# Patient Record
Sex: Female | Born: 1945
Health system: Southern US, Community
[De-identification: ages and names within clinical notes are randomized; demographics above are authoritative.]

## PROBLEM LIST (undated history)

## (undated) DIAGNOSIS — J449 Chronic obstructive pulmonary disease, unspecified: Secondary | ICD-10-CM

## (undated) DIAGNOSIS — E7800 Pure hypercholesterolemia, unspecified: Secondary | ICD-10-CM

## (undated) DIAGNOSIS — M797 Fibromyalgia: Secondary | ICD-10-CM

## (undated) DIAGNOSIS — J84112 Idiopathic pulmonary fibrosis: Secondary | ICD-10-CM

## (undated) DIAGNOSIS — T8859XA Other complications of anesthesia, initial encounter: Secondary | ICD-10-CM

## (undated) DIAGNOSIS — I639 Cerebral infarction, unspecified: Secondary | ICD-10-CM

## (undated) DIAGNOSIS — K5790 Diverticulosis of intestine, part unspecified, without perforation or abscess without bleeding: Secondary | ICD-10-CM

## (undated) DIAGNOSIS — R011 Cardiac murmur, unspecified: Secondary | ICD-10-CM

## (undated) DIAGNOSIS — T4145XA Adverse effect of unspecified anesthetic, initial encounter: Secondary | ICD-10-CM

## (undated) DIAGNOSIS — H269 Unspecified cataract: Secondary | ICD-10-CM

## (undated) DIAGNOSIS — F32A Depression, unspecified: Secondary | ICD-10-CM

## (undated) DIAGNOSIS — H409 Unspecified glaucoma: Secondary | ICD-10-CM

## (undated) DIAGNOSIS — Z5189 Encounter for other specified aftercare: Secondary | ICD-10-CM

## (undated) DIAGNOSIS — IMO0002 Reserved for concepts with insufficient information to code with codable children: Secondary | ICD-10-CM

## (undated) DIAGNOSIS — I10 Essential (primary) hypertension: Secondary | ICD-10-CM

## (undated) DIAGNOSIS — M1990 Unspecified osteoarthritis, unspecified site: Secondary | ICD-10-CM

## (undated) DIAGNOSIS — N189 Chronic kidney disease, unspecified: Secondary | ICD-10-CM

## (undated) DIAGNOSIS — IMO0001 Reserved for inherently not codable concepts without codable children: Secondary | ICD-10-CM

## (undated) DIAGNOSIS — K224 Dyskinesia of esophagus: Secondary | ICD-10-CM

## (undated) DIAGNOSIS — K219 Gastro-esophageal reflux disease without esophagitis: Secondary | ICD-10-CM

## (undated) DIAGNOSIS — B029 Zoster without complications: Secondary | ICD-10-CM

## (undated) DIAGNOSIS — F329 Major depressive disorder, single episode, unspecified: Secondary | ICD-10-CM

## (undated) DIAGNOSIS — T7840XA Allergy, unspecified, initial encounter: Secondary | ICD-10-CM

## (undated) DIAGNOSIS — F419 Anxiety disorder, unspecified: Secondary | ICD-10-CM

## (undated) DIAGNOSIS — K5792 Diverticulitis of intestine, part unspecified, without perforation or abscess without bleeding: Secondary | ICD-10-CM

## (undated) DIAGNOSIS — G4730 Sleep apnea, unspecified: Secondary | ICD-10-CM

## (undated) DIAGNOSIS — Z148 Genetic carrier of other disease: Secondary | ICD-10-CM

## (undated) DIAGNOSIS — D649 Anemia, unspecified: Secondary | ICD-10-CM

## (undated) DIAGNOSIS — R799 Abnormal finding of blood chemistry, unspecified: Secondary | ICD-10-CM

## (undated) DIAGNOSIS — J45909 Unspecified asthma, uncomplicated: Secondary | ICD-10-CM

## (undated) HISTORY — DX: Pure hypercholesterolemia, unspecified: E78.00

## (undated) HISTORY — DX: Reserved for concepts with insufficient information to code with codable children: IMO0002

## (undated) HISTORY — DX: Dyskinesia of esophagus: K22.4

## (undated) HISTORY — PX: GASTRIC FUNDOPLICATION: SHX226

## (undated) HISTORY — DX: Fibromyalgia: M79.7

## (undated) HISTORY — DX: Diverticulosis of intestine, part unspecified, without perforation or abscess without bleeding: K57.90

## (undated) HISTORY — PX: TONSILLECTOMY: SUR1361

## (undated) HISTORY — DX: Encounter for other specified aftercare: Z51.89

## (undated) HISTORY — PX: UPPER GASTROINTESTINAL ENDOSCOPY: SHX188

## (undated) HISTORY — DX: Allergy, unspecified, initial encounter: T78.40XA

## (undated) HISTORY — DX: Gastro-esophageal reflux disease without esophagitis: K21.9

## (undated) HISTORY — DX: Chronic obstructive pulmonary disease, unspecified: J44.9

## (undated) HISTORY — DX: Depression, unspecified: F32.A

## (undated) HISTORY — DX: Chronic kidney disease, unspecified: N18.9

## (undated) HISTORY — DX: Cardiac murmur, unspecified: R01.1

## (undated) HISTORY — DX: Anemia, unspecified: D64.9

## (undated) HISTORY — PX: COLONOSCOPY: SHX174

## (undated) HISTORY — DX: Idiopathic pulmonary fibrosis: J84.112

## (undated) HISTORY — DX: Anxiety disorder, unspecified: F41.9

## (undated) HISTORY — PX: DE QUERVAIN'S RELEASE: SHX1439

## (undated) HISTORY — PX: ANTERIOR FUSION CERVICAL SPINE: SUR626

## (undated) HISTORY — DX: Unspecified cataract: H26.9

## (undated) HISTORY — DX: Unspecified glaucoma: H40.9

## (undated) HISTORY — PX: BACK SURGERY: SHX140

## (undated) HISTORY — PX: KNEE ARTHROSCOPY: SHX127

## (undated) HISTORY — DX: Major depressive disorder, single episode, unspecified: F32.9

## (undated) HISTORY — PX: HERNIA REPAIR: SHX51

## (undated) HISTORY — PX: CARPAL TUNNEL RELEASE: SHX101

## (undated) HISTORY — DX: Diverticulitis of intestine, part unspecified, without perforation or abscess without bleeding: K57.92

## (undated) HISTORY — PX: ABDOMINAL HYSTERECTOMY: SHX81

## (undated) HISTORY — DX: Unspecified asthma, uncomplicated: J45.909

## (undated) HISTORY — PX: APPENDECTOMY: SHX54

## (undated) HISTORY — DX: Unspecified osteoarthritis, unspecified site: M19.90

---

## 1998-06-24 ENCOUNTER — Ambulatory Visit: Admission: RE | Admit: 1998-06-24 | Discharge: 1998-06-24 | Payer: Self-pay | Admitting: Family Medicine

## 1998-07-27 ENCOUNTER — Ambulatory Visit: Admission: RE | Admit: 1998-07-27 | Discharge: 1998-07-27 | Payer: Self-pay | Admitting: Otolaryngology

## 1998-08-07 ENCOUNTER — Encounter: Payer: Self-pay | Admitting: Internal Medicine

## 1998-08-07 ENCOUNTER — Ambulatory Visit (HOSPITAL_COMMUNITY): Admission: RE | Admit: 1998-08-07 | Discharge: 1998-08-07 | Payer: Self-pay | Admitting: Internal Medicine

## 1998-09-22 ENCOUNTER — Ambulatory Visit (HOSPITAL_COMMUNITY): Admission: RE | Admit: 1998-09-22 | Discharge: 1998-09-22 | Payer: Self-pay | Admitting: Internal Medicine

## 1999-03-23 ENCOUNTER — Encounter: Payer: Self-pay | Admitting: General Surgery

## 1999-03-26 ENCOUNTER — Inpatient Hospital Stay (HOSPITAL_COMMUNITY): Admission: RE | Admit: 1999-03-26 | Discharge: 1999-03-28 | Payer: Self-pay | Admitting: General Surgery

## 1999-04-07 ENCOUNTER — Other Ambulatory Visit: Admission: RE | Admit: 1999-04-07 | Discharge: 1999-04-07 | Payer: Self-pay | Admitting: Obstetrics and Gynecology

## 1999-08-05 ENCOUNTER — Ambulatory Visit (HOSPITAL_BASED_OUTPATIENT_CLINIC_OR_DEPARTMENT_OTHER): Admission: RE | Admit: 1999-08-05 | Discharge: 1999-08-05 | Payer: Self-pay | Admitting: Oral Surgery

## 2000-01-21 ENCOUNTER — Ambulatory Visit (HOSPITAL_COMMUNITY): Admission: RE | Admit: 2000-01-21 | Discharge: 2000-01-21 | Payer: Self-pay | Admitting: Gastroenterology

## 2000-01-21 ENCOUNTER — Encounter: Payer: Self-pay | Admitting: Gastroenterology

## 2000-05-05 ENCOUNTER — Ambulatory Visit (HOSPITAL_COMMUNITY): Admission: RE | Admit: 2000-05-05 | Discharge: 2000-05-05 | Payer: Self-pay | Admitting: Internal Medicine

## 2000-08-04 ENCOUNTER — Encounter: Admission: RE | Admit: 2000-08-04 | Discharge: 2000-08-04 | Payer: Self-pay | Admitting: Obstetrics and Gynecology

## 2000-08-04 ENCOUNTER — Encounter: Payer: Self-pay | Admitting: Obstetrics and Gynecology

## 2000-08-14 ENCOUNTER — Other Ambulatory Visit: Admission: RE | Admit: 2000-08-14 | Discharge: 2000-08-14 | Payer: Self-pay | Admitting: Obstetrics and Gynecology

## 2000-08-17 ENCOUNTER — Encounter: Admission: RE | Admit: 2000-08-17 | Discharge: 2000-08-17 | Payer: Self-pay | Admitting: Obstetrics and Gynecology

## 2000-08-17 ENCOUNTER — Encounter: Payer: Self-pay | Admitting: Obstetrics and Gynecology

## 2000-09-21 ENCOUNTER — Other Ambulatory Visit: Admission: RE | Admit: 2000-09-21 | Discharge: 2000-09-21 | Payer: Self-pay | Admitting: Obstetrics and Gynecology

## 2000-09-21 ENCOUNTER — Encounter (INDEPENDENT_AMBULATORY_CARE_PROVIDER_SITE_OTHER): Payer: Self-pay

## 2001-06-13 ENCOUNTER — Encounter: Payer: Self-pay | Admitting: Neurosurgery

## 2001-06-13 ENCOUNTER — Encounter: Admission: RE | Admit: 2001-06-13 | Discharge: 2001-06-13 | Payer: Self-pay | Admitting: Neurosurgery

## 2001-06-27 ENCOUNTER — Encounter: Payer: Self-pay | Admitting: Neurosurgery

## 2001-06-27 ENCOUNTER — Encounter: Admission: RE | Admit: 2001-06-27 | Discharge: 2001-06-27 | Payer: Self-pay | Admitting: Neurosurgery

## 2001-07-11 ENCOUNTER — Encounter: Payer: Self-pay | Admitting: Neurosurgery

## 2001-07-11 ENCOUNTER — Encounter: Admission: RE | Admit: 2001-07-11 | Discharge: 2001-07-11 | Payer: Self-pay | Admitting: Neurosurgery

## 2001-09-13 ENCOUNTER — Encounter: Admission: RE | Admit: 2001-09-13 | Discharge: 2001-09-13 | Payer: Self-pay | Admitting: Infectious Diseases

## 2001-10-30 ENCOUNTER — Other Ambulatory Visit: Admission: RE | Admit: 2001-10-30 | Discharge: 2001-10-30 | Payer: Self-pay | Admitting: Obstetrics and Gynecology

## 2002-01-01 ENCOUNTER — Encounter: Admission: RE | Admit: 2002-01-01 | Discharge: 2002-01-01 | Payer: Self-pay | Admitting: Otolaryngology

## 2002-01-01 ENCOUNTER — Encounter: Payer: Self-pay | Admitting: Otolaryngology

## 2002-03-25 ENCOUNTER — Encounter: Payer: Self-pay | Admitting: Neurosurgery

## 2002-03-28 ENCOUNTER — Ambulatory Visit (HOSPITAL_COMMUNITY): Admission: RE | Admit: 2002-03-28 | Discharge: 2002-03-28 | Payer: Self-pay | Admitting: Neurosurgery

## 2002-03-28 ENCOUNTER — Encounter: Payer: Self-pay | Admitting: Neurosurgery

## 2002-05-06 ENCOUNTER — Encounter: Payer: Self-pay | Admitting: Neurosurgery

## 2002-05-06 ENCOUNTER — Encounter: Admission: RE | Admit: 2002-05-06 | Discharge: 2002-05-06 | Payer: Self-pay | Admitting: Neurosurgery

## 2002-06-03 ENCOUNTER — Encounter: Admission: RE | Admit: 2002-06-03 | Discharge: 2002-06-03 | Payer: Self-pay | Admitting: Neurosurgery

## 2002-06-03 ENCOUNTER — Encounter: Payer: Self-pay | Admitting: Neurosurgery

## 2002-09-16 ENCOUNTER — Encounter: Payer: Self-pay | Admitting: Neurosurgery

## 2002-09-16 ENCOUNTER — Encounter: Admission: RE | Admit: 2002-09-16 | Discharge: 2002-09-16 | Payer: Self-pay | Admitting: Neurosurgery

## 2003-01-01 ENCOUNTER — Other Ambulatory Visit: Admission: RE | Admit: 2003-01-01 | Discharge: 2003-01-01 | Payer: Self-pay | Admitting: Obstetrics and Gynecology

## 2003-02-10 ENCOUNTER — Encounter: Payer: Self-pay | Admitting: Emergency Medicine

## 2003-02-10 ENCOUNTER — Emergency Department (HOSPITAL_COMMUNITY): Admission: EM | Admit: 2003-02-10 | Discharge: 2003-02-10 | Payer: Self-pay

## 2003-09-19 ENCOUNTER — Emergency Department (HOSPITAL_COMMUNITY): Admission: EM | Admit: 2003-09-19 | Discharge: 2003-09-20 | Payer: Self-pay | Admitting: Emergency Medicine

## 2004-01-09 ENCOUNTER — Encounter: Admission: RE | Admit: 2004-01-09 | Discharge: 2004-01-09 | Payer: Self-pay | Admitting: Obstetrics and Gynecology

## 2005-04-22 ENCOUNTER — Other Ambulatory Visit: Admission: RE | Admit: 2005-04-22 | Discharge: 2005-04-22 | Payer: Self-pay | Admitting: Obstetrics and Gynecology

## 2005-04-28 ENCOUNTER — Encounter: Admission: RE | Admit: 2005-04-28 | Discharge: 2005-04-28 | Payer: Self-pay | Admitting: Obstetrics and Gynecology

## 2005-05-31 ENCOUNTER — Ambulatory Visit (HOSPITAL_BASED_OUTPATIENT_CLINIC_OR_DEPARTMENT_OTHER): Admission: RE | Admit: 2005-05-31 | Discharge: 2005-05-31 | Payer: Self-pay | Admitting: Urology

## 2005-05-31 ENCOUNTER — Ambulatory Visit (HOSPITAL_COMMUNITY): Admission: RE | Admit: 2005-05-31 | Discharge: 2005-05-31 | Payer: Self-pay | Admitting: Urology

## 2006-03-01 ENCOUNTER — Encounter: Admission: RE | Admit: 2006-03-01 | Discharge: 2006-03-01 | Payer: Self-pay | Admitting: General Surgery

## 2006-03-01 DIAGNOSIS — K224 Dyskinesia of esophagus: Secondary | ICD-10-CM

## 2006-03-01 HISTORY — DX: Dyskinesia of esophagus: K22.4

## 2006-03-13 ENCOUNTER — Ambulatory Visit (HOSPITAL_COMMUNITY): Admission: RE | Admit: 2006-03-13 | Discharge: 2006-03-13 | Payer: Self-pay | Admitting: Internal Medicine

## 2006-03-27 ENCOUNTER — Encounter: Admission: RE | Admit: 2006-03-27 | Discharge: 2006-03-27 | Payer: Self-pay | Admitting: General Surgery

## 2006-04-04 ENCOUNTER — Ambulatory Visit: Payer: Self-pay | Admitting: Internal Medicine

## 2006-05-11 ENCOUNTER — Ambulatory Visit (HOSPITAL_COMMUNITY): Admission: RE | Admit: 2006-05-11 | Discharge: 2006-05-11 | Payer: Self-pay | Admitting: General Surgery

## 2007-11-05 ENCOUNTER — Emergency Department (HOSPITAL_COMMUNITY): Admission: EM | Admit: 2007-11-05 | Discharge: 2007-11-05 | Payer: Self-pay | Admitting: *Deleted

## 2007-12-26 ENCOUNTER — Emergency Department (HOSPITAL_COMMUNITY): Admission: EM | Admit: 2007-12-26 | Discharge: 2007-12-26 | Payer: Self-pay | Admitting: Emergency Medicine

## 2008-11-14 DIAGNOSIS — I639 Cerebral infarction, unspecified: Secondary | ICD-10-CM

## 2008-11-14 HISTORY — DX: Cerebral infarction, unspecified: I63.9

## 2010-05-19 ENCOUNTER — Encounter (INDEPENDENT_AMBULATORY_CARE_PROVIDER_SITE_OTHER): Payer: Self-pay | Admitting: *Deleted

## 2010-11-17 ENCOUNTER — Emergency Department (HOSPITAL_COMMUNITY)
Admission: EM | Admit: 2010-11-17 | Discharge: 2010-11-17 | Payer: Self-pay | Source: Home / Self Care | Admitting: Emergency Medicine

## 2010-11-17 LAB — DIFFERENTIAL
Basophils Absolute: 0.1 10*3/uL (ref 0.0–0.1)
Basophils Relative: 1 % (ref 0–1)
Eosinophils Absolute: 0.3 10*3/uL (ref 0.0–0.7)
Eosinophils Relative: 3 % (ref 0–5)
Lymphocytes Relative: 39 % (ref 12–46)
Lymphs Abs: 3.6 10*3/uL (ref 0.7–4.0)
Monocytes Absolute: 0.7 10*3/uL (ref 0.1–1.0)
Monocytes Relative: 8 % (ref 3–12)
Neutro Abs: 4.6 10*3/uL (ref 1.7–7.7)
Neutrophils Relative %: 49 % (ref 43–77)

## 2010-11-17 LAB — COMPREHENSIVE METABOLIC PANEL
ALT: 19 U/L (ref 0–35)
AST: 20 U/L (ref 0–37)
Albumin: 3.7 g/dL (ref 3.5–5.2)
Alkaline Phosphatase: 100 U/L (ref 39–117)
BUN: 12 mg/dL (ref 6–23)
CO2: 25 mEq/L (ref 19–32)
Calcium: 9.3 mg/dL (ref 8.4–10.5)
Chloride: 111 mEq/L (ref 96–112)
Creatinine, Ser: 0.83 mg/dL (ref 0.4–1.2)
GFR calc Af Amer: >60 mL/min (ref >60)
GFR calc non Af Amer: >60 mL/min (ref >60)
Glucose, Bld: 128 mg/dL — ABNORMAL HIGH (ref 70–99)
Potassium: 3.9 mEq/L (ref 3.5–5.1)
Sodium: 142 mEq/L (ref 135–145)
Total Bilirubin: 0.2 mg/dL — ABNORMAL LOW (ref 0.3–1.2)
Total Protein: 6.6 g/dL (ref 6.0–8.3)

## 2010-11-17 LAB — CBC
HCT: 37.4 % (ref 36.0–46.0)
Hemoglobin: 12.3 g/dL (ref 12.0–15.0)
MCH: 28.8 pg (ref 26.0–34.0)
MCHC: 32.9 g/dL (ref 30.0–36.0)
MCV: 87.6 fL (ref 78.0–100.0)
Platelets: 298 10*3/uL (ref 150–400)
RBC: 4.27 MIL/uL (ref 3.87–5.11)
RDW: 14.4 % (ref 11.5–15.5)
WBC: 9.2 10*3/uL (ref 4.0–10.5)

## 2010-12-14 NOTE — Letter (Signed)
Summary: Colonoscopy Letter  [facility]  [address] [place], [place] [address]   Phone: [phone]  Fax: [phone]      May 19, 2010 MRN: [id number]   FIORA WEILL [address] [place], [place]  [address]   Dear Ms. Akhavan,   According to your medical record, it is time for you to schedule a Colonoscopy. The American Cancer Society recommends this procedure as a method to detect early colon cancer. Patients with a family history of colon cancer, or a personal history of colon polyps or inflammatory bowel disease are at increased risk.  This letter has beeen generated based on the recommendations made at the time of your procedure. If you feel that in your particular situation this may no longer apply, please contact our office.  Please call our office at [phone] to schedule this appointment or to update your records at your earliest convenience.  Thank you for cooperating with [place] to provide you with the very best care possible.   Sincerely,  Hedwig Morton. Juanda Chance, M.D.  [facility] [phone]

## 2011-04-01 NOTE — Op Note (Signed)
NAME:  Angela [name], Angela [name]                  ACCOUNT NO.:  [id number]   MEDICAL RECORD NO.:  [id number]          PATIENT TYPE:  AMB   LOCATION:  [facility]                         FACILITY:  [facility]   PHYSICIAN:  Excell Seltzer. Annabell Howells, M.D.    DATE OF BIRTH:  August 10, 1946   DATE OF PROCEDURE:  05/31/2005  DATE OF DISCHARGE:                                 OPERATIVE REPORT   PROCEDURE:  Cystoscopy, bilateral retrograde pyelograms with interpretation.   PREOPERATIVE DIAGNOSIS:  Hematuria.   POSTOPERATIVE DIAGNOSIS:  Hematuria.   SURGEON:  Excell Seltzer. Annabell Howells, M.D.   ANESTHESIA:  MAC.   COMPLICATIONS:  None.   INDICATIONS FOR PROCEDURE:  [name] is a 65 year old white female with  intermittent gross hematuria and a contrast allergy CT urogram revealed no  abnormalities and she is to undergo cysto and retrograde pyelography.   FINDINGS AND PROCEDURE:  The patient was given p.o. Cipro, she was taken to  the operating room where she was placed in lithotomy position. IV sedation  was given, she was prepped with Hibiclens and draped in the usual sterile  fashion. Cystoscopy was performed using the 22 [place] scope and the 12 and  70 degree lenses. The urethral meatus was a bit tight but the scope would  pass. Examination revealed mild trabeculation without tumor, stones or  inflammation. The ureteral orifices were unremarkable, urethra is otherwise  unremarkable.   Retrograde pyelography was performed bilaterally using a 5 [place] open end  catheter, contrast instilled in a retrograde fashion on the left revealed a  normal ureter and internal collecting system. Contrast instilled on the  right revealed a normal ureter and internal collecting system. Both systems  drained promptly upon removal of the catheters.   The bladder was drained, the cystoscope was removed, the patient was taken  down from lithotomy position. She was moved to the recovery room in stable  condition and there were no complications.       JJW/MEDQ  D:  05/31/2005  T:  05/31/2005  Job:  [id number]   cc:   Guy Sandifer. Henderson Cloud, M.D.  [address]  [place]  [place] [address]  Fax: [phone]

## 2011-04-01 NOTE — Op Note (Signed)
NAME:  Angela [name], Angela [name]                  ACCOUNT NO.:  [id number]   MEDICAL RECORD NO.:  [id number]          PATIENT TYPE:  AMB   LOCATION:  DAY                          FACILITY:  [facility]   PHYSICIAN:  Adolph Pollack, M.D.DATE OF BIRTH:  [birth year]   DATE OF PROCEDURE:  05/11/2006  DATE OF DISCHARGE:                                 OPERATIVE REPORT   PREOPERATIVE DIAGNOSIS:  Ventral incisional hernia.   POSTOPERATIVE DIAGNOSIS:  Ventral incisional hernia.   PROCEDURE:  Ventral incisional hernia repair with mesh.   SURGEON:  Adolph Pollack, M.D.   ANESTHESIA:  General plus 0.5% Marcaine for local block.   INDICATIONS:  This 65 year old female underwent a laparoscopic hiatal hernia  repair and Nissen fundoplication.  She had a coughing spell and she noted a  painful bulge in and around her epigastric region.  This causes pain every  time she eats.  She has a ventral hernia by CT scan and now she presents for  repair.  The procedure, risks and aftercare were discussed preoperatively.   TECHNIQUE:  She was seen in the holding area and brought to the operating  room, placed supine on the operating table and general anesthetic was  administered.  The epigastric incision where the hernia was, was marked and  the abdominal wall was sterilely prepped and draped.  Marcaine solution was  infiltrated superficially and deep.  Previous epigastric incision was noted,  and incision made beginning superior to it, extending through it and  inferior to it.  Subcutaneous tissue was divided using electrocautery.  Also, some omentum coming out of the hernia was then noted and I dissected  this free from the subcutaneous tissue and reduced it to a fascial defect.  I then dissected the subcutaneous tissue free from the fascia, in a  circumferential fashion around the hernia.  The hernia was then primarily  closed with #1 Surgilon suture.  A piece of polypropylene mesh was brought  into the field.   The closing sutures were then threaded through the mesh and  then tied down, anchoring the mesh directly over the primary closure.  The  mesh was then trimmed and the periphery of the mesh was anchored to the  fascia with a running 2-0 Prolene suture.  This provided for adequate  coverage of the defect with adequate overlap.   Marcaine solution was then infiltrated in the fascia.  The wound was  irrigated and hemostasis was adequate.  The instrument counts and sponge  counts were correct.  The subcutaneous tissue was then closed over the mesh  with a running 2-0 Vicryl suture and the skin closed with 3-0 Monocryl  subcuticular stitch.  Steri-Strips and sterile dressings were applied.   She tolerated the procedure well, without apparent complications, and was  taken to recovery in satisfactory condition.      Adolph Pollack, M.D.  Electronically Signed     TJR/MEDQ  D:  05/11/2006  T:  05/11/2006  Job:  [id number]   cc:   Lina Sar, M.D. [facility]  [address]  [place] [address]

## 2011-04-01 NOTE — Op Note (Signed)
. [facility]  Patient:    Angela [name], Angela [name] Visit Number: [id number] MRN: [id number]          Service Type: DSU Location: 3000 [id number] Attending Physician:  Mariam Dollar Dictated by:   Garlon Hatchet., M.D. Proc. Date: 03/28/02 Admit Date:  03/28/2002 Discharge Date: 03/28/2002                             Operative Report  PREOPERATIVE DIAGNOSIS:  Cervical spondylosis with C6 and C7 radiculopathy, right greater than left.  PROCEDURE:  Anterior cervical diskectomy and fusion at C5-6 and C6-7 using 6 mm Tutogen bicortical patellar wedges and a 40 mm Atlantis plate with four 13 mm variable-angled screws.  SURGEON:  Garlon Hatchet., M.D.  ASSISTANT:  Reinaldo Meeker, M.D.  ANESTHESIA:  General endotracheal.  CLINICAL NOTE:  The patient is a very pleasant 65 year old female who has had long-standing neck and bilateral arm pain with numbness and tingling in what appeared to be a C6 and C7 distribution.  Preoperative imaging showed severe spondylosis and foraminal stenosis at C5-6 with central ruptured disk and spinal cord compression at C6-7.  The patient has failed conservative treatment with anti-inflammatories, physical therapy, and time.  The patient extensively counseled and recommended anterior cervical diskectomy and fusion. She understood and agreed to proceed forward.  DESCRIPTION OF PROCEDURE:  The patient was brought into the operating room, was induced under general anesthesia, positioned supine, the neck in slight extension with five pounds of Holter traction, and the neck was prepped and draped in the usual sterile fashion.  Preoperative x-ray localized the needle over the C6 vertebral body.  A curvilinear incision was made just off the midline to the anterior border of the sternocleidomastoid at this level with a 10 blade scalpel.  The superficial layer of the platysma was dissected out and divided longitudinally.  The avascular plane  between the sternocleidomastoid and the omohyoid and strap muscles was developed down to prevertebral fascia. The prevertebral fascia was dissected away with Kitners.  Intraoperative x-ray confirmed localization at the C5-6 disk space.  The longus colli was then reflected laterally and a self-retaining retractor was placed.  Annulotomies were made with 11 blade scalpel.  Pituitary rongeurs were used to remove the anterior margin of the annulus.  There were very large anterior osteophytes that were overbitten with the Leksell rongeur, and then the disk space was curetted with a BA curette and a high-speed drill with a blue 8 drill bit was used to drill down the end plates to the posterior vertebral body, posterior longitudinal ligament, and osteophytes.  Then using a 1 and 2 mm Kerrison punch, the large osteophyte at C5-6 coming off the C6 vertebral body was underbitten, exposing the posterior longitudinal ligament.  The posterior longitudinal ligament was then removed in piecemeal fashion, exposing the thecal sac, and this was decompressed radically out both C6 neural foramina. There was a very large amount of uncinate hypertrophy compressing especially the right C6 nerve root.  This was all removed in piecemeal fashion until both neural foramina were widely decompressed.  The thecal sac was decompressed. The end plates were then prepared to receive bone graft, and Gelfoam was placed.  Attention was then taken to the C6-7 disk space.  Again the high-speed drill with a blue 8 drill bit was used to drill down the anterior margins of the down to the posterior annulus and  posterior longitudinal ligament.  Then using 1 and 2 mm Kerrison punch, the remainder of the osteophyte was removed.  There was very thickened posterior longitudinal ligament, and large fragments of disk were teased out that had migrated subligamentous centrally.  Then this ligament was removed.  The ligament was noted to be  very stuck to the dura, so there were some small leaflets coming off the end plates that were left behind as they were noted to be fused into the dura.  Both C7 neural foramina were widely decompressed out their foramen and explored with an angled nerve hook and noted to have no further stenosis. Then the end plates were then prepared with the BA curette and using a vertebral body distractor, two 6 mm Tutogen bicortical patellar wedges were sized, selected, fashioned with 2 mm cut off the depth, and inserted approximately 1 mm deep to the anterior vertebral body line.  Then a 40 mm Atlantis plate was sized, selected, and inserted, and six 13 mm variable-angled screws were drilled, tapped, and inserted with excellent purchase in all screws.  Set screws were tightened.  A postop x-ray confirmed good placement of the screws, plate, and bone grafts.  Then the wound was copiously irrigated and meticulous hemostasis was maintained.  The platysma was reapproximated with 3-0 interrupted Vicryl, the skin was closed with running 4-0 subcuticular.  Benzoin and Steri-Strips were applied.  The patient went to the recovery room in stable condition.  At the end of the case, all needle counts and sponge counts were correct. Dictated by:   Garlon Hatchet., M.D. Attending Physician:  Mariam Dollar DD:  03/28/02 TD:  03/29/02 Job: [id number]

## 2011-04-01 NOTE — Op Note (Signed)
NAME:  Angela [name], Angela [name]                  ACCOUNT NO.:  [id number]   MEDICAL RECORD NO.:  [id number]          PATIENT TYPE:  AMB   LOCATION:  ENDO                         FACILITY:  [facility]   PHYSICIAN:  Lina Sar, M.D. [facility]  DATE OF BIRTH:  1946/06/27   DATE OF PROCEDURE:  DATE OF DISCHARGE:  03/13/2006                                 OPERATIVE REPORT   PROCEDURE:  24-hour pH probe study.   INDICATIONS:  This 65 year old white female has had a history of chronic  gastroesophageal reflux disease.  Previous pH probe in 2000, was positive  for acid reflux.  At that time, she declined having reflux surgery.  She now  has had recurrent symptoms and has been on Zantac 300 mg p.o. b.i.d.  Medication was discontinued 24 hours prior to this study.   RESULTS:  Lower esophageal sphincter was identified at 38 cm from the  incisors.  Two-channel probe was placed, one at the lower esophageal  sphincter, one at 5 cm above the esophageal sphincter.  In the proximal  channel, pH was recorded less than 4 only 0.2% of the time in upright  position and never in reclining position.  The patient had only eight reflux  episodes.  All occurred in upright position.  The longest lasted 1 minute.  In the distal channel, the patient had a total of two reflux episodes.  Both  occurred in upright position.  All of them lasted less than 1 minute.  Total  channel composite score was 0.9, normal being less than 22.  This was in the  distal channel.   The symptom analysis showed that the patient had a total of 11 events  recorded, a few of them with heartburn, 4 after coughing, 11 with belching,  but these episodes did not correspond with actual reflux episodes.   IMPRESSION:  This is a negative 24-hour interesophageal pH probe study  showing no significant acid reflux over the 24-hour period which was  recorded in the study.  This was done off the patient's medications for 24  hours prior to the study.      Lina Sar, M.D. [facility]  Electronically Signed     DB/MEDQ  D:  03/16/2006  T:  03/17/2006  Job:  [id number]   cc:   Adolph Pollack, M.D.  [address]  [place]  [place] [address]

## 2011-04-01 NOTE — Procedures (Signed)
[facility]  Patient:    Angela [name], Angela [name]                         MRN: [id number] Proc. Date: 05/05/00 Adm. Date:  [id number] Disc. Date: [id number] Attending:  Mervin Hack CC:         Fayne Norrie, M.D.                           Procedure Report  PROCEDURE:  Colonoscopy.  ENDOSCOPIST:  Hedwig Morton. Juanda Chance, M.D.  INDICATIONS:  This 65 year old white female has had abdominal pain associated with intermittent diarrhea, and left upper quadrant abdominal pain. She has also has a ventral hernia. She has been evaluated with CT scan of the abdomen. She has had several episodes of rectal bleeding. It has not been clear as to whether this represents irritable bowel syndrome, inflammatory bowel disease and therefore she is undergoing further evaluation with colonoscopy.  ENDOSCOPE:  Olympus single-channel endoscope.  SEDATION: Versed 5 mg, IV Demerol 100 mg.  FINDINGS:  The Olympus single-channel video endoscope was passed routinely through the rectum to the sigmoid colon. The patient was monitored by pulse oximetry. Her oxygen saturations were normal. Her prep was adequate. The rectal ampulla was unremarkable. I could not appreciate any hemorrhoids. There were scattered diverticuli through the sigmoid colon. At least 20 or 30 diverticuli were noted in the sigmoid area. Some of them were rather large and deep and some were shallow. There was mild hypertrophy, but there was no significant obstruction or narrowing of the colon. The descending colon was normal and there were no diverticuli further up in the splenic flexure or transverse colon. The area of the splenic flexure was unremarkable. There were no ischemic changes. No extrinsic compression that would explain the patients discomfort. Hepatic flexure and ascending colon were traversed without difficulty and showed normal mucosa. The cecal pouch was distended with normal appearing ileocecal valve  and appendiceal opening. Video photographs were obtained. The colonoscope was then retracted. The colon was decompressed. The patient tolerated the procedure well.  IMPRESSION:  Mild diverticulosis of the left colon.  PLAN:  The patients symptoms are nonspecific and do not represent inflammatory bowel disease. She will be treated for irritable bowel syndrome using long-term antispasmodic, Levbid 0.375 mg p.o. b.i.d. Also, she will be started on a regular dose of Fibercon 2 tablets a day and reassessed in four to six weeks. If, after this period of time, she is not improved, and if some of the symptoms are still suggestive of symptomatic ventral hernia, she will be referred to a Careers adviser. DD:  05/05/00 TD:  05/08/00 Job: [id number]

## 2011-06-21 ENCOUNTER — Telehealth: Payer: Self-pay | Admitting: *Deleted

## 2011-06-21 NOTE — Telephone Encounter (Signed)
Patient's last colonoscopy was completed 05/05/00 and showed diverticulosis. Patient is overdue for follow up. I have attempted to reach patient at home number provided but there is no answer and no voicemail. I have also attempted to contact patient on mobile number provided in system but this number a nonworking number. I will attempt to call back at a later date.

## 2011-06-29 NOTE — Telephone Encounter (Signed)
I have again attempted to contact the patient. There is no answer and no voicemail. We will send a letter to patient.

## 2011-08-05 LAB — CBC
HCT: 42.1
Hemoglobin: 14.2
MCHC: 33.8
MCV: 87.5
Platelets: 261
RBC: 4.81
RDW: 13.2
WBC: 9.2

## 2011-08-05 LAB — I-STAT 8, (EC8 V) (CONVERTED LAB)
Bicarbonate: 24.7 — ABNORMAL HIGH
Glucose, Bld: 102 — ABNORMAL HIGH
TCO2: 26
pCO2, Ven: 40.2 — ABNORMAL LOW
pH, Ven: 7.396 — ABNORMAL HIGH

## 2011-08-05 LAB — POCT CARDIAC MARKERS
CKMB, poc: <1 — ABNORMAL LOW
Myoglobin, poc: 46.2
Myoglobin, poc: 65
Operator id: 146091

## 2011-08-05 LAB — DIFFERENTIAL
Basophils Relative: 1
Eosinophils Absolute: 0.2
Monocytes Absolute: 0.8
Monocytes Relative: 8
Neutrophils Relative %: 47

## 2011-08-19 LAB — I-STAT 8, (EC8 V) (CONVERTED LAB)
Acid-base deficit: 1
Chloride: 110
Glucose, Bld: 112 — ABNORMAL HIGH
Hemoglobin: 13.9
Potassium: 4
pH, Ven: 7.378 — ABNORMAL HIGH

## 2011-08-19 LAB — POCT I-STAT CREATININE: Creatinine, Ser: 0.8

## 2012-05-08 ENCOUNTER — Encounter (HOSPITAL_COMMUNITY): Payer: Self-pay | Admitting: *Deleted

## 2012-05-08 ENCOUNTER — Emergency Department (HOSPITAL_COMMUNITY): Payer: Self-pay

## 2012-05-08 ENCOUNTER — Observation Stay (HOSPITAL_COMMUNITY)
Admission: EM | Admit: 2012-05-08 | Discharge: 2012-05-08 | Disposition: A | Discharge status: Home / Self Care | Payer: Self-pay | Source: Ambulatory Visit | Attending: Emergency Medicine | Admitting: Emergency Medicine

## 2012-05-08 DIAGNOSIS — I10 Essential (primary) hypertension: Secondary | ICD-10-CM | POA: Insufficient documentation

## 2012-05-08 DIAGNOSIS — E119 Type 2 diabetes mellitus without complications: Secondary | ICD-10-CM | POA: Insufficient documentation

## 2012-05-08 DIAGNOSIS — F17200 Nicotine dependence, unspecified, uncomplicated: Secondary | ICD-10-CM | POA: Insufficient documentation

## 2012-05-08 DIAGNOSIS — J45909 Unspecified asthma, uncomplicated: Secondary | ICD-10-CM

## 2012-05-08 DIAGNOSIS — J40 Bronchitis, not specified as acute or chronic: Principal | ICD-10-CM | POA: Insufficient documentation

## 2012-05-08 DIAGNOSIS — G40909 Epilepsy, unspecified, not intractable, without status epilepticus: Secondary | ICD-10-CM | POA: Insufficient documentation

## 2012-05-08 HISTORY — DX: Essential (primary) hypertension: I10

## 2012-05-08 LAB — CBC
MCV: 88.1 fL (ref 78.0–100.0)
Platelets: 253 10*3/uL (ref 150–400)
RBC: 4.27 MIL/uL (ref 3.87–5.11)
WBC: 15.6 10*3/uL — ABNORMAL HIGH (ref 4.0–10.5)

## 2012-05-08 LAB — BASIC METABOLIC PANEL
CO2: 23 mEq/L (ref 19–32)
Calcium: 9.3 mg/dL (ref 8.4–10.5)
GFR calc Af Amer: 74 mL/min — ABNORMAL LOW (ref >90)
Sodium: 136 mEq/L (ref 135–145)

## 2012-05-08 LAB — URINALYSIS, ROUTINE W REFLEX MICROSCOPIC
Bilirubin Urine: NEGATIVE
Leukocytes, UA: NEGATIVE
Nitrite: NEGATIVE
Specific Gravity, Urine: 1.01 (ref 1.005–1.030)
pH: 6 (ref 5.0–8.0)

## 2012-05-08 MED ORDER — PREDNISONE 20 MG PO TABS
60.0000 mg | ORAL_TABLET | Freq: Every day | ORAL | Status: DC
  Administered 2012-05-08: 60 mg via ORAL
  Filled 2012-05-08: qty 3

## 2012-05-08 MED ORDER — PREDNISONE 20 MG PO TABS: 40.0000 mg | ORAL_TABLET | Freq: Every day | ORAL | Status: DC

## 2012-05-08 MED ORDER — AZITHROMYCIN 250 MG PO TABS
500.0000 mg | ORAL_TABLET | Freq: Once | ORAL | Status: AC
  Administered 2012-05-08: 500 mg via ORAL
  Filled 2012-05-08: qty 2

## 2012-05-08 MED ORDER — AZITHROMYCIN 250 MG PO TABS: 250.0000 mg | ORAL_TABLET | Freq: Every day | ORAL | Status: AC

## 2012-05-08 MED ORDER — LEVALBUTEROL HCL 1.25 MG/0.5ML IN NEBU
1.2500 mg | INHALATION_SOLUTION | Freq: Once | RESPIRATORY_TRACT | Status: AC
  Administered 2012-05-08: 1.25 mg via RESPIRATORY_TRACT
  Filled 2012-05-08: qty 0.5

## 2012-05-08 NOTE — ED Provider Notes (Signed)
Medical screening examination/treatment/procedure(s) were conducted as a shared visit with non-physician practitioner(s) and myself.  I personally evaluated the patient during the encounter  Flint Melter, MD 05/08/12 1736

## 2012-05-08 NOTE — Discharge Instructions (Signed)
Read the information below.  Please follow up with your primary care provider.  If you develop high fevers, difficulty breathing, chest pain, or uncontrolled cough, return to the ER for a recheck.  You may return to the ER at any time for worsening condition or any new symptoms that concern you.  Bronchitis Bronchitis is the body's way of reacting to injury and/or infection (inflammation) of the bronchi. Bronchi are the air tubes that extend from the windpipe into the lungs. If the inflammation becomes severe, it may cause shortness of breath. CAUSES  Inflammation may be caused by:  A virus.   Germs (bacteria).   Dust.   Allergens.   Pollutants and many other irritants.  The cells lining the bronchial tree are covered with tiny hairs (cilia). These constantly beat upward, away from the lungs, toward the mouth. This keeps the lungs free of pollutants. When these cells become too irritated and are unable to do their job, mucus begins to develop. This causes the characteristic cough of bronchitis. The cough clears the lungs when the cilia are unable to do their job. Without either of these protective mechanisms, the mucus would settle in the lungs. Then you would develop pneumonia. Smoking is a common cause of bronchitis and can contribute to pneumonia. Stopping this habit is the single most important thing you can do to help yourself. TREATMENT   Your caregiver may prescribe an antibiotic if the cough is caused by bacteria. Also, medicines that open up your airways make it easier to breathe. Your caregiver may also recommend or prescribe an expectorant. It will loosen the mucus to be coughed up. Only take over-the-counter or prescription medicines for pain, discomfort, or fever as directed by your caregiver.   Removing whatever causes the problem (smoking, for example) is critical to preventing the problem from getting worse.   Cough suppressants may be prescribed for relief of cough symptoms.     Inhaled medicines may be prescribed to help with symptoms now and to help prevent problems from returning.   For those with recurrent (chronic) bronchitis, there may be a need for steroid medicines.  SEEK IMMEDIATE MEDICAL CARE IF:   During treatment, you develop more pus-like mucus (purulent sputum).   You have a fever.   Your baby is older than 3 months with a rectal temperature of 102 F (38.9 C) or higher.   Your baby is 21 months old or younger with a rectal temperature of 100.4 F (38 C) or higher.   You become progressively more ill.   You have increased difficulty breathing, wheezing, or shortness of breath.  It is necessary to seek immediate medical care if you are elderly or sick from any other disease. MAKE SURE YOU:   Understand these instructions.   Will watch your condition.   Will get help right away if you are not doing well or get worse.  Document Released: 10/31/2005 Document Revised: 10/20/2011 Document Reviewed: 09/09/2008 [facility] Patient Information 2012 [place], [place].

## 2012-05-08 NOTE — ED Provider Notes (Signed)
11:56 AM Pt is in CDU under observation, wheeze protocol.  This is a shared visit with Dr Effie Shy.  Pt was exposed to someone with similar symptoms last week, has had cough and fever, now with SOB.  Reports improvement with neb treatment but continues to be SOB, would like another breathing treatment.  On exam, pt is A&Ox4, NAD, RRR, CTAB, moving air tentatively but well in all fields.  O2 saturation is 98-99%. Will continue to follow.   2:07 PM Patient reports she is feeling much better after second nebulizer treatment.  Lungs CTAB.  Pt states she is ready for discharge.  Discussed smoking cessation with patient.  Pt started on zithromax and prednisone in ED, will continue at home.  Pt has nebulizer machine at home.  Discussed all results with patient. Pt will be d/c home with PCP follow up, return precautions.  Patient verbalizes understanding and agrees with plan.    Results for orders placed during the hospital encounter of 05/08/12  BASIC METABOLIC PANEL      Component Value Range   Sodium 136  135 - 145 mEq/L   Potassium 4.4  3.5 - 5.1 mEq/L   Chloride 103  96 - 112 mEq/L   CO2 23  19 - 32 mEq/L   Glucose, Bld 87  70 - 99 mg/dL   BUN 15  6 - 23 mg/dL   Creatinine, Ser 1.91  0.50 - 1.10 mg/dL   Calcium 9.3  8.4 - 47.8 mg/dL   GFR calc non Af Amer 64 (*) >90 mL/min   GFR calc Af Amer 74 (*) >90 mL/min  CBC      Component Value Range   WBC 15.6 (*) 4.0 - 10.5 K/uL   RBC 4.27  3.87 - 5.11 MIL/uL   Hemoglobin 12.4  12.0 - 15.0 g/dL   HCT 29.5  62.1 - 30.8 %   MCV 88.1  78.0 - 100.0 fL   MCH 29.0  26.0 - 34.0 pg   MCHC 33.0  30.0 - 36.0 g/dL   RDW 65.7  84.6 - 96.2 %   Platelets 253  150 - 400 K/uL  URINALYSIS, ROUTINE W REFLEX MICROSCOPIC      Component Value Range   Color, Urine YELLOW  YELLOW   APPearance CLEAR  CLEAR   Specific Gravity, Urine 1.010  1.005 - 1.030   pH 6.0  5.0 - 8.0   Glucose, UA NEGATIVE  NEGATIVE mg/dL   Hgb urine dipstick NEGATIVE  NEGATIVE   Bilirubin Urine  NEGATIVE  NEGATIVE   Ketones, ur NEGATIVE  NEGATIVE mg/dL   Protein, ur NEGATIVE  NEGATIVE mg/dL   Urobilinogen, UA 0.2  0.0 - 1.0 mg/dL   Nitrite NEGATIVE  NEGATIVE   Leukocytes, UA NEGATIVE  NEGATIVE   Dg Chest 2 View  05/08/2012  *RADIOLOGY REPORT*  Clinical Data: Cough, fever, wheezing  CHEST - 2 VIEW  Comparison: 11/17/2010  Findings: Chronic interstitial markings/emphysematous changes. Bibasilar atelectasis. No pleural effusion or pneumothorax.  Cardiomediastinal silhouette is within normal limits.  Mild degenerative changes of the thoracic  IMPRESSION: Chronic interstitial markings/emphysematous changes.  Bibasilar atelectasis.  Original Report Authenticated By: Charline Bills, M.D.      Rise Patience, [place] 05/08/12 1409

## 2012-05-08 NOTE — Progress Notes (Signed)
Observation review is complete. 

## 2012-05-08 NOTE — ED Provider Notes (Signed)
History     CSN: [id number]  Arrival date & time 05/08/12  0704   First MD Initiated Contact with Patient 05/08/12 [phone]      Chief Complaint  Patient presents with  . Cough    (Consider location/radiation/quality/duration/timing/severity/associated sxs/prior treatment) HPI Comments: Angela [name] is a 66 y.o. Female who feels like she has pneumonia, with cough, shortness of breath, and productive sputum. Her sputum is yellow color,there is no blood. She denies fever, chills, nausea, or vomiting. She has been able to eat. She has no chest pain, weakness, or dizziness. She's been using her regular medications including albuterol nebulizer, without relief. She gets out of breath with ambulation.  The history is provided by the patient.    Past Medical History  Diagnosis Date  . Diabetes mellitus   . Hypertension   . Seizures     Past Surgical History  Procedure Date  . Appendectomy   . Tonsillectomy   . Gastric fundoplication   . Back surgery     No family history on file.  History  Substance Use Topics  . Smoking status: Current Everyday Smoker  . Smokeless tobacco: Not on file  . Alcohol Use: No    OB History    Grav Para Term Preterm Abortions TAB SAB Ect Mult Living                  Review of Systems  All other systems reviewed and are negative.    Allergies  Aspirin; Codeine; Iohexol; and Ciprofloxacin  Home Medications   Current Outpatient Rx  Name Route Sig Dispense Refill  . ATORVASTATIN CALCIUM 20 MG PO TABS Oral Take 20 mg by mouth daily.    . CYCLOBENZAPRINE HCL 10 MG PO TABS Oral Take 10 mg by mouth 3 (three) times daily as needed. For muscle spasm    . METFORMIN HCL 500 MG PO TABS Oral Take 500 mg by mouth 2 (two) times daily with a meal.    . METOPROLOL SUCCINATE ER 100 MG PO TB24 Oral Take 100 mg by mouth daily. Take with or immediately following a meal.    . QUINAPRIL HCL 20 MG PO TABS Oral Take 20 mg by mouth at bedtime.    . SERTRALINE  HCL 50 MG PO TABS Oral Take 50 mg by mouth daily.    . AZITHROMYCIN 250 MG PO TABS Oral Take 1 tablet (250 mg total) by mouth daily. Start 6/26 (first dose in ER) 4 tablet 0  . PREDNISONE 20 MG PO TABS Oral Take 2 tablets (40 mg total) by mouth daily. 14 tablet 0    BP 124/56  Pulse 64  Temp 98.7 F (37.1 C) (Oral)  Resp 18  SpO2 99%  Physical Exam  Nursing note and vitals reviewed. Constitutional: She is oriented to person, place, and time. She appears well-developed and well-nourished. She appears distressed (mild).  HENT:  Head: Normocephalic and atraumatic.  Eyes: Conjunctivae and EOM are normal. Pupils are equal, round, and reactive to light.  Neck: Normal range of motion and phonation normal. Neck supple.  Cardiovascular: Normal rate, regular rhythm and intact distal pulses.   Pulmonary/Chest: She is in respiratory distress (Mild). She has wheezes (Scattered). She has no rales. She exhibits no tenderness.       Decreased expiratory air flow  Abdominal: Soft. She exhibits no distension. There is no tenderness. There is no guarding.  Musculoskeletal: Normal range of motion.  Neurological: She is alert and oriented to  person, place, and time. She has normal strength. She exhibits normal muscle tone.  Skin: Skin is warm and dry.  Psychiatric: She has a normal mood and affect. Her behavior is normal. Judgment and thought content normal.    ED Course  Procedures (including critical care time) Emergency department treatment: Nebulizer, and prednisone. She's place and wheezing, protocol in CDU  CRITICAL CARE Performed by: Flint Melter   Total critical care time: 35 minutes Critical care time was exclusive of separately billable procedures and treating other patients.  Critical care was necessary to treat or prevent imminent or life-threatening deterioration.  Critical care was time spent personally by me on the following activities: development of treatment plan with  patient and/or surrogate as well as nursing, discussions with consultants, evaluation of patient's response to treatment, examination of patient, obtaining history from patient or surrogate, ordering and performing treatments and interventions, ordering and review of laboratory studies, ordering and review of radiographic studies, pulse oximetry and re-evaluation of patient's condition.   Labs Reviewed  BASIC METABOLIC PANEL - Abnormal; Notable for the following:    GFR calc non Af Amer 64 (*)     GFR calc Af Amer 74 (*)     All other components within normal limits  CBC - Abnormal; Notable for the following:    WBC 15.6 (*)     All other components within normal limits  URINALYSIS, ROUTINE W REFLEX MICROSCOPIC   Dg Chest 2 View  05/08/2012  *RADIOLOGY REPORT*  Clinical Data: Cough, fever, wheezing  CHEST - 2 VIEW  Comparison: 11/17/2010  Findings: Chronic interstitial markings/emphysematous changes. Bibasilar atelectasis. No pleural effusion or pneumothorax.  Cardiomediastinal silhouette is within normal limits.  Mild degenerative changes of the thoracic  IMPRESSION: Chronic interstitial markings/emphysematous changes.  Bibasilar atelectasis.  Original Report Authenticated By: Charline Bills, M.D.     1. Bronchitis   2. Asthma       MDM  Bronchitis with bacterial infection. Persistent sx required aggressive ED treatment           Flint Melter, MD 05/08/12 1735

## 2012-05-08 NOTE — ED Notes (Signed)
Pt is here with lung pain and is coughing up yellow sputum.  Pt had fever on Sunday.  Pt feels the same way when she had pneumonia.  Pt has hard time breathing when she lays down

## 2012-06-08 ENCOUNTER — Encounter: Payer: Self-pay | Admitting: Internal Medicine

## 2012-11-14 DIAGNOSIS — D649 Anemia, unspecified: Secondary | ICD-10-CM

## 2012-11-14 HISTORY — DX: Anemia, unspecified: D64.9

## 2013-04-13 ENCOUNTER — Encounter (HOSPITAL_COMMUNITY): Payer: Self-pay | Admitting: *Deleted

## 2013-04-13 ENCOUNTER — Observation Stay (HOSPITAL_COMMUNITY)
Admission: EM | Admit: 2013-04-13 | Discharge: 2013-04-15 | Disposition: A | Discharge status: Home / Self Care | Payer: No Typology Code available for payment source | Attending: Internal Medicine | Admitting: Internal Medicine

## 2013-04-13 DIAGNOSIS — E1310 Other specified diabetes mellitus with ketoacidosis without coma: Secondary | ICD-10-CM | POA: Insufficient documentation

## 2013-04-13 DIAGNOSIS — R079 Chest pain, unspecified: Principal | ICD-10-CM | POA: Diagnosis present

## 2013-04-13 DIAGNOSIS — I10 Essential (primary) hypertension: Secondary | ICD-10-CM | POA: Diagnosis present

## 2013-04-13 DIAGNOSIS — E1110 Type 2 diabetes mellitus with ketoacidosis without coma: Secondary | ICD-10-CM | POA: Diagnosis present

## 2013-04-13 DIAGNOSIS — E785 Hyperlipidemia, unspecified: Secondary | ICD-10-CM | POA: Diagnosis present

## 2013-04-13 LAB — COMPREHENSIVE METABOLIC PANEL
ALT: 22 U/L (ref 0–35)
AST: 19 U/L (ref 0–37)
CO2: 21 mEq/L (ref 19–32)
Chloride: 106 mEq/L (ref 96–112)
GFR calc Af Amer: 83 mL/min — ABNORMAL LOW (ref >90)
GFR calc non Af Amer: 72 mL/min — ABNORMAL LOW (ref >90)
Glucose, Bld: 162 mg/dL — ABNORMAL HIGH (ref 70–99)
Sodium: 139 mEq/L (ref 135–145)
Total Bilirubin: 0.2 mg/dL — ABNORMAL LOW (ref 0.3–1.2)

## 2013-04-13 LAB — URINALYSIS, ROUTINE W REFLEX MICROSCOPIC
Bilirubin Urine: NEGATIVE
Glucose, UA: NEGATIVE mg/dL
Ketones, ur: NEGATIVE mg/dL
Protein, ur: NEGATIVE mg/dL
Urobilinogen, UA: 0.2 mg/dL (ref 0.0–1.0)

## 2013-04-13 LAB — CBC WITH DIFFERENTIAL/PLATELET
Basophils Absolute: 0.1 10*3/uL (ref 0.0–0.1)
Eosinophils Relative: 3 % (ref 0–5)
HCT: 33.5 % — ABNORMAL LOW (ref 36.0–46.0)
Lymphocytes Relative: 40 % (ref 12–46)
Lymphs Abs: 3.2 10*3/uL (ref 0.7–4.0)
MCV: 86.6 fL (ref 78.0–100.0)
Monocytes Absolute: 0.7 10*3/uL (ref 0.1–1.0)
Neutro Abs: 3.8 10*3/uL (ref 1.7–7.7)
Platelets: 215 10*3/uL (ref 150–400)
RBC: 3.87 MIL/uL (ref 3.87–5.11)
RDW: 13.4 % (ref 11.5–15.5)
WBC: 8 10*3/uL (ref 4.0–10.5)

## 2013-04-13 LAB — URINE MICROSCOPIC-ADD ON

## 2013-04-13 NOTE — ED Notes (Signed)
The pt is c/o lt sided chest pain since yesterday with high blood pressure no sob no dizziness.  The pain is also radiating down her lt arm

## 2013-04-13 NOTE — ED Notes (Signed)
Sons number [phone]

## 2013-04-14 ENCOUNTER — Emergency Department (HOSPITAL_COMMUNITY): Payer: No Typology Code available for payment source

## 2013-04-14 ENCOUNTER — Encounter (HOSPITAL_COMMUNITY): Payer: Self-pay | Admitting: *Deleted

## 2013-04-14 DIAGNOSIS — E785 Hyperlipidemia, unspecified: Secondary | ICD-10-CM | POA: Diagnosis present

## 2013-04-14 DIAGNOSIS — R079 Chest pain, unspecified: Secondary | ICD-10-CM

## 2013-04-14 DIAGNOSIS — E1110 Type 2 diabetes mellitus with ketoacidosis without coma: Secondary | ICD-10-CM | POA: Diagnosis present

## 2013-04-14 DIAGNOSIS — E1310 Other specified diabetes mellitus with ketoacidosis without coma: Secondary | ICD-10-CM

## 2013-04-14 DIAGNOSIS — I10 Essential (primary) hypertension: Secondary | ICD-10-CM | POA: Diagnosis present

## 2013-04-14 LAB — BASIC METABOLIC PANEL
CO2: 25 mEq/L (ref 19–32)
Calcium: 9.3 mg/dL (ref 8.4–10.5)
Creatinine, Ser: 0.85 mg/dL (ref 0.50–1.10)
Glucose, Bld: 107 mg/dL — ABNORMAL HIGH (ref 70–99)

## 2013-04-14 LAB — CBC
HCT: 33.1 % — ABNORMAL LOW (ref 36.0–46.0)
Hemoglobin: 11.2 g/dL — ABNORMAL LOW (ref 12.0–15.0)
MCH: 29.3 pg (ref 26.0–34.0)
MCHC: 33.8 g/dL (ref 30.0–36.0)
MCV: 86.6 fL (ref 78.0–100.0)
Platelets: 205 K/uL (ref 150–400)
RBC: 3.82 MIL/uL — ABNORMAL LOW (ref 3.87–5.11)
RDW: 13.4 % (ref 11.5–15.5)
WBC: 7.5 K/uL (ref 4.0–10.5)

## 2013-04-14 LAB — GLUCOSE, CAPILLARY
Glucose-Capillary: 90 mg/dL (ref 70–99)
Glucose-Capillary: 95 mg/dL (ref 70–99)
Glucose-Capillary: 113 mg/dL — ABNORMAL HIGH (ref 70–99)

## 2013-04-14 LAB — LIPID PANEL
Cholesterol: 119 mg/dL (ref 0–200)
VLDL: 25 mg/dL (ref 0–40)

## 2013-04-14 LAB — TROPONIN I: Troponin I: <0.3 ng/mL (ref <0.30)

## 2013-04-14 MED ORDER — ONDANSETRON HCL 4 MG/2ML IJ SOLN: 4.0000 mg | Freq: Four times a day (QID) | INTRAMUSCULAR | Status: DC | PRN

## 2013-04-14 MED ORDER — INSULIN ASPART 100 UNIT/ML ~~LOC~~ SOLN
0.0000 [IU] | Freq: Three times a day (TID) | SUBCUTANEOUS | Status: DC
  Administered 2013-04-14: 2 [IU] via SUBCUTANEOUS

## 2013-04-14 MED ORDER — SODIUM CHLORIDE 0.9 % IJ SOLN: 3.0000 mL | INTRAMUSCULAR | Status: DC | PRN

## 2013-04-14 MED ORDER — LISINOPRIL 40 MG PO TABS
40.0000 mg | ORAL_TABLET | ORAL | Status: DC
  Filled 2013-04-14: qty 1

## 2013-04-14 MED ORDER — ASPIRIN 81 MG PO CHEW
81.0000 mg | CHEWABLE_TABLET | Freq: Every day | ORAL | Status: DC
  Administered 2013-04-14 – 2013-04-15 (×2): 81 mg via ORAL
  Filled 2013-04-14 (×2): qty 1

## 2013-04-14 MED ORDER — METOPROLOL SUCCINATE ER 100 MG PO TB24
100.0000 mg | ORAL_TABLET | Freq: Every day | ORAL | Status: DC
  Administered 2013-04-14 – 2013-04-15 (×2): 100 mg via ORAL
  Filled 2013-04-14 (×3): qty 1

## 2013-04-14 MED ORDER — ENOXAPARIN SODIUM 40 MG/0.4ML ~~LOC~~ SOLN
40.0000 mg | Freq: Every day | SUBCUTANEOUS | Status: DC
  Administered 2013-04-14 – 2013-04-15 (×2): 40 mg via SUBCUTANEOUS
  Filled 2013-04-14 (×2): qty 0.4

## 2013-04-14 MED ORDER — SODIUM CHLORIDE 0.9 % IJ SOLN: 3.0000 mL | Freq: Two times a day (BID) | INTRAMUSCULAR | Status: DC

## 2013-04-14 MED ORDER — SODIUM CHLORIDE 0.9 % IV SOLN: 250.0000 mL | INTRAVENOUS | Status: DC | PRN

## 2013-04-14 MED ORDER — HYDRALAZINE HCL 20 MG/ML IJ SOLN
10.0000 mg | Freq: Once | INTRAMUSCULAR | Status: AC
  Administered 2013-04-14: 15:00:00 via INTRAVENOUS
  Filled 2013-04-14: qty 1

## 2013-04-14 MED ORDER — QUINAPRIL HCL 10 MG PO TABS: 20.0000 mg | ORAL_TABLET | Freq: Every day | ORAL | Status: DC

## 2013-04-14 MED ORDER — LISINOPRIL 20 MG PO TABS
20.0000 mg | ORAL_TABLET | Freq: Every day | ORAL | Status: AC
  Administered 2013-04-14: 20 mg via ORAL
  Filled 2013-04-14 (×3): qty 1

## 2013-04-14 MED ORDER — OXYCODONE HCL 5 MG PO TABS: 5.0000 mg | ORAL_TABLET | ORAL | Status: DC | PRN

## 2013-04-14 MED ORDER — ALUM & MAG HYDROXIDE-SIMETH 200-200-20 MG/5ML PO SUSP: 30.0000 mL | Freq: Four times a day (QID) | ORAL | Status: DC | PRN

## 2013-04-14 MED ORDER — ZOLPIDEM TARTRATE 5 MG PO TABS: 5.0000 mg | ORAL_TABLET | Freq: Every evening | ORAL | Status: DC | PRN

## 2013-04-14 MED ORDER — ATORVASTATIN CALCIUM 20 MG PO TABS
20.0000 mg | ORAL_TABLET | Freq: Every day | ORAL | Status: DC
  Administered 2013-04-14 – 2013-04-15 (×2): 20 mg via ORAL
  Filled 2013-04-14 (×2): qty 1

## 2013-04-14 MED ORDER — NITROGLYCERIN 2 % TD OINT
1.0000 [in_us] | TOPICAL_OINTMENT | Freq: Four times a day (QID) | TRANSDERMAL | Status: DC
  Administered 2013-04-14: 1 [in_us] via TOPICAL
  Filled 2013-04-14: qty 30

## 2013-04-14 MED ORDER — INSULIN ASPART 100 UNIT/ML ~~LOC~~ SOLN: 0.0000 [IU] | Freq: Every day | SUBCUTANEOUS | Status: DC

## 2013-04-14 MED ORDER — MOMETASONE FURO-FORMOTEROL FUM 100-5 MCG/ACT IN AERO
2.0000 | INHALATION_SPRAY | Freq: Two times a day (BID) | RESPIRATORY_TRACT | Status: DC
  Administered 2013-04-14 – 2013-04-15 (×2): 2 via RESPIRATORY_TRACT
  Filled 2013-04-14: qty 8.8

## 2013-04-14 MED ORDER — HYDROMORPHONE HCL PF 1 MG/ML IJ SOLN: 0.5000 mg | INTRAMUSCULAR | Status: DC | PRN

## 2013-04-14 MED ORDER — SODIUM CHLORIDE 0.9 % IJ SOLN
3.0000 mL | Freq: Two times a day (BID) | INTRAMUSCULAR | Status: DC
  Administered 2013-04-14: 3 mL via INTRAVENOUS

## 2013-04-14 MED ORDER — ONDANSETRON HCL 4 MG PO TABS: 4.0000 mg | ORAL_TABLET | Freq: Four times a day (QID) | ORAL | Status: DC | PRN

## 2013-04-14 MED ORDER — LISINOPRIL 20 MG PO TABS
20.0000 mg | ORAL_TABLET | Freq: Once | ORAL | Status: AC
  Administered 2013-04-14: 20 mg via ORAL

## 2013-04-14 MED ORDER — ACETAMINOPHEN 650 MG RE SUPP: 650.0000 mg | Freq: Four times a day (QID) | RECTAL | Status: DC | PRN

## 2013-04-14 MED ORDER — ACETAMINOPHEN 325 MG PO TABS
650.0000 mg | ORAL_TABLET | Freq: Four times a day (QID) | ORAL | Status: DC | PRN
  Administered 2013-04-14: 650 mg via ORAL
  Filled 2013-04-14: qty 2

## 2013-04-14 MED ORDER — AMLODIPINE BESYLATE 5 MG PO TABS
5.0000 mg | ORAL_TABLET | Freq: Every day | ORAL | Status: DC
  Filled 2013-04-14: qty 1

## 2013-04-14 MED ORDER — AMLODIPINE BESYLATE 5 MG PO TABS
5.0000 mg | ORAL_TABLET | Freq: Every day | ORAL | Status: DC
  Administered 2013-04-15: 5 mg via ORAL
  Filled 2013-04-14: qty 1

## 2013-04-14 NOTE — Progress Notes (Addendum)
Patient was seen and examined on multiple occasions today. I spoke with the patient at 0 900, 1330, and 1545. Patient's blood pressure in the early afternoon was 193/76 -Patient was given hydralazine 10 mg IV x1 and also lisinopril 20 mg x1 by mouth -Recheck blood pressure by RN 1 hour later was 200/86 -I personally repeated blood pressure manually at 1545--188/76, HR60 -Discharge was canceled for today -I will give the patient amlodipine 5 mg by mouth daily first dose now -Patient is scheduled to get an additional 20 mg of lisinopril this evening making a total of 40 mg of lisinopril today  The plan was discussed with the patient and she was agreeable to stay another evening. The patient remains completely asymptomatic without any dizziness, chest discomfort, shortness of breath, nausea. CV-RRR Lung CTA Abd-soft/NT+BS  Total time spent with pt today= >50% spent counseling and coordinating care DTat

## 2013-04-14 NOTE — Discharge Summary (Signed)
Physician Discharge Summary  Angela [name] ZOX:[id number] DOB: 07-Oct-1946 DOA: 04/13/2013  PCP: Leanor Rubenstein, MD  Admit date: 04/13/2013 Discharge date: 04/14/2013  Recommendations for Outpatient Follow-up:  1. Pt will need to follow up with PCP in 2 weeks post discharge 2. Please obtain BMP to evaluate electrolytes and kidney function 3. Please also check CBC to evaluate Hg and Hct levels 4. Follow up with cardiology, Dr. Alanda Amass this week Subjective -Admitted early this morning -Patient was seen and examined by myself -All troponins are negative -Discharge was anticipated, but was canceled due to the patient's uncontrolled blood pressure -Plan for morning BMP and adjustment of blood pressure medications -Patient remains asymptomatic without any chest discomfort or shortness of breath Discharge Diagnoses:  Active Problems:   Chest pain   HTN (hypertension)   DM (diabetes mellitus) type 2, uncontrolled, with ketoacidosis   Other and unspecified hyperlipidemia  atypical chest pain -Troponins and EKG were negative -patient is pain-free without any dizziness, chest discomfort, shortness breath  -Patient stated she preferred to follow up with her cardiologist in the outpatient setting -Start aspirin 81 mg daily -The patient was instructed to call her primary care physician for followup appointment on Monday Diabetes mellitus, type II -Patient will restart her metformin in outpatient setting Hypertension -Continue metoprolol succinate 100 mg daily -Restart her Accupril 20 mg daily HTN Urgency -add amlodipine and increase lisinopril to 40mg  daily Discharge Condition: stable  Disposition: home  Diet: 1800 calorie ADA Wt Readings from Last 3 Encounters:  04/14/13 95.165 kg (209 lb 12.8 oz)    History of present illness:  67 year old female who presented to emergency department with chest discomfort lasting one hour prior to admission. The patient was at rest watching television  when it started. She complained of pain radiated down her left arm. However, the patient denied any diaphoresis, shortness breath, dizziness. The patient's chest discomfort has resolved in the emergency department. She remained chest pain-free throughout the hospitalization. The patient's troponins were cycled and were negative. The patient's EKG did not reveal any ST to T wave changes. It was sinus rhythm. Lipids showed total cholesterol 119, HDL 41, LDL 53. She will continue on her Lipitor. The patient was also instructed to restart her metformin. The patient was offered a cardiology consultation, but she preferred to followup with her own cardiologist as an outpatient setting.    Discharge Exam: Filed Vitals:   04/14/13 0514  BP: 175/65  Pulse: 56  Temp: 97.8 F (36.6 C)  Resp: 18   Filed Vitals:   04/14/13 0018 04/14/13 0310 04/14/13 0420 04/14/13 0514  BP: 178/75 152/64 169/73 175/65  Pulse: 57 64 59 56  Temp:  98.5 F (36.9 C)  97.8 F (36.6 C)  TempSrc:  Oral  Oral  Resp: 13 18 16 18   Height:    5' 7.5" (1.715 m)  Weight:    95.165 kg (209 lb 12.8 oz)  SpO2: 98% 100% 100% 99%   General: A&O x 3, NAD, pleasant, cooperative Cardiovascular: RRR, no rub, no gallop, no S3 Respiratory: CTAB, no wheeze, no rhonchi Abdomen:soft, nontender, nondistended, positive bowel sounds Extremities: trace edema, No lymphangitis, no petechiae  Discharge Instructions     Medication List    ASK your doctor about these medications       atorvastatin 20 MG tablet  Commonly known as:  LIPITOR  Take 20 mg by mouth daily.     cyclobenzaprine 10 MG tablet  Commonly known as:  FLEXERIL  Take  10 mg by mouth 3 (three) times daily as needed. For muscle spasm     Fluticasone-Salmeterol 250-50 MCG/DOSE Aepb  Commonly known as:  ADVAIR  Inhale 1 puff into the lungs every 12 (twelve) hours.     metFORMIN 500 MG tablet  Commonly known as:  GLUCOPHAGE  Take 500 mg by mouth 2 (two) times daily  with a meal.     metoprolol succinate 100 MG 24 hr tablet  Commonly known as:  TOPROL-XL  Take 100 mg by mouth daily. Take with or immediately following a meal.     quinapril 20 MG tablet  Commonly known as:  ACCUPRIL  Take 20 mg by mouth at bedtime.         The results of significant diagnostics from this hospitalization (including imaging, microbiology, ancillary and laboratory) are listed below for reference.    Significant Diagnostic Studies: Dg Chest 2 View  04/14/2013   *RADIOLOGY REPORT*  Clinical Data: Right-sided chest pain.  Diabetes and hypertension.  CHEST - 2 VIEW  Comparison: 05/08/2012  Findings: Heart size remains within normal limits.  Diffuse chronic interstitial lung disease is stable in appearance.  No evidence of acute or superimposed infiltrate.  No evidence of pleural effusion. No mass or lymphadenopathy identified.  IMPRESSION: Stable chronic interstitial lung disease.  No acute findings.   Original Report Authenticated By: Myles Rosenthal, M.D.     Microbiology: No results found for this or any previous visit (from the past 240 hour(s)).   Labs: Basic Metabolic Panel:  Recent Labs Lab 04/13/13 2142 04/14/13 0454  NA 139 141  K 3.7 3.8  CL 106 108  CO2 21 25  GLUCOSE 162* 107*  BUN 11 9  CREATININE 0.84 0.85  CALCIUM 9.3 9.3   Liver Function Tests:  Recent Labs Lab 04/13/13 2142  AST 19  ALT 22  ALKPHOS 90  BILITOT 0.2*  PROT 6.7  ALBUMIN 3.7   No results found for this basename: LIPASE, AMYLASE,  in the last 168 hours No results found for this basename: AMMONIA,  in the last 168 hours CBC:  Recent Labs Lab 04/13/13 2142 04/14/13 0454  WBC 8.0 7.5  NEUTROABS 3.8  --   HGB 11.4* 11.2*  HCT 33.5* 33.1*  MCV 86.6 86.6  PLT 215 205   Cardiac Enzymes:  Recent Labs Lab 04/13/13 2142 04/14/13 0700  TROPONINI <0.30 <0.30   BNP: No components found with this basename: POCBNP,  CBG:  Recent Labs Lab 04/14/13 0739  GLUCAP 172*     Time coordinating discharge:  Greater than 30 minutes  Signed:  Sunday Klos, DO [facility] Pager: [phone] 04/14/2013, 9:54 AM

## 2013-04-14 NOTE — Progress Notes (Signed)
Pts bp after I gave IV hydralazine and lisinopril is 200/86. I texted paged Dr. Arbutus Leas, awaiting return of call. Pt asymptomatic. Willc ont to monitor

## 2013-04-14 NOTE — H&P (Addendum)
[facility] History and Physical  Angela [name] ZOX:[id number] DOB: [birth year] DOA: 04/13/2013  Referring physician: EDP PCP: Leanor Rubenstein, MD  Specialists:   Chief Complaint:   Chest Pain  HPI: Angela [name] is a 67 y.o. female who presents to the ED with complaints of dull heavy chest pain radiating down the left arm that lasted 1 hour.   The pain started at 8:30 pm.   She had no SOB, nausea, vomiting or diaphoresis.   In the ED, she was evaluated and her initial cardiac workup was negative.   She reports having a nuclear stress test years ago her cardiologist dr Alanda Amass.      Review of Systems: The patient denies anorexia, fever, chills, weight loss, vision loss, decreased hearing, hoarseness, chest pain, syncope, dyspnea on exertion, peripheral edema, balance deficits, hemoptysis, abdominal pain, melena, hematochezia, severe indigestion/heartburn, hematuria, incontinence, muscle weakness, suspicious skin lesions, transient blindness, difficulty walking, depression, unusual weight change, abnormal bleeding, enlarged lymph nodes, angioedema, and breast masses.    Past Medical History  Diagnosis Date  . Diabetes mellitus   . Hypertension   . Seizures      Past Surgical History  Procedure Laterality Date  . Appendectomy    . Tonsillectomy    . Gastric fundoplication    . Back surgery       Medications:  HOME MEDS: Prior to Admission medications   Medication Sig Start Date End Date Taking? Authorizing Provider  atorvastatin (LIPITOR) 20 MG tablet Take 20 mg by mouth daily.   Yes Historical Provider, MD  cyclobenzaprine (FLEXERIL) 10 MG tablet Take 10 mg by mouth 3 (three) times daily as needed. For muscle spasm   Yes Historical Provider, MD  Fluticasone-Salmeterol (ADVAIR) 250-50 MCG/DOSE AEPB Inhale 1 puff into the lungs every 12 (twelve) hours.   Yes Historical Provider, MD  metFORMIN (GLUCOPHAGE) 500 MG tablet Take 500 mg by mouth 2 (two) times daily with a meal.   Yes  Historical Provider, MD  metoprolol succinate (TOPROL-XL) 100 MG 24 hr tablet Take 100 mg by mouth daily. Take with or immediately following a meal.   Yes Historical Provider, MD  quinapril (ACCUPRIL) 20 MG tablet Take 20 mg by mouth at bedtime.   Yes Historical Provider, MD    Allergies:  Allergies  Allergen Reactions  . Aspirin Other (See Comments)    Turns tongue black  . Codeine Nausea And Vomiting  . Iohexol      Desc: Pt states several years ago during a CT scan w/ iv cotnrast she had severe nausea and vomiting, sob w/ difficulty breathing and swallowing.  She had full 13hr. premeds today (5/14/7) and did fine w/o complications.   . Ciprofloxacin Rash    Social History:   reports that she quit smoking 1 year ago.  She has never used smokeless tobacco. She does not drink alcohol or use illicit drugs.    Family History:  CAD in Maternal Grandmother  HTN in Mother      Physical Exam:  GEN:  Pleasant Obese 67 year old Caucasian Female examined  and in no acute distress; cooperative with exam Filed Vitals:   04/14/13 0018 04/14/13 0310 04/14/13 0420 04/14/13 0514  BP: 178/75 152/64 169/73 175/65  Pulse: 57 64 59 56  Temp:  98.5 F (36.9 C)  97.8 F (36.6 C)  TempSrc:  Oral  Oral  Resp: 13 18 16 18   Height:    5' 7.5" (1.715 m)  Weight:  95.165 kg (209 lb 12.8 oz)  SpO2: 98% 100% 100% 99%   Blood pressure 175/65, pulse 56, temperature 97.8 F (36.6 C), temperature source Oral, resp. rate 18, height 5' 7.5" (1.715 m), weight 95.165 kg (209 lb 12.8 oz), SpO2 99.00%. PSYCH: She is alert and oriented x4; does not appear anxious does not appear depressed; affect is normal HEENT: Normocephalic and Atraumatic, Mucous membranes pink; PERRLA; EOM intact; Fundi:  Benign;  No scleral icterus, Nares: Patent, Oropharynx: Clear,  Fair Dentition, Neck:  FROM, no cervical lymphadenopathy nor thyromegaly or carotid bruit; no JVD; Breasts:: Not examined CHEST WALL: No  tenderness CHEST: Normal respiration, clear to auscultation bilaterally HEART: Regular rate and rhythm; no murmurs rubs or gallops BACK: No kyphosis or scoliosis; no CVA tenderness ABDOMEN: Positive Bowel Sounds, Obese, soft non-tender; no masses, no organomegaly, no pannus; no intertriginous candida. Rectal Exam: Not done EXTREMITIES: No cyanosis, clubbing or edema; no ulcerations. Genitalia: not examined PULSES: 2+ and symmetric SKIN: Normal hydration no rash or ulceration CNS: Cranial nerves 2-12 grossly intact no focal neurologic deficit   Labs & Imaging Results for orders placed during the hospital encounter of 04/13/13 (from the past 48 hour(s))  CBC WITH DIFFERENTIAL     Status: Abnormal   Collection Time    04/13/13  9:42 PM      Result Value Range   WBC 8.0  4.0 - 10.5 K/uL   RBC 3.87  3.87 - 5.11 MIL/uL   Hemoglobin 11.4 (*) 12.0 - 15.0 g/dL   HCT 16.1 (*) 09.6 - 04.5 %   MCV 86.6  78.0 - 100.0 fL   MCH 29.5  26.0 - 34.0 pg   MCHC 34.0  30.0 - 36.0 g/dL   RDW 40.9  81.1 - 91.4 %   Platelets 215  150 - 400 K/uL   Neutrophils Relative % 48  43 - 77 %   Neutro Abs 3.8  1.7 - 7.7 K/uL   Lymphocytes Relative 40  12 - 46 %   Lymphs Abs 3.2  0.7 - 4.0 K/uL   Monocytes Relative 8  3 - 12 %   Monocytes Absolute 0.7  0.1 - 1.0 K/uL   Eosinophils Relative 3  0 - 5 %   Eosinophils Absolute 0.2  0.0 - 0.7 K/uL   Basophils Relative 1  0 - 1 %   Basophils Absolute 0.1  0.0 - 0.1 K/uL  COMPREHENSIVE METABOLIC PANEL     Status: Abnormal   Collection Time    04/13/13  9:42 PM      Result Value Range   Sodium 139  135 - 145 mEq/L   Potassium 3.7  3.5 - 5.1 mEq/L   Chloride 106  96 - 112 mEq/L   CO2 21  19 - 32 mEq/L   Glucose, Bld 162 (*) 70 - 99 mg/dL   BUN 11  6 - 23 mg/dL   Creatinine, Ser 7.82  0.50 - 1.10 mg/dL   Calcium 9.3  8.4 - 95.6 mg/dL   Total Protein 6.7  6.0 - 8.3 g/dL   Albumin 3.7  3.5 - 5.2 g/dL   AST 19  0 - 37 U/L   ALT 22  0 - 35 U/L   Alkaline  Phosphatase 90  39 - 117 U/L   Total Bilirubin 0.2 (*) 0.3 - 1.2 mg/dL   GFR calc non Af Amer 72 (*) >90 mL/min   GFR calc Af Amer 83 (*) >90 mL/min   Comment:  The eGFR has been calculated     using the CKD EPI equation.     This calculation has not been     validated in all clinical     situations.     eGFR's persistently     <90 mL/min signify     possible Chronic Kidney Disease.  TROPONIN I     Status: None   Collection Time    04/13/13  9:42 PM      Result Value Range   Troponin I <0.30  <0.30 ng/mL   Comment:            Due to the release kinetics of cTnI,     a negative result within the first hours     of the onset of symptoms does not rule out     myocardial infarction with certainty.     If myocardial infarction is still suspected,     repeat the test at appropriate intervals.  URINALYSIS, ROUTINE W REFLEX MICROSCOPIC     Status: Abnormal   Collection Time    04/13/13 10:02 PM      Result Value Range   Color, Urine YELLOW  YELLOW   APPearance CLEAR  CLEAR   Specific Gravity, Urine 1.005  1.005 - 1.030   pH 6.5  5.0 - 8.0   Glucose, UA NEGATIVE  NEGATIVE mg/dL   Hgb urine dipstick TRACE (*) NEGATIVE   Bilirubin Urine NEGATIVE  NEGATIVE   Ketones, ur NEGATIVE  NEGATIVE mg/dL   Protein, ur NEGATIVE  NEGATIVE mg/dL   Urobilinogen, UA 0.2  0.0 - 1.0 mg/dL   Nitrite NEGATIVE  NEGATIVE   Leukocytes, UA NEGATIVE  NEGATIVE  URINE MICROSCOPIC-ADD ON     Status: None   Collection Time    04/13/13 10:02 PM      Result Value Range   Squamous Epithelial / LPF RARE  RARE   WBC, UA 0-2  <3 WBC/hpf   RBC / HPF 0-2  <3 RBC/hpf   Bacteria, UA RARE  RARE  BASIC METABOLIC PANEL     Status: Abnormal   Collection Time    04/14/13  4:54 AM      Result Value Range   Sodium 141  135 - 145 mEq/L   Potassium 3.8  3.5 - 5.1 mEq/L   Chloride 108  96 - 112 mEq/L   CO2 25  19 - 32 mEq/L   Glucose, Bld 107 (*) 70 - 99 mg/dL   BUN 9  6 - 23 mg/dL   Creatinine, Ser 8.65   0.50 - 1.10 mg/dL   Calcium 9.3  8.4 - 78.4 mg/dL   GFR calc non Af Amer 71 (*) >90 mL/min   GFR calc Af Amer 82 (*) >90 mL/min   Comment:            The eGFR has been calculated     using the CKD EPI equation.     This calculation has not been     validated in all clinical     situations.     eGFR's persistently     <90 mL/min signify     possible Chronic Kidney Disease.  CBC     Status: Abnormal   Collection Time    04/14/13  4:54 AM      Result Value Range   WBC 7.5  4.0 - 10.5 K/uL   RBC 3.82 (*) 3.87 - 5.11 MIL/uL   Hemoglobin 11.2 (*) 12.0 - 15.0 g/dL  HCT 33.1 (*) 36.0 - 46.0 %   MCV 86.6  78.0 - 100.0 fL   MCH 29.3  26.0 - 34.0 pg   MCHC 33.8  30.0 - 36.0 g/dL   RDW 69.6  29.5 - 28.4 %   Platelets 205  150 - 400 K/uL    Radiological Exams on Admission: Dg Chest 2 View  04/14/2013   *RADIOLOGY REPORT*  Clinical Data: Right-sided chest pain.  Diabetes and hypertension.  CHEST - 2 VIEW  Comparison: 05/08/2012  Findings: Heart size remains within normal limits.  Diffuse chronic interstitial lung disease is stable in appearance.  No evidence of acute or superimposed infiltrate.  No evidence of pleural effusion. No mass or lymphadenopathy identified.  IMPRESSION: Stable chronic interstitial lung disease.  No acute findings.   Original Report Authenticated By: Myles Rosenthal, M.D.    EKG:  Normal Sinus Rhythm without Acute S-T changes  Assessment/Plan Active Problems:   Chest pain   HTN (hypertension)   DM (diabetes mellitus) type 2, uncontrolled, with ketoacidosis   Other and unspecified hyperlipidemia    1.  Chest Pain-   Cycle troponins q 6 hours, place on Nitropaste, O2, and ASA Rx.  Continue Metoprolol.    2.  HTN- continue Metoprolol, and Qunipril Rx.    3.  DM2- Hold Metformin in event of additional contrast studies or stress testing,  SSI coverage PRN.    4.  Hyperlipidemia- Continue Atorvastatin, Check Fasting lipids.      5. Other- DVT prophylaxis with  Lovenox.      Code Status:  FULL CODE Family Communication:  No Family Present Disposition Plan:  Return to Home on Discharge  Time spent: 53 Minutes  Ron Parker [facility] Pager [phone]  If 7PM-7AM, please contact night-coverage [email or web address] Password [email or web address] 04/14/2013, 5:50 AM

## 2013-04-14 NOTE — ED Notes (Signed)
Pt. returned from XR. 

## 2013-04-14 NOTE — ED Notes (Signed)
Pt transported to XR.  

## 2013-04-14 NOTE — ED Provider Notes (Signed)
History     CSN: [id number]  Arrival date & time 04/13/13  2126   First MD Initiated Contact with Patient 04/14/13 0045      Chief Complaint  Patient presents with  . Chest Pain    (Consider location/radiation/quality/duration/timing/severity/associated sxs/prior treatment) Patient is a 67 y.o. female presenting with chest pain. The history is provided by the patient.  Chest Pain Pain location:  L chest Associated symptoms: no abdominal pain, no back pain, no headache, no nausea, no numbness, no shortness of breath, not vomiting and no weakness    patient presents with elevated blood pressure and left-sided chest pain. She states that she had previous well controlled blood pressure but switched doctors. She states Dr. Wynelle Link change medications around. States her blood pressures been running high. She states it was 220/110. She's been moderate with her home blood pressure cuff. She states that today she had some left-sided chest pain that went to the left shoulder. She states she is pain-free now. The pain lasted an hour or 2. No lightheadedness. No dizziness. No cough. No swelling or legs. She states she has been taking her medications.  Past Medical History  Diagnosis Date  . Diabetes mellitus   . Hypertension   . Seizures     Past Surgical History  Procedure Laterality Date  . Appendectomy    . Tonsillectomy    . Gastric fundoplication    . Back surgery      No family history on file.  History  Substance Use Topics  . Smoking status: Current Every Day Smoker  . Smokeless tobacco: Never Used  . Alcohol Use: No    OB History   Grav Para Term Preterm Abortions TAB SAB Ect Mult Living                  Review of Systems  Constitutional: Negative for activity change and appetite change.  HENT: Negative for neck stiffness.   Eyes: Negative for pain.  Respiratory: Negative for chest tightness and shortness of breath.   Cardiovascular: Positive for chest pain. Negative  for leg swelling.  Gastrointestinal: Negative for nausea, vomiting, abdominal pain and diarrhea.  Genitourinary: Negative for flank pain.  Musculoskeletal: Negative for back pain.  Skin: Negative for rash.  Neurological: Negative for weakness, numbness and headaches.  Psychiatric/Behavioral: Negative for behavioral problems.    Allergies  Aspirin; Codeine; Iohexol; and Ciprofloxacin  Home Medications   No current outpatient prescriptions on file.  BP 175/65  Pulse 56  Temp(Src) 97.8 F (36.6 C) (Oral)  Resp 18  Ht 5' 7.5" (1.715 m)  Wt 209 lb 12.8 oz (95.165 kg)  BMI 32.36 kg/m2  SpO2 99%  Physical Exam  Nursing note and vitals reviewed. Constitutional: She is oriented to person, place, and time. She appears well-developed and well-nourished.  HENT:  Head: Normocephalic and atraumatic.  Eyes: EOM are normal. Pupils are equal, round, and reactive to light.  Neck: Normal range of motion. Neck supple.  Cardiovascular: Normal rate, regular rhythm and normal heart sounds.   No murmur heard. Pulmonary/Chest: Effort normal and breath sounds normal. No respiratory distress. She has no wheezes. She has no rales.  Abdominal: Soft. Bowel sounds are normal. She exhibits no distension. There is tenderness. There is no rebound and no guarding.  Reducible supraumbilical hernia.  Musculoskeletal: Normal range of motion.  Neurological: She is alert and oriented to person, place, and time. No cranial nerve deficit.  Skin: Skin is warm and dry.  Psychiatric:  She has a normal mood and affect. Her speech is normal.    ED Course  Procedures (including critical care time)  Labs Reviewed  CBC WITH DIFFERENTIAL - Abnormal; Notable for the following:    Hemoglobin 11.4 (*)    HCT 33.5 (*)    All other components within normal limits  COMPREHENSIVE METABOLIC PANEL - Abnormal; Notable for the following:    Glucose, Bld 162 (*)    Total Bilirubin 0.2 (*)    GFR calc non Af Amer 72 (*)     GFR calc Af Amer 83 (*)    All other components within normal limits  URINALYSIS, ROUTINE W REFLEX MICROSCOPIC - Abnormal; Notable for the following:    Hgb urine dipstick TRACE (*)    All other components within normal limits  BASIC METABOLIC PANEL - Abnormal; Notable for the following:    Glucose, Bld 107 (*)    GFR calc non Af Amer 71 (*)    GFR calc Af Amer 82 (*)    All other components within normal limits  CBC - Abnormal; Notable for the following:    RBC 3.82 (*)    Hemoglobin 11.2 (*)    HCT 33.1 (*)    All other components within normal limits  GLUCOSE, CAPILLARY - Abnormal; Notable for the following:    Glucose-Capillary 172 (*)    All other components within normal limits  TROPONIN I  URINE MICROSCOPIC-ADD ON  TROPONIN I  TROPONIN I  TROPONIN I  LIPID PANEL   Dg Chest 2 View  04/14/2013   *RADIOLOGY REPORT*  Clinical Data: Right-sided chest pain.  Diabetes and hypertension.  CHEST - 2 VIEW  Comparison: 05/08/2012  Findings: Heart size remains within normal limits.  Diffuse chronic interstitial lung disease is stable in appearance.  No evidence of acute or superimposed infiltrate.  No evidence of pleural effusion. No mass or lymphadenopathy identified.  IMPRESSION: Stable chronic interstitial lung disease.  No acute findings.   Original Report Authenticated By: Myles Rosenthal, M.D.     1. Chest pain   2. DM (diabetes mellitus) type 2, uncontrolled, with ketoacidosis   3. HTN (hypertension)   4. Other and unspecified hyperlipidemia       MDM  Patient with chest pain. EKG reassuring. First set of enzymes negative. She also has moderate hypertension. Pain-free now. Will be admitted to medicine for further evaluation and treatment        Juliet Rude. Rubin Payor, MD 04/14/13 [phone]

## 2013-04-15 LAB — BASIC METABOLIC PANEL
BUN: 12 mg/dL (ref 6–23)
CO2: 26 mEq/L (ref 19–32)
Chloride: 104 mEq/L (ref 96–112)
Creatinine, Ser: 0.89 mg/dL (ref 0.50–1.10)
GFR calc Af Amer: 78 mL/min — ABNORMAL LOW (ref >90)

## 2013-04-15 MED ORDER — ASPIRIN 81 MG PO CHEW: 81.0000 mg | CHEWABLE_TABLET | Freq: Every day | ORAL | Status: DC

## 2013-04-15 MED ORDER — LISINOPRIL 40 MG PO TABS: 40.0000 mg | ORAL_TABLET | ORAL | Status: DC

## 2013-04-15 MED ORDER — AMLODIPINE BESYLATE 5 MG PO TABS: 5.0000 mg | ORAL_TABLET | Freq: Every day | ORAL | Status: DC

## 2013-04-15 NOTE — Progress Notes (Signed)
Pt's assessment unchanged from this am. D/c'd with friend to private vehicle

## 2013-04-15 NOTE — Discharge Summary (Signed)
Physician Discharge Summary  Angela [name] WUJ:[id number] DOB: [birth year] DOA: 04/13/2013  PCP: Leanor Rubenstein, MD  Admit date: 04/13/2013 Discharge date: 04/15/2013  Recommendations for Outpatient Follow-up:  1. Pt will need to follow up with PCP in 2 weeks post discharge 2. Please obtain BMP to evaluate electrolytes and kidney function 3. Please also check CBC to evaluate Hg and Hct levels 4. Follow up with cardiology, Dr. Alanda Amass this week  Discharge Diagnoses:  Active Problems:   Chest pain   HTN (hypertension)   DM (diabetes mellitus) type 2, uncontrolled, with ketoacidosis   Other and unspecified hyperlipidemia  atypical chest pain -Troponinsx 3 neg and EKG were negative -patient is pain-free without any dizziness, chest discomfort, shortness breath  -Patient stated she preferred to follow up with her cardiologist in the outpatient setting -Start aspirin 81 mg daily -The patient was instructed to call her primary care physician for followup appointment on Monday Diabetes mellitus, type II -Patient will restart her metformin in outpatient setting Hypertension urgency -Patient's discharge was held on 04/15/2003 came due to uncontrolled BP -The patient was given 1 dose of hydralazine IV 10 mg and her antihypertensives were adjusted -BP is better controlled on the day of discharge with a morning blood pressure 158/65 -Continue metoprolol succinate 100 mg daily -added amlodipine and increased lisinopril to 40mg  daily -stop accupril Discharge Condition: stable  Disposition: home  Diet: 1800 calorie ADA Wt Readings from Last 3 Encounters:  04/15/13 96.3 kg (212 lb 4.9 oz)    History of present illness:  67 year old female who presented to emergency department with chest discomfort lasting one hour prior to admission. The patient was at rest watching television when it started. She complained of pain radiated down her left arm. However, the patient denied any diaphoresis, shortness  breath, dizziness. The patient's chest discomfort has resolved in the emergency department. She remained chest pain-free throughout the hospitalization. The patient's troponins were cycled and were negative. The patient's EKG did not reveal any ST to T wave changes. It was sinus rhythm. Lipids showed total cholesterol 119, HDL 41, LDL 53. She will continue on her Lipitor. The patient was also instructed to restart her metformin. The patient was offered a cardiology consultation, but she preferred to followup with her own cardiologist as an outpatient setting.    Discharge Exam: Filed Vitals:   04/15/13 0547  BP: 158/65  Pulse: 57  Temp: 98.1 F (36.7 C)  Resp: 18   Filed Vitals:   04/14/13 1838 04/14/13 2143 04/14/13 2350 04/15/13 0547  BP: 166/78 156/65  158/65  Pulse:  53 62 57  Temp:  98.4 F (36.9 C)  98.1 F (36.7 C)  TempSrc:  Oral  Oral  Resp:  18 18 18   Height:      Weight:    96.3 kg (212 lb 4.9 oz)  SpO2:  99%  99%   General: A&O x 3, NAD, pleasant, cooperative Cardiovascular: RRR, no rub, no gallop, no S3 Respiratory: CTAB, no wheeze, no rhonchi Abdomen:soft, nontender, nondistended, positive bowel sounds Extremities: trace edema, No lymphangitis, no petechiae  Discharge Instructions      Discharge Orders   Future Orders Complete By Expires     Diet - low sodium heart healthy  As directed     Increase activity slowly  As directed         Medication List    STOP taking these medications       quinapril 20 MG tablet  Commonly known  as:  ACCUPRIL      TAKE these medications       amLODipine 5 MG tablet  Commonly known as:  NORVASC  Take 1 tablet (5 mg total) by mouth daily.     aspirin 81 MG chewable tablet  Chew 1 tablet (81 mg total) by mouth daily.     atorvastatin 20 MG tablet  Commonly known as:  LIPITOR  Take 20 mg by mouth daily.     cyclobenzaprine 10 MG tablet  Commonly known as:  FLEXERIL  Take 10 mg by mouth 3 (three) times daily as  needed. For muscle spasm     Fluticasone-Salmeterol 250-50 MCG/DOSE Aepb  Commonly known as:  ADVAIR  Inhale 1 puff into the lungs every 12 (twelve) hours.     lisinopril 40 MG tablet  Commonly known as:  PRINIVIL,ZESTRIL  Take 1 tablet (40 mg total) by mouth daily.     metFORMIN 500 MG tablet  Commonly known as:  GLUCOPHAGE  Take 500 mg by mouth 2 (two) times daily with a meal.     metoprolol succinate 100 MG 24 hr tablet  Commonly known as:  TOPROL-XL  Take 100 mg by mouth daily. Take with or immediately following a meal.         The results of significant diagnostics from this hospitalization (including imaging, microbiology, ancillary and laboratory) are listed below for reference.    Significant Diagnostic Studies: Dg Chest 2 View  04/14/2013   *RADIOLOGY REPORT*  Clinical Data: Right-sided chest pain.  Diabetes and hypertension.  CHEST - 2 VIEW  Comparison: 05/08/2012  Findings: Heart size remains within normal limits.  Diffuse chronic interstitial lung disease is stable in appearance.  No evidence of acute or superimposed infiltrate.  No evidence of pleural effusion. No mass or lymphadenopathy identified.  IMPRESSION: Stable chronic interstitial lung disease.  No acute findings.   Original Report Authenticated By: Myles Rosenthal, M.D.     Microbiology: No results found for this or any previous visit (from the past 240 hour(s)).   Labs: Basic Metabolic Panel:  Recent Labs Lab 04/13/13 2142 04/14/13 0454 04/15/13 0510  NA 139 141 140  K 3.7 3.8 3.9  CL 106 108 104  CO2 21 25 26   GLUCOSE 162* 107* 117*  BUN 11 9 12   CREATININE 0.84 0.85 0.89  CALCIUM 9.3 9.3 9.6  MG  --   --  2.4   Liver Function Tests:  Recent Labs Lab 04/13/13 2142  AST 19  ALT 22  ALKPHOS 90  BILITOT 0.2*  PROT 6.7  ALBUMIN 3.7   No results found for this basename: LIPASE, AMYLASE,  in the last 168 hours No results found for this basename: AMMONIA,  in the last 168  hours CBC:  Recent Labs Lab 04/13/13 2142 04/14/13 0454  WBC 8.0 7.5  NEUTROABS 3.8  --   HGB 11.4* 11.2*  HCT 33.5* 33.1*  MCV 86.6 86.6  PLT 215 205   Cardiac Enzymes:  Recent Labs Lab 04/13/13 2142 04/14/13 0700 04/14/13 1232  TROPONINI <0.30 <0.30 <0.30   BNP: No components found with this basename: POCBNP,  CBG:  Recent Labs Lab 04/14/13 0739 04/14/13 1144 04/14/13 1620 04/14/13 2059  GLUCAP 172* 90 95 113*    Time coordinating discharge:  Greater than 30 minutes  Signed:  Maleik Vanderzee, DO [facility] Pager: [phone] 04/15/2013, 10:17 AM

## 2013-04-18 ENCOUNTER — Encounter (INDEPENDENT_AMBULATORY_CARE_PROVIDER_SITE_OTHER): Payer: Self-pay

## 2013-04-18 ENCOUNTER — Telehealth (INDEPENDENT_AMBULATORY_CARE_PROVIDER_SITE_OTHER): Payer: Self-pay

## 2013-04-18 NOTE — Telephone Encounter (Signed)
Called Angela [name] with her new appt.  She informed me that she was at the ER this past weekend with severe hypertension.  She will have to see her cardiologist for treatment.  She will call back when she is ready to reschedule.

## 2013-04-23 ENCOUNTER — Encounter (INDEPENDENT_AMBULATORY_CARE_PROVIDER_SITE_OTHER): Payer: Self-pay

## 2013-04-26 ENCOUNTER — Ambulatory Visit (INDEPENDENT_AMBULATORY_CARE_PROVIDER_SITE_OTHER): Payer: PRIVATE HEALTH INSURANCE | Admitting: General Surgery

## 2013-05-02 ENCOUNTER — Telehealth (INDEPENDENT_AMBULATORY_CARE_PROVIDER_SITE_OTHER): Payer: Self-pay

## 2013-05-02 NOTE — Telephone Encounter (Signed)
Contacted pt's PCP who referred her to let them know pt has cancelled two appointments.  Has not rescheduled.

## 2013-05-06 ENCOUNTER — Telehealth (INDEPENDENT_AMBULATORY_CARE_PROVIDER_SITE_OTHER): Payer: Self-pay

## 2013-05-06 NOTE — Telephone Encounter (Signed)
Pt cancelled June appointment with Dr. Abbey Chatters.  Dr. Maurice Small office notified.

## 2013-05-20 ENCOUNTER — Ambulatory Visit (INDEPENDENT_AMBULATORY_CARE_PROVIDER_SITE_OTHER): Payer: PRIVATE HEALTH INSURANCE | Admitting: General Surgery

## 2013-05-27 ENCOUNTER — Emergency Department (HOSPITAL_COMMUNITY): Payer: Medicare Other

## 2013-05-27 ENCOUNTER — Encounter (HOSPITAL_COMMUNITY): Payer: Self-pay | Admitting: Emergency Medicine

## 2013-05-27 ENCOUNTER — Emergency Department (HOSPITAL_COMMUNITY)
Admission: EM | Admit: 2013-05-27 | Discharge: 2013-05-28 | Disposition: A | Discharge status: Home / Self Care | Payer: Medicare Other | Attending: Emergency Medicine | Admitting: Emergency Medicine

## 2013-05-27 DIAGNOSIS — K5732 Diverticulitis of large intestine without perforation or abscess without bleeding: Secondary | ICD-10-CM | POA: Insufficient documentation

## 2013-05-27 DIAGNOSIS — N189 Chronic kidney disease, unspecified: Secondary | ICD-10-CM | POA: Insufficient documentation

## 2013-05-27 DIAGNOSIS — E119 Type 2 diabetes mellitus without complications: Secondary | ICD-10-CM | POA: Insufficient documentation

## 2013-05-27 DIAGNOSIS — F411 Generalized anxiety disorder: Secondary | ICD-10-CM | POA: Insufficient documentation

## 2013-05-27 DIAGNOSIS — IMO0001 Reserved for inherently not codable concepts without codable children: Secondary | ICD-10-CM | POA: Insufficient documentation

## 2013-05-27 DIAGNOSIS — J4489 Other specified chronic obstructive pulmonary disease: Secondary | ICD-10-CM | POA: Insufficient documentation

## 2013-05-27 DIAGNOSIS — F3289 Other specified depressive episodes: Secondary | ICD-10-CM | POA: Insufficient documentation

## 2013-05-27 DIAGNOSIS — I129 Hypertensive chronic kidney disease with stage 1 through stage 4 chronic kidney disease, or unspecified chronic kidney disease: Secondary | ICD-10-CM | POA: Insufficient documentation

## 2013-05-27 DIAGNOSIS — Z8679 Personal history of other diseases of the circulatory system: Secondary | ICD-10-CM | POA: Insufficient documentation

## 2013-05-27 DIAGNOSIS — F17200 Nicotine dependence, unspecified, uncomplicated: Secondary | ICD-10-CM | POA: Insufficient documentation

## 2013-05-27 DIAGNOSIS — Z79899 Other long term (current) drug therapy: Secondary | ICD-10-CM | POA: Insufficient documentation

## 2013-05-27 DIAGNOSIS — J449 Chronic obstructive pulmonary disease, unspecified: Secondary | ICD-10-CM | POA: Insufficient documentation

## 2013-05-27 DIAGNOSIS — Z7982 Long term (current) use of aspirin: Secondary | ICD-10-CM | POA: Insufficient documentation

## 2013-05-27 DIAGNOSIS — F329 Major depressive disorder, single episode, unspecified: Secondary | ICD-10-CM | POA: Insufficient documentation

## 2013-05-27 DIAGNOSIS — M519 Unspecified thoracic, thoracolumbar and lumbosacral intervertebral disc disorder: Secondary | ICD-10-CM | POA: Insufficient documentation

## 2013-05-27 DIAGNOSIS — K5792 Diverticulitis of intestine, part unspecified, without perforation or abscess without bleeding: Secondary | ICD-10-CM

## 2013-05-27 MED ORDER — ONDANSETRON HCL 4 MG/2ML IJ SOLN
4.0000 mg | Freq: Once | INTRAMUSCULAR | Status: AC
  Administered 2013-05-27: 4 mg via INTRAVENOUS
  Filled 2013-05-27: qty 2

## 2013-05-27 MED ORDER — SODIUM CHLORIDE 0.9 % IV SOLN
INTRAVENOUS | Status: DC
  Administered 2013-05-27: via INTRAVENOUS

## 2013-05-27 MED ORDER — FENTANYL CITRATE 0.05 MG/ML IJ SOLN
50.0000 ug | INTRAMUSCULAR | Status: DC | PRN
  Administered 2013-05-27 – 2013-05-28 (×2): 50 ug via INTRAVENOUS
  Filled 2013-05-27 (×2): qty 2

## 2013-05-27 MED ORDER — DIPHENHYDRAMINE HCL 50 MG/ML IJ SOLN
12.5000 mg | Freq: Once | INTRAMUSCULAR | Status: AC
  Administered 2013-05-27: 12.5 mg via INTRAVENOUS
  Filled 2013-05-27: qty 1

## 2013-05-27 NOTE — ED Notes (Signed)
Pt states she had a hernia repair years ago, and has been have abdominal pain since. Pt states today her pain was worse in LLQ. Pt states she has had nausea with pain.

## 2013-05-27 NOTE — ED Notes (Signed)
[id number]<AV> Expected date:<BR> Expected time:<BR> Means of arrival:<BR> Comments:<BR> EMS/abdominal pain

## 2013-05-27 NOTE — ED Notes (Signed)
Pt BIB EMS. Pt c/o R side inguinal pain. Pain started just 45 min prior to pt calling EMS. Pt has a hx of hernia. Pt has surgical consult for same. Pt denies nausea. Pt a/o x 4. Pt with no acute distress.

## 2013-05-27 NOTE — ED Provider Notes (Signed)
History    CSN: [id number] Arrival date & time 05/27/13  2249  First MD Initiated Contact with Patient 05/27/13 2307     Chief Complaint  Patient presents with  . Abdominal Pain   (Consider location/radiation/quality/duration/timing/severity/associated sxs/prior Treatment) HPI Hx per PT - LLQ pain tonight at home, severe, not radiating, sharp in quality, has known midline ABD hernia. Has appt with [facility] next week. No F/C, no N/V/D or constipation, last BM today. Has h/o hematuria unchanged.  Symptoms mod to severe, no known aggravating or alleviating factors. H/o multiple ABD surgeries including hernia repair.  Past Medical History  Diagnosis Date  . Diabetes mellitus   . Hypertension   . Seizures   . Depression     with anxiety  . Chronic kidney disease     chronic  . DDD (degenerative disc disease)   . Fibromyalgia   . COPD (chronic obstructive pulmonary disease)    Past Surgical History  Procedure Laterality Date  . Appendectomy    . Tonsillectomy    . Gastric fundoplication    . Back surgery    . Hernia repair     History reviewed. No pertinent family history. History  Substance Use Topics  . Smoking status: Current Every Day Smoker  . Smokeless tobacco: Never Used  . Alcohol Use: No   OB History   Grav Para Term Preterm Abortions TAB SAB Ect Mult Living                 Review of Systems  Constitutional: Negative for fever and chills.  HENT: Negative for neck pain and neck stiffness.   Eyes: Negative for pain.  Respiratory: Negative for shortness of breath.   Cardiovascular: Negative for chest pain.  Gastrointestinal: Positive for abdominal pain. Negative for abdominal distention.  Genitourinary: Negative for dysuria.  Musculoskeletal: Negative for back pain.  Skin: Negative for rash.  Neurological: Negative for headaches.  All other systems reviewed and are negative.    Allergies  Aspirin; Codeine; Iohexol; and Ciprofloxacin  Home  Medications   Current Outpatient Rx  Name  Route  Sig  Dispense  Refill  . amLODipine (NORVASC) 5 MG tablet   Oral   Take 1 tablet (5 mg total) by mouth daily.   30 tablet   0   . aspirin 81 MG chewable tablet   Oral   Chew 1 tablet (81 mg total) by mouth daily.         Marland Kitchen atorvastatin (LIPITOR) 20 MG tablet   Oral   Take 20 mg by mouth daily.         . cyclobenzaprine (FLEXERIL) 10 MG tablet   Oral   Take 10 mg by mouth 3 (three) times daily as needed. For muscle spasm         . Fluticasone-Salmeterol (ADVAIR) 250-50 MCG/DOSE AEPB   Inhalation   Inhale 1 puff into the lungs every 12 (twelve) hours.         Marland Kitchen lisinopril (PRINIVIL,ZESTRIL) 40 MG tablet   Oral   Take 1 tablet (40 mg total) by mouth daily.   30 tablet   0   . metFORMIN (GLUCOPHAGE) 500 MG tablet   Oral   Take 500 mg by mouth 2 (two) times daily with a meal.         . metoprolol succinate (TOPROL-XL) 100 MG 24 hr tablet   Oral   Take 100 mg by mouth daily. Take with or immediately following a meal.         .  quinapril (ACCUPRIL) 20 MG tablet   Oral   Take 20 mg by mouth at bedtime.         . sertraline (ZOLOFT) 50 MG tablet   Oral   Take 50 mg by mouth daily.          BP 127/55  Pulse 71  Temp(Src) 99.2 F (37.3 C) (Oral)  Resp 18  Ht 5\' 7"  (1.702 m)  Wt 209 lb (94.802 kg)  BMI 32.73 kg/m2  SpO2 98% Physical Exam  Constitutional: She is oriented to person, place, and time. She appears well-developed and well-nourished.  HENT:  Head: Normocephalic and atraumatic.  Mouth/Throat: Oropharynx is clear and moist.  Eyes: EOM are normal. Pupils are equal, round, and reactive to light. No scleral icterus.  Neck: Neck supple.  Cardiovascular: Normal rate, regular rhythm and intact distal pulses.   Pulmonary/Chest: Effort normal and breath sounds normal. No respiratory distress.  Abdominal: Soft.  TTP LLQ with vol guarding. No tenderness otherwise  Musculoskeletal: Normal range of  motion. She exhibits no edema.  Neurological: She is alert and oriented to person, place, and time.  Skin: Skin is warm and dry.    ED Course  Procedures (including critical care time)  Results for orders placed during the hospital encounter of 05/27/13  CBC      Result Value Range   WBC 12.3 (*) 4.0 - 10.5 K/uL   RBC 4.01  3.87 - 5.11 MIL/uL   Hemoglobin 11.7 (*) 12.0 - 15.0 g/dL   HCT 16.1 (*) 09.6 - 04.5 %   MCV 87.3  78.0 - 100.0 fL   MCH 29.2  26.0 - 34.0 pg   MCHC 33.4  30.0 - 36.0 g/dL   RDW 40.9  81.1 - 91.4 %   Platelets 235  150 - 400 K/uL  COMPREHENSIVE METABOLIC PANEL      Result Value Range   Sodium 138  135 - 145 mEq/L   Potassium 4.4  3.5 - 5.1 mEq/L   Chloride 102  96 - 112 mEq/L   CO2 24  19 - 32 mEq/L   Glucose, Bld 88  70 - 99 mg/dL   BUN 18  6 - 23 mg/dL   Creatinine, Ser 7.82 (*) 0.50 - 1.10 mg/dL   Calcium 9.7  8.4 - 95.6 mg/dL   Total Protein 7.3  6.0 - 8.3 g/dL   Albumin 3.9  3.5 - 5.2 g/dL   AST 21  0 - 37 U/L   ALT 24  0 - 35 U/L   Alkaline Phosphatase 125 (*) 39 - 117 U/L   Total Bilirubin 0.3  0.3 - 1.2 mg/dL   GFR calc non Af Amer 47 (*) >90 mL/min   GFR calc Af Amer 55 (*) >90 mL/min  LIPASE, BLOOD      Result Value Range   Lipase 23  11 - 59 U/L  URINALYSIS, ROUTINE W REFLEX MICROSCOPIC      Result Value Range   Color, Urine YELLOW  YELLOW   APPearance CLEAR  CLEAR   Specific Gravity, Urine 1.011  1.005 - 1.030   pH 5.5  5.0 - 8.0   Glucose, UA NEGATIVE  NEGATIVE mg/dL   Hgb urine dipstick NEGATIVE  NEGATIVE   Bilirubin Urine NEGATIVE  NEGATIVE   Ketones, ur NEGATIVE  NEGATIVE mg/dL   Protein, ur NEGATIVE  NEGATIVE mg/dL   Urobilinogen, UA 0.2  0.0 - 1.0 mg/dL   Nitrite NEGATIVE  NEGATIVE   Leukocytes,  UA NEGATIVE  NEGATIVE  POCT I-STAT, CHEM 8      Result Value Range   Sodium 139  135 - 145 mEq/L   Potassium 4.4  3.5 - 5.1 mEq/L   Chloride 105  96 - 112 mEq/L   BUN 19  6 - 23 mg/dL   Creatinine, Ser 1.91 (*) 0.50 - 1.10  mg/dL   Glucose, Bld 91  70 - 99 mg/dL   Calcium, Ion 4.78  2.95 - 1.30 mmol/L   TCO2 26  0 - 100 mmol/L   Hemoglobin 11.9 (*) 12.0 - 15.0 g/dL   HCT 62.1 (*) 30.8 - 65.7 %   Ct Abdomen Pelvis Wo Contrast  05/28/2013   *RADIOLOGY REPORT*  Clinical Data: Right-sided inguinal hernia pain.  CT ABDOMEN AND PELVIS WITHOUT CONTRAST  Technique:  Multidetector CT imaging of the abdomen and pelvis was performed following the standard protocol without intravenous contrast.  Comparison: CT of the abdomen and pelvis performed 03/27/2006  Findings: Minimal bibasilar atelectasis is noted.  Postoperative change is noted about the gastroesophageal junction, reflecting prior fundoplication.  The liver and spleen are unremarkable in appearance.  The gallbladder is within normal limits.  The pancreas and adrenal glands are unremarkable.  Nonspecific perinephric stranding is noted bilaterally.  There is mild right-sided pelvicaliectasis, without significant hydronephrosis.  The kidneys are otherwise unremarkable in appearance.  No renal or ureteral stones are identified.  No free fluid is identified.  The small bowel is unremarkable in appearance.  The stomach is within normal limits.  No acute vascular abnormalities are seen.  Mild scattered calcification is noted along the abdominal aorta and its branches.  The patient is status post appendectomy.  Focal soft tissue inflammation is noted along the distal descending colon, with trace associated free fluid and mild colonic wall thickening.  Associated inflamed diverticula are seen, with acute diverticulitis.  There is no evidence of perforation or abscess formation at this time.  There is diffuse diverticulosis along the proximal sigmoid colon, and more mild diverticulosis along the descending colon.  No significant inguinal hernia is seen.  There has been interval repair of the previously noted small anterior midline abdominal wall hernia.  The bladder is moderately distended  and grossly unremarkable in appearance.  The patient is status post hysterectomy.  No suspicious adnexal masses are seen.  No inguinal lymphadenopathy is seen.  No acute osseous abnormalities are identified.  There is disc space narrowing and vacuum phenomenon at L3-L4 and L4-L5.  IMPRESSION:  1.  Acute diverticulitis at the distal descending colon, with trace free fluid, focal soft tissue inflammation and mild colonic wall thickening.  No evidence of perforation or abscess formation at this time. 2.  Diffuse diverticulosis along the proximal sigmoid colon, with more mild diverticulosis along the descending colon. 3.  No significant inguinal or anterior abdominal wall hernia seen at this time.   Original Report Authenticated By: Tonia Ghent, M.D.    IV fentanyl. IV Zofran. IV fluids  3:25 AM patient still with mild symptoms after medications. No fevers. No emesis. CT results shared with patient and she very much prefers to be discharged home. I did offer admission. She tolerates oral antibiotics. She has had diverticulitis in the past and agrees to strict return precautions for any fevers, severe pain or emesis.  MDM  Left lower quadrant abdominal pain with diverticulitis on CT scan reviewed as above.  Labs obtained and reviewed, has mild leukocytosis.  Improved with IV narcotics  Vital  signs and nursing notes reviewed and considered  Sunnie Nielsen, MD 05/28/13 [phone]

## 2013-05-28 ENCOUNTER — Encounter (HOSPITAL_COMMUNITY): Payer: Self-pay

## 2013-05-28 LAB — COMPREHENSIVE METABOLIC PANEL
ALT: 24 U/L (ref 0–35)
AST: 21 U/L (ref 0–37)
Albumin: 3.9 g/dL (ref 3.5–5.2)
Alkaline Phosphatase: 125 U/L — ABNORMAL HIGH (ref 39–117)
BUN: 18 mg/dL (ref 6–23)
Chloride: 102 mEq/L (ref 96–112)
Potassium: 4.4 mEq/L (ref 3.5–5.1)
Sodium: 138 mEq/L (ref 135–145)
Total Bilirubin: 0.3 mg/dL (ref 0.3–1.2)
Total Protein: 7.3 g/dL (ref 6.0–8.3)

## 2013-05-28 LAB — URINALYSIS, ROUTINE W REFLEX MICROSCOPIC
Bilirubin Urine: NEGATIVE
Leukocytes, UA: NEGATIVE
Nitrite: NEGATIVE
Specific Gravity, Urine: 1.011 (ref 1.005–1.030)
Urobilinogen, UA: 0.2 mg/dL (ref 0.0–1.0)
pH: 5.5 (ref 5.0–8.0)

## 2013-05-28 LAB — CBC
HCT: 35 % — ABNORMAL LOW (ref 36.0–46.0)
MCHC: 33.4 g/dL (ref 30.0–36.0)
Platelets: 235 10*3/uL (ref 150–400)
RDW: 13.1 % (ref 11.5–15.5)
WBC: 12.3 10*3/uL — ABNORMAL HIGH (ref 4.0–10.5)

## 2013-05-28 LAB — POCT I-STAT, CHEM 8
Chloride: 105 mEq/L (ref 96–112)
HCT: 35 % — ABNORMAL LOW (ref 36.0–46.0)
Hemoglobin: 11.9 g/dL — ABNORMAL LOW (ref 12.0–15.0)
Potassium: 4.4 mEq/L (ref 3.5–5.1)
Sodium: 139 mEq/L (ref 135–145)

## 2013-05-28 LAB — LIPASE, BLOOD: Lipase: 23 U/L (ref 11–59)

## 2013-05-28 MED ORDER — AMOXICILLIN-POT CLAVULANATE 875-125 MG PO TABS: 1.0000 | ORAL_TABLET | Freq: Two times a day (BID) | ORAL | Status: DC

## 2013-05-28 MED ORDER — OXYCODONE-ACETAMINOPHEN 5-325 MG PO TABS
2.0000 | ORAL_TABLET | ORAL | Status: DC | PRN
Start: 2013-05-28 — End: 2013-07-04

## 2013-05-28 MED ORDER — ONDANSETRON 8 MG PO TBDP: 8.0000 mg | ORAL_TABLET | Freq: Three times a day (TID) | ORAL | Status: DC | PRN

## 2013-05-28 MED ORDER — AMOXICILLIN-POT CLAVULANATE 875-125 MG PO TABS
1.0000 | ORAL_TABLET | Freq: Once | ORAL | Status: AC
  Administered 2013-05-28: 1 via ORAL
  Filled 2013-05-28: qty 1

## 2013-05-29 ENCOUNTER — Inpatient Hospital Stay (HOSPITAL_COMMUNITY)
Admission: EM | Admit: 2013-05-29 | Discharge: 2013-05-31 | DRG: 392 | Disposition: A | Discharge status: Home / Self Care | Payer: Medicare Other | Attending: Family Medicine | Admitting: Family Medicine

## 2013-05-29 ENCOUNTER — Encounter (HOSPITAL_COMMUNITY): Payer: Self-pay | Admitting: Emergency Medicine

## 2013-05-29 DIAGNOSIS — R1032 Left lower quadrant pain: Secondary | ICD-10-CM

## 2013-05-29 DIAGNOSIS — N189 Chronic kidney disease, unspecified: Secondary | ICD-10-CM | POA: Diagnosis present

## 2013-05-29 DIAGNOSIS — I129 Hypertensive chronic kidney disease with stage 1 through stage 4 chronic kidney disease, or unspecified chronic kidney disease: Secondary | ICD-10-CM | POA: Diagnosis present

## 2013-05-29 DIAGNOSIS — N179 Acute kidney failure, unspecified: Secondary | ICD-10-CM | POA: Diagnosis present

## 2013-05-29 DIAGNOSIS — F411 Generalized anxiety disorder: Secondary | ICD-10-CM | POA: Diagnosis present

## 2013-05-29 DIAGNOSIS — R569 Unspecified convulsions: Secondary | ICD-10-CM | POA: Diagnosis present

## 2013-05-29 DIAGNOSIS — R112 Nausea with vomiting, unspecified: Secondary | ICD-10-CM

## 2013-05-29 DIAGNOSIS — F17200 Nicotine dependence, unspecified, uncomplicated: Secondary | ICD-10-CM | POA: Diagnosis present

## 2013-05-29 DIAGNOSIS — E785 Hyperlipidemia, unspecified: Secondary | ICD-10-CM | POA: Diagnosis present

## 2013-05-29 DIAGNOSIS — Z79899 Other long term (current) drug therapy: Secondary | ICD-10-CM

## 2013-05-29 DIAGNOSIS — G4730 Sleep apnea, unspecified: Secondary | ICD-10-CM | POA: Diagnosis present

## 2013-05-29 DIAGNOSIS — I10 Essential (primary) hypertension: Secondary | ICD-10-CM

## 2013-05-29 DIAGNOSIS — J4489 Other specified chronic obstructive pulmonary disease: Secondary | ICD-10-CM | POA: Diagnosis present

## 2013-05-29 DIAGNOSIS — K5792 Diverticulitis of intestine, part unspecified, without perforation or abscess without bleeding: Secondary | ICD-10-CM

## 2013-05-29 DIAGNOSIS — Z7982 Long term (current) use of aspirin: Secondary | ICD-10-CM

## 2013-05-29 DIAGNOSIS — IMO0001 Reserved for inherently not codable concepts without codable children: Secondary | ICD-10-CM | POA: Diagnosis present

## 2013-05-29 DIAGNOSIS — E119 Type 2 diabetes mellitus without complications: Secondary | ICD-10-CM | POA: Diagnosis present

## 2013-05-29 DIAGNOSIS — D649 Anemia, unspecified: Secondary | ICD-10-CM | POA: Diagnosis present

## 2013-05-29 DIAGNOSIS — F329 Major depressive disorder, single episode, unspecified: Secondary | ICD-10-CM | POA: Diagnosis present

## 2013-05-29 DIAGNOSIS — F3289 Other specified depressive episodes: Secondary | ICD-10-CM | POA: Diagnosis present

## 2013-05-29 DIAGNOSIS — J449 Chronic obstructive pulmonary disease, unspecified: Secondary | ICD-10-CM | POA: Diagnosis present

## 2013-05-29 DIAGNOSIS — K5732 Diverticulitis of large intestine without perforation or abscess without bleeding: Principal | ICD-10-CM | POA: Diagnosis present

## 2013-05-29 DIAGNOSIS — D72829 Elevated white blood cell count, unspecified: Secondary | ICD-10-CM | POA: Diagnosis present

## 2013-05-29 HISTORY — DX: Sleep apnea, unspecified: G47.30

## 2013-05-29 LAB — COMPREHENSIVE METABOLIC PANEL
ALT: 24 U/L (ref 0–35)
AST: 18 U/L (ref 0–37)
Albumin: 3.7 g/dL (ref 3.5–5.2)
Alkaline Phosphatase: 127 U/L — ABNORMAL HIGH (ref 39–117)
BUN: 13 mg/dL (ref 6–23)
CO2: 26 mEq/L (ref 19–32)
Calcium: 9.8 mg/dL (ref 8.4–10.5)
Chloride: 101 mEq/L (ref 96–112)
Creatinine, Ser: 1.03 mg/dL (ref 0.50–1.10)
GFR calc Af Amer: 65 mL/min — ABNORMAL LOW (ref >90)
GFR calc non Af Amer: 56 mL/min — ABNORMAL LOW (ref >90)
Glucose, Bld: 136 mg/dL — ABNORMAL HIGH (ref 70–99)
Potassium: 4.6 mEq/L (ref 3.5–5.1)
Sodium: 137 mEq/L (ref 135–145)
Total Bilirubin: 0.3 mg/dL (ref 0.3–1.2)
Total Protein: 7.2 g/dL (ref 6.0–8.3)

## 2013-05-29 LAB — CBC WITH DIFFERENTIAL/PLATELET
Basophils Absolute: 0.1 10*3/uL (ref 0.0–0.1)
Basophils Relative: 1 % (ref 0–1)
Eosinophils Absolute: 0.1 10*3/uL (ref 0.0–0.7)
Eosinophils Relative: 1 % (ref 0–5)
HCT: 34.9 % — ABNORMAL LOW (ref 36.0–46.0)
Hemoglobin: 11.4 g/dL — ABNORMAL LOW (ref 12.0–15.0)
Lymphocytes Relative: 20 % (ref 12–46)
Lymphs Abs: 1.6 10*3/uL (ref 0.7–4.0)
MCH: 28.8 pg (ref 26.0–34.0)
MCHC: 32.7 g/dL (ref 30.0–36.0)
MCV: 88.1 fL (ref 78.0–100.0)
Monocytes Absolute: 0.4 10*3/uL (ref 0.1–1.0)
Monocytes Relative: 5 % (ref 3–12)
Neutro Abs: 5.8 10*3/uL (ref 1.7–7.7)
Neutrophils Relative %: 73 % (ref 43–77)
Platelets: 235 10*3/uL (ref 150–400)
RBC: 3.96 MIL/uL (ref 3.87–5.11)
RDW: 12.7 % (ref 11.5–15.5)
WBC: 8 10*3/uL (ref 4.0–10.5)

## 2013-05-29 LAB — URINALYSIS, ROUTINE W REFLEX MICROSCOPIC
Bilirubin Urine: NEGATIVE
Glucose, UA: NEGATIVE mg/dL
Hgb urine dipstick: NEGATIVE
Ketones, ur: NEGATIVE mg/dL
Leukocytes, UA: NEGATIVE
Nitrite: NEGATIVE
Protein, ur: NEGATIVE mg/dL
Specific Gravity, Urine: 1.016 (ref 1.005–1.030)
Urobilinogen, UA: 0.2 mg/dL (ref 0.0–1.0)
pH: 7.5 (ref 5.0–8.0)

## 2013-05-29 LAB — GLUCOSE, CAPILLARY: Glucose-Capillary: 111 mg/dL — ABNORMAL HIGH (ref 70–99)

## 2013-05-29 MED ORDER — HYDROMORPHONE HCL PF 1 MG/ML IJ SOLN: 1.0000 mg | INTRAMUSCULAR | Status: DC | PRN

## 2013-05-29 MED ORDER — METRONIDAZOLE IN NACL 5-0.79 MG/ML-% IV SOLN: 500.0000 mg | Freq: Three times a day (TID) | INTRAVENOUS | Status: DC

## 2013-05-29 MED ORDER — OXYCODONE-ACETAMINOPHEN 5-325 MG PO TABS: 2.0000 | ORAL_TABLET | ORAL | Status: DC | PRN

## 2013-05-29 MED ORDER — ACETAMINOPHEN 325 MG PO TABS: 650.0000 mg | ORAL_TABLET | Freq: Four times a day (QID) | ORAL | Status: DC | PRN

## 2013-05-29 MED ORDER — ONDANSETRON HCL 4 MG PO TABS: 4.0000 mg | ORAL_TABLET | Freq: Four times a day (QID) | ORAL | Status: DC | PRN

## 2013-05-29 MED ORDER — SODIUM CHLORIDE 0.9 % IV BOLUS (SEPSIS)
1000.0000 mL | Freq: Once | INTRAVENOUS | Status: AC
  Administered 2013-05-29: 1000 mL via INTRAVENOUS

## 2013-05-29 MED ORDER — INSULIN ASPART 100 UNIT/ML ~~LOC~~ SOLN: 0.0000 [IU] | Freq: Every day | SUBCUTANEOUS | Status: DC

## 2013-05-29 MED ORDER — MOMETASONE FURO-FORMOTEROL FUM 100-5 MCG/ACT IN AERO
2.0000 | INHALATION_SPRAY | Freq: Two times a day (BID) | RESPIRATORY_TRACT | Status: DC
  Administered 2013-05-29 – 2013-05-31 (×4): 2 via RESPIRATORY_TRACT
  Filled 2013-05-29: qty 8.8

## 2013-05-29 MED ORDER — ATORVASTATIN CALCIUM 20 MG PO TABS
20.0000 mg | ORAL_TABLET | Freq: Every day | ORAL | Status: DC
  Administered 2013-05-30 – 2013-05-31 (×2): 20 mg via ORAL
  Filled 2013-05-29 (×2): qty 1

## 2013-05-29 MED ORDER — METOPROLOL SUCCINATE ER 50 MG PO TB24
50.0000 mg | ORAL_TABLET | Freq: Every day | ORAL | Status: DC
  Administered 2013-05-30 – 2013-05-31 (×2): 50 mg via ORAL
  Filled 2013-05-29 (×3): qty 1

## 2013-05-29 MED ORDER — PIPERACILLIN-TAZOBACTAM 3.375 G IVPB
3.3750 g | Freq: Three times a day (TID) | INTRAVENOUS | Status: DC
  Administered 2013-05-29 – 2013-05-31 (×5): 3.375 g via INTRAVENOUS
  Filled 2013-05-29 (×6): qty 50

## 2013-05-29 MED ORDER — CYCLOBENZAPRINE HCL 10 MG PO TABS
10.0000 mg | ORAL_TABLET | Freq: Every day | ORAL | Status: DC
  Administered 2013-05-29 – 2013-05-30 (×2): 10 mg via ORAL
  Filled 2013-05-29 (×3): qty 1

## 2013-05-29 MED ORDER — LISINOPRIL 40 MG PO TABS
40.0000 mg | ORAL_TABLET | ORAL | Status: DC
  Administered 2013-05-29 – 2013-05-30 (×2): 40 mg via ORAL
  Filled 2013-05-29 (×3): qty 1

## 2013-05-29 MED ORDER — AMLODIPINE BESYLATE 10 MG PO TABS
10.0000 mg | ORAL_TABLET | Freq: Every evening | ORAL | Status: DC
  Administered 2013-05-30: 10 mg via ORAL
  Filled 2013-05-29 (×3): qty 1

## 2013-05-29 MED ORDER — ASPIRIN 81 MG PO CHEW
81.0000 mg | CHEWABLE_TABLET | Freq: Every day | ORAL | Status: DC
Start: 2013-05-30 — End: 2013-05-31
  Administered 2013-05-30 – 2013-05-31 (×2): 81 mg via ORAL
  Filled 2013-05-29 (×2): qty 1

## 2013-05-29 MED ORDER — SODIUM CHLORIDE 0.9 % IV SOLN
INTRAVENOUS | Status: DC
  Administered 2013-05-30: 125 mL/h via INTRAVENOUS
  Administered 2013-05-30: 13:00:00 via INTRAVENOUS
  Administered 2013-05-30 – 2013-05-31 (×2): 125 mL/h via INTRAVENOUS

## 2013-05-29 MED ORDER — INSULIN ASPART 100 UNIT/ML ~~LOC~~ SOLN: 0.0000 [IU] | Freq: Three times a day (TID) | SUBCUTANEOUS | Status: DC

## 2013-05-29 MED ORDER — ADULT MULTIVITAMIN W/MINERALS CH
1.0000 | ORAL_TABLET | Freq: Every day | ORAL | Status: DC
  Administered 2013-05-30 – 2013-05-31 (×2): 1 via ORAL
  Filled 2013-05-29 (×2): qty 1

## 2013-05-29 MED ORDER — TRAMADOL HCL 50 MG PO TABS: 50.0000 mg | ORAL_TABLET | Freq: Four times a day (QID) | ORAL | Status: DC | PRN

## 2013-05-29 MED ORDER — DEXTROSE 5 % IV SOLN: 1.0000 g | Freq: Once | INTRAVENOUS | Status: DC

## 2013-05-29 MED ORDER — METRONIDAZOLE IN NACL 5-0.79 MG/ML-% IV SOLN
500.0000 mg | Freq: Once | INTRAVENOUS | Status: AC
  Administered 2013-05-29: 500 mg via INTRAVENOUS
  Filled 2013-05-29: qty 100

## 2013-05-29 MED ORDER — ONDANSETRON HCL 4 MG/2ML IJ SOLN
4.0000 mg | Freq: Once | INTRAMUSCULAR | Status: AC
  Administered 2013-05-29: 4 mg via INTRAVENOUS
  Filled 2013-05-29: qty 2

## 2013-05-29 MED ORDER — ACETAMINOPHEN 650 MG RE SUPP: 650.0000 mg | Freq: Four times a day (QID) | RECTAL | Status: DC | PRN

## 2013-05-29 MED ORDER — ONDANSETRON HCL 4 MG/2ML IJ SOLN
4.0000 mg | Freq: Four times a day (QID) | INTRAMUSCULAR | Status: DC | PRN
  Administered 2013-05-29: 4 mg via INTRAVENOUS
  Filled 2013-05-29: qty 2

## 2013-05-29 NOTE — ED Notes (Signed)
Pt was seen in ED on Monday and diagnosed with diverticulitis. Pt states she has had worsening nausea and vomiting since this morning. Pt took Zofran at 1300 and is still nauseous and vomiting. Pt also c/o pain to LLQ. Pt BIB friend. Pt arrives to exam room in wheelchair.

## 2013-05-29 NOTE — Progress Notes (Signed)
Pt placed on cpap 8 cmH2O with nasal mask at this time. Pt said she's been wearing cpap since 1994. She wears nasal prongs at home, but seems to be tolerating the nasal mask well a this time.  Jacqulynn Cadet RRT

## 2013-05-29 NOTE — H&P (Addendum)
[facility] History and Physical  Angela [name] ZOX:[id number] DOB: 1945-12-14 DOA: 05/29/2013  Referring physician: ER physician PCP: Leanor Rubenstein, MD   Chief Complaint: abdominal pain  HPI:  67 year old female with past medical history of hypertension, diabetes and dyslipidemia, seen in ED 2 days ago for left lower abdominal pain and found to have acute diverticulitits based on CT abdomen. She was discharged on ciipro and flagyl but ultimately came back as she continued to have nausea and vomiting as well as progressively worsening left lower abdominal pain. She described her pain as sharp and constant and non radiating, 10/10 in intensity. No diarrhea. No fever or chills. No chest pain, no palpitations. She tried zofran at home but could not keep anything down. In ED, her vitals remain stable. CBC revealed mild anemia with hemoglobin of 11.4. BMP was unremarkable. CT abdomen was done 05/27/2013 with findings of acute diverticulitis. No other imaging studies obtained at the time of admission.  Assessment and Plan:   Principal Problem:  Acute diverticulitis  - continue zosyn IV (allergic to cipro) per pharmacy recomendations - NPO for now  - continue supportive care with IV fluids and analgesia  - continue antiemetics prn  Abdominal pain, left lower quadrant, nausea and vomiting  - secondary to acute diverticulitis  - management as above with zosyn, fluids and analgesia  - antiemetics prn Active Problems:  HTN (hypertension)  - continue lisinopril and norvasc  - continue to monitor renal function while on these meds  - continue metoprolol  Leukocytosis  - secondary to acute diverticulosis  Acute renal failure  - resolved on repeat blood work analysis  Anemia  - hemoglobin 11.4 on admission  - no signs of bleed  Diabetes  - hold metformin for now due to an acute infection and recent contrast administration  - sliding scale insulin ordered  - check A1c  Dyslipidemia  -  continue lipitor   Code Status: Full  Family Communication: Pt at bedside  Disposition Plan: Admit for further evaluation   Manson Passey, MD  [facility]  Pager [phone]   If 7PM-7AM, please contact night-coverage  [email or web address]  Password [email or web address]  05/29/2013, 8:32 PM    Review of Systems:  Constitutional: Negative for fever, chills and malaise/fatigue. Negative for diaphoresis.  HENT: Negative for hearing loss, ear pain, nosebleeds, congestion, sore throat, neck pain, tinnitus and ear discharge.  Eyes: Negative for blurred vision, double vision, photophobia, pain, discharge and redness.  Respiratory: Negative for cough, hemoptysis, sputum production, shortness of breath, wheezing and stridor.  Cardiovascular: Negative for chest pain, palpitations, orthopnea, claudication and leg swelling.  Gastrointestinal: positive for abdominal pain, nausea and vomiting, no blood in stool  Genitourinary: Negative for dysuria, urgency, frequency, hematuria and flank pain.  Musculoskeletal: Negative for myalgias, back pain, joint pain and falls.  Skin: Negative for itching and rash.  Neurological: Negative for dizziness and weakness. Negative for tingling, tremors, sensory change, speech change, focal weakness, loss of consciousness and headaches.  Endo/Heme/Allergies: Negative for environmental allergies and polydipsia. Does not bruise/bleed easily.  Psychiatric/Behavioral: Negative for suicidal ideas. The patient is not nervous/anxious.  Past Medical History   Diagnosis  Date   .  Diabetes mellitus    .  Hypertension    .  Seizures    .  Depression      with anxiety   .  Chronic kidney disease      chronic   .  DDD (degenerative disc disease)    .  Fibromyalgia    .  COPD (chronic obstructive pulmonary disease)     Past Surgical History   Procedure  Laterality  Date   .  Appendectomy     .  Tonsillectomy     .  Gastric fundoplication     .  Back surgery     .  Hernia repair      Social  History: reports that she has been smoking. She has never used smokeless tobacco. She reports that she does not drink alcohol or use illicit drugs.  Allergies   Allergen  Reactions   .  Aspirin  Other (See Comments)     Turns tongue black   .  Codeine  Nausea And Vomiting   .  Iohexol      Desc: Pt states several years ago during a CT scan w/ iv cotnrast she had severe nausea and vomiting, sob w/ difficulty breathing and swallowing. She had full 13hr. premeds today (5/14/7) and did fine w/o complications.   .  Ciprofloxacin  Rash    Family medical history significant for HTN, HLD  Prior to Admission medications   Medication  Sig  Start Date  End Date  Taking?  Authorizing Provider   amLODipine (NORVASC) 10 MG tablet  Take 10 mg by mouth every evening.    Yes  Historical Provider, MD   amoxicillin-clavulanate (AUGMENTIN) 875-125 MG per tablet  Take 1 tablet by mouth 2 (two) times daily.  05/28/13   Yes  Sunnie Nielsen, MD   aspirin 81 MG chewable tablet  Chew 1 tablet (81 mg total) by mouth daily.  04/15/13   Yes  Catarina Hartshorn, MD   atorvastatin (LIPITOR) 20 MG tablet  Take 20 mg by mouth daily.    Yes  Historical Provider, MD   cyclobenzaprine (FLEXERIL) 10 MG tablet  Take 10 mg by mouth at bedtime. For muscle spasm    Yes  Historical Provider, MD   Fluticasone-Salmeterol (ADVAIR) 100-50 MCG/DOSE AEPB  Inhale 1 puff into the lungs every 12 (twelve) hours.    Yes  Historical Provider, MD   lisinopril (PRINIVIL,ZESTRIL) 40 MG tablet  Take 1 tablet (40 mg total) by mouth daily.  04/15/13   Yes  Catarina Hartshorn, MD   metFORMIN (GLUCOPHAGE) 500 MG tablet  Take 500 mg by mouth 2 (two) times daily with a meal.    Yes  Historical Provider, MD   metoprolol succinate (TOPROL-XL) 50 MG 24 hr tablet  Take 50 mg by mouth daily. Take with or immediately following a meal.    Yes  Historical Provider, MD   Multiple Vitamin (MULTIVITAMIN WITH MINERALS) TABS  Take 1 tablet by mouth daily.    Yes  Historical Provider, MD    ondansetron (ZOFRAN ODT) 8 MG disintegrating tablet  Take 1 tablet (8 mg total) by mouth every 8 (eight) hours as needed for nausea.  05/28/13   Yes  Sunnie Nielsen, MD   oxyCODONE-acetaminophen (PERCOCET/ROXICET) 5-325 MG per tablet  Take 2 tablets by mouth every 4 (four) hours as needed for pain.  05/28/13   Yes  Sunnie Nielsen, MD   traMADol (ULTRAM) 50 MG tablet  Take 50 mg by mouth every 6 (six) hours as needed for pain.    Yes  Historical Provider, MD   Physical Exam:  Filed Vitals:    05/29/13 1550  05/29/13 1757   BP:  139/57  126/87   Pulse:  62  69   Temp:  97.9 F (  36.6 C)    TempSrc:  Oral    Resp:  17  16   SpO2:  92%  95%    Physical Exam  Constitutional: Appears well-developed and well-nourished. No distress.  HENT: Normocephalic. External right and left ear normal. Oropharynx is clear and moist.  Eyes: Conjunctivae and EOM are normal. PERRLA, no scleral icterus.  Neck: Normal ROM. Neck supple. No JVD. No tracheal deviation. No thyromegaly.  CVS: RRR, S1/S2 +, no murmurs, no gallops, no carotid bruit.  Pulmonary: Effort and breath sounds normal, no stridor, rhonchi, wheezes, rales.  Abdominal: Soft. BS +, no distension, tenderness appreciated in left lower quadrant, no rebound or guarding.  Musculoskeletal: Normal range of motion. No edema and no tenderness.  Lymphadenopathy: No lymphadenopathy noted, cervical, inguinal. Neuro: Alert. Normal reflexes, muscle tone coordination. No cranial nerve deficit.  Skin: Skin is warm and dry. No rash noted. Not diaphoretic. No erythema. No pallor.  Psychiatric: Normal mood and affect. Behavior, judgment, thought content normal.  Labs on Admission:  Basic Metabolic Panel:   Recent Labs  Lab  05/27/13 2345  05/28/13 0006  05/29/13 1615   NA  138  139  137   K  4.4  4.4  4.6   CL  102  105  101   CO2  24  --  26   GLUCOSE  88  91  136*   BUN  18  19  13    CREATININE  1.18*  1.20*  1.03   CALCIUM  9.7  --  9.8    Liver Function  Tests:   Recent Labs  Lab  05/27/13 2345  05/29/13 1615   AST  21  18   ALT  24  24   ALKPHOS  125*  127*   BILITOT  0.3  0.3   PROT  7.3  7.2   ALBUMIN  3.9  3.7     Recent Labs  Lab  05/27/13 2345   LIPASE  23    No results found for this basename: AMMONIA, in the last 168 hours  CBC:   Recent Labs  Lab  05/27/13 2345  05/28/13 0006  05/29/13 1615   WBC  12.3*  --  8.0   NEUTROABS  --  --  5.8   HGB  11.7*  11.9*  11.4*   HCT  35.0*  35.0*  34.9*   MCV  87.3  --  88.1   PLT  235  --  235    Cardiac Enzymes:  No results found for this basename: CKTOTAL, CKMB, CKMBINDEX, TROPONINI, in the last 168 hours  BNP:  No components found with this basename: POCBNP,  CBG:  No results found for this basename: GLUCAP, in the last 168 hours  Radiological Exams on Admission:  Ct Abdomen Pelvis Wo Contrast  05/28/2013 *RADIOLOGY REPORT* Clinical Data: Right-sided inguinal hernia pain. CT ABDOMEN AND PELVIS WITHOUT CONTRAST Technique: Multidetector CT imaging of the abdomen and pelvis was performed following the standard protocol without intravenous contrast. Comparison: CT of the abdomen and pelvis performed 03/27/2006 Findings: Minimal bibasilar atelectasis is noted. Postoperative change is noted about the gastroesophageal junction, reflecting prior fundoplication. The liver and spleen are unremarkable in appearance. The gallbladder is within normal limits. The pancreas and adrenal glands are unremarkable. Nonspecific perinephric stranding is noted bilaterally. There is mild right-sided pelvicaliectasis, without significant hydronephrosis. The kidneys are otherwise unremarkable in appearance. No renal or ureteral stones are identified. No free fluid is identified.  The small bowel is unremarkable in appearance. The stomach is within normal limits. No acute vascular abnormalities are seen. Mild scattered calcification is noted along the abdominal aorta and its branches. The patient is  status post appendectomy. Focal soft tissue inflammation is noted along the distal descending colon, with trace associated free fluid and mild colonic wall thickening. Associated inflamed diverticula are seen, with acute diverticulitis. There is no evidence of perforation or abscess formation at this time. There is diffuse diverticulosis along the proximal sigmoid colon, and more mild diverticulosis along the descending colon. No significant inguinal hernia is seen. There has been interval repair of the previously noted small anterior midline abdominal wall hernia. The bladder is moderately distended and grossly unremarkable in appearance. The patient is status post hysterectomy. No suspicious adnexal masses are seen. No inguinal lymphadenopathy is seen. No acute osseous abnormalities are identified. There is disc space narrowing and vacuum phenomenon at L3-L4 and L4-L5. IMPRESSION: 1. Acute diverticulitis at the distal descending colon, with trace free fluid, focal soft tissue inflammation and mild colonic wall thickening. No evidence of perforation or abscess formation at this time. 2. Diffuse diverticulosis along the proximal sigmoid colon, with more mild diverticulosis along the descending colon. 3. No significant inguinal or anterior abdominal wall hernia seen at this time. Original Report Authenticated By: Tonia Ghent, M.D.   EKG: Normal sinus rhythm, no ST/T wave changes  Time spent: 75 minutes

## 2013-05-29 NOTE — ED Notes (Signed)
Pt states she feels better. States nausea has decreased. Pt states pain has also decreased. Pt ambulatory to BR to obtain urine sample.

## 2013-05-29 NOTE — ED Notes (Signed)
Pt drinking Ginger Ale. Pt states she is not nauseous any more. Pt resting quietly with no complaints at this time.

## 2013-05-30 ENCOUNTER — Encounter (HOSPITAL_COMMUNITY): Payer: Self-pay | Admitting: Internal Medicine

## 2013-05-30 DIAGNOSIS — D649 Anemia, unspecified: Secondary | ICD-10-CM

## 2013-05-30 DIAGNOSIS — K5732 Diverticulitis of large intestine without perforation or abscess without bleeding: Principal | ICD-10-CM

## 2013-05-30 LAB — CBC WITH DIFFERENTIAL/PLATELET
Eosinophils Relative: 1 % (ref 0–5)
HCT: 35.2 % — ABNORMAL LOW (ref 36.0–46.0)
Lymphocytes Relative: 23 % (ref 12–46)
Lymphs Abs: 1.9 10*3/uL (ref 0.7–4.0)
MCV: 87.1 fL (ref 78.0–100.0)
Monocytes Absolute: 0.5 10*3/uL (ref 0.1–1.0)
Monocytes Relative: 6 % (ref 3–12)
RBC: 4.04 MIL/uL (ref 3.87–5.11)
WBC: 8.4 10*3/uL (ref 4.0–10.5)

## 2013-05-30 LAB — CBC
HCT: 34.5 % — ABNORMAL LOW (ref 36.0–46.0)
MCH: 28.5 pg (ref 26.0–34.0)
MCHC: 32.8 g/dL (ref 30.0–36.0)
RDW: 12.7 % (ref 11.5–15.5)

## 2013-05-30 LAB — COMPREHENSIVE METABOLIC PANEL
ALT: 21 U/L (ref 0–35)
AST: 17 U/L (ref 0–37)
Albumin: 3.4 g/dL — ABNORMAL LOW (ref 3.5–5.2)
Alkaline Phosphatase: 118 U/L — ABNORMAL HIGH (ref 39–117)
CO2: 26 mEq/L (ref 19–32)
Calcium: 9.7 mg/dL (ref 8.4–10.5)
Chloride: 105 mEq/L (ref 96–112)
Creatinine, Ser: 0.94 mg/dL (ref 0.50–1.10)
GFR calc Af Amer: 68 mL/min — ABNORMAL LOW (ref >90)
GFR calc non Af Amer: 59 mL/min — ABNORMAL LOW (ref >90)
Glucose, Bld: 124 mg/dL — ABNORMAL HIGH (ref 70–99)
Potassium: 4.6 mEq/L (ref 3.5–5.1)
Sodium: 139 mEq/L (ref 135–145)
Total Bilirubin: 0.3 mg/dL (ref 0.3–1.2)

## 2013-05-30 LAB — MAGNESIUM: Magnesium: 2 mg/dL (ref 1.5–2.5)

## 2013-05-30 LAB — GLUCOSE, CAPILLARY: Glucose-Capillary: 106 mg/dL — ABNORMAL HIGH (ref 70–99)

## 2013-05-30 LAB — PHOSPHORUS: Phosphorus: 3.3 mg/dL (ref 2.3–4.6)

## 2013-05-30 MED ORDER — PROMETHAZINE HCL 25 MG/ML IJ SOLN: 25.0000 mg | Freq: Four times a day (QID) | INTRAMUSCULAR | Status: DC | PRN

## 2013-05-30 NOTE — Consult Note (Signed)
Referring Provider: No ref. provider found Primary Care Physician:  Leanor Rubenstein, MD Primary Gastroenterologist:  Dr. Juanda Chance  Reason for Consultation:  Diverticulitis   HPI: Angela [name] is a 67 y.o. female with past medical history of hypertension, diabetes and dyslipidemia.  She was seen in ED 3 days ago for left lower abdominal pain and was found to have acute diverticulitits based on CT abdomen. She was discharged on Augmentin, but ultimately came back as she continued to have nausea and vomiting as well as progressively worsening left lower abdominal pain. She described her pain as sharp and constant and non-radiating, >10/10 in intensity. No diarrhea. No fever or chills. No chest pain, no palpitations. She tried zofran at home but could not keep anything down.  In ED, her vitals remained stable. CBC did not reveal leukocytosis.  BMP was unremarkable. CT abdomen was done 05/27/2013 with findings of acute diverticulitis. No other imaging studies obtained at the time of admission.   She had colonoscopy by Dr. Juanda Chance in 2001, which was normal at that time (except for some diverticulosis).  She did not return for repeat procedure since that time despite attempts to contact her from our office.    Past Medical History  Diagnosis Date  . Diabetes mellitus   . Hypertension   . Seizures   . Depression     with anxiety  . Chronic kidney disease     chronic  . DDD (degenerative disc disease)   . Fibromyalgia   . COPD (chronic obstructive pulmonary disease)   . Sleep apnea     wears CPAP    Past Surgical History  Procedure Laterality Date  . Appendectomy    . Tonsillectomy    . Gastric fundoplication    . Back surgery    . Hernia repair      Prior to Admission medications   Medication Sig Start Date End Date Taking? Authorizing Provider  amLODipine (NORVASC) 10 MG tablet Take 10 mg by mouth every evening.    Yes Historical Provider, MD  amoxicillin-clavulanate (AUGMENTIN) 875-125 MG  per tablet Take 1 tablet by mouth 2 (two) times daily. 05/28/13  Yes Sunnie Nielsen, MD  aspirin 81 MG chewable tablet Chew 1 tablet (81 mg total) by mouth daily. 04/15/13  Yes Catarina Hartshorn, MD  atorvastatin (LIPITOR) 20 MG tablet Take 20 mg by mouth daily.   Yes Historical Provider, MD  cyclobenzaprine (FLEXERIL) 10 MG tablet Take 10 mg by mouth at bedtime. For muscle spasm   Yes Historical Provider, MD  Fluticasone-Salmeterol (ADVAIR) 100-50 MCG/DOSE AEPB Inhale 1 puff into the lungs every 12 (twelve) hours.   Yes Historical Provider, MD  lisinopril (PRINIVIL,ZESTRIL) 40 MG tablet Take 1 tablet (40 mg total) by mouth daily. 04/15/13  Yes Catarina Hartshorn, MD  metFORMIN (GLUCOPHAGE) 500 MG tablet Take 500 mg by mouth 2 (two) times daily with a meal.   Yes Historical Provider, MD  metoprolol succinate (TOPROL-XL) 50 MG 24 hr tablet Take 50 mg by mouth daily. Take with or immediately following a meal.   Yes Historical Provider, MD  Multiple Vitamin (MULTIVITAMIN WITH MINERALS) TABS Take 1 tablet by mouth daily.   Yes Historical Provider, MD  ondansetron (ZOFRAN ODT) 8 MG disintegrating tablet Take 1 tablet (8 mg total) by mouth every 8 (eight) hours as needed for nausea. 05/28/13  Yes Sunnie Nielsen, MD  oxyCODONE-acetaminophen (PERCOCET/ROXICET) 5-325 MG per tablet Take 2 tablets by mouth every 4 (four) hours as needed for pain. 05/28/13  Yes Sunnie Nielsen, MD  traMADol (ULTRAM) 50 MG tablet Take 50 mg by mouth every 6 (six) hours as needed for pain.   Yes Historical Provider, MD    Current Facility-Administered Medications  Medication Dose Route Frequency Provider Last Rate Last Dose  . 0.9 %  sodium chloride infusion   Intravenous Continuous Alison Murray, MD 125 mL/hr at 05/30/13 1231    . acetaminophen (TYLENOL) tablet 650 mg  650 mg Oral Q6H PRN Alison Murray, MD       Or  . acetaminophen (TYLENOL) suppository 650 mg  650 mg Rectal Q6H PRN Alison Murray, MD      . amLODipine (NORVASC) tablet 10 mg  10 mg Oral QPM  Alison Murray, MD      . aspirin chewable tablet 81 mg  81 mg Oral Daily Alison Murray, MD   81 mg at 05/30/13 1124  . atorvastatin (LIPITOR) tablet 20 mg  20 mg Oral Daily Alison Murray, MD   20 mg at 05/30/13 1124  . cyclobenzaprine (FLEXERIL) tablet 10 mg  10 mg Oral QHS Alison Murray, MD   10 mg at 05/29/13 2241  . HYDROmorphone (DILAUDID) injection 1 mg  1 mg Intravenous Q2H PRN Alison Murray, MD      . insulin aspart (novoLOG) injection 0-15 Units  0-15 Units Subcutaneous TID WC Alison Murray, MD      . insulin aspart (novoLOG) injection 0-5 Units  0-5 Units Subcutaneous QHS Alison Murray, MD      . lisinopril (PRINIVIL,ZESTRIL) tablet 40 mg  40 mg Oral Q24H Alison Murray, MD   40 mg at 05/29/13 2241  . metoprolol succinate (TOPROL-XL) 24 hr tablet 50 mg  50 mg Oral Daily Alison Murray, MD   50 mg at 05/30/13 1124  . mometasone-formoterol (DULERA) 100-5 MCG/ACT inhaler 2 puff  2 puff Inhalation BID Alison Murray, MD   2 puff at 05/30/13 0736  . multivitamin with minerals tablet 1 tablet  1 tablet Oral Daily Alison Murray, MD   1 tablet at 05/30/13 1124  . piperacillin-tazobactam (ZOSYN) IVPB 3.375 g  3.375 g Intravenous Q8H Alison Murray, MD   3.375 g at 05/30/13 0622  . promethazine (PHENERGAN) injection 25 mg  25 mg Intravenous Q6H PRN Alison Murray, MD        Allergies as of 05/29/2013 - Review Complete 05/29/2013  Allergen Reaction Noted  . Aspirin Other (See Comments) 05/08/2012  . Codeine Nausea And Vomiting 05/08/2012  . Iohexol  03/27/2006  . Ciprofloxacin Rash 05/08/2012    No family history on file.  History   Social History  . Marital Status: Divorced    Spouse Name: N/A    Number of Children: N/A  . Years of Education: N/A   Occupational History  . Not on file.   Social History Main Topics  . Smoking status: Current Every Day Smoker  . Smokeless tobacco: Never Used  . Alcohol Use: No  . Drug Use: No  . Sexually Active: Yes    Birth Control/ Protection: None    Other Topics Concern  . Not on file   Social History Narrative  . No narrative on file    Review of Systems: Ten point ROS is O/W negative except as mentioned in HPI.  Physical Exam: Vital signs in last 24 hours: Temp:  [97.9 F (36.6 C)-98.5 F (36.9 C)] 98.5 F (36.9 C) (07/17 0530)  Pulse Rate:  [62-70] 70 (07/17 0530) Resp:  [16-18] 16 (07/17 0530) BP: (126-147)/(57-87) 147/68 mmHg (07/17 0530) SpO2:  [92 %-97 %] 95 % (07/17 0744) Weight:  [207 lb 10.8 oz (94.2 kg)] 207 lb 10.8 oz (94.2 kg) (07/17 0530) Last BM Date: 05/29/13 General:   Alert, Well-developed, well-nourished, pleasant and cooperative in NAD Head:  Normocephalic and atraumatic. Eyes:  Sclera clear, no icterus.  Conjunctiva pink. Ears:  Normal auditory acuity. Mouth:  No deformity or lesions.   Lungs:  Clear throughout to auscultation.  No wheezes, crackles, or rhonchi.  Heart:  Regular rate and rhythm; no murmurs, clicks, rubs,  or gallops. Abdomen:  Soft, non-distended.  BS present.  LLQ TTP without R/R/G. Rectal:  Deferred  Msk:  Symmetrical without gross deformities. Pulses:  Normal pulses noted. Extremities:  Without clubbing or edema. Neurologic:  Alert and  oriented x4;  grossly normal neurologically. Skin:  Intact without significant lesions or rashes. Psych:  Alert and cooperative. Normal mood and affect.  Intake/Output from previous day: 07/16 0701 - 07/17 0700 In: -  Out: 1200 [Urine:1200] Intake/Output this shift: Total I/O In: 780 [P.O.:780] Out: 1750 [Urine:1750]  Lab Results:  Recent Labs  05/29/13 1615 05/29/13 2259 05/30/13 0347  WBC 8.0 8.4 9.8  HGB 11.4* 11.5* 11.3*  HCT 34.9* 35.2* 34.5*  PLT 235 246 236   BMET  Recent Labs  05/29/13 1615 05/29/13 2259 05/30/13 0347  NA 137 141 139  K 4.6 4.5 4.6  CL 101 105 104  CO2 26 26 26   GLUCOSE 136* 115* 124*  BUN 13 12 11   CREATININE 1.03 0.94 0.99  CALCIUM 9.8 9.7 9.5   LFT  Recent Labs  05/30/13 0347   PROT 6.6  ALBUMIN 3.4*  AST 15  ALT 21  ALKPHOS 118*  BILITOT 0.3   PT/INR  Recent Labs  05/29/13 2259  LABPROT 12.8  INR 0.98   IMPRESSION/PLAN:  -Acute diverticulitis:  Continue Zosyn and flagyl (is allergic to cipro) for now.  Will need to complete 14 days of total antibiotics.  Continue clear liquid diet.  Will need outpatient follow-up to schedule colonoscopy upon discharge.   ZEHR, JESSICA D.  05/30/2013, 1:38 PM  Pager number [phone]  Chart was reviewed and patient was examined. X-rays and lab were reviewed.    I agree with management and plans. The patient has complicated acute diverticulitis which already is improving since her admission. May advance diet in a.m. if improvement continues.  Barbette Hair. Arlyce Dice, M.D., [facility] Cell [phone]

## 2013-05-30 NOTE — Progress Notes (Signed)
[facility] PROGRESS NOTE  Angela [name] WGN:[id number] DOB: [birth year] DOA: 05/29/2013 PCP: Leanor Rubenstein, MD  Assessment/Plan: 1. Acute diverticulitis- Continue on zosyn and flagyl. Clear liquid diet 2. Diabetes Mellitus- continue sliding scale insulin. 3. Hypertension- Continue Lisinopril, Metoprolol. 4. Anemia- Hb is stable. 5. DVT prophylaxis- SCD  Code Status: Full Code Family Communication: Discussed with patient Disposition Plan: Home when stable   Consultants:  GI  Procedures:  None   Antibiotics:  Zosyn 7/16>>  Flagyl 7/16>>  HPI/Subjective: Patient admitted with Diverticulitis, says she is hungry.  Objective: Filed Vitals:   05/29/13 2207 05/29/13 2300 05/30/13 0530 05/30/13 0744  BP: 145/58  147/68   Pulse: 64  70   Temp: 98.3 F (36.8 C)  98.5 F (36.9 C)   TempSrc: Oral  Oral   Resp: 16 18 16    Height: 5' 7.5" (1.715 m)     Weight: 94.2 kg (207 lb 10.8 oz)  94.2 kg (207 lb 10.8 oz)   SpO2: 97%  97% 95%    Intake/Output Summary (Last 24 hours) at 05/30/13 1356 Last data filed at 05/30/13 1027  Gross per 24 hour  Intake    780 ml  Output   2950 ml  Net  -2170 ml   Filed Weights   05/29/13 2207 05/30/13 0530  Weight: 94.2 kg (207 lb 10.8 oz) 94.2 kg (207 lb 10.8 oz)    Exam:   General:  Appear in no acute distress  Cardiovascular: S1s2 RRR  Respiratory: Clear bilaterally  Abdomen: Soft, mild tenderness in LLQ  Musculoskeletal: No edema  Data Reviewed: Basic Metabolic Panel:  Recent Labs Lab 05/27/13 2345 05/28/13 0006 05/29/13 1615 05/29/13 2259 05/30/13 0347  NA 138 139 137 141 139  K 4.4 4.4 4.6 4.5 4.6  CL 102 105 101 105 104  CO2 24  --  26 26 26   GLUCOSE 88 91 136* 115* 124*  BUN 18 19 13 12 11   CREATININE 1.18* 1.20* 1.03 0.94 0.99  CALCIUM 9.7  --  9.8 9.7 9.5  MG  --   --   --  2.0  --   PHOS  --   --   --  3.3  --    Liver Function Tests:  Recent Labs Lab 05/27/13 2345 05/29/13 1615  05/29/13 2259 05/30/13 0347  AST 21 18 17 15   ALT 24 24 21 21   ALKPHOS 125* 127* 124* 118*  BILITOT 0.3 0.3 0.3 0.3  PROT 7.3 7.2 7.0 6.6  ALBUMIN 3.9 3.7 3.4* 3.4*    Recent Labs Lab 05/27/13 2345  LIPASE 23   No results found for this basename: AMMONIA,  in the last 168 hours CBC:  Recent Labs Lab 05/27/13 2345 05/28/13 0006 05/29/13 1615 05/29/13 2259 05/30/13 0347  WBC 12.3*  --  8.0 8.4 9.8  NEUTROABS  --   --  5.8 5.9  --   HGB 11.7* 11.9* 11.4* 11.5* 11.3*  HCT 35.0* 35.0* 34.9* 35.2* 34.5*  MCV 87.3  --  88.1 87.1 87.1  PLT 235  --  235 246 236   Cardiac Enzymes: No results found for this basename: CKTOTAL, CKMB, CKMBINDEX, TROPONINI,  in the last 168 hours BNP (last 3 results) No results found for this basename: PROBNP,  in the last 8760 hours CBG:  Recent Labs Lab 05/29/13 2221 05/30/13 0751 05/30/13 1200  GLUCAP 111* 105* 113*    No results found for this or any previous visit (from the past 240 hour(s)).  Studies: No results found.  Scheduled Meds: . amLODipine  10 mg Oral QPM  . aspirin  81 mg Oral Daily  . atorvastatin  20 mg Oral Daily  . cyclobenzaprine  10 mg Oral QHS  . insulin aspart  0-15 Units Subcutaneous TID WC  . insulin aspart  0-5 Units Subcutaneous QHS  . lisinopril  40 mg Oral Q24H  . metoprolol succinate  50 mg Oral Daily  . mometasone-formoterol  2 puff Inhalation BID  . multivitamin with minerals  1 tablet Oral Daily  . piperacillin-tazobactam (ZOSYN)  IV  3.375 g Intravenous Q8H   Continuous Infusions: . sodium chloride 125 mL/hr at 05/30/13 1231    Principal Problem:   Acute diverticulitis Active Problems:   HTN (hypertension)   Abdominal pain, left lower quadrant   Nausea and vomiting   Leukocytosis   Acute renal failure   Anemia   Diabetes   Dyslipidemia    Time spent: 60 min    [facility]  [facility] Pager [phone]  If 7PM-7AM, please contact night-coverage at [email or web address],  password [employer] [employer] [facility] 05/30/2013, 1:56 PM  LOS: 1 day

## 2013-05-30 NOTE — Care Management Note (Signed)
CARE MANAGEMENT NOTE 05/30/2013  Patient:  Angela [name], Angela [name]   Account Number:  [id number]  Date Initiated:  05/30/2013  Documentation initiated by:  Jaylnn Ullery  Subjective/Objective Assessment:   67 yo female admitted with diverticulitis. PTA pt from home. PCP: Leanor Rubenstein, MD     Action/Plan:   Home when stable   Anticipated DC Date:     Anticipated DC Plan:  HOME/SELF CARE      DC Planning Services  CM consult      Choice offered to / List presented to:  NA   DME arranged  NA      DME agency  NA     HH arranged  NA      HH agency  NA   Status of service:  In process, will continue to follow Medicare Important Message given?   (If response is "NO", the following Medicare IM given date fields will be blank) Date Medicare IM given:   Date Additional Medicare IM given:    Discharge Disposition:    Per UR Regulation:  Reviewed for med. necessity/level of care/duration of stay  If discussed at Long Length of Stay Meetings, dates discussed:    Comments:  05/30/13 1118 Lovene Maret,RN,BSN [phone] Chart reviewed for utilization of services. No needs identified at this time.

## 2013-05-30 NOTE — ED Provider Notes (Signed)
History    CSN: [id number] Arrival date & time 05/29/13  1535  First MD Initiated Contact with Patient 05/29/13 1539     Chief Complaint  Patient presents with  . Emesis   (Consider location/radiation/quality/duration/timing/severity/associated sxs/prior Treatment) HPI Patient presents to the emergency department with continued left lower quadrant abdominal pain.  Patient, states she was seen here 2 days, ago for diverticulitis.  Patient, states she had 14 episodes of vomiting today with continued nausea and pain.  The patient, states, that she's not had any fever, diarrhea, chest pain, shortness of breath, back pain, weakness, numbness, dizziness, headache, blurred vision, rash or dysuria.  Patient, states, that the Zofran did not help.  Patient, states, that nothing seems to make her condition, better.  Patient, states, that palpation makes her pain, worse in her abdomen. Past Medical History  Diagnosis Date  . Diabetes mellitus   . Hypertension   . Seizures   . Depression     with anxiety  . Chronic kidney disease     chronic  . DDD (degenerative disc disease)   . Fibromyalgia   . COPD (chronic obstructive pulmonary disease)   . Sleep apnea     wears CPAP   Past Surgical History  Procedure Laterality Date  . Appendectomy    . Tonsillectomy    . Gastric fundoplication    . Back surgery    . Hernia repair     No family history on file. History  Substance Use Topics  . Smoking status: Current Every Day Smoker  . Smokeless tobacco: Never Used  . Alcohol Use: No   OB History   Grav Para Term Preterm Abortions TAB SAB Ect Mult Living                 Review of Systems All other systems negative except as documented in the HPI. All pertinent positives and negatives as reviewed in the HPI. Allergies  Aspirin; Codeine; Iohexol; and Ciprofloxacin  Home Medications  No current outpatient prescriptions on file. BP 145/58  Pulse 64  Temp(Src) 98.3 F (36.8 C) (Oral)   Resp 18  Ht 5' 7.5" (1.715 m)  Wt 207 lb 10.8 oz (94.2 kg)  BMI 32.03 kg/m2  SpO2 97% Physical Exam  Nursing note and vitals reviewed. Constitutional: She is oriented to person, place, and time. She appears well-developed and well-nourished.  HENT:  Head: Normocephalic and atraumatic.  Mouth/Throat: Oropharynx is clear and moist.  Eyes: Pupils are equal, round, and reactive to light.  Neck: Normal range of motion. Neck supple.  Cardiovascular: Normal rate, regular rhythm and normal heart sounds.  Exam reveals no gallop and no friction rub.   No murmur heard. Pulmonary/Chest: Effort normal and breath sounds normal. No respiratory distress.  Abdominal: Soft. Bowel sounds are normal. She exhibits no distension. There is tenderness. There is guarding. There is no rigidity and no rebound.    Neurological: She is alert and oriented to person, place, and time. She exhibits normal muscle tone. Coordination normal.  Skin: Skin is warm and dry. No rash noted.    ED Course  Procedures (including critical care time) Labs Reviewed  CBC WITH DIFFERENTIAL - Abnormal; Notable for the following:    Hemoglobin 11.4 (*)    HCT 34.9 (*)    All other components within normal limits  URINALYSIS, ROUTINE W REFLEX MICROSCOPIC - Abnormal; Notable for the following:    APPearance CLOUDY (*)    All other components within normal limits  COMPREHENSIVE METABOLIC PANEL - Abnormal; Notable for the following:    Glucose, Bld 136 (*)    Alkaline Phosphatase 127 (*)    GFR calc non Af Amer 56 (*)    GFR calc Af Amer 65 (*)    All other components within normal limits  COMPREHENSIVE METABOLIC PANEL - Abnormal; Notable for the following:    Glucose, Bld 115 (*)    Albumin 3.4 (*)    Alkaline Phosphatase 124 (*)    GFR calc non Af Amer 62 (*)    GFR calc Af Amer 72 (*)    All other components within normal limits  CBC WITH DIFFERENTIAL - Abnormal; Notable for the following:    Hemoglobin 11.5 (*)    HCT  35.2 (*)    All other components within normal limits  GLUCOSE, CAPILLARY - Abnormal; Notable for the following:    Glucose-Capillary 111 (*)    All other components within normal limits  MAGNESIUM  PHOSPHORUS  APTT  PROTIME-INR  HEMOGLOBIN A1C  TSH  COMPREHENSIVE METABOLIC PANEL  CBC   No results found. 1. Diverticulitis   2. Abdominal pain, left lower quadrant   3. Acute diverticulitis   4. Acute renal failure   5. Nausea and vomiting    Patient be admitted to the hospital for diverticulitis with vomiting.  Patient is advised of the need for admission all questions were answered MDM    Carlyle Dolly, PA-C 05/30/13 0120

## 2013-05-31 ENCOUNTER — Encounter: Payer: Self-pay | Admitting: *Deleted

## 2013-05-31 MED ORDER — BIOTENE DRY MOUTH MT LIQD
15.0000 mL | Freq: Two times a day (BID) | OROMUCOSAL | Status: DC
  Administered 2013-05-31: 15 mL via OROMUCOSAL

## 2013-05-31 MED ORDER — AMOXICILLIN-POT CLAVULANATE 875-125 MG PO TABS: 1.0000 | ORAL_TABLET | Freq: Two times a day (BID) | ORAL | Status: AC

## 2013-05-31 NOTE — Progress Notes (Signed)
[facility] Progress Note  Subjective:  Feeling better.  Tolerating clears.  Not taking any pain medication.  Objective:  Vital signs in last 24 hours: Temp:  [98 F (36.7 C)-98.3 F (36.8 C)] 98.3 F (36.8 C) (07/18 0600) Pulse Rate:  [59-64] 64 (07/18 0600) Resp:  [16-20] 16 (07/18 0600) BP: (142-153)/(57-75) 153/75 mmHg (07/18 0600) SpO2:  [96 %-100 %] 96 % (07/18 0743) Weight:  [207 lb 10.8 oz (94.2 kg)] 207 lb 10.8 oz (94.2 kg) (07/18 0600) Last BM Date: 05/29/13 General:   Alert, Well-developed, in NAD Heart:  Regular rate and rhythm; no murmurs Pulm:  CTAB.  No W/R/R. Abdomen:  Soft, non-distended. BS present.  Mild LLQ TTP without R/R/G.   Extremities:  Without edema. Neurologic:  Alert and  oriented x4;  grossly normal neurologically. Psych:  Alert and cooperative. Normal mood and affect.  Intake/Output from previous day: 07/17 0701 - 07/18 0700 In: 2220 [P.O.:2220] Out: 7600 [Urine:7600]  Lab Results:  Recent Labs  05/29/13 1615 05/29/13 2259 05/30/13 0347  WBC 8.0 8.4 9.8  HGB 11.4* 11.5* 11.3*  HCT 34.9* 35.2* 34.5*  PLT 235 246 236   BMET  Recent Labs  05/29/13 1615 05/29/13 2259 05/30/13 0347  NA 137 141 139  K 4.6 4.5 4.6  CL 101 105 104  CO2 26 26 26   GLUCOSE 136* 115* 124*  BUN 13 12 11   CREATININE 1.03 0.94 0.99  CALCIUM 9.8 9.7 9.5   LFT  Recent Labs  05/30/13 0347  PROT 6.6  ALBUMIN 3.4*  AST 15  ALT 21  ALKPHOS 118*  BILITOT 0.3   PT/INR  Recent Labs  05/29/13 2259  LABPROT 12.8  INR 0.98   Assessment / Plan: -Acute diverticulitis: Improving.  Will need to complete 14 days of total antibiotics (recommend augmenting at D/C, which she already has at home).  Will advance diet to full liquids this AM and can be advanced further as tolerated.  Follow-up visit in our office is scheduled and in discharge instructions (will schedule colonoscopy at that visit).  Ok for D/C today.    LOS: 2 days   ZEHR, JESSICA  D.  05/31/2013, 8:58 AM  Pager number [phone]  I have personally taken an interval history, reviewed the chart, and examined the patient.  I agree with the extender's note, impression and recommendations.  Barbette Hair. Arlyce Dice, MD, [facility] [phone]

## 2013-05-31 NOTE — Discharge Summary (Signed)
Physician Discharge Summary  Angela [name] ZOX:[id number] DOB: Nov 04, 1946 DOA: 05/29/2013  PCP: Leanor Rubenstein, MD  Admit date: 05/29/2013 Discharge date: 05/31/2013  Time spent: 50 minutes  Recommendations for Outpatient Follow-up:  1. Follow up GI in one month  Discharge Diagnoses:  Principal Problem:   Acute diverticulitis Active Problems:   HTN (hypertension)   Abdominal pain, left lower quadrant   Nausea and vomiting   Leukocytosis   Acute renal failure   Anemia   Diabetes   Dyslipidemia   Discharge Condition: Stable  Diet recommendation: Low residue diet  Filed Weights   05/29/13 2207 05/30/13 0530 05/31/13 0600  Weight: 94.2 kg (207 lb 10.8 oz) 94.2 kg (207 lb 10.8 oz) 94.2 kg (207 lb 10.8 oz)    History of present illness:  67 year old female with past medical history of hypertension, diabetes and dyslipidemia, seen in ED 2 days ago for left lower abdominal pain and found to have acute diverticulitits based on CT abdomen. She was discharged on ciipro and flagyl but ultimately came back as she continued to have nausea and vomiting as well as progressively worsening left lower abdominal pain. She described her pain as sharp and constant and non radiating, 10/10 in intensity. No diarrhea. No fever or chills. No chest pain, no palpitations. She tried zofran at home but could not keep anything down.  In ED, her vitals remain stable. CBC revealed mild anemia with hemoglobin of 11.4. BMP was unremarkable. CT abdomen was done 05/27/2013 with findings of acute diverticulitis. No other imaging studies obtained at the time of admission.      Hospital Course:  Acute  Diverticulitis- patient was started on IV Zosyn and Flagyl. GI was consulted ,  At this time patient has improved and will be discharged on Augmentin twice a day for 2 weeks . She'll follow up with the GI in one month for colonoscopy as outpatient . Diabetes mellitus - will continue with metformin   Hypertension-continue  metoprolol, lisinopril    Procedures:  None  Consultations:   GI  Discharge Exam: Filed Vitals:   05/30/13 1400 05/30/13 2100 05/31/13 0600 05/31/13 0743  BP: 153/73 142/57 153/75   Pulse: 59 59 64   Temp: 98 F (36.7 C) 98.2 F (36.8 C) 98.3 F (36.8 C)   TempSrc: Oral Oral Oral   Resp: 20 20 16    Height:      Weight:   94.2 kg (207 lb 10.8 oz)   SpO2: 98% 97% 100% 96%    General: appear in no acute distress Cardiovascular: S1-S2 regular Respiratory: clear bilaterally extremities: No edema Abdomen: Soft nontender no rigidity no guarding  Discharge Instructions  Discharge Orders   Future Appointments Provider Department Dept Phone   06/03/2013 9:20 AM Adolph Pollack, MD [facility], [place] [phone]   07/04/2013 10:15 AM Hart Carwin, MD [facility] [phone]   Future Orders Complete By Expires     Diet general  As directed     Scheduling Instructions:      Low residue diet    Increase activity slowly  As directed         Medication List         amLODipine 10 MG tablet  Commonly known as:  NORVASC  Take 10 mg by mouth every evening.     amoxicillin-clavulanate 875-125 MG per tablet  Commonly known as:  AUGMENTIN  Take 1 tablet by mouth 2 (two) times daily.  aspirin 81 MG chewable tablet  Chew 1 tablet (81 mg total) by mouth daily.     atorvastatin 20 MG tablet  Commonly known as:  LIPITOR  Take 20 mg by mouth daily.     cyclobenzaprine 10 MG tablet  Commonly known as:  FLEXERIL  Take 10 mg by mouth at bedtime. For muscle spasm     Fluticasone-Salmeterol 100-50 MCG/DOSE Aepb  Commonly known as:  ADVAIR  Inhale 1 puff into the lungs every 12 (twelve) hours.     lisinopril 40 MG tablet  Commonly known as:  PRINIVIL,ZESTRIL  Take 1 tablet (40 mg total) by mouth daily.     metFORMIN 500 MG tablet  Commonly known as:  GLUCOPHAGE  Take 500 mg by mouth 2 (two) times daily with a meal.     metoprolol  succinate 50 MG 24 hr tablet  Commonly known as:  TOPROL-XL  Take 50 mg by mouth daily. Take with or immediately following a meal.     multivitamin with minerals Tabs  Take 1 tablet by mouth daily.     ondansetron 8 MG disintegrating tablet  Commonly known as:  ZOFRAN ODT  Take 1 tablet (8 mg total) by mouth every 8 (eight) hours as needed for nausea.     oxyCODONE-acetaminophen 5-325 MG per tablet  Commonly known as:  PERCOCET/ROXICET  Take 2 tablets by mouth every 4 (four) hours as needed for pain.     traMADol 50 MG tablet  Commonly known as:  ULTRAM  Take 50 mg by mouth every 6 (six) hours as needed for pain.       Allergies  Allergen Reactions  . Aspirin Other (See Comments)    Turns tongue black  . Codeine Nausea And Vomiting  . Iohexol      Desc: Pt states several years ago during a CT scan w/ iv cotnrast she had severe nausea and vomiting, sob w/ difficulty breathing and swallowing.  She had full 13hr. premeds today (5/14/7) and did fine w/o complications.   . Ciprofloxacin Rash       Follow-up Information   Follow up with Lina Sar, MD On 07/04/2013. (10:15 pm)    Contact information:   [address] [place] [address] [phone]        The results of significant diagnostics from this hospitalization (including imaging, microbiology, ancillary and laboratory) are listed below for reference.    Significant Diagnostic Studies: Ct Abdomen Pelvis Wo Contrast  05/28/2013   *RADIOLOGY REPORT*  Clinical Data: Right-sided inguinal hernia pain.  CT ABDOMEN AND PELVIS WITHOUT CONTRAST  Technique:  Multidetector CT imaging of the abdomen and pelvis was performed following the standard protocol without intravenous contrast.  Comparison: CT of the abdomen and pelvis performed 03/27/2006  Findings: Minimal bibasilar atelectasis is noted.  Postoperative change is noted about the gastroesophageal junction, reflecting prior fundoplication.  The liver and spleen are  unremarkable in appearance.  The gallbladder is within normal limits.  The pancreas and adrenal glands are unremarkable.  Nonspecific perinephric stranding is noted bilaterally.  There is mild right-sided pelvicaliectasis, without significant hydronephrosis.  The kidneys are otherwise unremarkable in appearance.  No renal or ureteral stones are identified.  No free fluid is identified.  The small bowel is unremarkable in appearance.  The stomach is within normal limits.  No acute vascular abnormalities are seen.  Mild scattered calcification is noted along the abdominal aorta and its branches.  The patient is status post appendectomy.  Focal soft tissue inflammation is noted along the distal descending colon, with trace associated free fluid and mild colonic wall thickening.  Associated inflamed diverticula are seen, with acute diverticulitis.  There is no evidence of perforation or abscess formation at this time.  There is diffuse diverticulosis along the proximal sigmoid colon, and more mild diverticulosis along the descending colon.  No significant inguinal hernia is seen.  There has been interval repair of the previously noted small anterior midline abdominal wall hernia.  The bladder is moderately distended and grossly unremarkable in appearance.  The patient is status post hysterectomy.  No suspicious adnexal masses are seen.  No inguinal lymphadenopathy is seen.  No acute osseous abnormalities are identified.  There is disc space narrowing and vacuum phenomenon at L3-L4 and L4-L5.  IMPRESSION:  1.  Acute diverticulitis at the distal descending colon, with trace free fluid, focal soft tissue inflammation and mild colonic wall thickening.  No evidence of perforation or abscess formation at this time. 2.  Diffuse diverticulosis along the proximal sigmoid colon, with more mild diverticulosis along the descending colon. 3.  No significant inguinal or anterior abdominal wall hernia seen at this time.   Original  Report Authenticated By: Tonia Ghent, M.D.    Microbiology: No results found for this or any previous visit (from the past 240 hour(s)).   Labs: Basic Metabolic Panel:  Recent Labs Lab 05/27/13 2345 05/28/13 0006 05/29/13 1615 05/29/13 2259 05/30/13 0347  NA 138 139 137 141 139  K 4.4 4.4 4.6 4.5 4.6  CL 102 105 101 105 104  CO2 24  --  26 26 26   GLUCOSE 88 91 136* 115* 124*  BUN 18 19 13 12 11   CREATININE 1.18* 1.20* 1.03 0.94 0.99  CALCIUM 9.7  --  9.8 9.7 9.5  MG  --   --   --  2.0  --   PHOS  --   --   --  3.3  --    Liver Function Tests:  Recent Labs Lab 05/27/13 2345 05/29/13 1615 05/29/13 2259 05/30/13 0347  AST 21 18 17 15   ALT 24 24 21 21   ALKPHOS 125* 127* 124* 118*  BILITOT 0.3 0.3 0.3 0.3  PROT 7.3 7.2 7.0 6.6  ALBUMIN 3.9 3.7 3.4* 3.4*    Recent Labs Lab 05/27/13 2345  LIPASE 23   No results found for this basename: AMMONIA,  in the last 168 hours CBC:  Recent Labs Lab 05/27/13 2345 05/28/13 0006 05/29/13 1615 05/29/13 2259 05/30/13 0347  WBC 12.3*  --  8.0 8.4 9.8  NEUTROABS  --   --  5.8 5.9  --   HGB 11.7* 11.9* 11.4* 11.5* 11.3*  HCT 35.0* 35.0* 34.9* 35.2* 34.5*  MCV 87.3  --  88.1 87.1 87.1  PLT 235  --  235 246 236   Cardiac Enzymes: No results found for this basename: CKTOTAL, CKMB, CKMBINDEX, TROPONINI,  in the last 168 hours BNP: BNP (last 3 results) No results found for this basename: PROBNP,  in the last 8760 hours CBG:  Recent Labs Lab 05/30/13 0751 05/30/13 1200 05/30/13 1701 05/30/13 2059 05/31/13 0725  GLUCAP 105* 113* 89 106* 95       Signed:  Velia Pamer S  [facility] 05/31/2013, 10:07 AM

## 2013-05-31 NOTE — Progress Notes (Signed)
Pt D/C home, alert and oriented with no new complains. D/C instructions done, pt verbalizes understanding

## 2013-05-31 NOTE — Progress Notes (Signed)
Pain much improved.  Looks and feels well. OK to d/c home - recommend augmentin. She's due for f/u colonoscopy but would wait at least 4 weeks.

## 2013-06-03 ENCOUNTER — Encounter (INDEPENDENT_AMBULATORY_CARE_PROVIDER_SITE_OTHER): Payer: Self-pay | Admitting: General Surgery

## 2013-06-03 ENCOUNTER — Ambulatory Visit (INDEPENDENT_AMBULATORY_CARE_PROVIDER_SITE_OTHER): Payer: Medicare Other | Admitting: General Surgery

## 2013-06-03 ENCOUNTER — Telehealth (INDEPENDENT_AMBULATORY_CARE_PROVIDER_SITE_OTHER): Payer: Self-pay | Admitting: General Surgery

## 2013-06-03 ENCOUNTER — Other Ambulatory Visit (INDEPENDENT_AMBULATORY_CARE_PROVIDER_SITE_OTHER): Payer: Self-pay | Admitting: General Surgery

## 2013-06-03 VITALS — BP 130/68 | HR 74 | Resp 16 | Ht 67.5 in | Wt 209.2 lb

## 2013-06-03 DIAGNOSIS — R1013 Epigastric pain: Secondary | ICD-10-CM

## 2013-06-03 DIAGNOSIS — R1011 Right upper quadrant pain: Secondary | ICD-10-CM

## 2013-06-03 DIAGNOSIS — R140 Abdominal distension (gaseous): Secondary | ICD-10-CM

## 2013-06-03 NOTE — Telephone Encounter (Signed)
Spoke with pt and informed her that she has an [place] appt at GI on 06/07/13 at 7:45.  Explained that it is NPO after midnight

## 2013-06-03 NOTE — Patient Instructions (Addendum)
Continue low fiber diet.  Avoid activities that lead to pain and previous hernia repair site.  We will call you with the results of your ultrasound of the gallbladder.

## 2013-06-03 NOTE — Progress Notes (Signed)
Patient ID: Angela [name], female   DOB: 07-30-1946, 67 y.o.   MRN: [id number]  Chief Complaint  Patient presents with  . New Evaluation    eval hernia repair site    HPI Angela [name] is a 67 y.o. female.   HPI  She is referred here by Dr. Wynelle Link because of pain around her previous abdominal hernia repair site. She underwent repair of a ventral incisional hernia 05/11/2006. A year ago, she had pneumonia and coughed a lot. Since that time, when she bends over to tie her shoes or gets up from a sitting position she develops some cramping pain around the hernia repair incision site. She also notes a lot of bloating after eating and some cramping right upper quadrant pain. She was recently hospitalized for treatment of acute diverticulitis. CT scan at that time did not show any evidence of abdominal wall hernias or gallbladder abnormality. It did show diverticulitis. She was treated appropriately for that and is getting better.  Past Medical History  Diagnosis Date  . Diabetes mellitus   . Hypertension   . Seizures   . Depression     with anxiety  . Chronic kidney disease     chronic  . DDD (degenerative disc disease)   . Fibromyalgia   . COPD (chronic obstructive pulmonary disease)   . Sleep apnea     wears CPAP  . Diverticulitis   . Diverticulosis   . Esophageal dysmotility 03/01/06    mild  . Anemia 2014    mild    Past Surgical History  Procedure Laterality Date  . Appendectomy    . Tonsillectomy    . Gastric fundoplication    . Back surgery    . Hernia repair      History reviewed. No pertinent family history.  Social History History  Substance Use Topics  . Smoking status: Current Every Day Smoker  . Smokeless tobacco: Never Used  . Alcohol Use: No    Allergies  Allergen Reactions  . Aspirin Other (See Comments)    Turns tongue black  . Codeine Nausea And Vomiting  . Iohexol      Desc: Pt states several years ago during a CT scan w/ iv cotnrast she had severe  nausea and vomiting, sob w/ difficulty breathing and swallowing.  She had full 13hr. premeds today (5/14/7) and did fine w/o complications.   . Ciprofloxacin Rash    Current Outpatient Prescriptions  Medication Sig Dispense Refill  . amLODipine (NORVASC) 10 MG tablet Take 10 mg by mouth every evening.       Marland Kitchen AMLODIPINE BESYLATE PO       . amoxicillin-clavulanate (AUGMENTIN) 875-125 MG per tablet Take 1 tablet by mouth 2 (two) times daily.  28 tablet  0  . aspirin 81 MG chewable tablet Chew 1 tablet (81 mg total) by mouth daily.      Marland Kitchen atorvastatin (LIPITOR) 20 MG tablet Take 20 mg by mouth daily.      . cyclobenzaprine (FLEXERIL) 10 MG tablet Take 10 mg by mouth at bedtime. For muscle spasm      . Fluticasone-Salmeterol (ADVAIR) 100-50 MCG/DOSE AEPB Inhale 1 puff into the lungs every 12 (twelve) hours.      Marland Kitchen lisinopril (PRINIVIL,ZESTRIL) 40 MG tablet Take 1 tablet (40 mg total) by mouth daily.  30 tablet  0  . metFORMIN (GLUCOPHAGE) 500 MG tablet Take 500 mg by mouth 2 (two) times daily with a meal.      .  metoprolol succinate (TOPROL-XL) 50 MG 24 hr tablet Take 50 mg by mouth daily. Take with or immediately following a meal.      . Multiple Vitamin (MULTIVITAMIN WITH MINERALS) TABS Take 1 tablet by mouth daily.      . traMADol (ULTRAM) 50 MG tablet Take 50 mg by mouth every 6 (six) hours as needed for pain.      Marland Kitchen ondansetron (ZOFRAN ODT) 8 MG disintegrating tablet Take 1 tablet (8 mg total) by mouth every 8 (eight) hours as needed for nausea.  20 tablet  0  . oxyCODONE-acetaminophen (PERCOCET/ROXICET) 5-325 MG per tablet Take 2 tablets by mouth every 4 (four) hours as needed for pain.  15 tablet  0   No current facility-administered medications for this visit.    Review of Systems Review of Systems  HENT: Positive for congestion.   Respiratory: Positive for cough.   Cardiovascular: Positive for leg swelling.  Gastrointestinal: Positive for abdominal distention.  Musculoskeletal:  Positive for arthralgias.  Neurological: Positive for headaches.    Blood pressure 130/68, pulse 74, resp. rate 16, height 5' 7.5" (1.715 m), weight 209 lb 3.2 oz (94.892 kg).  Physical Exam Physical Exam  Constitutional: No distress.  Overweight female.  HENT:  Head: Normocephalic and atraumatic.  Eyes: EOM are normal. No scleral icterus.  Abdominal: Soft. She exhibits no distension and no mass. There is no tenderness.  Multiple small upper abdominal scars. Epigastric scar at previous hernia repair site which is solid without evidence of hernia.  Skin: Skin is warm and dry.  Psychiatric: She has a normal mood and affect. Her behavior is normal.    Data Reviewed Old operative note. Recent hospitalization notes and CT scan.  Assessment    Epigastric abdominal pain with certain movements which sounds muscular. However she does have some bloating and cramping right upper quadrant pain which sounds biliary colic in nature. She is recovering from diverticulitis. No recurrent hernia detected at this time the     Plan    We'll check an abdominal ultrasound to make sure she doesn't have gallstones. If this is negative we'll check a hepatobiliary scan with CCK injection to make sure she doesn't have biliary dyskinesia. I have instructed her to avoid activities that are causing her discomfort at this time. We'll discuss the results of her studies with her when we get them back.        Victor Langenbach J 06/03/2013, 9:46 AM

## 2013-06-04 NOTE — ED Provider Notes (Signed)
Medical screening examination/treatment/procedure(s) were conducted as a shared visit with non-physician practitioner(s) and myself.  I personally evaluated the patient during the encounter.  Pt with continued pain and vomiting despite being on abx for 2 days for diverticulitis. L abdomen tender, but not peritoneal. Consider complication such as perf, but suspicion low. Repeat imaging deferred. Will admit.   Raeford Razor, MD 06/04/13 [phone]

## 2013-06-07 ENCOUNTER — Ambulatory Visit
Admission: RE | Admit: 2013-06-07 | Discharge: 2013-06-07 | Disposition: A | Discharge status: Home / Self Care | Payer: Medicare Other | Source: Ambulatory Visit | Attending: General Surgery | Admitting: General Surgery

## 2013-06-07 DIAGNOSIS — R140 Abdominal distension (gaseous): Secondary | ICD-10-CM

## 2013-06-07 DIAGNOSIS — R1011 Right upper quadrant pain: Secondary | ICD-10-CM

## 2013-07-04 ENCOUNTER — Encounter: Payer: Self-pay | Admitting: Internal Medicine

## 2013-07-04 ENCOUNTER — Ambulatory Visit (INDEPENDENT_AMBULATORY_CARE_PROVIDER_SITE_OTHER): Payer: PRIVATE HEALTH INSURANCE | Admitting: Internal Medicine

## 2013-07-04 VITALS — BP 102/60 | HR 72 | Ht 67.5 in | Wt 211.5 lb

## 2013-07-04 DIAGNOSIS — K219 Gastro-esophageal reflux disease without esophagitis: Secondary | ICD-10-CM

## 2013-07-04 DIAGNOSIS — K5732 Diverticulitis of large intestine without perforation or abscess without bleeding: Secondary | ICD-10-CM

## 2013-07-04 MED ORDER — MOVIPREP 100 G PO SOLR: 1.0000 | Freq: Once | ORAL | Status: DC

## 2013-07-04 NOTE — Patient Instructions (Addendum)
You have been scheduled for an endoscopy and colonoscopy with propofol. Please follow the written instructions given to you at your visit today. Please pick up your prep at the pharmacy within the next 1-3 days. If you use inhalers (even only as needed), please bring them with you on the day of your procedure. Your physician has requested that you go to www.startemmi.com and enter the access code given to you at your visit today. This web site gives a general overview about your procedure. However, you should still follow specific instructions given to you by our office regarding your preparation for the procedure.   CC: Dr Marcy Siren

## 2013-07-04 NOTE — Progress Notes (Signed)
Angela [name] 06-17-1946 MRN [id number]  History of Present Illness:  This is a 67 year old white female who is post hospitalization for diverticulitis between 05/29/13 and 05/31/2013. A CT scan of the abdomen at that time  showed inflammatory changes in the left colon. She responded to IV antibiotics and was discharged on Cipro and Flagyl. She completed this about a week ago. She feels very well and denies abdominal pain, constipation or diarrhea. She had a remote repair of ventral hernia.by Dr Abbey Chatters. Her last colonoscopy in June 2001 for rectal bleeding showed mild diverticulosis. She is a diabetic. She has high blood pressure and hyperlipidemia. There is a family history of colon cancer in her maternal aunt.   Past Medical History  Diagnosis Date  . Diabetes mellitus   . Hypertension   . Seizures   . Depression     with anxiety  . Chronic kidney disease     chronic  . DDD (degenerative disc disease)   . Fibromyalgia   . COPD (chronic obstructive pulmonary disease)   . Sleep apnea     wears CPAP  . Diverticulitis   . Diverticulosis   . Esophageal dysmotility 03/01/06    mild  . Anemia 2014    mild   Past Surgical History  Procedure Laterality Date  . Appendectomy    . Tonsillectomy    . Gastric fundoplication    . Back surgery    . Hernia repair      reports that she has been smoking.  She has never used smokeless tobacco. She reports that she does not drink alcohol or use illicit drugs. family history includes COPD in her father; Colon cancer in her maternal aunt; Stomach cancer in her maternal grandfather. Allergies  Allergen Reactions  . Aspirin Other (See Comments)    Turns tongue black  . Codeine Nausea And Vomiting  . Iohexol      Desc: Pt states several years ago during a CT scan w/ iv cotnrast she had severe nausea and vomiting, sob w/ difficulty breathing and swallowing.  She had full 13hr. premeds today (5/14/7) and did fine w/o complications.   .  Ciprofloxacin Rash        Review of Systems: Denies dysphagia heartburn rectal bleeding  The remainder of the 10 point ROS is negative except as outlined in H&P   Physical Exam: General appearance  Well developed, in no distress. Overweight Eyes- non icteric. HEENT nontraumatic, normocephalic. Mouth no lesions, tongue papillated, no cheilosis. Neck supple without adenopathy, thyroid not enlarged, no carotid bruits, no JVD. Lungs Clear to auscultation bilaterally. Cor normal S1, normal S2, regular rhythm, no murmur,  quiet precordium. Abdomen: Obese soft abdomen with a well-healed surgical scar above the umbilicus. Normal active bowel sounds. Minimal tenderness in left lower quadrants on deep pressure. Rectal: Not done. Extremities no pedal edema. Skin no lesions. Neurological alert and oriented x 3. Psychological normal mood and affect.  Assessment and Plan:  Problem #70 67 year old white female with a family history of colon cancer who was hospitalized for diverticulitis.6 weeks ago,  She is due for a recall colonoscopy. She is back to the baseline as far as her diverticular disease is concerned. She is back on a high-fiber diet. We will schedule her for screening colonoscopy.  Problem #2 History of gastric cancer in patient's grandfather. Patient has a history of reflux and dyspepsia and is interested in an upper endoscopy. We will do that at the same time as her colonoscopy.  07/04/2013 Lina Sar

## 2013-07-18 ENCOUNTER — Encounter (INDEPENDENT_AMBULATORY_CARE_PROVIDER_SITE_OTHER): Payer: Self-pay

## 2013-07-18 NOTE — Progress Notes (Unsigned)
Patient ID: Angela [name], female   DOB: [birth year], 67 y.o.   MRN: [id number] Pt will call at a later date to reschedule her appointment.  Cancelled her 05/20/13 consult.  Records have been scanned into Epic.

## 2013-07-29 ENCOUNTER — Telehealth: Payer: Self-pay | Admitting: Internal Medicine

## 2013-07-29 NOTE — Telephone Encounter (Signed)
Procedures scheduled are on 07/31/13.

## 2013-07-29 NOTE — Telephone Encounter (Signed)
Please take Prilosec OTC 20 mg, 1 po qd till she sees me for the procedures.

## 2013-07-29 NOTE — Telephone Encounter (Signed)
Patient wanted to let Dr. Juanda Chance know that on Saturday at 1 AM, she woke up with the bad abdominal pain again. She took Tylenol and it did get better. She still has some tenderness in abdomen if she leans against something.

## 2013-07-29 NOTE — Telephone Encounter (Signed)
When are her procedures?, I think she should take OTC Prilosec 20 mg till her procedures.

## 2013-07-29 NOTE — Telephone Encounter (Signed)
Patient notified of Dr. Brodie's recommendation 

## 2013-07-31 ENCOUNTER — Ambulatory Visit (AMBULATORY_SURGERY_CENTER): Payer: PRIVATE HEALTH INSURANCE | Admitting: Internal Medicine

## 2013-07-31 ENCOUNTER — Encounter: Payer: Self-pay | Admitting: Internal Medicine

## 2013-07-31 VITALS — BP 126/72 | HR 59 | Temp 96.2°F | Resp 17 | Ht 67.0 in | Wt 211.0 lb

## 2013-07-31 DIAGNOSIS — B3781 Candidal esophagitis: Secondary | ICD-10-CM

## 2013-07-31 DIAGNOSIS — K219 Gastro-esophageal reflux disease without esophagitis: Secondary | ICD-10-CM

## 2013-07-31 DIAGNOSIS — K5732 Diverticulitis of large intestine without perforation or abscess without bleeding: Secondary | ICD-10-CM

## 2013-07-31 DIAGNOSIS — K209 Esophagitis, unspecified: Secondary | ICD-10-CM

## 2013-07-31 DIAGNOSIS — R1013 Epigastric pain: Secondary | ICD-10-CM

## 2013-07-31 DIAGNOSIS — D131 Benign neoplasm of stomach: Secondary | ICD-10-CM

## 2013-07-31 DIAGNOSIS — Z1211 Encounter for screening for malignant neoplasm of colon: Secondary | ICD-10-CM

## 2013-07-31 LAB — GLUCOSE, CAPILLARY: Glucose-Capillary: 99 mg/dL (ref 70–99)

## 2013-07-31 MED ORDER — FLUCONAZOLE 100 MG PO TABS: 100.0000 mg | ORAL_TABLET | Freq: Every day | ORAL | Status: AC

## 2013-07-31 MED ORDER — SODIUM CHLORIDE 0.9 % IV SOLN: 500.0000 mL | INTRAVENOUS | Status: DC

## 2013-07-31 NOTE — Progress Notes (Signed)
  [facility] Anesthesia Post-op Note  Patient: Angela [name]  Procedure(s) Performed: colonoscopy and endoscopy  Patient Location: [facility] - Recovery Area  Anesthesia Type: Deep Sedation/Propofol  Level of Consciousness: awake, oriented and patient cooperative  Airway and Oxygen Therapy: Patient Spontanous Breathing  Post-op Pain: none  Post-op Assessment:  Post-op Vital signs reviewed, Patient's Cardiovascular Status Stable, Respiratory Function Stable, Patent Airway, No signs of Nausea or vomiting and Pain level controlled  Post-op Vital Signs: Reviewed and stable  Complications: No apparent anesthesia complications  Euphemia Lingerfelt E 9:23 AM

## 2013-07-31 NOTE — Op Note (Signed)
[facility] [address] [place], [address]   ENDOSCOPY PROCEDURE REPORT  PATIENT: Jawana, [name]  MR#: [id number] BIRTHDATE: 1946/10/18 , 67  yrs. old GENDER: Female ENDOSCOPIST: Hart Carwin, MD REFERRED BY:  Deatra James, M.D. PROCEDURE DATE:  07/31/2013 PROCEDURE:  EGD w/ biopsy ASA CLASS:     Class II INDICATIONS:  Epigastric pain.   hx of Nissen Fundoplication,,. MEDICATIONS: MAC sedation, administered by CRNA and propofol (Diprivan) 150mg  IV TOPICAL ANESTHETIC: none  DESCRIPTION OF PROCEDURE: After the risks benefits and alternatives of the procedure were thoroughly explained, informed consent was obtained.  The LB [id number] endoscope was introduced through the mouth and advanced to the second portion of the duodenum. Without limitations.  The instrument was slowly withdrawn as the mucosa was fully examined.      [ Esophagus: Esophageal mucosa showed multiple specks of white exudate consistent with Candida esophagitis which extended from proximal to distal esophagus. Biopsies were taken to rule out Candida esophagitis. Gastroesophageal junction was normal. There was no stricture. Patient had a prior Nissen fundoplication  Stomach: Gastric mucosa was mildly erythematous and those small amount of bile in the gastric antrum. Biopsies were taken to rule out H. pyloric. Retroflexion of the endoscope revealing normal fundus and cardia. It confirmed intact Nissen fundoplication  Duodenum duodenal bulb and descending duodenum appeared normal The scope was then withdrawn from the patient and the procedure completed.  COMPLICATIONS: There were no complications. ENDOSCOPIC IMPRESSION: Candida esophagitis Remote Nissen fundoplication Mild antral gastritis, status post biopsies  RECOMMENDATIONS: Await biopsy results Diflucan 100 mg po qd x 5 days proceed with colonoscopy  REPEAT EXAM: no recall  eSigned:  Hart Carwin, MD 07/31/2013 9:26  AM   CC:  PATIENT NAME:  Angela, [name] MR#: [id number]

## 2013-07-31 NOTE — Progress Notes (Signed)
Called to room to assist during endoscopic procedure.  Patient ID and intended procedure confirmed with present staff. Received instructions for my participation in the procedure from the performing physician.  

## 2013-07-31 NOTE — Progress Notes (Signed)
Patient did not experience any of the following events: a burn prior to discharge; a fall within the facility; wrong site/side/patient/procedure/implant event; or a hospital transfer or hospital admission upon discharge from the facility. (G8907)Patient did not have preoperative order for IV antibiotic SSI prophylaxis. (G8918) ewm 

## 2013-07-31 NOTE — Op Note (Signed)
[facility] [address] [place], [address]   COLONOSCOPY PROCEDURE REPORT  PATIENT: Angela [name], Angela [name]  MR#: [id number] BIRTHDATE: 1945-12-09 , 67  yrs. old GENDER: Female ENDOSCOPIST: Hart Carwin, MD REFERRED [id number] Wynelle Link, M.D. PROCEDURE DATE:  07/31/2013 PROCEDURE:   Colonoscopy, screening First Screening Colonoscopy - Avg.  risk and is 50 yrs.  old or older - No.  Prior Negative Screening - Now for repeat screening. 10 or more years since last screening  History of Adenoma - Now for follow-up colonoscopy & has been > or = to 3 yrs.  N/A  Polyps Removed Today? No.  Recommend repeat exam, <10 yrs? No. ASA CLASS:   Class II INDICATIONS:last colonoscopy in 2001, recent episode of diverticulitis. MEDICATIONS: propofol (Diprivan) 150mg  IV  DESCRIPTION OF PROCEDURE:   After the risks benefits and alternatives of the procedure were thoroughly explained, informed consent was obtained.  A digital rectal exam revealed no abnormalities of the rectum.   The LB [id number]  endoscope was introduced through the anus and advanced to the cecum, which was identified by both the appendix and ileocecal valve. No adverse events experienced.   The quality of the prep was good, using MoviPrep  The instrument was then slowly withdrawn as the colon was fully examined.      COLON FINDINGS: There was moderate diverticulosis noted throughout the entire examined colon with associated angulation, tortuosity, muscular hypertrophy and colonic narrowing.  Retroflexed views revealed no abnormalities. The time to cecum=4 minutes 7 seconds. Withdrawal time=6 minutes 43 seconds.  The scope was withdrawn and the procedure completed. COMPLICATIONS: There were no complications.  ENDOSCOPIC IMPRESSION: There was moderate diverticulosis noted throughout the entire examined colon  RECOMMENDATIONS: High fiber diet Fiber supplements 1 tsp daily  eSigned:  Hart Carwin, MD  07/31/2013 9:31 AM   cc:   PATIENT NAME:  Angela [name], Angela [name] MR#: [id number]

## 2013-07-31 NOTE — Patient Instructions (Addendum)

## 2013-08-01 ENCOUNTER — Telehealth: Payer: Self-pay

## 2013-08-01 NOTE — Telephone Encounter (Signed)
  Follow up Call-  Call back number 07/31/2013  Post procedure Call Back phone  # [phone]  Permission to leave phone message Yes     Patient questions:  Do you have a fever, pain , or abdominal swelling? no Pain Score  0 *  Have you tolerated food without any problems? yes  Have you been able to return to your normal activities? yes  Do you have any questions about your discharge instructions: Diet   no Medications  no Follow up visit  no  Do you have questions or concerns about your Care? no  Actions: * If pain score is 4 or above: No action needed, pain <4.

## 2013-08-06 ENCOUNTER — Encounter: Payer: Self-pay | Admitting: Internal Medicine

## 2013-08-23 ENCOUNTER — Ambulatory Visit (INDEPENDENT_AMBULATORY_CARE_PROVIDER_SITE_OTHER): Payer: PRIVATE HEALTH INSURANCE | Admitting: Nurse Practitioner

## 2013-08-23 ENCOUNTER — Encounter: Payer: Self-pay | Admitting: Nurse Practitioner

## 2013-08-23 ENCOUNTER — Other Ambulatory Visit (INDEPENDENT_AMBULATORY_CARE_PROVIDER_SITE_OTHER): Payer: PRIVATE HEALTH INSURANCE

## 2013-08-23 ENCOUNTER — Telehealth: Payer: Self-pay | Admitting: Internal Medicine

## 2013-08-23 VITALS — BP 130/72 | HR 68 | Ht 67.0 in | Wt 212.8 lb

## 2013-08-23 DIAGNOSIS — R195 Other fecal abnormalities: Secondary | ICD-10-CM

## 2013-08-23 DIAGNOSIS — K648 Other hemorrhoids: Secondary | ICD-10-CM

## 2013-08-23 DIAGNOSIS — K625 Hemorrhage of anus and rectum: Secondary | ICD-10-CM

## 2013-08-23 DIAGNOSIS — R1011 Right upper quadrant pain: Secondary | ICD-10-CM

## 2013-08-23 LAB — BASIC METABOLIC PANEL
Chloride: 104 mEq/L (ref 96–112)
GFR: 52.39 mL/min — ABNORMAL LOW (ref >60.00)
Potassium: 5 mEq/L (ref 3.5–5.1)
Sodium: 140 mEq/L (ref 135–145)

## 2013-08-23 LAB — CBC WITH DIFFERENTIAL/PLATELET
Basophils Absolute: 0.1 10*3/uL (ref 0.0–0.1)
Basophils Relative: 0.8 % (ref 0.0–3.0)
Eosinophils Absolute: 0.2 10*3/uL (ref 0.0–0.7)
Lymphocytes Relative: 37.4 % (ref 12.0–46.0)
MCHC: 34 g/dL (ref 30.0–36.0)
Monocytes Relative: 7.5 % (ref 3.0–12.0)
Neutrophils Relative %: 51.8 % (ref 43.0–77.0)
Platelets: 278 10*3/uL (ref 150.0–400.0)
RBC: 4.21 Mil/uL (ref 3.87–5.11)
RDW: 13.7 % (ref 11.5–14.6)
WBC: 10 10*3/uL (ref 4.5–10.5)

## 2013-08-23 MED ORDER — ESOMEPRAZOLE MAGNESIUM 40 MG PO CPDR: 40.0000 mg | DELAYED_RELEASE_CAPSULE | Freq: Every day | ORAL | Status: DC

## 2013-08-23 NOTE — Progress Notes (Signed)
  History of Present Illness:   Patient is a 67 year old white female known to Dr. Juanda Chance. Patient has a history of documented diverticulitis in July of this year. She is s/p remote ventral hernia.repair by Dr Abbey Chatters. She also has a history of Nissen fundoplication.   Patient gives a several month history of RUQ pain. PCP referred her to Dr. Abbey Chatters in July for evaluation of this pain. Pain not felt to be related to ventral hernia repair, abdominal ultrasound was negative. EGD last month revealed candida esophagitis, mild antral gastritis. Gastric biopsies were negative. Esophageal biopsies c/w mild inflammation . The non-radiating RUQ pain is present everyday and is worse with food. Bending over can be painful. On Monday patient leaned over right side to pick up a piece of puzzle on the floor. She immediately heard a loud pop and subsequently developed severe RUQ pain. Pain lasted 5 minutes then resolved. Interestingly she hasn't had ANY RUQ pain (not even her chronic RUQ pain) since that "pop". However, yesterday morning patient had several BMs with red blood. Then this am she passed a solid black stool. No iron or bismuth products. No NSAID use.   Current Medications, Allergies, Past Medical History, Past Surgical History, Family History and Social History were reviewed in [employer] record.  Physical Exam: General: pleasant, well developed , white female in no acute distress Head: Normocephalic and atraumatic Eyes:  sclerae anicteric, conjunctiva pink  Ears: Normal auditory acuity Lungs: Clear throughout to auscultation Heart: Regular rate and rhythm Abdomen: Soft, non distended, mild localized RUQ tenderness. No masses, no hepatomegaly. Normal bowel sounds Rectal: Scant, brown, HEME NEGATIVE stool in vault. Some mildly inflamed internal hemorrhoids.  Musculoskeletal: Symmetrical with no gross deformities  Extremities: No edema  Neurological: Alert oriented x 4,  grossly nonfocal Psychological:  Alert and cooperative. Normal mood and affect  Assessment and Recommendations:  34. 67 year old female with rectal bleeding. She had loose stool with blood yesterday morning. This morning she passed a black stool. Etiology not clear, maybe low volume ischemic colitis or diverticular bleed. May just be hemorrhoidal though would not explain black stool.  Patient looks okay, vitals are stable, no blood in vault and on DRE has brown, HEME NEG stool. For precautionary measures will check labs, give her 3-4 weeks worth of a PPI to take once daily. No NSAID use. Treat hemorrhoids. Patient knows to call us over the weekend if she has recurrent bleeding. Otherwise she will call next week with condition update.     2. Diverticulosis. Patient just had screening colonoscopy last month.   3. RUQ, several month history. Pain worse with eating. Pain has resolved for now (for unclear reasons). Ultrasound negative. If pain recurs consider gallbladder emptying study.

## 2013-08-23 NOTE — Telephone Encounter (Signed)
Left a message for patient to call me. 

## 2013-08-23 NOTE — Patient Instructions (Signed)
Please go to the basement level to have your labs drawn.  Take no NSAIDS for right now. Take the Nexium capsules , 1 daily before breakfast.  Call over the weekend if you have black stools, ask to speak to the Doctor on call. [phone] otherwise call us next week with a progress report.

## 2013-08-23 NOTE — Telephone Encounter (Signed)
Patient states that last Monday, she bent to pick up a puzzle piece and something popped inside her. She has burning pain and stinging feeling in abdomen. She went to bed and rested and the pain went away. Yesterday, she had diarrhea with BRB and mucous in it. Today, she has had one formed stool that was "black as coal." Scheduled patient today at 2:30 PM with Willette Cluster, NP.

## 2013-08-25 NOTE — Progress Notes (Signed)
Agree with initial assessment and plans 

## 2013-09-09 ENCOUNTER — Telehealth: Payer: Self-pay | Admitting: Nurse Practitioner

## 2013-09-09 NOTE — Telephone Encounter (Signed)
Spoke with patient and she is doing much better. No more black stools. She is taking something from [facility] and it helps her pain within 30 minutes.

## 2013-09-19 ENCOUNTER — Other Ambulatory Visit: Payer: Self-pay

## 2014-01-06 ENCOUNTER — Encounter: Payer: Self-pay | Admitting: Cardiology

## 2014-01-09 ENCOUNTER — Ambulatory Visit: Payer: 59 | Admitting: Cardiology

## 2014-02-05 ENCOUNTER — Encounter: Payer: Self-pay | Admitting: Cardiology

## 2014-02-05 ENCOUNTER — Ambulatory Visit (INDEPENDENT_AMBULATORY_CARE_PROVIDER_SITE_OTHER): Payer: PRIVATE HEALTH INSURANCE | Admitting: Cardiology

## 2014-02-05 VITALS — BP 118/68 | HR 58 | Ht 67.5 in | Wt 205.0 lb

## 2014-02-05 DIAGNOSIS — E669 Obesity, unspecified: Secondary | ICD-10-CM

## 2014-02-05 DIAGNOSIS — I10 Essential (primary) hypertension: Secondary | ICD-10-CM

## 2014-02-05 MED ORDER — LISINOPRIL 40 MG PO TABS: 20.0000 mg | ORAL_TABLET | ORAL | Status: DC

## 2014-02-05 NOTE — Patient Instructions (Signed)
Your physician has recommended you make the following change in your medication:   1. Decrease Lisinopril to 20 mg once daily.  Your physician wants you to follow-up in: 6 month with Dr. Marlou Porch. You will receive a reminder letter in the mail two months in advance. If you don't receive a letter, please call our office to schedule the follow-up appointment.

## 2014-02-05 NOTE — Progress Notes (Signed)
[facility] [place], [place]  [address] Phone: [phone] Fax:  [phone]  Date:  02/05/2014   ID:  Angela [name], DOB 11-30-1945, MRN [id number]  PCP:  Lynne Logan, MD   History of Present Illness: Angela [name] is a 68 y.o. female with diabetes, hypertension, hyperlipidemia, anxiety, chronic kidney disease here for the evaluation of hypertension. She had been seeing Dr. Montez Morita in the past and he has been adjusting her medication. However, her insurance recently changed and he does not take this insurance so she cannot return to see him. Her blood pressures have been ranging in the 140s to 60s. She requested to see a new cardiologist.  2009 stress test, NUC. Low risk.  Carotid bruit. Right. Doppler 07/2013 - no significant disease bilaterally.   She has been doing well, no symptoms. Her blood pressure has been running low and that he usually does. Occasionally, once every 5 weeks when walking down her long hallway at her home, she will feel somewhat dizzy. She had a genetic profile done. Medicare bill $3200 a Medicare paid $1300.     Wt Readings from Last 3 Encounters:  02/05/14 205 lb (92.987 kg)  08/23/13 212 lb 12.8 oz (96.525 kg)  07/31/13 211 lb (95.709 kg)     Past Medical History  Diagnosis Date  . Diabetes mellitus   . Hypertension   . Seizures   . Depression     with anxiety  . Chronic kidney disease     chronic  . DDD (degenerative disc disease)   . Fibromyalgia   . COPD (chronic obstructive pulmonary disease)   . Sleep apnea     wears CPAP  . Diverticulitis   . Diverticulosis   . Esophageal dysmotility 03/01/06    mild  . Anemia 2014    mild    Past Surgical History  Procedure Laterality Date  . Appendectomy    . Tonsillectomy    . Gastric fundoplication    . Back surgery    . Hernia repair      Current Outpatient Prescriptions  Medication Sig Dispense Refill  . amLODipine (NORVASC) 10 MG tablet Take 10 mg by mouth every  evening.       Marland Kitchen aspirin 81 MG chewable tablet Chew 1 tablet (81 mg total) by mouth daily.      Marland Kitchen atorvastatin (LIPITOR) 20 MG tablet Take 20 mg by mouth daily.      . budesonide-formoterol (SYMBICORT) 160-4.5 MCG/ACT inhaler Inhale 2 puffs into the lungs 2 (two) times daily.      Marland Kitchen buPROPion (WELLBUTRIN XL) 150 MG 24 hr tablet 150 mg daily.       . cyclobenzaprine (FLEXERIL) 10 MG tablet Take 10 mg by mouth at bedtime. For muscle spasm      . esomeprazole (NEXIUM) 40 MG capsule Take 1 capsule (40 mg total) by mouth daily before breakfast.  20 capsule  0  . lisinopril (PRINIVIL,ZESTRIL) 40 MG tablet Take 1 tablet (40 mg total) by mouth daily.  30 tablet  0  . metFORMIN (GLUCOPHAGE) 500 MG tablet Take 500 mg by mouth 2 (two) times daily with a meal.      . metoprolol succinate (TOPROL-XL) 50 MG 24 hr tablet Take 50 mg by mouth daily. Take with or immediately following a meal.       No current facility-administered medications for this visit.    Allergies:    Allergies  Allergen Reactions  .  Aspirin Other (See Comments)    Turns tongue black  . Codeine Nausea And Vomiting  . Iohexol      Desc: Pt states several years ago during a CT scan w/ iv cotnrast she had severe nausea and vomiting, sob w/ difficulty breathing and swallowing.  She had full 13hr. premeds today (5/14/7) and did fine w/o complications.   . Ciprofloxacin Rash    Social History:  The patient  reports that she has quit smoking. She has never used smokeless tobacco. She reports that she does not drink alcohol or use illicit drugs.  She use to work in [employer].  ROS:  Please see the history of present illness.   No fevers, no chills, no bleeding    PHYSICAL EXAM: VS:  BP 118/68  Pulse 58  Ht 5' 7.5" (1.715 m)  Wt 205 lb (92.987 kg)  BMI 31.62 kg/m2 Well nourished, well developed, in no acute distress HEENT: normal Neck: no JVD Cardiac:  normal S1, S2; RRR; no murmur Lungs:  clear to auscultation  bilaterally, no wheezing, rhonchi or rales Abd: soft, nontender, no hepatomegaly Ext: no edema Skin: warm and dry Neuro: no focal abnormalities noted  EKG:  None today     ASSESSMENT AND PLAN:  1. Hypertension-currently well controlled on multidrug regimen. Since blood pressures have been lower and she's had some symptoms of dizziness occasionally, I will decrease her lisinopril to 20 mg from 40. 2. Obesity-continue to improve weight loss. She is doing quite well. She joined [employer]. As weight decreases, her blood pressure medication may decrease as well. 3. 6 month f/u.  Signed, Candee Furbish, MD [facility]  02/05/2014 1:27 PM

## 2014-07-08 ENCOUNTER — Emergency Department (HOSPITAL_COMMUNITY)
Admission: EM | Admit: 2014-07-08 | Discharge: 2014-07-08 | Disposition: A | Discharge status: Home / Self Care | Payer: PRIVATE HEALTH INSURANCE | Attending: Emergency Medicine | Admitting: Emergency Medicine

## 2014-07-08 ENCOUNTER — Encounter (HOSPITAL_COMMUNITY): Payer: Self-pay | Admitting: Emergency Medicine

## 2014-07-08 DIAGNOSIS — Z7982 Long term (current) use of aspirin: Secondary | ICD-10-CM | POA: Diagnosis not present

## 2014-07-08 DIAGNOSIS — R55 Syncope and collapse: Secondary | ICD-10-CM | POA: Diagnosis present

## 2014-07-08 DIAGNOSIS — R1032 Left lower quadrant pain: Secondary | ICD-10-CM | POA: Diagnosis not present

## 2014-07-08 DIAGNOSIS — IMO0001 Reserved for inherently not codable concepts without codable children: Secondary | ICD-10-CM | POA: Diagnosis not present

## 2014-07-08 DIAGNOSIS — F329 Major depressive disorder, single episode, unspecified: Secondary | ICD-10-CM | POA: Diagnosis not present

## 2014-07-08 DIAGNOSIS — F3289 Other specified depressive episodes: Secondary | ICD-10-CM | POA: Insufficient documentation

## 2014-07-08 DIAGNOSIS — Z8719 Personal history of other diseases of the digestive system: Secondary | ICD-10-CM | POA: Diagnosis not present

## 2014-07-08 DIAGNOSIS — Z862 Personal history of diseases of the blood and blood-forming organs and certain disorders involving the immune mechanism: Secondary | ICD-10-CM | POA: Insufficient documentation

## 2014-07-08 DIAGNOSIS — Z79899 Other long term (current) drug therapy: Secondary | ICD-10-CM | POA: Insufficient documentation

## 2014-07-08 DIAGNOSIS — Z87891 Personal history of nicotine dependence: Secondary | ICD-10-CM | POA: Insufficient documentation

## 2014-07-08 DIAGNOSIS — J449 Chronic obstructive pulmonary disease, unspecified: Secondary | ICD-10-CM | POA: Insufficient documentation

## 2014-07-08 DIAGNOSIS — I129 Hypertensive chronic kidney disease with stage 1 through stage 4 chronic kidney disease, or unspecified chronic kidney disease: Secondary | ICD-10-CM | POA: Diagnosis not present

## 2014-07-08 DIAGNOSIS — J4489 Other specified chronic obstructive pulmonary disease: Secondary | ICD-10-CM | POA: Insufficient documentation

## 2014-07-08 DIAGNOSIS — E119 Type 2 diabetes mellitus without complications: Secondary | ICD-10-CM | POA: Insufficient documentation

## 2014-07-08 DIAGNOSIS — N189 Chronic kidney disease, unspecified: Secondary | ICD-10-CM | POA: Insufficient documentation

## 2014-07-08 LAB — CBC WITH DIFFERENTIAL/PLATELET
Basophils Absolute: 0.1 10*3/uL (ref 0.0–0.1)
Basophils Relative: 2 % — ABNORMAL HIGH (ref 0–1)
Eosinophils Absolute: 0.2 10*3/uL (ref 0.0–0.7)
Eosinophils Relative: 3 % (ref 0–5)
HCT: 35.3 % — ABNORMAL LOW (ref 36.0–46.0)
Hemoglobin: 11.6 g/dL — ABNORMAL LOW (ref 12.0–15.0)
Lymphocytes Relative: 41 % (ref 12–46)
Lymphs Abs: 2.5 10*3/uL (ref 0.7–4.0)
MCH: 29.4 pg (ref 26.0–34.0)
MCHC: 32.9 g/dL (ref 30.0–36.0)
MCV: 89.4 fL (ref 78.0–100.0)
MONOS PCT: 9 % (ref 3–12)
Monocytes Absolute: 0.6 10*3/uL (ref 0.1–1.0)
Neutro Abs: 2.8 10*3/uL (ref 1.7–7.7)
Neutrophils Relative %: 45 % (ref 43–77)
PLATELETS: 243 10*3/uL (ref 150–400)
RBC: 3.95 MIL/uL (ref 3.87–5.11)
RDW: 14 % (ref 11.5–15.5)
WBC: 6.1 10*3/uL (ref 4.0–10.5)

## 2014-07-08 LAB — COMPREHENSIVE METABOLIC PANEL
ALBUMIN: 3.7 g/dL (ref 3.5–5.2)
ALT: 25 U/L (ref 0–35)
ANION GAP: 13 (ref 5–15)
AST: 27 U/L (ref 0–37)
Alkaline Phosphatase: 100 U/L (ref 39–117)
BUN: 26 mg/dL — AB (ref 6–23)
CALCIUM: 9.3 mg/dL (ref 8.4–10.5)
CHLORIDE: 104 meq/L (ref 96–112)
CO2: 20 mEq/L (ref 19–32)
CREATININE: 1.85 mg/dL — AB (ref 0.50–1.10)
GFR calc Af Amer: 32 mL/min — ABNORMAL LOW (ref >90)
GFR calc non Af Amer: 27 mL/min — ABNORMAL LOW (ref >90)
Glucose, Bld: 102 mg/dL — ABNORMAL HIGH (ref 70–99)
Potassium: 5.4 mEq/L — ABNORMAL HIGH (ref 3.7–5.3)
Sodium: 137 mEq/L (ref 137–147)
TOTAL PROTEIN: 6.8 g/dL (ref 6.0–8.3)

## 2014-07-08 LAB — URINALYSIS, ROUTINE W REFLEX MICROSCOPIC
Bilirubin Urine: NEGATIVE
Glucose, UA: NEGATIVE mg/dL
Hgb urine dipstick: NEGATIVE
Ketones, ur: NEGATIVE mg/dL
NITRITE: NEGATIVE
PH: 5.5 (ref 5.0–8.0)
Protein, ur: NEGATIVE mg/dL
Specific Gravity, Urine: 1.014 (ref 1.005–1.030)
Urobilinogen, UA: 0.2 mg/dL (ref 0.0–1.0)

## 2014-07-08 LAB — CBG MONITORING, ED: GLUCOSE-CAPILLARY: 102 mg/dL — AB (ref 70–99)

## 2014-07-08 LAB — I-STAT TROPONIN, ED: Troponin i, poc: 0.01 ng/mL (ref 0.00–0.08)

## 2014-07-08 LAB — URINE MICROSCOPIC-ADD ON

## 2014-07-08 MED ORDER — SODIUM CHLORIDE 0.9 % IV BOLUS (SEPSIS)
1000.0000 mL | Freq: Once | INTRAVENOUS | Status: AC
  Administered 2014-07-08: 1000 mL via INTRAVENOUS

## 2014-07-08 NOTE — ED Notes (Signed)
States was out shopping and started to feel lightheaded shaky  no cp no sob  , has eaten today sugar this am was 91 she took her bp and it was low, started tx antibiotics for diverticulitis  Las t Friday and she wanted to have it checked

## 2014-07-08 NOTE — ED Provider Notes (Signed)
CSN: [id number]     Arrival date & time 07/08/14  1316 History  This chart was scribed for non-physician practitioner, Cleatrice Burke, PA-C working with Delora Fuel, MD by Frederich Balding, ED scribe. This patient was seen in room C28C/C28C and the patient's care was started at 5:15 PM.   Chief Complaint  Patient presents with  . Near Syncope  . Abdominal Pain   The history is provided by the patient. No language interpreter was used.   HPI Comments: Angela [name] is a 68 y.o. female who presents to the Emergency Department complaining of intermittent light headedness and dizziness that started earlier today. She states the episodes start when she goes to stand up or sit down. Denies any actual syncopal episodes. Pt states that she was started on antibiotics for diverticulitis 4 days ago and wanted to make sure everything was okay with that. States she is still having LLQ pain but it is starting to resolve. Denies chest pain, SOB, nausea, emesis. Denies history of CHF. Pt reports stage 2 renal failure due to her diabetes.   Past Medical History  Diagnosis Date  . Diabetes mellitus   . Hypertension   . Seizures   . Depression     with anxiety  . Chronic kidney disease     chronic  . DDD (degenerative disc disease)   . Fibromyalgia   . COPD (chronic obstructive pulmonary disease)   . Sleep apnea     wears CPAP  . Diverticulitis   . Diverticulosis   . Esophageal dysmotility 03/01/06    mild  . Anemia 2014    mild   Past Surgical History  Procedure Laterality Date  . Appendectomy    . Tonsillectomy    . Gastric fundoplication    . Back surgery    . Hernia repair     Family History  Problem Relation Age of Onset  . COPD Father   . Colon cancer Maternal Aunt   . Stomach cancer Maternal Grandfather    History  Substance Use Topics  . Smoking status: Former Research scientist (life sciences)  . Smokeless tobacco: Never Used     Comment: Quit 1 year ago in June 2013.    Marland Kitchen Alcohol Use: No   OB History    Grav Para Term Preterm Abortions TAB SAB Ect Mult Living                 Review of Systems  Respiratory: Negative for shortness of breath.   Cardiovascular: Negative for chest pain.  Gastrointestinal: Positive for abdominal pain. Negative for nausea and vomiting.  Neurological: Positive for dizziness and light-headedness. Negative for syncope.  All other systems reviewed and are negative.  Allergies  Aspirin; Codeine; Iohexol; and Ciprofloxacin  Home Medications   Prior to Admission medications   Medication Sig Start Date End Date Taking? Authorizing Provider  amLODipine (NORVASC) 10 MG tablet Take 10 mg by mouth every evening.     Historical Provider, MD  aspirin 81 MG chewable tablet Chew 1 tablet (81 mg total) by mouth daily. 04/15/13   Orson Eva, MD  atorvastatin (LIPITOR) 20 MG tablet Take 20 mg by mouth daily.    Historical Provider, MD  budesonide-formoterol (SYMBICORT) 160-4.5 MCG/ACT inhaler Inhale 2 puffs into the lungs 2 (two) times daily.    Historical Provider, MD  buPROPion (WELLBUTRIN XL) 150 MG 24 hr tablet 150 mg daily.  01/20/14   Historical Provider, MD  cyclobenzaprine (FLEXERIL) 10 MG tablet Take 10 mg by  mouth at bedtime. For muscle spasm    Historical Provider, MD  esomeprazole (NEXIUM) 40 MG capsule Take 1 capsule (40 mg total) by mouth daily before breakfast. 08/23/13   Willia Craze, NP  lisinopril (PRINIVIL,ZESTRIL) 40 MG tablet Take 0.5 tablets (20 mg total) by mouth daily. 02/05/14   Candee Furbish, MD  metFORMIN (GLUCOPHAGE) 500 MG tablet Take 500 mg by mouth 2 (two) times daily with a meal.    Historical Provider, MD  metoprolol succinate (TOPROL-XL) 50 MG 24 hr tablet Take 50 mg by mouth daily. Take with or immediately following a meal.    Historical Provider, MD   BP 116/45  Pulse 56  Temp(Src) 98.2 F (36.8 C) (Oral)  Resp 18  SpO2 100%  Physical Exam  Nursing note and vitals reviewed. Constitutional: She is oriented to person, place, and time. She  appears well-developed and well-nourished. No distress.  HENT:  Head: Normocephalic and atraumatic.  Right Ear: External ear normal.  Left Ear: External ear normal.  Nose: Nose normal.  Mouth/Throat: Oropharynx is clear and moist.  Mucous membranes dry.  Eyes: Conjunctivae are normal.  Neck: Normal range of motion.  Cardiovascular: Normal rate, regular rhythm and normal heart sounds.   2+ radial and PT pulses bilaterally.   Pulmonary/Chest: Effort normal and breath sounds normal. No stridor. No respiratory distress. She has no wheezes. She has no rales.  Abdominal: Soft. Bowel sounds are normal. She exhibits no distension. There is tenderness.  Tender to palpation over LLQ.   Musculoskeletal: Normal range of motion.  Neurological: She is alert and oriented to person, place, and time. She has normal strength.  Skin: Skin is warm and dry. She is not diaphoretic. No erythema.  Psychiatric: She has a normal mood and affect. Her behavior is normal.    ED Course  Procedures (including critical care time)  DIAGNOSTIC STUDIES: Oxygen Saturation is 100% on RA, normal by my interpretation.    COORDINATION OF CARE: 5:19 PM-Pt states she does not need pain medication for anything right now. Discussed treatment plan which includes IV fluids with pt at bedside and pt agreed to plan.   Labs Review Labs Reviewed  CBC WITH DIFFERENTIAL - Abnormal; Notable for the following:    Hemoglobin 11.6 (*)    HCT 35.3 (*)    Basophils Relative 2 (*)    All other components within normal limits  COMPREHENSIVE METABOLIC PANEL - Abnormal; Notable for the following:    Potassium 5.4 (*)    Glucose, Bld 102 (*)    BUN 26 (*)    Creatinine, Ser 1.85 (*)    Total Bilirubin <0.2 (*)    GFR calc non Af Amer 27 (*)    GFR calc Af Amer 32 (*)    All other components within normal limits  URINALYSIS, ROUTINE W REFLEX MICROSCOPIC - Abnormal; Notable for the following:    APPearance HAZY (*)    Leukocytes, UA  TRACE (*)    All other components within normal limits  URINE MICROSCOPIC-ADD ON - Abnormal; Notable for the following:    Squamous Epithelial / LPF FEW (*)    Crystals URIC ACID CRYSTALS (*)    All other components within normal limits  CBG MONITORING, ED - Abnormal; Notable for the following:    Glucose-Capillary 102 (*)    All other components within normal limits  I-STAT TROPOININ, ED    Imaging Review No results found.   EKG Interpretation   Date/Time:  Tuesday  July 08 2014 13:35:33 EDT Ventricular Rate:  57 PR Interval:  180 QRS Duration: 84 QT Interval:  430 QTC Calculation: 418 R Axis:   -28 Text Interpretation:  Sinus bradycardia Low voltage QRS Borderline ECG  When compared with ECG of 04/13/2013, No significant change was found  Confirmed by [facility]  MD, DAVID ([id number]) on 07/08/2014 6:38:22 PM      MDM   Final diagnoses:  Near syncope    Patient presents to ED after near syncopal episode. No associated chest pain or shortness of breath. Consistent with vasovagal syncope. Labs unremarkable other than increase in creatinine. Patient knows she has some degree of CKD, but unsure what her baseline creatinine is. She will follow up with her PCP. Patient feels improved and ready for discharge home. Dr. Roxanne Mins evaluated patient and agrees with plan. Discussed reasons to return to ED immediately. Patient voices understanding. Vital signs stable for discharge. Patient / Family / Caregiver informed of clinical course, understand medical decision-making process, and agree with plan.   I personally performed the services described in this documentation, which was scribed in my presence. The recorded information has been reviewed and is accurate.  Elwyn Lade, PA-C 07/14/14 2142

## 2014-07-08 NOTE — ED Provider Notes (Signed)
68 year old female had 3 episodes today of a lightheaded feeling when she stood up. She states that she was in a car and crying to various places take her shopping and when she got out of the car, she had a sensation like a weight 5 being present that lasted a few seconds before going way. This occurred as she stood up. There was no spinning sensation. There is no dyspnea, nausea, chest pain or palpitations. She states that she normally drinks a lot of water and had been drinking somewhat less today. On exam, orthostatic vital signs showed no significant changes. Lungs are clear heart is regular rate and rhythm. There are no carotid bruits. Laboratory workup that showed an increase in creatinine from 1 year ago the patient states that her doctor is following her for some kidney problems. She does not know what her baseline creatinine is. At this point, it her symptoms appear to be secondary to orthostasis and she will be able to be discharged with close followup by PCP.  Medical screening examination/treatment/procedure(s) were conducted as a shared visit with non-physician practitioner(s) and myself.  I personally evaluated the patient during the encounter.   EKG Interpretation   Date/Time:  Tuesday July 08 2014 13:35:33 EDT Ventricular Rate:  57 PR Interval:  180 QRS Duration: 84 QT Interval:  430 QTC Calculation: 418 R Axis:   -28 Text Interpretation:  Sinus bradycardia Low voltage QRS Borderline ECG  When compared with ECG of 04/13/2013, No significant change was found  Confirmed by [facility]  MD, Almena Hokenson ([id number]) on 07/08/2014 6:38:22 PM        Delora Fuel, MD 32/35/57 [id number]

## 2014-07-08 NOTE — Discharge Instructions (Signed)
Near-Syncope Near-syncope (commonly known as near fainting) is sudden weakness, dizziness, or feeling like you might pass out. During an episode of near-syncope, you may also develop pale skin, have tunnel vision, or feel sick to your stomach (nauseous). Near-syncope may occur when getting up after sitting or while standing for a long time. It is caused by a sudden decrease in blood flow to the brain. This decrease can result from various causes or triggers, most of which are not serious. However, because near-syncope can sometimes be a sign of something serious, a medical evaluation is required. The specific cause is often not determined. HOME CARE INSTRUCTIONS  Monitor your condition for any changes. The following actions may help to alleviate any discomfort you are experiencing:  Have someone stay with you until you feel stable.  Lie down right away and prop your feet up if you start feeling like you might faint. Breathe deeply and steadily. Wait until all the symptoms have passed. Most of these episodes last only a few minutes. You may feel tired for several hours.   Drink enough fluids to keep your urine clear or pale yellow.   If you are taking blood pressure or heart medicine, get up slowly when seated or lying down. Take several minutes to sit and then stand. This can reduce dizziness.  Follow up with your health care provider as directed. SEEK IMMEDIATE MEDICAL CARE IF:   You have a severe headache.   You have unusual pain in the chest, abdomen, or back.   You are bleeding from the mouth or rectum, or you have black or tarry stool.   You have an irregular or very fast heartbeat.   You have repeated fainting or have seizure-like jerking during an episode.   You faint when sitting or lying down.   You have confusion.   You have difficulty walking.   You have severe weakness.   You have vision problems.  MAKE SURE YOU:   Understand these instructions.  Will  watch your condition.  Will get help right away if you are not doing well or get worse. Document Released: 10/31/2005 Document Revised: 11/05/2013 Document Reviewed: 04/05/2013 Exit[employer] Patient Information 2015 [employer]. This information is not intended to replace advice given to you by your health care provider. Make sure you discuss any questions you have with your health care provider.  

## 2014-07-10 ENCOUNTER — Emergency Department (HOSPITAL_COMMUNITY)
Admission: EM | Admit: 2014-07-10 | Discharge: 2014-07-10 | Disposition: A | Discharge status: Home / Self Care | Payer: PRIVATE HEALTH INSURANCE | Attending: Emergency Medicine | Admitting: Emergency Medicine

## 2014-07-10 ENCOUNTER — Emergency Department (HOSPITAL_COMMUNITY): Payer: PRIVATE HEALTH INSURANCE

## 2014-07-10 ENCOUNTER — Encounter (HOSPITAL_COMMUNITY): Payer: Self-pay | Admitting: Emergency Medicine

## 2014-07-10 DIAGNOSIS — G4730 Sleep apnea, unspecified: Secondary | ICD-10-CM | POA: Diagnosis not present

## 2014-07-10 DIAGNOSIS — F329 Major depressive disorder, single episode, unspecified: Secondary | ICD-10-CM | POA: Diagnosis not present

## 2014-07-10 DIAGNOSIS — I129 Hypertensive chronic kidney disease with stage 1 through stage 4 chronic kidney disease, or unspecified chronic kidney disease: Secondary | ICD-10-CM | POA: Insufficient documentation

## 2014-07-10 DIAGNOSIS — R110 Nausea: Secondary | ICD-10-CM | POA: Insufficient documentation

## 2014-07-10 DIAGNOSIS — J449 Chronic obstructive pulmonary disease, unspecified: Secondary | ICD-10-CM | POA: Diagnosis not present

## 2014-07-10 DIAGNOSIS — Z862 Personal history of diseases of the blood and blood-forming organs and certain disorders involving the immune mechanism: Secondary | ICD-10-CM | POA: Diagnosis not present

## 2014-07-10 DIAGNOSIS — E119 Type 2 diabetes mellitus without complications: Secondary | ICD-10-CM | POA: Diagnosis not present

## 2014-07-10 DIAGNOSIS — R42 Dizziness and giddiness: Secondary | ICD-10-CM | POA: Insufficient documentation

## 2014-07-10 DIAGNOSIS — Z8719 Personal history of other diseases of the digestive system: Secondary | ICD-10-CM | POA: Insufficient documentation

## 2014-07-10 DIAGNOSIS — Z87891 Personal history of nicotine dependence: Secondary | ICD-10-CM | POA: Insufficient documentation

## 2014-07-10 DIAGNOSIS — I498 Other specified cardiac arrhythmias: Secondary | ICD-10-CM | POA: Diagnosis not present

## 2014-07-10 DIAGNOSIS — Z79899 Other long term (current) drug therapy: Secondary | ICD-10-CM | POA: Insufficient documentation

## 2014-07-10 DIAGNOSIS — R51 Headache: Secondary | ICD-10-CM | POA: Diagnosis not present

## 2014-07-10 DIAGNOSIS — IMO0001 Reserved for inherently not codable concepts without codable children: Secondary | ICD-10-CM | POA: Insufficient documentation

## 2014-07-10 DIAGNOSIS — Z7982 Long term (current) use of aspirin: Secondary | ICD-10-CM | POA: Insufficient documentation

## 2014-07-10 DIAGNOSIS — J4489 Other specified chronic obstructive pulmonary disease: Secondary | ICD-10-CM | POA: Insufficient documentation

## 2014-07-10 DIAGNOSIS — F3289 Other specified depressive episodes: Secondary | ICD-10-CM | POA: Insufficient documentation

## 2014-07-10 DIAGNOSIS — R519 Headache, unspecified: Secondary | ICD-10-CM

## 2014-07-10 DIAGNOSIS — N189 Chronic kidney disease, unspecified: Secondary | ICD-10-CM | POA: Diagnosis not present

## 2014-07-10 LAB — I-STAT CHEM 8, ED
BUN: 24 mg/dL — ABNORMAL HIGH (ref 6–23)
BUN: 24 mg/dL — ABNORMAL HIGH (ref 6–23)
CHLORIDE: 109 meq/L (ref 96–112)
CREATININE: 1.9 mg/dL — AB (ref 0.50–1.10)
CREATININE: 1.8 mg/dL — AB (ref 0.50–1.10)
Calcium, Ion: 1.25 mmol/L (ref 1.13–1.30)
Calcium, Ion: 1.25 mmol/L (ref 1.13–1.30)
Chloride: 109 mEq/L (ref 96–112)
Glucose, Bld: 83 mg/dL (ref 70–99)
Glucose, Bld: 80 mg/dL (ref 70–99)
HCT: 35 % — ABNORMAL LOW (ref 36.0–46.0)
HCT: 34 % — ABNORMAL LOW (ref 36.0–46.0)
HEMOGLOBIN: 11.9 g/dL — AB (ref 12.0–15.0)
Hemoglobin: 11.6 g/dL — ABNORMAL LOW (ref 12.0–15.0)
POTASSIUM: 5.7 meq/L — AB (ref 3.7–5.3)
POTASSIUM: 5.6 meq/L — AB (ref 3.7–5.3)
SODIUM: 139 meq/L (ref 137–147)
Sodium: 135 mEq/L — ABNORMAL LOW (ref 137–147)
TCO2: 19 mmol/L (ref 0–100)
TCO2: 19 mmol/L (ref 0–100)

## 2014-07-10 LAB — URINALYSIS, ROUTINE W REFLEX MICROSCOPIC
Bilirubin Urine: NEGATIVE
Glucose, UA: NEGATIVE mg/dL
Hgb urine dipstick: NEGATIVE
Ketones, ur: NEGATIVE mg/dL
NITRITE: NEGATIVE
PH: 6 (ref 5.0–8.0)
Protein, ur: NEGATIVE mg/dL
Specific Gravity, Urine: 1.009 (ref 1.005–1.030)
Urobilinogen, UA: 0.2 mg/dL (ref 0.0–1.0)

## 2014-07-10 LAB — HEPATIC FUNCTION PANEL
ALBUMIN: 3.5 g/dL (ref 3.5–5.2)
ALT: 24 U/L (ref 0–35)
AST: 22 U/L (ref 0–37)
Alkaline Phosphatase: 85 U/L (ref 39–117)
Bilirubin, Direct: <0.2 mg/dL (ref 0.0–0.3)
TOTAL PROTEIN: 6.2 g/dL (ref 6.0–8.3)
Total Bilirubin: <0.2 mg/dL — ABNORMAL LOW (ref 0.3–1.2)

## 2014-07-10 LAB — BASIC METABOLIC PANEL
Anion gap: 13 (ref 5–15)
BUN: 25 mg/dL — ABNORMAL HIGH (ref 6–23)
CHLORIDE: 104 meq/L (ref 96–112)
CO2: 18 mEq/L — ABNORMAL LOW (ref 19–32)
Calcium: 9.4 mg/dL (ref 8.4–10.5)
Creatinine, Ser: 1.83 mg/dL — ABNORMAL HIGH (ref 0.50–1.10)
GFR calc Af Amer: 32 mL/min — ABNORMAL LOW (ref >90)
GFR, EST NON AFRICAN AMERICAN: 28 mL/min — AB (ref >90)
GLUCOSE: 90 mg/dL (ref 70–99)
POTASSIUM: 6 meq/L — AB (ref 3.7–5.3)
SODIUM: 135 meq/L — AB (ref 137–147)

## 2014-07-10 LAB — CBC
HCT: 35.6 % — ABNORMAL LOW (ref 36.0–46.0)
Hemoglobin: 11.7 g/dL — ABNORMAL LOW (ref 12.0–15.0)
MCH: 29.5 pg (ref 26.0–34.0)
MCHC: 32.9 g/dL (ref 30.0–36.0)
MCV: 89.7 fL (ref 78.0–100.0)
PLATELETS: 255 10*3/uL (ref 150–400)
RBC: 3.97 MIL/uL (ref 3.87–5.11)
RDW: 14 % (ref 11.5–15.5)
WBC: 6.5 10*3/uL (ref 4.0–10.5)

## 2014-07-10 LAB — URINE MICROSCOPIC-ADD ON

## 2014-07-10 LAB — LACTIC ACID, PLASMA: LACTIC ACID, VENOUS: 0.9 mmol/L (ref 0.5–2.2)

## 2014-07-10 LAB — I-STAT TROPONIN, ED: Troponin i, poc: 0 ng/mL (ref 0.00–0.08)

## 2014-07-10 MED ORDER — SODIUM CHLORIDE 0.9 % IV SOLN
Freq: Once | INTRAVENOUS | Status: AC
  Administered 2014-07-10: 18:00:00 via INTRAVENOUS

## 2014-07-10 MED ORDER — ONDANSETRON HCL 4 MG/2ML IJ SOLN
4.0000 mg | Freq: Once | INTRAMUSCULAR | Status: AC
  Administered 2014-07-10: 4 mg via INTRAVENOUS
  Filled 2014-07-10: qty 2

## 2014-07-10 MED ORDER — SODIUM CHLORIDE 0.9 % IV BOLUS (SEPSIS)
1000.0000 mL | Freq: Once | INTRAVENOUS | Status: AC
  Administered 2014-07-10: 1000 mL via INTRAVENOUS

## 2014-07-10 MED ORDER — SODIUM CHLORIDE 0.9 % IV SOLN
1.0000 g | Freq: Once | INTRAVENOUS | Status: AC
  Administered 2014-07-10: 1 g via INTRAVENOUS
  Filled 2014-07-10: qty 10

## 2014-07-10 MED ORDER — METOPROLOL SUCCINATE ER 25 MG PO TB24: 25.0000 mg | ORAL_TABLET | Freq: Every day | ORAL | Status: DC

## 2014-07-10 NOTE — Discharge Instructions (Signed)
Stop taking your Lisinopril until cleared by your doctor to restart this. Decreased you metoprolol dose to 25 mg. This means it is being cut in half. A prescription was given to you for the next 7 days. Do not take your old metoprolol if you start taking the new prescription for 25 mg once a day.

## 2014-07-10 NOTE — ED Notes (Signed)
Pt in MRI at this time 

## 2014-07-10 NOTE — ED Notes (Signed)
Pt returned from MRI placed back on cardiac monitor, b/p and pulse ox.  Husband at bedside.

## 2014-07-10 NOTE — ED Provider Notes (Signed)
CSN: [id number]     Arrival date & time 07/10/14  1422 History   First MD Initiated Contact with Patient 07/10/14 1537     Chief Complaint  Patient presents with  . Nausea  . Headache  . Dizziness     (Consider location/radiation/quality/duration/timing/severity/associated sxs/prior Treatment) HPI Comments: Angela [name] 68 y.o. With a pmh of CKD presents from home for headache, lightheadedness, and nausea. Was seen here for low blood pressure 2 days ago and no sig etiology was found of her symptoms. Returns now with headache, nausea and worsening lightheadedness.   Patient is a 68 y.o. female presenting with headaches and dizziness. The history is provided by the patient.  Headache Pain location:  Generalized Quality:  Dull Radiates to:  Does not radiate Severity currently:  4/10 Severity at highest:  5/10 Onset quality:  Gradual Duration:  3 days Timing:  Intermittent Progression:  Worsening Chronicity:  Recurrent Similar to prior headaches: no   Context: not activity, not exposure to bright light, not caffeine, not coughing, not defecating, not eating, not stress, not intercourse, not loud noise and not straining   Relieved by:  Nothing Worsened by:  Nothing tried Ineffective treatments:  Acetaminophen Associated symptoms: dizziness and nausea   Associated symptoms: no back pain, no congestion, no cough, no diarrhea, no ear pain, no facial pain, no fatigue, no fever, no focal weakness, no loss of balance, no myalgias, no near-syncope, no neck pain, no neck stiffness, no numbness, no paresthesias, no photophobia, no sore throat, no syncope, no vomiting and no weakness   Dizziness Associated symptoms: headaches and nausea   Associated symptoms: no diarrhea, no syncope, no vomiting and no weakness     Past Medical History  Diagnosis Date  . Diabetes mellitus   . Hypertension   . Seizures   . Depression     with anxiety  . Chronic kidney disease     chronic  . DDD  (degenerative disc disease)   . Fibromyalgia   . COPD (chronic obstructive pulmonary disease)   . Sleep apnea     wears CPAP  . Diverticulitis   . Diverticulosis   . Esophageal dysmotility 03/01/06    mild  . Anemia 2014    mild   Past Surgical History  Procedure Laterality Date  . Appendectomy    . Tonsillectomy    . Gastric fundoplication    . Back surgery    . Hernia repair     Family History  Problem Relation Age of Onset  . COPD Father   . Colon cancer Maternal Aunt   . Stomach cancer Maternal Grandfather    History  Substance Use Topics  . Smoking status: Former Research scientist (life sciences)  . Smokeless tobacco: Never Used     Comment: Quit 1 year ago in June 2013.    Marland Kitchen Alcohol Use: No   OB History   Grav Para Term Preterm Abortions TAB SAB Ect Mult Living                 Review of Systems  Constitutional: Negative for fever and fatigue.  HENT: Negative for congestion, ear pain and sore throat.   Eyes: Negative for photophobia.  Respiratory: Negative for cough.   Cardiovascular: Negative for syncope and near-syncope.  Gastrointestinal: Positive for nausea. Negative for vomiting and diarrhea.  Musculoskeletal: Negative for back pain, myalgias, neck pain and neck stiffness.  Neurological: Positive for dizziness and headaches. Negative for focal weakness, numbness, paresthesias and loss of balance.  All other systems reviewed and are negative.     Allergies  Aspirin; Codeine; Iohexol; and Ciprofloxacin  Home Medications   Prior to Admission medications   Medication Sig Start Date End Date Taking? Authorizing Provider  amLODipine (NORVASC) 10 MG tablet Take 10 mg by mouth every evening.     Historical Provider, MD  aspirin 81 MG chewable tablet Chew 1 tablet (81 mg total) by mouth daily. 04/15/13   Orson Eva, MD  atorvastatin (LIPITOR) 20 MG tablet Take 20 mg by mouth daily.    Historical Provider, MD  budesonide-formoterol (SYMBICORT) 160-4.5 MCG/ACT inhaler Inhale 2 puffs  into the lungs 2 (two) times daily.    Historical Provider, MD  buPROPion (WELLBUTRIN XL) 150 MG 24 hr tablet 150 mg daily.  01/20/14   Historical Provider, MD  cyclobenzaprine (FLEXERIL) 10 MG tablet Take 10 mg by mouth at bedtime. For muscle spasm    Historical Provider, MD  esomeprazole (NEXIUM) 40 MG capsule Take 1 capsule (40 mg total) by mouth daily before breakfast. 08/23/13   Willia Craze, NP  hydrochlorothiazide (HYDRODIURIL) 25 MG tablet  06/26/14   Historical Provider, MD  lisinopril (PRINIVIL,ZESTRIL) 40 MG tablet Take 0.5 tablets (20 mg total) by mouth daily. 02/05/14   Candee Furbish, MD  metFORMIN (GLUCOPHAGE) 500 MG tablet Take 500 mg by mouth 2 (two) times daily with a meal.    Historical Provider, MD  metoprolol succinate (TOPROL-XL) 50 MG 24 hr tablet Take 50 mg by mouth daily. Take with or immediately following a meal.    Historical Provider, MD  metroNIDAZOLE (FLAGYL) 500 MG tablet  07/03/14   Historical Provider, MD  sulfamethoxazole-trimethoprim (BACTRIM DS) 800-160 MG per tablet  07/03/14   Historical Provider, MD   BP 138/59  Pulse 48  Temp(Src) 98.3 F (36.8 C) (Oral)  Resp 19  SpO2 98% Physical Exam  Constitutional: She is oriented to person, place, and time. She appears well-developed and well-nourished. No distress.  HENT:  Head: Normocephalic and atraumatic.  Right Ear: External ear normal.  Left Ear: External ear normal.  Eyes: Conjunctivae and EOM are normal. Right eye exhibits no discharge. Left eye exhibits no discharge.  Neck: Normal range of motion. Neck supple. No JVD present.  Cardiovascular: Regular rhythm and normal heart sounds.  Bradycardia present.  Exam reveals no gallop and no friction rub.   No murmur heard. Pulses:      Radial pulses are 2+ on the right side, and 2+ on the left side.  Pulmonary/Chest: Effort normal and breath sounds normal. No stridor. No respiratory distress. She has no wheezes. She has no rales. She exhibits no tenderness.   Abdominal: Soft. Bowel sounds are normal. She exhibits no distension. There is no tenderness. There is no rebound and no guarding.  Musculoskeletal: Normal range of motion. She exhibits no edema.  Neurological: She is alert and oriented to person, place, and time. No cranial nerve deficit. GCS eye subscore is 4. GCS verbal subscore is 5. GCS motor subscore is 6.  Strength in flexion and extension at the shoulders, elbows, wrists, hips, knee, and ankle 5/5 bilaterally EHL 5/5 bilaterally Grip strength 5/5 bilaterally Finger to nose, heel toe shin and rapid alternating movements intact bilaterally. Sensation over the dorsum of the hand over the 1st, second and 5th metacarpal, and the lateral forearm and lateral shoulder intact bilaterally.  Sensation over the medial and lateral malleolus, and over the 1st metatarsal intact bilaterally.  Skin: Skin is warm. No rash  noted. She is not diaphoretic.  Psychiatric: She has a normal mood and affect. Her behavior is normal.    ED Course  Procedures (including critical care time) Labs Review Labs Reviewed  CBC - Abnormal; Notable for the following:    Hemoglobin 11.7 (*)    HCT 35.6 (*)    All other components within normal limits  BASIC METABOLIC PANEL  URINALYSIS, ROUTINE W REFLEX MICROSCOPIC  LACTIC ACID, PLASMA  I-STAT TROPOININ, ED    Imaging Review No results found.   EKG Interpretation None      MDM   Final diagnoses:  None    Patient returns for ongoing N, dizziness and headache. No chest pain, HA, neck pain, vomiting, diarrhea, bleeding. HR in the low 50's similarly to her recent ED visit. Initial BP 108/50, with ongoing BPs being higher. AF. No acute distress. Orthostatic negative. Hgb unchanged from previous visit, doubt acute loss. No sig dehydration based on UA, but she did received fluids and antiemetics with improvement. K 5.7 and repated and this was similar. ECG not suggestive of hyperkalemia. Repeat K similar at 5.6.  UA not suggestive of UTI. Normal neuro exam including cerebellar exam. MRI brain neg for acute infarct. I have a low suspicion this is related to a posterior stroke. Given she is on a beta blocker and an ACEi, this could be related to this. She was instructed to stop the ACEi. The MR notes she is on 50 mg Toprol once a day. However she reports she is taking half a tab of 50 mg. I originally instructed her to decreased the dose to 25 mg a day, however, given this is what she is likely taking, she was instructed to stop taking the metoprolol and will need to follow up with her doctor in the next week to have her BP medical mgt further evaluated. Strong return precautions given for worsening symptoms or any other alarming or concerning symptoms or issues. The patient was in agreement with the treatment plan and I answered all of their questions. The patient was stable for dc. At dc, the patient ambulated without difficulty, was moving all four extremities, symptoms improved, NAD. and AOx4 Care discussed with my attending, Dr. Jeneen Rinks. If performed and available, imaging studies and labs reviewed.    Kelby Aline, MD 07/11/14 Laureen Abrahams

## 2014-07-10 NOTE — ED Notes (Signed)
MD at bedside. 

## 2014-07-10 NOTE — ED Notes (Signed)
Pt c/o headache with nausea.  Also st's she also has had hypotension.  Pt st's she had a sharp pain in her abd yesterday.

## 2014-07-10 NOTE — ED Provider Notes (Signed)
Patient seen and evaluated. Complains of a headache and nausea today. Also dizziness and lightheadedness for several days. Not vertigo. Not typical for migraine. Also occasionally low low 50s. Blood pressure lower than normal with diastolics of 34-74 today. I agree with MRI to rule out a posterior circulation abnormalities. Not showing any neurological findings at this time. Almost asymptomatic after IV fluids and Zofran. He said any blood pressure medication adjustments. Labs, and MRI pending. Pt discussed with Dr. Lucrezia Starch.  Tanna Furry, MD 07/10/14 1723

## 2014-07-10 NOTE — ED Notes (Signed)
States was here two days ago with similar symptoms-- dizziness, nausea, headache.  Has same symptoms today-- states "My BP is running low-- like 130/31"  Also has been drinking lots of water to try to boost bp.

## 2014-07-16 ENCOUNTER — Ambulatory Visit (INDEPENDENT_AMBULATORY_CARE_PROVIDER_SITE_OTHER): Payer: PRIVATE HEALTH INSURANCE | Admitting: Cardiology

## 2014-07-16 ENCOUNTER — Other Ambulatory Visit: Payer: Self-pay | Admitting: *Deleted

## 2014-07-16 ENCOUNTER — Encounter: Payer: Self-pay | Admitting: Cardiology

## 2014-07-16 VITALS — BP 126/72 | HR 52 | Ht 67.5 in | Wt 201.8 lb

## 2014-07-16 DIAGNOSIS — I10 Essential (primary) hypertension: Secondary | ICD-10-CM

## 2014-07-16 MED ORDER — METOPROLOL SUCCINATE 12.5 MG HALF TABLET: 12.5000 mg | ORAL_TABLET | Freq: Every day | ORAL | Status: DC

## 2014-07-16 MED ORDER — METOPROLOL SUCCINATE ER 25 MG PO TB24: 12.5000 mg | ORAL_TABLET | Freq: Every day | ORAL | Status: DC

## 2014-07-16 NOTE — Patient Instructions (Signed)
Your physician has recommended you make the following change in your medication: Decrease Metoprolol ( 12. 5 mg ) daily. Sent in to [facility], 73 day supply  Your physician wants you to follow-up in: 6 months with Dr. Marlou Porch.  You will receive a reminder letter in the mail two months in advance. If you don't receive a letter, please call our office to schedule the follow-up appointment @ [phone].

## 2014-07-16 NOTE — Progress Notes (Signed)
07/16/2014 Angela [name]   [birth year]  [id number]  Primary Physicia Angela Logan, MD Primary Cardiologist: Angela [name]  HPI:  Angela [name] presents to clinic today for followup after a recent visit at the [facility] Emergency department, where she was evaluated for dizziness and near syncope. She is a 68 yr old female, followed by Angela [name], with a history of treated hypertension, diabetes (well-controlled; last hemoglobin A1c 5.8) and hyperlipidemia. She had a nuclear stress test in 2009 that was negative for ischemia. Her hypertension has been controlled by multiple agents including amlodipine, lisinopril, hydrochlorothiazide and metoprolol. Her last office visit with Angela [name] was in March of 2015. Per Angela [name] office note,  she had complained of occasional symptoms of dizziness and had systolic blood pressures documented in the low 100s. Subsequently, he decreased her lisinopril from 40 mg down to 20 mg. Other than that, she was felt to be stable from a cardiac standpoint and was instructed to followup in 6 months.  She presented to the [facility] Emergency department on 07/10/2014. She complained of dizziness, lightheadedness and near syncope. She denied chest pain and dyspnea. On arrival, she was noted to be bradycardic in the 50s. Her blood pressure was 108/50. Orthostatics were negative. Potassium was elevated at 5.7. Repeat potassium was similar at 5.6. UA was not suggestive of UTI and no signs of dehydration, however she was treated with IV fluids and had improvement in symptoms. MRI of  the brain was negative. She was discharged home from the ED and was instructed to followup in our office. Her lisinopril was discontinued and her metoprolol was reduced from 50 mg down to 25 mg daily, by the ED physician.  She presents back to clinic today for followup. She denies any recurrent dizziness, lightheadedness, syncope/near-syncope. However, she continues to feel tired and fatigued. She denies chest  pain or dyspnea.  Her blood pressure today in clinic is 126/72. EKG demonstrates sinus bradycardia with a pulse rate of 52 beats per minute.  Current Outpatient Prescriptions  Medication Sig Dispense Refill  . amLODipine (NORVASC) 10 MG tablet Take 10 mg by mouth every evening.       Marland Kitchen atorvastatin (LIPITOR) 20 MG tablet Take 20 mg by mouth daily.      . budesonide-formoterol (SYMBICORT) 160-4.5 MCG/ACT inhaler Inhale 2 puffs into the lungs 2 (two) times daily.      Marland Kitchen buPROPion (WELLBUTRIN XL) 150 MG 24 hr tablet Take 150 mg by mouth daily.       . cyclobenzaprine (FLEXERIL) 10 MG tablet Take 10 mg by mouth at bedtime. For muscle spasm      . hydrochlorothiazide (HYDRODIURIL) 25 MG tablet Take 25 mg by mouth daily.       . metFORMIN (GLUCOPHAGE) 500 MG tablet Take 500 mg by mouth 2 (two) times daily with a meal.      . metoprolol succinate (TOPROL-XL) 25 MG 24 hr tablet Take 1 tablet (25 mg total) by mouth daily.  7 tablet  0   No current facility-administered medications for this visit.    Allergies  Allergen Reactions  . Aspirin Other (See Comments)    Turns tongue black  . Codeine Nausea And Vomiting  . Iohexol      Desc: Pt states several years ago during a CT scan w/ iv cotnrast she had severe nausea and vomiting, sob w/ difficulty breathing and swallowing.  She had full 13hr. premeds today (5/14/7) and did fine w/o complications.   Marland Kitchen  Ciprofloxacin Rash    History   Social History  . Marital Status: Divorced    Spouse Name: N/A    Number of Children: 2  . Years of Education: N/A   Occupational History  . retired    Social History Main Topics  . Smoking status: Former Research scientist (life sciences)  . Smokeless tobacco: Never Used     Comment: Quit 1 year ago in June 2013.    Marland Kitchen Alcohol Use: No  . Drug Use: No  . Sexual Activity: Yes    Birth Control/ Protection: None   Other Topics Concern  . Not on file   Social History Narrative  . No narrative on file     Review of  Systems: General: negative for chills, fever, night sweats or weight changes.  Cardiovascular: negative for chest pain, dyspnea on exertion, edema, orthopnea, palpitations, paroxysmal nocturnal dyspnea or shortness of breath Dermatological: negative for rash Respiratory: negative for cough or wheezing Urologic: negative for hematuria Abdominal: negative for nausea, vomiting, diarrhea, bright red blood per rectum, melena, or hematemesis Neurologic: negative for visual changes, syncope, or dizziness All other systems reviewed and are otherwise negative except as noted above.    Blood pressure 126/72, pulse 52, height 5' 7.5" (1.715 m), weight 201 lb 12.8 oz (91.536 kg).  General appearance: alert, cooperative and no distress Neck: no carotid bruit and no JVD Lungs: clear to auscultation bilaterally Heart: brady but regular Extremities: no LEE Pulses: 2+ and symmetric Skin: warm and dry Neurologic: Grossly normal  EKG Sinus bradycardia, 52 bpm  ASSESSMENT AND [name]:   1. Sinus bradycardia: Patient continues to have low heart rates and is mildly symptomatic with fatigue, despite recent decreases in her metoprolol succinate . We'll further decrease metoprolol to 12.5 mg daily.  2. Borderline Hypotension: Blood pressure has improved with discontinuation of lisinopril. She has remained on amlodipine and hydrochlorothiazide for hypertension. Blood pressure today is stable at 126/72. In an effort to better treat problem #1, I have instructed patient to reduce her beta blocker. Thus, I have instructed the patient to monitor her blood pressure closely at home. She reports that she has a blood pressure monitor that also has capabilities of checking heart rate. She is to notify our office if she develops subsequent hypertension, as one of her other antihypertensives may need to be readjusted.  [name]  Reduce metoprolol-XL to 12.5 mg daily. Followup with Angela [name] in 6 months for  reevaluation  Angela [name], [facility] 07/16/2014 12:37 PM

## 2014-07-17 NOTE — ED Provider Notes (Signed)
Medical screening examination/treatment/procedure(s) were performed by non-physician practitioner and as supervising physician I was immediately available for consultation/collaboration.   EKG Interpretation   Date/Time:  Tuesday July 08 2014 13:35:33 EDT Ventricular Rate:  57 PR Interval:  180 QRS Duration: 84 QT Interval:  430 QTC Calculation: 418 R Axis:   -28 Text Interpretation:  Sinus bradycardia Low voltage QRS Borderline ECG  When compared with ECG of 04/13/2013, No significant change was found  Confirmed by [facility]  MD, Dandria Griego ([id number]) on 07/08/2014 6:38:22 PM        Delora Fuel, MD 96/22/29 7989

## 2014-07-17 NOTE — ED Provider Notes (Signed)
I saw and evaluated the patient, reviewed the resident's note and I agree with the findings and plan.   EKG Interpretation   Date/Time:  Thursday July 10 2014 15:00:21 EDT Ventricular Rate:  52 PR Interval:  184 QRS Duration: 82 QT Interval:  422 QTC Calculation: 392 R Axis:   21 Text Interpretation:  Sinus bradycardia Low voltage QRS Cannot rule out  Anterior infarct , age undetermined Abnormal ECG ED PHYSICIAN  INTERPRETATION AVAILABLE IN [place] Confirmed by TEST, Record  ([id number]) on 07/12/2014 8:53:57 AM      Please see my additional dictation  Tanna Furry, MD 07/17/14 2109

## 2014-08-29 ENCOUNTER — Other Ambulatory Visit: Payer: Self-pay

## 2014-10-15 ENCOUNTER — Other Ambulatory Visit: Payer: Self-pay

## 2014-10-15 DIAGNOSIS — Z1231 Encounter for screening mammogram for malignant neoplasm of breast: Secondary | ICD-10-CM

## 2014-10-21 ENCOUNTER — Ambulatory Visit
Admission: RE | Admit: 2014-10-21 | Discharge: 2014-10-21 | Disposition: A | Discharge status: Home / Self Care | Payer: PRIVATE HEALTH INSURANCE | Source: Ambulatory Visit

## 2014-10-21 DIAGNOSIS — Z1231 Encounter for screening mammogram for malignant neoplasm of breast: Secondary | ICD-10-CM

## 2014-10-23 ENCOUNTER — Other Ambulatory Visit: Payer: Self-pay | Admitting: Family Medicine

## 2014-10-23 DIAGNOSIS — R928 Other abnormal and inconclusive findings on diagnostic imaging of breast: Secondary | ICD-10-CM

## 2014-10-30 ENCOUNTER — Ambulatory Visit
Admission: RE | Admit: 2014-10-30 | Discharge: 2014-10-30 | Disposition: A | Discharge status: Home / Self Care | Payer: PRIVATE HEALTH INSURANCE | Source: Ambulatory Visit | Attending: Family Medicine | Admitting: Family Medicine

## 2014-10-30 DIAGNOSIS — R928 Other abnormal and inconclusive findings on diagnostic imaging of breast: Secondary | ICD-10-CM

## 2014-11-05 ENCOUNTER — Ambulatory Visit (HOSPITAL_BASED_OUTPATIENT_CLINIC_OR_DEPARTMENT_OTHER): Payer: PRIVATE HEALTH INSURANCE | Attending: Otolaryngology | Admitting: Radiology

## 2014-11-05 VITALS — Ht 67.0 in | Wt 201.0 lb

## 2014-11-05 DIAGNOSIS — Z6831 Body mass index (BMI) 31.0-31.9, adult: Secondary | ICD-10-CM | POA: Insufficient documentation

## 2014-11-05 DIAGNOSIS — G4733 Obstructive sleep apnea (adult) (pediatric): Secondary | ICD-10-CM

## 2014-11-05 DIAGNOSIS — G4710 Hypersomnia, unspecified: Secondary | ICD-10-CM | POA: Insufficient documentation

## 2014-11-05 DIAGNOSIS — G4730 Sleep apnea, unspecified: Secondary | ICD-10-CM | POA: Insufficient documentation

## 2014-11-09 DIAGNOSIS — G4733 Obstructive sleep apnea (adult) (pediatric): Secondary | ICD-10-CM

## 2014-11-09 NOTE — Sleep Study (Signed)
   NAME: Angela [name] DATE OF BIRTH:  [birth year] MEDICAL RECORD NUMBER [id number]  LOCATION: [facility]  PHYSICIAN: YOUNG,CLINTON D  DATE OF STUDY: 11/05/2014  SLEEP STUDY TYPE: Nocturnal Polysomnogram               REFERRING PHYSICIAN: Jodi Marble, MD  INDICATION FOR STUDY: Hypersomnia with sleep apnea  EPWORTH SLEEPINESS SCORE:   0/24 HEIGHT: 5\' 7"  (170.2 cm)  WEIGHT: 201 lb (91.173 kg)    Body mass index is 31.47 kg/(m^2).  NECK SIZE: 15.5 in.  MEDICATIONS: Charted for review  SLEEP ARCHITECTURE: Split study protocol. Her in the diagnostic phase, total sleep time 172.5 minutes with sleep efficiency 80.2%. Stage I was 19.7%, stage II 71%, stage III absent, REM 9.3% of total sleep time. Sleep latency 12 minutes, REM latency 133 minutes, awake after sleep onset 30.5 minutes, arousal index 30.3, bedtime medication: None  RESPIRATORY DATA: Apnea hypopnea index (AHI) 23.7 per hour. 68 total events scored including 17 obstructive apneas, 1 central apnea, 50 hypopneas. Events were seen in all positions. REM AHI 56.3 per hour. CPAP titration to 8 CWP, AHI 1.2 per hour. She wore a nasal pillows mask.  OXYGEN DATA: Moderate snoring before CPAP with oxygen desaturation to a nadir of 81% on room air. With CPAP control, snoring was prevented and mean oxygen saturation was 94.1%.  CARDIAC DATA: Sinus rhythm  MOVEMENT/PARASOMNIA: No significant movement disturbance, bathroom 2  IMPRESSION/ RECOMMENDATION:   1) Moderate obstructive sleep apnea/hypopnea syndrome, AHI 23.7 per hour with non-positional events. REM AHI 56.3 per hour. Moderate snoring with oxygen desaturation to a nadir of 81% on room air. 2) Successful CPAP titration to 8 CWP, AHI 1.2 per hour. She wore a Respironics Dream Wear nasal pillow mask with medium headgear and small pillows, heated humidifier. Snoring was prevented and mean oxygen saturation was 94.1%.  Deneise Lever Diplomate, American Board of  Sleep Medicine  ELECTRONICALLY SIGNED ON:  11/09/2014, 11:17 AM [place] PH: [phone]   FX: [phone] [place]

## 2015-01-05 ENCOUNTER — Encounter (HOSPITAL_BASED_OUTPATIENT_CLINIC_OR_DEPARTMENT_OTHER): Payer: PRIVATE HEALTH INSURANCE

## 2015-01-29 ENCOUNTER — Ambulatory Visit (INDEPENDENT_AMBULATORY_CARE_PROVIDER_SITE_OTHER): Payer: Medicare Other | Admitting: Cardiology

## 2015-01-29 ENCOUNTER — Encounter: Payer: Self-pay | Admitting: Cardiology

## 2015-01-29 ENCOUNTER — Other Ambulatory Visit: Payer: Self-pay | Admitting: *Deleted

## 2015-01-29 VITALS — BP 126/72 | HR 65 | Ht 67.0 in | Wt 211.0 lb

## 2015-01-29 DIAGNOSIS — E669 Obesity, unspecified: Secondary | ICD-10-CM

## 2015-01-29 DIAGNOSIS — I10 Essential (primary) hypertension: Secondary | ICD-10-CM | POA: Diagnosis not present

## 2015-01-29 NOTE — Progress Notes (Signed)
[name]. [address], [place]  [address] Phone: [phone] Fax:  [phone]  Date:  01/29/2015   ID:  KEELIA [name], DOB 08/11/1946, MRN [id number]  PCP:  Lynne Logan, MD   History of Present Illness: Angela [name] is a 69 y.o. female with diabetes, hypertension, hyperlipidemia, anxiety, chronic kidney disease here for the follow-up of hypertension.  She had been seeing Dr. Montez Morita in the past and he has been adjusting her medication. However, her insurance recently changed and he does not take this insurance so she cannot return to see him.  2009 stress test, NUC. Low risk.  Carotid bruit. Right. Doppler 07/2013 - no significant disease bilaterally.   In the past, we had to decrease her metoprolol because of bradycardia. This is improved. She also after having weight loss, stopped her lisinopril however because of recent weight gain over the holidays, she has resumed half of her 40 mg tablet or 20 mg. Her blood pressure has been under good control.  In the distant past, her blood pressure was always 229 systolic. This changed however as she aged.  She also had been experiencing photophobia and was told that she had migraine without pain. She also sometimes gets a rash on her left inner ankle.     Wt Readings from Last 3 Encounters:  01/29/15 211 lb (95.709 kg)  11/05/14 201 lb (91.173 kg)  07/16/14 201 lb 12.8 oz (91.536 kg)     Past Medical History  Diagnosis Date  . Diabetes mellitus   . Hypertension   . Seizures   . Depression     with anxiety  . Chronic kidney disease     chronic  . DDD (degenerative disc disease)   . Fibromyalgia   . COPD (chronic obstructive pulmonary disease)   . Sleep apnea     wears CPAP  . Diverticulitis   . Diverticulosis   . Esophageal dysmotility 03/01/06    mild  . Anemia 2014    mild    Past Surgical History  Procedure Laterality Date  . Appendectomy    . Tonsillectomy    . Gastric fundoplication    .  Back surgery    . Hernia repair      Current Outpatient Prescriptions  Medication Sig Dispense Refill  . ACCU-CHEK AVIVA PLUS test strip   1  . amLODipine (NORVASC) 5 MG tablet   1  . atorvastatin (LIPITOR) 40 MG tablet   2  . B Complex-C (SUPER B-C PO) Take by mouth.    . budesonide-formoterol (SYMBICORT) 160-4.5 MCG/ACT inhaler Inhale 2 puffs into the lungs 2 (two) times daily.    Marland Kitchen buPROPion (WELLBUTRIN XL) 150 MG 24 hr tablet Take 150 mg by mouth daily.     . cyclobenzaprine (FLEXERIL) 10 MG tablet Take 10 mg by mouth at bedtime. For muscle spasm    . fluticasone (FLONASE) 50 MCG/ACT nasal spray   4  . hydrochlorothiazide (HYDRODIURIL) 25 MG tablet Take 25 mg by mouth daily.     . metFORMIN (GLUCOPHAGE) 500 MG tablet Take 500 mg by mouth 2 (two) times daily with a meal.    . metoprolol succinate (TOPROL-XL) 25 MG 24 hr tablet Take 0.5 tablets (12.5 mg total) by mouth daily. 90 tablet 2  . Multiple Vitamin (STRESS FORMULA 500/BIOTIN PO) Take by mouth.    . Probiotic Product (PROBIOTIC DAILY PO) Take by mouth.     No current facility-administered medications  for this visit.    Allergies:    Allergies  Allergen Reactions  . Aspirin Other (See Comments)    Turns tongue black  . Codeine Nausea And Vomiting  . Iohexol      Desc: Pt states several years ago during a CT scan w/ iv cotnrast she had severe nausea and vomiting, sob w/ difficulty breathing and swallowing.  She had full 13hr. premeds today (5/14/7) and did fine w/o complications.   . Ciprofloxacin Rash    Social History:  The patient  reports that she has quit smoking. She has never used smokeless tobacco. She reports that she does not drink alcohol or use illicit drugs.  She use to work in Press photographer.  ROS:  Please see the history of present illness.   No fevers, no chills, no bleeding    PHYSICAL EXAM: VS:  BP 126/72 mmHg  Pulse 65  Ht 5\' 7"  (1.702 m)  Wt 211 lb (95.709 kg)  BMI 33.04 kg/m2 Well nourished,  well developed, in no acute distress HEENT: normal Neck: no JVD Cardiac:  normal S1, S2; RRR; no murmur Lungs:  clear to auscultation bilaterally, no wheezing, rhonchi or rales Abd: soft, nontender, no hepatomegaly Ext: trace edema Skin: warm and dry Neuro: no focal abnormalities noted  EKG:  None today     ASSESSMENT AND PLAN:  1. Hypertension-currently well controlled on multidrug regimen. Since blood pressures have been lower and she's had some symptoms of dizziness occasionally, Bradycardia-on 07/16/14 her metoprolol was decreased to 12.5 mg a day because of sinus bradycardia rate 52. She was mildly symptomatic with fatigue. Went back 1/2 of lisinopril (20mg ) because weight gain. No more dizziness. She was told that the amlodipine would help keep her blood sugars low. 2. Obesity-continue to improve weight loss.  She joined [employer]. As weight decreases, her blood pressure medication may decrease as well. 3. 12 month f/u.  Signed, Candee Furbish, MD [facility]  01/29/2015 9:21 AM

## 2015-01-29 NOTE — Patient Instructions (Signed)
The current medical regimen is effective;  continue present plan and medications.  Follow up in 1 year with Dr. Skains.  You will receive a letter in the mail 2 months before you are due.  Please call us when you receive this letter to schedule your follow up appointment.  Thank you for choosing [facility]!!     

## 2015-03-03 ENCOUNTER — Other Ambulatory Visit: Payer: Self-pay | Admitting: Neurosurgery

## 2015-03-11 ENCOUNTER — Encounter (HOSPITAL_COMMUNITY): Payer: Self-pay

## 2015-03-11 ENCOUNTER — Encounter (HOSPITAL_COMMUNITY)
Admission: RE | Admit: 2015-03-11 | Discharge: 2015-03-11 | Disposition: A | Discharge status: Home / Self Care | Payer: Medicare Other | Source: Ambulatory Visit | Attending: Neurosurgery | Admitting: Neurosurgery

## 2015-03-11 DIAGNOSIS — Z01812 Encounter for preprocedural laboratory examination: Secondary | ICD-10-CM | POA: Diagnosis not present

## 2015-03-11 DIAGNOSIS — M5412 Radiculopathy, cervical region: Secondary | ICD-10-CM | POA: Insufficient documentation

## 2015-03-11 HISTORY — DX: Zoster without complications: B02.9

## 2015-03-11 HISTORY — DX: Adverse effect of unspecified anesthetic, initial encounter: T41.45XA

## 2015-03-11 HISTORY — DX: Other complications of anesthesia, initial encounter: T88.59XA

## 2015-03-11 LAB — BASIC METABOLIC PANEL
Anion gap: 8 (ref 5–15)
BUN: 22 mg/dL (ref 6–23)
CO2: 21 mmol/L (ref 19–32)
CREATININE: 1.53 mg/dL — AB (ref 0.50–1.10)
Calcium: 9 mg/dL (ref 8.4–10.5)
Chloride: 111 mmol/L (ref 96–112)
GFR calc Af Amer: 40 mL/min — ABNORMAL LOW (ref >90)
GFR calc non Af Amer: 34 mL/min — ABNORMAL LOW (ref >90)
GLUCOSE: 124 mg/dL — AB (ref 70–99)
POTASSIUM: 4.5 mmol/L (ref 3.5–5.1)
Sodium: 140 mmol/L (ref 135–145)

## 2015-03-11 LAB — SURGICAL PCR SCREEN
MRSA, PCR: NEGATIVE
STAPHYLOCOCCUS AUREUS: NEGATIVE

## 2015-03-11 LAB — CBC
HCT: 32.6 % — ABNORMAL LOW (ref 36.0–46.0)
Hemoglobin: 10.5 g/dL — ABNORMAL LOW (ref 12.0–15.0)
MCH: 28.5 pg (ref 26.0–34.0)
MCHC: 32.2 g/dL (ref 30.0–36.0)
MCV: 88.3 fL (ref 78.0–100.0)
Platelets: 238 10*3/uL (ref 150–400)
RBC: 3.69 MIL/uL — ABNORMAL LOW (ref 3.87–5.11)
RDW: 13.2 % (ref 11.5–15.5)
WBC: 7.3 10*3/uL (ref 4.0–10.5)

## 2015-03-11 NOTE — Progress Notes (Signed)
Anesthesia Chart Review:  Pt is 69 year old female scheduled for C7-T1 posterior cervical fusion on 03/16/2015 with Dr. Saintclair Halsted.   PCP is Dr. Donald Prose. Cardiologist is Dr. Marlou Porch (sees pt for HTN).   PMH includes: HTN, DM, OSA, COPD, anemia, renal insufficiency. Former smoker. BMI 33.   Pt reported in PAT that she had 3 "mini strokes" in 2005, saw cardiologist and neurologist at that time for work up, "nothing" was found. Pt has not been back to neurologist.   Preoperative labs reviewed.  Cr 1.53. H/H 10.5/32.6.   EKG 07/16/2014: sinus bradycardia (52 bpm). Low voltage QRS.   By notes, had low risk nuclear stress test in 2009.   If no changes, I anticipate pt can proceed with surgery as scheduled.   Willeen Cass, FNP-BC [facility]/Anesthesiology Phone: [phone] 03/11/2015 3:48 PM

## 2015-03-11 NOTE — Progress Notes (Addendum)
Had initial sleep study back in 1999.  Retested in 10/2014 @ ? [place].  She does wear the CPAP and 'its self titrating' so not aware of settings. Had seen Dr. Rollene Fare back in 2005 for "3 mini strokes".  Saw a neurologist at that time. Stress test was done, and nothing definitive was found.  She has not been back to see either MD at this time.  Currently sees Dr. Marlou Porch, and she sees him, 'mainly for my blood pressure"    LOV was 1 month ago.  Currently no problems or concerns.

## 2015-03-11 NOTE — Pre-Procedure Instructions (Signed)
Angela [name]  03/11/2015   Your procedure is scheduled on:  Monday, Mar 16, 2015  Report to [facility] Admitting at 9:15 AM.( per MD)  Call this number if you have problems the morning of surgery: [phone]   Remember:   Do not eat food or drink liquids after midnight Sunday, Mar 15, 2015   Take these medicines the morning of surgery with A SIP OF WATER: amLODipine (NORVASC)  buPROPion (WELLBUTRIN XL), metoprolol succinate (TOPROL-XL), budesonide-formoterol (SYMBICORT) inhaler, fluticasone (FLONASE) nasal spray  DO NOT take any diabetic medication the morning of procedure such as metFORMIN (GLUCOPHAGE)   Stop taking Aspirin, vitamins and herbal medications. Do not take any NSAIDs ie: Ibuprofen, Advil, Naproxen or any medication containing Aspirin; stop now.   Do not wear jewelry, make-up or nail polish.  Do not wear lotions, powders, or perfumes. You may not wear deodorant.  Do not shave 48 hours prior to surgery.  Do not bring valuables to the hospital.  [facility] is not responsible for any belongings or valuables.               Contacts, dentures or bridgework may not be worn into surgery.  Leave suitcase in the car. After surgery it may be brought to your room.  For patients admitted to the hospital, discharge time is determined by your treatment team.               Patients discharged the day of surgery will not be allowed to drive home.  Name and phone number of your driver:   Special Instructions:  Special Instructions:Special Instructions: [facility] - Preparing for Surgery  Before surgery, you can play an important role.  Because skin is not sterile, your skin needs to be as free of germs as possible.  You can reduce the number of germs on you skin by washing with CHG (chlorahexidine gluconate) soap before surgery.  CHG is an antiseptic cleaner which kills germs and bonds with the skin to continue killing germs even after washing.  Please DO NOT use if you have  an allergy to CHG or antibacterial soaps.  If your skin becomes reddened/irritated stop using the CHG and inform your nurse when you arrive at Short Stay.  Do not shave (including legs and underarms) for at least 48 hours prior to the first CHG shower.  You may shave your face.  Please follow these instructions carefully:   1.  Shower with CHG Soap the night before surgery and the morning of Surgery.  2.  If you choose to wash your hair, wash your hair first as usual with your normal shampoo.  3.  After you shampoo, rinse your hair and body thoroughly to remove the Shampoo.  4.  Use CHG as you would any other liquid soap.  You can apply chg directly  to the skin and wash gently with scrungie or a clean washcloth.  5.  Apply the CHG Soap to your body ONLY FROM THE NECK DOWN.  Do not use on open wounds or open sores.  Avoid contact with your eyes, ears, mouth and genitals (private parts).  Wash genitals (private parts) with your normal soap.  6.  Wash thoroughly, paying special attention to the area where your surgery will be performed.  7.  Thoroughly rinse your body with warm water from the neck down.  8.  DO NOT shower/wash with your normal soap after using and rinsing off the CHG Soap.  9.  Pat yourself dry with a clean towel.            10.  Wear clean pajamas.            11.  Place clean sheets on your bed the night of your first shower and do not sleep with pets.  Day of Surgery  Do not apply any lotions/deodorants the morning of surgery.  Please wear clean clothes to the hospital/surgery center.   Please read over the following fact sheets that you were given: Pain Booklet, Coughing and Deep Breathing, MRSA Information and Surgical Site Infection Prevention

## 2015-03-13 ENCOUNTER — Other Ambulatory Visit: Payer: Self-pay | Admitting: Neurosurgery

## 2015-03-13 ENCOUNTER — Ambulatory Visit
Admission: RE | Admit: 2015-03-13 | Discharge: 2015-03-13 | Disposition: A | Discharge status: Home / Self Care | Payer: Medicare Other | Source: Ambulatory Visit | Attending: Neurosurgery | Admitting: Neurosurgery

## 2015-03-13 DIAGNOSIS — M5412 Radiculopathy, cervical region: Secondary | ICD-10-CM

## 2015-03-13 NOTE — Progress Notes (Signed)
Notified patient of new arrival time, instructed to arrive Monday 1045am 03/16/15.

## 2015-03-16 ENCOUNTER — Encounter (HOSPITAL_COMMUNITY)
Admission: RE | Disposition: A | Discharge status: Home / Self Care | Payer: Self-pay | Source: Ambulatory Visit | Attending: Neurosurgery

## 2015-03-16 ENCOUNTER — Inpatient Hospital Stay (HOSPITAL_COMMUNITY): Payer: Medicare Other | Admitting: Certified Registered Nurse Anesthetist

## 2015-03-16 ENCOUNTER — Inpatient Hospital Stay (HOSPITAL_COMMUNITY): Payer: Medicare Other | Admitting: Emergency Medicine

## 2015-03-16 ENCOUNTER — Inpatient Hospital Stay (HOSPITAL_COMMUNITY): Payer: Medicare Other

## 2015-03-16 ENCOUNTER — Encounter (HOSPITAL_COMMUNITY): Payer: Self-pay | Admitting: *Deleted

## 2015-03-16 ENCOUNTER — Other Ambulatory Visit: Payer: Self-pay | Admitting: Neurosurgery

## 2015-03-16 ENCOUNTER — Inpatient Hospital Stay (HOSPITAL_COMMUNITY)
Admission: RE | Admit: 2015-03-16 | Discharge: 2015-03-18 | DRG: 472 | Disposition: A | Discharge status: Home / Self Care | Payer: Medicare Other | Source: Ambulatory Visit | Attending: Neurosurgery | Admitting: Neurosurgery

## 2015-03-16 DIAGNOSIS — Z87891 Personal history of nicotine dependence: Secondary | ICD-10-CM | POA: Diagnosis not present

## 2015-03-16 DIAGNOSIS — M5412 Radiculopathy, cervical region: Secondary | ICD-10-CM

## 2015-03-16 DIAGNOSIS — Y831 Surgical operation with implant of artificial internal device as the cause of abnormal reaction of the patient, or of later complication, without mention of misadventure at the time of the procedure: Secondary | ICD-10-CM | POA: Diagnosis present

## 2015-03-16 DIAGNOSIS — Z7951 Long term (current) use of inhaled steroids: Secondary | ICD-10-CM

## 2015-03-16 DIAGNOSIS — N189 Chronic kidney disease, unspecified: Secondary | ICD-10-CM | POA: Diagnosis present

## 2015-03-16 DIAGNOSIS — M47812 Spondylosis without myelopathy or radiculopathy, cervical region: Secondary | ICD-10-CM | POA: Diagnosis present

## 2015-03-16 DIAGNOSIS — M5013 Cervical disc disorder with radiculopathy, cervicothoracic region: Secondary | ICD-10-CM | POA: Diagnosis present

## 2015-03-16 DIAGNOSIS — M797 Fibromyalgia: Secondary | ICD-10-CM | POA: Diagnosis present

## 2015-03-16 DIAGNOSIS — E119 Type 2 diabetes mellitus without complications: Secondary | ICD-10-CM | POA: Diagnosis present

## 2015-03-16 DIAGNOSIS — M4313 Spondylolisthesis, cervicothoracic region: Secondary | ICD-10-CM | POA: Diagnosis present

## 2015-03-16 DIAGNOSIS — M4804 Spinal stenosis, thoracic region: Secondary | ICD-10-CM | POA: Diagnosis present

## 2015-03-16 DIAGNOSIS — J449 Chronic obstructive pulmonary disease, unspecified: Secondary | ICD-10-CM | POA: Diagnosis present

## 2015-03-16 DIAGNOSIS — Z79899 Other long term (current) drug therapy: Secondary | ICD-10-CM

## 2015-03-16 DIAGNOSIS — I129 Hypertensive chronic kidney disease with stage 1 through stage 4 chronic kidney disease, or unspecified chronic kidney disease: Secondary | ICD-10-CM | POA: Diagnosis present

## 2015-03-16 DIAGNOSIS — G4730 Sleep apnea, unspecified: Secondary | ICD-10-CM | POA: Diagnosis present

## 2015-03-16 DIAGNOSIS — M960 Pseudarthrosis after fusion or arthrodesis: Secondary | ICD-10-CM | POA: Diagnosis present

## 2015-03-16 DIAGNOSIS — Z419 Encounter for procedure for purposes other than remedying health state, unspecified: Secondary | ICD-10-CM

## 2015-03-16 DIAGNOSIS — M79601 Pain in right arm: Secondary | ICD-10-CM | POA: Diagnosis present

## 2015-03-16 DIAGNOSIS — IMO0002 Reserved for concepts with insufficient information to code with codable children: Secondary | ICD-10-CM | POA: Diagnosis present

## 2015-03-16 HISTORY — PX: POSTERIOR CERVICAL FUSION/FORAMINOTOMY: SHX5038

## 2015-03-16 LAB — GLUCOSE, CAPILLARY
Glucose-Capillary: 103 mg/dL — ABNORMAL HIGH (ref 70–99)
Glucose-Capillary: 96 mg/dL (ref 70–99)
Glucose-Capillary: 164 mg/dL — ABNORMAL HIGH (ref 70–99)
Glucose-Capillary: 196 mg/dL — ABNORMAL HIGH (ref 70–99)

## 2015-03-16 LAB — ABO/RH: ABO/RH(D): A POS

## 2015-03-16 LAB — TYPE AND SCREEN
ABO/RH(D): A POS
Antibody Screen: NEGATIVE

## 2015-03-16 SURGERY — POSTERIOR CERVICAL FUSION/FORAMINOTOMY LEVEL 1
Anesthesia: General | Site: Spine Lumbar

## 2015-03-16 MED ORDER — ACETAMINOPHEN 650 MG RE SUPP: 650.0000 mg | RECTAL | Status: DC | PRN

## 2015-03-16 MED ORDER — FLUTICASONE PROPIONATE 50 MCG/ACT NA SUSP
2.0000 | Freq: Two times a day (BID) | NASAL | Status: DC
  Filled 2015-03-16 (×2): qty 16

## 2015-03-16 MED ORDER — LIDOCAINE HCL 4 % MT SOLN
OROMUCOSAL | Status: DC | PRN
  Administered 2015-03-16: 4 mL via TOPICAL

## 2015-03-16 MED ORDER — BUPIVACAINE HCL (PF) 0.25 % IJ SOLN
INTRAMUSCULAR | Status: DC | PRN
  Administered 2015-03-16: 9 mL

## 2015-03-16 MED ORDER — CEFAZOLIN SODIUM-DEXTROSE 2-3 GM-% IV SOLR
INTRAVENOUS | Status: DC | PRN
  Administered 2015-03-16: 2 g via INTRAVENOUS

## 2015-03-16 MED ORDER — LACTATED RINGERS IV SOLN
INTRAVENOUS | Status: DC
  Administered 2015-03-16: 15:00:00 via INTRAVENOUS
  Administered 2015-03-16: 50 mL/h via INTRAVENOUS

## 2015-03-16 MED ORDER — DEXAMETHASONE SODIUM PHOSPHATE 4 MG/ML IJ SOLN
INTRAMUSCULAR | Status: DC | PRN
Start: 2015-03-16 — End: 2015-03-16
  Administered 2015-03-16: 4 mg via INTRAVENOUS

## 2015-03-16 MED ORDER — ONDANSETRON HCL 4 MG/2ML IJ SOLN
4.0000 mg | Freq: Once | INTRAMUSCULAR | Status: AC | PRN
  Administered 2015-03-16: 4 mg via INTRAVENOUS

## 2015-03-16 MED ORDER — SODIUM CHLORIDE 0.9 % IV SOLN
250.0000 mL | INTRAVENOUS | Status: DC
  Administered 2015-03-16: 250 mL via INTRAVENOUS

## 2015-03-16 MED ORDER — CEFAZOLIN SODIUM-DEXTROSE 2-3 GM-% IV SOLR
2.0000 g | Freq: Three times a day (TID) | INTRAVENOUS | Status: DC
  Administered 2015-03-16 – 2015-03-18 (×5): 2 g via INTRAVENOUS
  Filled 2015-03-16 (×7): qty 50

## 2015-03-16 MED ORDER — LIDOCAINE-EPINEPHRINE 1 %-1:100000 IJ SOLN
INTRAMUSCULAR | Status: DC | PRN
  Administered 2015-03-16: 7 mL

## 2015-03-16 MED ORDER — BUDESONIDE-FORMOTEROL FUMARATE 160-4.5 MCG/ACT IN AERO
2.0000 | INHALATION_SPRAY | Freq: Two times a day (BID) | RESPIRATORY_TRACT | Status: DC
  Administered 2015-03-17 – 2015-03-18 (×2): 2 via RESPIRATORY_TRACT
  Filled 2015-03-16 (×3): qty 6

## 2015-03-16 MED ORDER — TRAMADOL HCL 50 MG PO TABS: 50.0000 mg | ORAL_TABLET | Freq: Every day | ORAL | Status: DC | PRN

## 2015-03-16 MED ORDER — CYCLOBENZAPRINE HCL 10 MG PO TABS
10.0000 mg | ORAL_TABLET | Freq: Three times a day (TID) | ORAL | Status: DC | PRN
  Administered 2015-03-17: 10 mg via ORAL
  Filled 2015-03-16: qty 1

## 2015-03-16 MED ORDER — HYDROMORPHONE HCL 1 MG/ML IJ SOLN
INTRAMUSCULAR | Status: AC
  Administered 2015-03-16: 0.5 mg via INTRAVENOUS
  Filled 2015-03-16: qty 1

## 2015-03-16 MED ORDER — GLYCOPYRROLATE 0.2 MG/ML IJ SOLN
INTRAMUSCULAR | Status: AC
  Filled 2015-03-16: qty 3

## 2015-03-16 MED ORDER — FENTANYL CITRATE (PF) 100 MCG/2ML IJ SOLN
INTRAMUSCULAR | Status: DC | PRN
  Administered 2015-03-16: 50 ug via INTRAVENOUS
  Administered 2015-03-16: 100 ug via INTRAVENOUS
  Administered 2015-03-16: 50 ug via INTRAVENOUS

## 2015-03-16 MED ORDER — MIDAZOLAM HCL 5 MG/5ML IJ SOLN
INTRAMUSCULAR | Status: DC | PRN
  Administered 2015-03-16: 2 mg via INTRAVENOUS

## 2015-03-16 MED ORDER — PROPOFOL 10 MG/ML IV BOLUS
INTRAVENOUS | Status: DC | PRN
Start: 2015-03-16 — End: 2015-03-16
  Administered 2015-03-16: 30 mg via INTRAVENOUS
  Administered 2015-03-16: 130 mg via INTRAVENOUS

## 2015-03-16 MED ORDER — HEMOSTATIC AGENTS (NO CHARGE) OPTIME
TOPICAL | Status: DC | PRN
  Administered 2015-03-16: 1 via TOPICAL

## 2015-03-16 MED ORDER — MIDAZOLAM HCL 2 MG/2ML IJ SOLN
INTRAMUSCULAR | Status: AC
  Filled 2015-03-16: qty 2

## 2015-03-16 MED ORDER — PROMETHAZINE HCL 25 MG/ML IJ SOLN
12.5000 mg | Freq: Four times a day (QID) | INTRAMUSCULAR | Status: DC | PRN
  Administered 2015-03-16: 12.5 mg via INTRAVENOUS
  Filled 2015-03-16: qty 1

## 2015-03-16 MED ORDER — LISINOPRIL 20 MG PO TABS
20.0000 mg | ORAL_TABLET | Freq: Every day | ORAL | Status: DC
  Administered 2015-03-17: 20 mg via ORAL
  Filled 2015-03-16 (×4): qty 1

## 2015-03-16 MED ORDER — LIDOCAINE HCL (CARDIAC) 20 MG/ML IV SOLN
INTRAVENOUS | Status: DC | PRN
  Administered 2015-03-16: 100 mg via INTRAVENOUS

## 2015-03-16 MED ORDER — BACITRACIN ZINC 500 UNIT/GM EX OINT
TOPICAL_OINTMENT | CUTANEOUS | Status: DC | PRN
  Administered 2015-03-16: 1 via TOPICAL

## 2015-03-16 MED ORDER — HYDROMORPHONE HCL 1 MG/ML IJ SOLN
0.5000 mg | INTRAMUSCULAR | Status: DC | PRN
  Administered 2015-03-16: 1 mg via INTRAVENOUS
  Filled 2015-03-16: qty 1

## 2015-03-16 MED ORDER — ONDANSETRON HCL 4 MG/2ML IJ SOLN
INTRAMUSCULAR | Status: AC
  Administered 2015-03-16: 4 mg via INTRAVENOUS
  Filled 2015-03-16: qty 2

## 2015-03-16 MED ORDER — GLYCOPYRROLATE 0.2 MG/ML IJ SOLN
INTRAMUSCULAR | Status: DC | PRN
Start: 2015-03-16 — End: 2015-03-16
  Administered 2015-03-16: .2 mg via INTRAVENOUS
  Administered 2015-03-16 (×2): .3 mg via INTRAVENOUS

## 2015-03-16 MED ORDER — ONDANSETRON HCL 4 MG/2ML IJ SOLN: 4.0000 mg | Freq: Once | INTRAMUSCULAR | Status: DC | PRN

## 2015-03-16 MED ORDER — ONDANSETRON HCL 4 MG/2ML IJ SOLN: 4.0000 mg | INTRAMUSCULAR | Status: DC | PRN

## 2015-03-16 MED ORDER — HYDROMORPHONE HCL 1 MG/ML IJ SOLN
0.2500 mg | INTRAMUSCULAR | Status: DC | PRN
  Administered 2015-03-16: 0.5 mg via INTRAVENOUS

## 2015-03-16 MED ORDER — METOPROLOL SUCCINATE 12.5 MG HALF TABLET
12.5000 mg | ORAL_TABLET | Freq: Every day | ORAL | Status: DC
  Administered 2015-03-17: 12.5 mg via ORAL
  Filled 2015-03-16 (×2): qty 1

## 2015-03-16 MED ORDER — ARTIFICIAL TEARS OP OINT
TOPICAL_OINTMENT | OPHTHALMIC | Status: DC | PRN
  Administered 2015-03-16: 1 via OPHTHALMIC

## 2015-03-16 MED ORDER — ROCURONIUM BROMIDE 50 MG/5ML IV SOLN
INTRAVENOUS | Status: AC
  Filled 2015-03-16: qty 2

## 2015-03-16 MED ORDER — METFORMIN HCL 500 MG PO TABS: 500.0000 mg | ORAL_TABLET | Freq: Two times a day (BID) | ORAL | Status: DC

## 2015-03-16 MED ORDER — AMLODIPINE BESYLATE 5 MG PO TABS
5.0000 mg | ORAL_TABLET | Freq: Every day | ORAL | Status: DC
  Administered 2015-03-17: 5 mg via ORAL
  Filled 2015-03-16 (×2): qty 1

## 2015-03-16 MED ORDER — SODIUM CHLORIDE 0.9 % IJ SOLN
3.0000 mL | Freq: Two times a day (BID) | INTRAMUSCULAR | Status: DC
  Administered 2015-03-16 – 2015-03-17 (×3): 3 mL via INTRAVENOUS

## 2015-03-16 MED ORDER — ONDANSETRON HCL 4 MG/2ML IJ SOLN
INTRAMUSCULAR | Status: DC | PRN
  Administered 2015-03-16 (×2): 4 mg via INTRAVENOUS

## 2015-03-16 MED ORDER — 0.9 % SODIUM CHLORIDE (POUR BTL) OPTIME
TOPICAL | Status: DC | PRN
  Administered 2015-03-16: 1000 mL

## 2015-03-16 MED ORDER — PHENOL 1.4 % MT LIQD
1.0000 | OROMUCOSAL | Status: DC | PRN
  Administered 2015-03-17: 1 via OROMUCOSAL
  Filled 2015-03-16: qty 177

## 2015-03-16 MED ORDER — NEOSTIGMINE METHYLSULFATE 10 MG/10ML IV SOLN
INTRAVENOUS | Status: AC
  Filled 2015-03-16: qty 1

## 2015-03-16 MED ORDER — BUPROPION HCL ER (XL) 150 MG PO TB24
150.0000 mg | ORAL_TABLET | Freq: Every day | ORAL | Status: DC
  Administered 2015-03-17: 150 mg via ORAL
  Filled 2015-03-16 (×2): qty 1

## 2015-03-16 MED ORDER — PHENYLEPHRINE HCL 10 MG/ML IJ SOLN
INTRAMUSCULAR | Status: DC | PRN
  Administered 2015-03-16 (×3): 40 ug via INTRAVENOUS
  Administered 2015-03-16: 80 ug via INTRAVENOUS
  Administered 2015-03-16 (×3): 40 ug via INTRAVENOUS

## 2015-03-16 MED ORDER — SODIUM CHLORIDE 0.9 % IR SOLN
Status: DC | PRN
  Administered 2015-03-16: 500 mL

## 2015-03-16 MED ORDER — EPHEDRINE SULFATE 50 MG/ML IJ SOLN
INTRAMUSCULAR | Status: DC | PRN
Start: 2015-03-16 — End: 2015-03-16
  Administered 2015-03-16 (×2): 5 mg via INTRAVENOUS
  Administered 2015-03-16: 10 mg via INTRAVENOUS

## 2015-03-16 MED ORDER — ALUM & MAG HYDROXIDE-SIMETH 200-200-20 MG/5ML PO SUSP: 30.0000 mL | Freq: Four times a day (QID) | ORAL | Status: DC | PRN

## 2015-03-16 MED ORDER — ACETAMINOPHEN 325 MG PO TABS: 650.0000 mg | ORAL_TABLET | ORAL | Status: DC | PRN

## 2015-03-16 MED ORDER — PHENYLEPHRINE HCL 10 MG/ML IJ SOLN
10.0000 mg | INTRAMUSCULAR | Status: DC | PRN
  Administered 2015-03-16: 10 ug/min via INTRAVENOUS

## 2015-03-16 MED ORDER — ATORVASTATIN CALCIUM 40 MG PO TABS
40.0000 mg | ORAL_TABLET | Freq: Every day | ORAL | Status: DC
  Administered 2015-03-17: 40 mg via ORAL
  Filled 2015-03-16 (×2): qty 1

## 2015-03-16 MED ORDER — OXYCODONE-ACETAMINOPHEN 5-325 MG PO TABS
1.0000 | ORAL_TABLET | ORAL | Status: DC | PRN
  Administered 2015-03-17 – 2015-03-18 (×5): 2 via ORAL
  Filled 2015-03-16 (×6): qty 2

## 2015-03-16 MED ORDER — SODIUM CHLORIDE 0.9 % IJ SOLN: 3.0000 mL | INTRAMUSCULAR | Status: DC | PRN

## 2015-03-16 MED ORDER — MENTHOL 3 MG MT LOZG: 1.0000 | LOZENGE | OROMUCOSAL | Status: DC | PRN

## 2015-03-16 MED ORDER — ROCURONIUM BROMIDE 100 MG/10ML IV SOLN
INTRAVENOUS | Status: DC | PRN
  Administered 2015-03-16: 5 mg via INTRAVENOUS
  Administered 2015-03-16: 10 mg via INTRAVENOUS
  Administered 2015-03-16: 50 mg via INTRAVENOUS
  Administered 2015-03-16 (×2): 5 mg via INTRAVENOUS

## 2015-03-16 MED ORDER — THROMBIN 5000 UNITS EX SOLR
CUTANEOUS | Status: DC | PRN
  Administered 2015-03-16 (×4): 5000 [IU] via TOPICAL

## 2015-03-16 MED ORDER — FENTANYL CITRATE (PF) 250 MCG/5ML IJ SOLN
INTRAMUSCULAR | Status: AC
  Filled 2015-03-16: qty 5

## 2015-03-16 MED ORDER — HYDROCHLOROTHIAZIDE 25 MG PO TABS
25.0000 mg | ORAL_TABLET | Freq: Every day | ORAL | Status: DC
  Administered 2015-03-17: 25 mg via ORAL
  Filled 2015-03-16 (×3): qty 1

## 2015-03-16 MED ORDER — GLYCOPYRROLATE 0.2 MG/ML IJ SOLN
INTRAMUSCULAR | Status: AC
  Filled 2015-03-16: qty 4

## 2015-03-16 MED ORDER — NEOSTIGMINE METHYLSULFATE 10 MG/10ML IV SOLN
INTRAVENOUS | Status: DC | PRN
  Administered 2015-03-16 (×2): 2 mg via INTRAVENOUS

## 2015-03-16 SURGICAL SUPPLY — 74 items
APL SKNCLS STERI-STRIP NONHPOA (GAUZE/BANDAGES/DRESSINGS) ×1
BAG DECANTER FOR FLEXI CONT (MISCELLANEOUS) ×3 IMPLANT
BENZOIN TINCTURE PRP APPL 2/3 (GAUZE/BANDAGES/DRESSINGS) ×4 IMPLANT
BIT DRILL 2.4XNS REUSE 3.5X (BIT) IMPLANT
BIT DRL 2.4XNS REUSE 3.5X (BIT) ×1
BLADE CLIPPER SURG (BLADE) ×3 IMPLANT
BLADE SURG 11 STRL SS (BLADE) ×3 IMPLANT
BONE ALLOSTEM MORSELIZED 5CC (Bone Implant) ×2 IMPLANT
BUR MATCHSTICK NEURO 3.0 LAGG (BURR) ×3 IMPLANT
CANISTER SUCT 3000ML PPV (MISCELLANEOUS) ×3 IMPLANT
CAP ELLIPSE LOCKING (Cap) ×12 IMPLANT
CLOSURE WOUND 1/2 X4 (GAUZE/BANDAGES/DRESSINGS) ×2
CONT SPEC 4OZ CLIKSEAL STRL BL (MISCELLANEOUS) ×3 IMPLANT
DECANTER SPIKE VIAL GLASS SM (MISCELLANEOUS) ×3 IMPLANT
DRAPE C-ARM 42X72 X-RAY (DRAPES) ×6 IMPLANT
DRAPE LAPAROTOMY 100X72 PEDS (DRAPES) ×3 IMPLANT
DRAPE MICROSCOPE LEICA (MISCELLANEOUS) ×2 IMPLANT
DRAPE POUCH INSTRU U-SHP 10X18 (DRAPES) ×3 IMPLANT
DRAPE SURG 17X23 STRL (DRAPES) ×10 IMPLANT
DRILL BIT 3.5 (BIT) ×3
DRSG OPSITE 4X5.5 SM (GAUZE/BANDAGES/DRESSINGS) ×4 IMPLANT
DRSG OPSITE POSTOP 4X6 (GAUZE/BANDAGES/DRESSINGS) ×2 IMPLANT
DURAPREP 26ML APPLICATOR (WOUND CARE) ×3 IMPLANT
ELECT REM PT RETURN 9FT ADLT (ELECTROSURGICAL) ×3
ELECTRODE REM PT RTRN 9FT ADLT (ELECTROSURGICAL) ×1 IMPLANT
GAUZE SPONGE 4X4 12PLY STRL (GAUZE/BANDAGES/DRESSINGS) ×3 IMPLANT
GAUZE SPONGE 4X4 16PLY XRAY LF (GAUZE/BANDAGES/DRESSINGS) IMPLANT
GLOVE BIO SURGEON STRL SZ8 (GLOVE) ×5 IMPLANT
GLOVE BIOGEL PI IND STRL 7.5 (GLOVE) IMPLANT
GLOVE BIOGEL PI IND STRL 8 (GLOVE) IMPLANT
GLOVE BIOGEL PI INDICATOR 7.5 (GLOVE) ×4
GLOVE BIOGEL PI INDICATOR 8 (GLOVE) ×6
GLOVE ECLIPSE 7.5 STRL STRAW (GLOVE) ×12 IMPLANT
GLOVE EXAM NITRILE LRG STRL (GLOVE) IMPLANT
GLOVE EXAM NITRILE MD LF STRL (GLOVE) IMPLANT
GLOVE EXAM NITRILE XL STR (GLOVE) IMPLANT
GLOVE EXAM NITRILE XS STR PU (GLOVE) IMPLANT
GLOVE INDICATOR 8.5 STRL (GLOVE) ×5 IMPLANT
GLOVE SURG SS PI 7.0 STRL IVOR (GLOVE) ×4 IMPLANT
GOWN STRL REUS W/ TWL LRG LVL3 (GOWN DISPOSABLE) IMPLANT
GOWN STRL REUS W/ TWL XL LVL3 (GOWN DISPOSABLE) ×1 IMPLANT
GOWN STRL REUS W/TWL 2XL LVL3 (GOWN DISPOSABLE) ×4 IMPLANT
GOWN STRL REUS W/TWL LRG LVL3 (GOWN DISPOSABLE)
GOWN STRL REUS W/TWL XL LVL3 (GOWN DISPOSABLE) ×15
KIT BASIN OR (CUSTOM PROCEDURE TRAY) ×3 IMPLANT
KIT ROOM TURNOVER OR (KITS) ×3 IMPLANT
LIQUID BAND (GAUZE/BANDAGES/DRESSINGS) ×3 IMPLANT
MARKER SKIN DUAL TIP RULER LAB (MISCELLANEOUS) ×3 IMPLANT
NDL HYPO 25X1 1.5 SAFETY (NEEDLE) ×1 IMPLANT
NDL SPNL 20GX3.5 QUINCKE YW (NEEDLE) ×1 IMPLANT
NEEDLE HYPO 25X1 1.5 SAFETY (NEEDLE) ×3 IMPLANT
NEEDLE SPNL 20GX3.5 QUINCKE YW (NEEDLE) ×3 IMPLANT
NS IRRIG 1000ML POUR BTL (IV SOLUTION) ×3 IMPLANT
PACK LAMINECTOMY NEURO (CUSTOM PROCEDURE TRAY) ×3 IMPLANT
PAD ARMBOARD 7.5X6 YLW CONV (MISCELLANEOUS) ×11 IMPLANT
PIN MAYFIELD SKULL DISP (PIN) ×3 IMPLANT
PUTTY BONE DBX 5CC MIX (Putty) ×2 IMPLANT
ROD ELLIPSE 40MMX3.5 (Rod) ×4 IMPLANT
RUBBERBAND STERILE (MISCELLANEOUS) ×4 IMPLANT
SCREW 3.5X15 (Screw) ×6 IMPLANT
SCREW ELLIPSE 14X3.5MM (Screw) ×6 IMPLANT
SPONGE LAP 4X18 X RAY DECT (DISPOSABLE) IMPLANT
SPONGE SURGIFOAM ABS GEL 100 (HEMOSTASIS) ×3 IMPLANT
STRIP CLOSURE SKIN 1/2X4 (GAUZE/BANDAGES/DRESSINGS) ×3 IMPLANT
SUT ETHILON 4 0 PS 2 18 (SUTURE) IMPLANT
SUT VIC AB 0 CT1 18XCR BRD8 (SUTURE) ×1 IMPLANT
SUT VIC AB 0 CT1 8-18 (SUTURE) ×3
SUT VIC AB 2-0 CT1 18 (SUTURE) ×3 IMPLANT
SUT VICRYL 4-0 PS2 18IN ABS (SUTURE) ×3 IMPLANT
SYR 20ML ECCENTRIC (SYRINGE) ×3 IMPLANT
TOWEL OR 17X24 6PK STRL BLUE (TOWEL DISPOSABLE) ×3 IMPLANT
TOWEL OR 17X26 10 PK STRL BLUE (TOWEL DISPOSABLE) ×3 IMPLANT
TRAY FOLEY CATH 14FRSI W/METER (CATHETERS) ×2 IMPLANT
WATER STERILE IRR 1000ML POUR (IV SOLUTION) ×3 IMPLANT

## 2015-03-16 NOTE — Anesthesia Procedure Notes (Signed)
Procedure Name: Intubation Date/Time: 03/16/2015 2:33 PM Performed by: Merdis Delay Pre-anesthesia Checklist: Patient identified, Timeout performed, Emergency Drugs available, Suction available and Patient being monitored Patient Re-evaluated:Patient Re-evaluated prior to inductionOxygen Delivery Method: Circle system utilized Preoxygenation: Pre-oxygenation with 100% oxygen Intubation Type: IV induction Ventilation: Mask ventilation without difficulty and Oral airway inserted - appropriate to patient size Grade View: Grade I Tube size: 7.5 mm Number of attempts: 1 Airway Equipment and Method: LTA kit utilized and Video-laryngoscopy Placement Confirmation: ETT inserted through vocal cords under direct vision,  breath sounds checked- equal and bilateral,  positive ETCO2 and CO2 detector Secured at: 22 cm Tube secured with: Tape Dental Injury: Teeth and Oropharynx as per pre-operative assessment  Comments: Limited neck mobility 2/2 cervical radiculopathy and previous surgical surgical procedures

## 2015-03-16 NOTE — Plan of Care (Signed)
Problem: Consults Goal: Diagnosis - Spinal Surgery Outcome: Completed/Met Date Met:  03/16/15 Cervical Spine Fusion

## 2015-03-16 NOTE — Transfer of Care (Signed)
Immediate Anesthesia Transfer of Care Note  Patient: Angela [name]  Procedure(s) Performed: Procedure(s) with comments: POSTERIOR CERVICAL LAMINECTOMY AND DISKECTOMOY AT CERVICAL SEVEN-THORACIC ONE ;POSTERIOR CERVICAL FUSION CERVICAL SIX TO THORACIC ONE (N/A) - Site is Cervical/Thoracic  Patient Location: PACU  Anesthesia Type:General  Level of Consciousness: awake, alert  and oriented  Airway & Oxygen Therapy: Patient Spontanous Breathing and Patient connected to nasal cannula oxygen  Post-op Assessment: Report given to RN and Post -op Vital signs reviewed and stable  Post vital signs: Reviewed and stable  Last Vitals:  Filed Vitals:   03/16/15 1722  BP:   Pulse:   Temp: 36.9 C  Resp:     Complications: No apparent anesthesia complications

## 2015-03-16 NOTE — Anesthesia Preprocedure Evaluation (Signed)
Anesthesia Evaluation  Patient identified by MRN, date of birth, ID band Patient awake    Reviewed: Allergy & Precautions, NPO status , Patient's Chart, lab work & pertinent test results  Airway Mallampati: I  TM Distance: >3 FB Neck ROM: Full    Dental   Pulmonary sleep apnea , COPDformer smoker,    Pulmonary exam normal       Cardiovascular hypertension, Pt. on medications     Neuro/Psych    GI/Hepatic   Endo/Other  diabetes  Renal/GU      Musculoskeletal   Abdominal   Peds  Hematology   Anesthesia Other Findings   Reproductive/Obstetrics                             Anesthesia Physical Anesthesia Plan  ASA: II  Anesthesia Plan: General   Post-op Pain Management:    Induction: Intravenous  Airway Management Planned: Oral ETT  Additional Equipment:   Intra-op Plan:   Post-operative Plan: Extubation in OR  Informed Consent: I have reviewed the patients History and Physical, chart, labs and discussed the procedure including the risks, benefits and alternatives for the proposed anesthesia with the patient or authorized representative who has indicated his/her understanding and acceptance.     Plan Discussed with: CRNA and Surgeon  Anesthesia Plan Comments:         Anesthesia Quick Evaluation

## 2015-03-16 NOTE — Op Note (Signed)
Preoperative diagnosis: Grade 1 spondylolisthesis cervical spondylosis stenosis right C8 radiculopathy from herniated nucleus pulposus C7-T1.  #2 pseudoarthrosis C6-7  Procedure: Posterior cervical laminectomy and foraminotomies at C7-T1 of the C8 nerve root bilaterally and a C7-T1 microdiscectomy on the patient's right side with microdissection of the right C8 nerve root microscopic discectomy  #2 posterior cervical fusion with lateral mass screws and pedicle screws from C6-T1 with lateral mass screws and C6 and on the left C7 and pedicle screws on the right at C7 and bilaterally at T1. Utilizing the globus ellipse posterior cervical fusions set  #3 posterior lateral arthrodesis C6-T1 using locally harvested autograft mixed with DBX mix and allostem morsels  Surgeon: Dominica Severin Fountain Derusha  Asst.: Jovita Gamma  Anesthesia: Gen.  EBL: Minimal  History of present illness: Patient is a very pleasant 69 year female is a progress worsening neck pain bilateral shoulder and arm pain but worse on the right with weakness in her right hand. Workup revealed a spinal listhesis C7-T1 with a large spur and disc displacing the right C8 nerve root. In addition subsequent CT scan showed a pseudoarthrosis at C6-7. Due the patient's failure conservative treatment imaging findings and progressive clinical syndrome I recommended decompressive cervical laminectomy foraminotomies discectomy at C7-T1 as well as posterior cervical fusion at C6-7 for pseudoarthrosis and C7-T1. I extensively went over the risks and benefits of the operation with the patient as well as perioperative course expectations of outcome and alternatives of surgery and she understands and agrees to proceed forward.  Operative procedure: Patient brought into the or was induced under general anesthesia positioned prone in pins back side of her neck was prepped and draped in routine sterile fashion after infiltration of 10 mL lidocaine with epi a midline  incision was made and Bovie light cautery was used to gas of the suture and subperiosteal dissection carried lamina of C6-C7 and T1 bilaterally. Interoperative x-ray confirmed the location appropriate level so first intense taken the lateral mass screw placement at C6 C using inferomedial quadrant extending out the superolateral quadrant 2 holes were drilled 14 mm deep and 14 mm screws are placed with excellent purchase. In a similar fashion a lateral mass screws placed at C7 also with excellent purchase. At this point on the right side and performed a posterior cervical laminectomy and meshed of illumination did a radical foraminotomies C8 nerve root. Identified the disc space there was several large times a disc that I removed teased out with a nerve hook a micro-pituitaries. At the discectomy was no further stenosis on the C8 nerve root I was able to clearly visualize and palpate both the C7 and T1 pedicles. I suspect also performed a foraminotomy of the left C8 nerve root and identified the left T1 pedicle. Then with C-arm fluoroscopic guidance I placed pedicle screws with 16 mm screws at C7 and T1 all screws excellent purchase the then copiously irrigated wound aggressively decorticated the lateral masses and facet joints at C6-7 and C7-T1 packed the facet joints and lateral masses with the autograft mixed and then placed rods and tightened the nuts down anchoring in place construct. Placed a medium Hemovac drain and closed the wound in layers with Vicryl and a running 4 subcuticular. Dermabond benzo and Steri-Strips applied patient recovered in stable condition. At the [facility] and counts sponge counts were correct.

## 2015-03-16 NOTE — Anesthesia Postprocedure Evaluation (Signed)
  Anesthesia Post-op Note  Patient: Angela [name]  Procedure(s) Performed: Procedure(s) with comments: POSTERIOR CERVICAL LAMINECTOMY AND DISKECTOMOY AT CERVICAL SEVEN-THORACIC ONE ;POSTERIOR CERVICAL FUSION CERVICAL SIX TO THORACIC ONE (N/A) - Site is Cervical/Thoracic  Patient Location: PACU  Anesthesia Type:General  Level of Consciousness: awake, alert  and oriented  Airway and Oxygen Therapy: Patient Spontanous Breathing and Patient connected to nasal cannula oxygen  Post-op Pain: mild  Post-op Assessment: Post-op Vital signs reviewed, Patient's Cardiovascular Status Stable, Respiratory Function Stable, Patent Airway and Pain level controlled  Post-op Vital Signs: stable  Last Vitals:  Filed Vitals:   03/16/15 1905  BP: 142/57  Pulse: 76  Temp: 36.7 C  Resp: 16    Complications: No apparent anesthesia complications

## 2015-03-16 NOTE — H&P (Signed)
Angela [name] is an 69 y.o. female.   Chief Complaint: Neck and right arm pain HPI: Patient is a 69 year old female with several months of progressive worsening neck and right arm pain with weakness in the right hand. Workup revealed a large disc herniation and spondylosis C6 C7 T1 below previous C5-C7 fusion. Due to her failure conservative treatment imaging findings and progression of clinical syndrome I recommended a posterior cervical laminectomy foraminotomy and discectomy at C7-T1 on the right and due the fact there is a baseline spondylolisthesis and previous C5-C7 fusion I recommended on maintaining this with posterior cervical fusion with lateral mass screws in C7 pedicle screw and T1 subsequent CT scan preoperatively also revealed a pseudoarthrosis at C6-7 so I will extend the fusion up and put screws in C6 and posterior lateral bone in the lateral masses at C6-7 as well. I've extensively gone over the risks and benefits of this operation with the patient as well as perioperative course expectations of outcome and alternatives surgery and she understands and agrees to proceed forward.  Past Medical History  Diagnosis Date  . Diabetes mellitus   . Hypertension   . Depression     with anxiety  . DDD (degenerative disc disease)   . Fibromyalgia   . COPD (chronic obstructive pulmonary disease)   . Sleep apnea     wears CPAP  . Diverticulitis   . Diverticulosis   . Esophageal dysmotility 03/01/06    mild  . Anemia 2014    mild  . Complication of anesthesia     passes out 24 hrs after getting  . Chronic kidney disease     early stages of some insufficiency  . Shingles     Past Surgical History  Procedure Laterality Date  . Appendectomy    . Tonsillectomy    . Gastric fundoplication    . Back surgery    . Hernia repair    . Knee arthroscopy      left   . De quervain's release      right  . Anterior fusion cervical spine    . Abdominal hysterectomy      Family History   Problem Relation Age of Onset  . COPD Father   . Colon cancer Maternal Aunt   . Stomach cancer Maternal Grandfather    Social History:  reports that she has quit smoking. Her smoking use included Cigarettes. She has a 22 pack-year smoking history. She has never used smokeless tobacco. She reports that she does not drink alcohol or use illicit drugs.  Allergies:  Allergies  Allergen Reactions  . Codeine Nausea And Vomiting  . Iohexol      Desc: Pt states several years ago during a CT scan w/ iv cotnrast she had severe nausea and vomiting, sob w/ difficulty breathing and swallowing.  She had full 13hr. premeds today (5/14/7) and did fine w/o complications.   . Ciprofloxacin Rash    Medications Prior to Admission  Medication Sig Dispense Refill  . ACCU-CHEK AVIVA PLUS test strip   1  . amLODipine (NORVASC) 5 MG tablet Take 5 mg by mouth daily.   1  . atorvastatin (LIPITOR) 40 MG tablet Take 40 mg by mouth daily at 6 PM.   2  . B Complex-C (SUPER B-C PO) Take 1 tablet by mouth daily at 12 noon.     . budesonide-formoterol (SYMBICORT) 160-4.5 MCG/ACT inhaler Inhale 2 puffs into the lungs 2 (two) times daily.    Marland Kitchen  buPROPion (WELLBUTRIN XL) 150 MG 24 hr tablet Take 150 mg by mouth daily.     . fluticasone (FLONASE) 50 MCG/ACT nasal spray Place 2 sprays into both nostrils 2 (two) times daily.   4  . hydrochlorothiazide (HYDRODIURIL) 25 MG tablet Take 25 mg by mouth daily.     Marland Kitchen ibuprofen (ADVIL,MOTRIN) 200 MG tablet Take 200 mg by mouth every 6 (six) hours as needed for mild pain or moderate pain.    Marland Kitchen lisinopril (PRINIVIL,ZESTRIL) 20 MG tablet Take 20 mg by mouth daily.    . metFORMIN (GLUCOPHAGE) 500 MG tablet Take 500 mg by mouth 2 (two) times daily with a meal.    . metoprolol succinate (TOPROL-XL) 25 MG 24 hr tablet Take 0.5 tablets (12.5 mg total) by mouth daily. 90 tablet 2  . Probiotic Product (PROBIOTIC DAILY PO) Take 1 tablet by mouth daily at 12 noon.     . Promethazine HCl  (PHENERGAN PO) Take 1 tablet by mouth 3 (three) times daily as needed (nausea).    . traMADol (ULTRAM) 50 MG tablet Take 50 mg by mouth daily as needed for moderate pain or severe pain.   0    Results for orders placed or performed during the hospital encounter of 03/16/15 (from the past 48 hour(s))  Glucose, capillary     Status: Abnormal   Collection Time: 03/16/15 10:36 AM  Result Value Ref Range   Glucose-Capillary 103 (H) 70 - 99 mg/dL  Type and screen     Status: None   Collection Time: 03/16/15 10:38 AM  Result Value Ref Range   ABO/RH(D) A POS    Antibody Screen NEG    Sample Expiration 03/19/2015   Glucose, capillary     Status: None   Collection Time: 03/16/15 12:33 PM  Result Value Ref Range   Glucose-Capillary 96 70 - 99 mg/dL   No results found.  Review of Systems  Constitutional: Negative.   Eyes: Negative.   Respiratory: Negative.   Cardiovascular: Negative.   Gastrointestinal: Negative.   Genitourinary: Negative.   Musculoskeletal: Positive for myalgias, joint pain and neck pain.  Skin: Negative.   Neurological: Positive for tingling and sensory change.  Psychiatric/Behavioral: Negative.     Blood pressure 160/62, pulse 67, temperature 97.9 F (36.6 C), temperature source Oral, resp. rate 20, height 5' 7.5" (1.715 m), weight 96.163 kg (212 lb), SpO2 99 %. Physical Exam  Constitutional: She is oriented to person, place, and time. She appears well-developed and well-nourished.  HENT:  Head: Normocephalic.  Eyes: Pupils are equal, round, and reactive to light.  Neck: Normal range of motion.  Respiratory: Effort normal.  GI: Soft.  Neurological: She is alert and oriented to person, place, and time. She has normal strength. GCS eye subscore is 4. GCS verbal subscore is 5. GCS motor subscore is 6.  Strength is 5 out of 5 in her deltoid, bicep, tricep, wrist flexion, wrist extension, hand intrinsics on the left are 5 out of 5 and intrinsics on the right are 4  out of 5.  Skin: Skin is warm and dry.     Assessment/Plan 69 year old female with cervical spondylosis stenosis and a grade 1 spondylo- listhesis C7-T1 as well as a pseudoarthrosis C6-7 presents for a C6-T1 fusion foraminotomy discectomy at C7-T1.  Kamali Nephew P 03/16/2015, 1:47 PM

## 2015-03-16 NOTE — Progress Notes (Signed)
Placed patient on CPAP home settings auto max 20, min 10, with 2L 02 bleed in via patient's home nasal pillows.  Patient is tolerating well at this time.

## 2015-03-16 NOTE — Progress Notes (Signed)
PHARMACIST - PHYSICIAN COMMUNICATION   CONCERNING:  METFORMIN SAFE ADMINISTRATION POLICY  RECOMMENDATION: Metformin has been placed on DISCONTINUE (rejected order) STATUS and should be reordered only after any of the conditions below are ruled out.  Current safety recommendations include avoiding metformin for a minimum of 48 hours after the patient's exposure to intravenous contrast media.  DESCRIPTION:  The Pharmacy Committee has adopted a policy that restricts the use of metformin in hospitalized patients until all the contraindications to administration have been ruled out. Specific contraindications are: []  Serum creatinine ? 1.5 for males [x]  Serum creatinine ? 1.4 for females []  Shock, acute MI, sepsis, hypoxemia, dehydration []  Planned administration of intravenous iodinated contrast media []  Heart Failure patients with low EF []  Acute or chronic metabolic acidosis (including DKA)     Harvel Quale 03/16/2015 7:24 PM

## 2015-03-17 ENCOUNTER — Encounter (HOSPITAL_COMMUNITY): Payer: Self-pay | Admitting: Neurosurgery

## 2015-03-17 LAB — GLUCOSE, CAPILLARY
GLUCOSE-CAPILLARY: 166 mg/dL — AB (ref 70–99)
GLUCOSE-CAPILLARY: 147 mg/dL — AB (ref 70–99)
Glucose-Capillary: 130 mg/dL — ABNORMAL HIGH (ref 70–99)
Glucose-Capillary: 138 mg/dL — ABNORMAL HIGH (ref 70–99)

## 2015-03-17 MED ORDER — PROMETHAZINE HCL 25 MG PO TABS
25.0000 mg | ORAL_TABLET | Freq: Four times a day (QID) | ORAL | Status: DC | PRN
  Administered 2015-03-17 – 2015-03-18 (×5): 25 mg via ORAL
  Filled 2015-03-17 (×5): qty 1

## 2015-03-17 NOTE — Evaluation (Signed)
Occupational Therapy Evaluation Patient Details Name: Angela [name] MRN: [id number] DOB: 08/09/1946 Today's Date: 03/17/2015    History of Present Illness 69 y.o. s/p POSTERIOR CERVICAL LAMINECTOMY AND DISKECTOMOY AT CERVICAL SEVEN-THORACIC ONE ;POSTERIOR CERVICAL FUSION CERVICAL SIX TO THORACIC ONE  Pt with previous back surgery.   Clinical Impression   Pt s/p above. Pt independent with ADLs, PTA. Feel pt will benefit from acute OT to address bilateral fine motor coordination and reinforce precautions prior to d/c.     Follow Up Recommendations  No OT follow up;Supervision - Intermittent    Equipment Recommendations  None recommended by OT    Recommendations for Other Services       Precautions / Restrictions Precautions Precautions: Cervical Precaution Comments: educated on cervical precautions Required Braces or Orthoses: Cervical Brace Cervical Brace: Soft collar Restrictions Weight Bearing Restrictions: No      Mobility Bed Mobility Overal bed mobility: Needs Assistance Bed Mobility: Rolling;Sidelying to Sit;Sit to Sidelying Rolling: Supervision Sidelying to sit: Modified independent (Device/Increase time)     Sit to sidelying: Modified independent (Device/Increase time)    Transfers Overall transfer level: Modified independent                    Balance    No LOB in session-balance not formally assessed.                                        ADL Overall ADL's : Needs assistance/impaired                 Upper Body Dressing : Set up;Supervision/safety;Sitting   Lower Body Dressing: Supervision/safety;Set up;Sit to/from stand   Toilet Transfer: Supervision/safety;Ambulation;Regular Toilet   Toileting- Water quality scientist and Hygiene: Supervision/safety (sitting/standing)       Functional mobility during ADLs: Supervision/safety General ADL Comments: Educated on cervical collar. Educated on safety such as safe  footwear and sitting for LB bathing. Discussed incorporating precautions into functional activities. Suggested button up shirt for UB clothing. Pt says she plans to sponge bathe initially. Educated on LB dressing technique. Cues for precautions in session.     Vision     Perception     Praxis      Pertinent Vitals/Pain Pain Assessment: 0-10 Pain Score: 8  Pain Location: upper back Pain Intervention(s): Monitored during session;Repositioned     Hand Dominance Right   Extremity/Trunk Assessment Upper Extremity Assessment Upper Extremity Assessment: RUE deficits/detail;LUE deficits/detail RUE Deficits / Details: painful with shoulder flexion RUE Sensation: decreased light touch LUE Deficits / Details: painful with shoulder flexion LUE Sensation: decreased light touch   Lower Extremity Assessment Lower Extremity Assessment: Overall WFL for tasks assessed       Communication Communication Communication: No difficulties   Cognition Arousal/Alertness: Awake/alert Behavior During Therapy: WFL for tasks assessed/performed Overall Cognitive Status: Within Functional Limits for tasks assessed                     General Comments       Exercises Exercises: Other exercises Other Exercises Other Exercises: educated on fine motor coordination exercise/activity for bilateral hands   Shoulder Instructions      Home Living Family/patient expects to be discharged to:: Private residence (independent living) Living Arrangements: Alone Available Help at Discharge: Neighbor Type of Home: Apartment Home Access: Level entry     Home Layout: One level (has elevator in  apartment)     Bathroom Shower/Tub: Occupational psychologist: Standard (grab bar and sink close)     Home Equipment: Grab bars - toilet;Grab bars - tub/shower;Shower seat - built in          Prior Functioning/Environment Level of Independence: Independent             OT Diagnosis:  Acute pain   OT Problem List: Decreased coordination;Decreased knowledge of use of DME or AE;Decreased knowledge of precautions;Pain;Impaired UE functional use;Impaired sensation   OT Treatment/Interventions: Self-care/ADL training;Therapeutic exercise;DME and/or AE instruction;Therapeutic activities;Patient/family education;Balance training    OT Goals(Current goals can be found in the care plan section) Acute Rehab OT Goals Patient Stated Goal: not stated OT Goal Formulation: With patient Time For Goal Achievement: 03/24/15 Potential to Achieve Goals: Good ADL Goals Pt Will Perform Upper Body Dressing: with modified independence;sitting Pt Will Perform Lower Body Dressing: with modified independence;sit to/from stand Pt Will Transfer to Toilet: with modified independence;ambulating;regular height toilet;grab bars Additional ADL Goal #1: Pt will independently perform HEP for bilateral hands to increase coordination.  OT Frequency: Min 2X/week   Barriers to D/C:            Co-evaluation              End of Session Equipment Utilized During Treatment: Gait belt;Cervical collar Nurse Communication: Mobility status  Activity Tolerance: Patient tolerated treatment well Patient left: in bed;with call bell/phone within reach   Time: 0932-0953 OT Time Calculation (min): 21 min Charges:  OT General Charges $OT Visit: 1 Procedure OT Evaluation $Initial OT Evaluation Tier I: 1 Procedure G-CodesBenito Mccreedy OTR/L [id number] 03/17/2015, 10:30 AM

## 2015-03-17 NOTE — Progress Notes (Signed)
PT Cancellation and Discharge Note  Patient Details Name: Angela [name] MRN: [id number] DOB: Jun 26, 1946   Cancelled Treatment:    Reason Eval/Treat Not Completed: PT screened, no needs identified, will sign off.  Pt seen by OT and no PT needs identified at this time.  Will sign off.     Caylei Sperry, Thornton Papas 03/17/2015, 10:11 AM

## 2015-03-17 NOTE — Progress Notes (Signed)
Subjective: Patient reports Doing well significant improvement right arm and hand pain neck stiffness getting better  Objective: Vital signs in last 24 hours: Temp:  [97.8 F (36.6 C)-98.5 F (36.9 C)] 98.5 F (36.9 C) (05/03 0309) Pulse Rate:  [67-87] 87 (05/03 0309) Resp:  [15-23] 18 (05/03 0309) BP: (115-167)/(52-76) 167/56 mmHg (05/03 0309) SpO2:  [93 %-99 %] 95 % (05/03 0309) Weight:  [96.163 kg (212 lb)] 96.163 kg (212 lb) (05/02 1030)  Intake/Output from previous day: 05/02 0701 - 05/03 0700 In: 1500 [I.V.:1500] Out: 485 [Urine:170; Drains:90; Blood:225] Intake/Output this shift:    Strength improved 5 out of 5 right and incision clean dry and intact  Lab Results: No results for input(s): WBC, HGB, HCT, PLT in the last 72 hours. BMET No results for input(s): NA, K, CL, CO2, GLUCOSE, BUN, CREATININE, CALCIUM in the last 72 hours.  Studies/Results: Dg Cervical Spine 2-3 Views  03/16/2015   CLINICAL DATA:  Posterior cervical fusion from C6-T1.  EXAM: CERVICAL SPINE - 2-3 VIEW; DG C-ARM 61-120 MIN  COMPARISON:  CT 03/13/2015  FINDINGS: Three intraoperative spot images demonstrate placement of posterior pedicle screws from C6-T1. It is difficult to visualize the pedicles or vertebral bodies on the lateral views due to overlying shoulders.  IMPRESSION: Posterior fusion C6-T1. Difficult to visualize vertebral bodies due to overlying shoulders.   Electronically Signed   By: Rolm Baptise M.D.   On: 03/16/2015 17:00   Dg C-arm 1-60 Min  03/16/2015   CLINICAL DATA:  Posterior cervical fusion from C6-T1.  EXAM: CERVICAL SPINE - 2-3 VIEW; DG C-ARM 61-120 MIN  COMPARISON:  CT 03/13/2015  FINDINGS: Three intraoperative spot images demonstrate placement of posterior pedicle screws from C6-T1. It is difficult to visualize the pedicles or vertebral bodies on the lateral views due to overlying shoulders.  IMPRESSION: Posterior fusion C6-T1. Difficult to visualize vertebral bodies due to overlying  shoulders.   Electronically Signed   By: Rolm Baptise M.D.   On: 03/16/2015 17:00    Assessment/Plan: Mobilized today with physical occupational therapy continue work on pain control problem discharge in the morning  LOS: 1 day     Lalisa Kiehn P 03/17/2015, 7:33 AM

## 2015-03-17 NOTE — Progress Notes (Signed)
Utilization review completed.  

## 2015-03-18 LAB — GLUCOSE, CAPILLARY: GLUCOSE-CAPILLARY: 86 mg/dL (ref 70–99)

## 2015-03-18 MED ORDER — PROMETHAZINE HCL 25 MG PO TABS
25.0000 mg | ORAL_TABLET | Freq: Four times a day (QID) | ORAL | Status: DC | PRN
Start: 2015-03-18 — End: 2015-04-29

## 2015-03-18 MED ORDER — TIZANIDINE HCL 4 MG PO TABS: 4.0000 mg | ORAL_TABLET | Freq: Three times a day (TID) | ORAL | Status: DC

## 2015-03-18 MED ORDER — OXYCODONE-ACETAMINOPHEN 5-325 MG PO TABS: 1.0000 | ORAL_TABLET | ORAL | Status: DC | PRN

## 2015-03-18 NOTE — Progress Notes (Signed)
Pt doing well. Pt given D/C instructions with Rx's, verbal understanding was provided. Pt's incision is clean and dry with no evidence of infection. Pt's IV and Hemovac were removed prior to D/C. Pt D/C'd home via wheelchair with son per MD order. Pt is stable @ D/C and has no other needs at this time. Holli Humbles, RN

## 2015-03-18 NOTE — Progress Notes (Signed)
Patient ID: Angela [name], female   DOB: Sep 29, 1946, 69 y.o.   MRN: [id number] Doing well no arm pain no hand pain condition of neck stiffness  Awake alert strength out of 5 wound clean dry and intact  Discharge home

## 2015-03-18 NOTE — Progress Notes (Signed)
Occupational Therapy Treatment Patient Details Name: Angela [name] MRN: [id number] DOB: Jun 18, 1946 Today's Date: 03/18/2015    History of present illness 69 y.o. s/p POSTERIOR CERVICAL LAMINECTOMY AND DISKECTOMOY AT CERVICAL SEVEN-THORACIC ONE ;POSTERIOR CERVICAL FUSION CERVICAL SIX TO THORACIC ONE  Pt with previous back surgery.   OT comments  Pt performed fine motor coordination exercise/activities. Education provided.  Follow Up Recommendations  No OT follow up;Supervision - Intermittent    Equipment Recommendations  None recommended by OT    Recommendations for Other Services      Precautions / Restrictions Precautions Precautions: Cervical Precaution Comments: discussed in session Required Braces or Orthoses: Cervical Brace Cervical Brace: Soft collar Restrictions Weight Bearing Restrictions: No       Mobility Bed Mobility Overal bed mobility: Modified Independent                Transfers                 General transfer comment: not assessed        ADL Overall ADL's : Needs assistance/impaired Eating/Feeding: Sitting;Independent                                     General ADL Comments: Educated/reviewed safety tips.  Told pt to ask about showering with or without cervical collar. Pt able to bring leg up to her to reach LB. Pt plans to sponge bathe for now and recommended pt have someone with her if she changes her mind.       Vision                     Perception     Praxis      Cognition  Awake/Alert Behavior During Therapy: WFL for tasks assessed/performed Overall Cognitive Status: Within Functional Limits for tasks assessed                       Extremity/Trunk Assessment               Exercises Other Exercises Other Exercises: educated and pt performed fine motor coordination activities/exercise with both hands. Handout provided   Shoulder Instructions       General Comments       Pertinent Vitals/ Pain       Pain Assessment: 0-10 Pain Score: 7  Pain Location: neck Pain Descriptors / Indicators: Burning Pain Intervention(s): Monitored during session  Home Living                                          Prior Functioning/Environment              Frequency Min 2X/week     Progress Toward Goals  OT Goals(current goals can now be found in the care plan section)  Progress towards OT goals: Progressing toward goals  Acute Rehab OT Goals Patient Stated Goal: not stated OT Goal Formulation: With patient Time For Goal Achievement: 03/24/15 Potential to Achieve Goals: Good ADL Goals Pt Will Perform Upper Body Dressing: with modified independence;sitting Pt Will Perform Lower Body Dressing: with modified independence;sit to/from stand Pt Will Transfer to Toilet: with modified independence;ambulating;regular height toilet;grab bars Additional ADL Goal #1: Pt will independently perform HEP for bilateral hands to increase coordination.  Plan Discharge plan remains appropriate  Co-evaluation                 End of Session Equipment Utilized During Treatment: Cervical collar   Activity Tolerance Patient tolerated treatment well   Patient Left in bed;with call bell/phone within reach   Nurse Communication          Time: 4360-6770 OT Time Calculation (min): 15 min  Charges: OT General Charges $OT Visit: 1 Procedure OT Treatments $Therapeutic Activity: 8-22 mins  Benito Mccreedy OTR/L [phone] 03/18/2015, 9:47 AM

## 2015-03-18 NOTE — Discharge Instructions (Signed)
No lifting no bending no twisting no driving a riding a car unless she is coming back and forth to see me. Keep the incision clean dry and intact. The dressing that's on there is watertight he may shower today or tomorrow. Remove the outer dressing in 3-4 days leave the Steri-Strips on and intact.

## 2015-03-18 NOTE — Discharge Summary (Signed)
Physician Discharge Summary  Patient ID: Angela [name] MRN: [id number] DOB/AGE: 04-06-1946 69 y.o.  Admit date: 03/16/2015 Discharge date: 03/18/2015  Admission Diagnoses: Cervical spondylosis spondylosis C6 C7 T1 and pseudoarthrosis C6-7   Discharge Diagnoses: Same Active Problems:   HNP (herniated nucleus pulposus)   Discharged Condition: good  Hospital Course: Patient admitted hospital underwent decompressive cervical laminectomy and foraminotomies and discectomy C7-T1. Posterior cervical fusion C6-T1. Postoperatively did very well recovered for [place] resembling voiding spontaneously tolerating regular diet was stable for discharge home on postop day 2.  Consults: Significant Diagnostic Studies: Treatments: Posterior cervical decompressive laminectomy and foraminotomies C7-T1 and posterior cervical fusion C6-T1 Discharge Exam: Blood pressure 135/68, pulse 65, temperature 98.3 F (36.8 C), temperature source Oral, resp. rate 18, height 5' 7.5" (1.715 m), weight 96.163 kg (212 lb), SpO2 99 %. Strength out of 5 wound clean dry and intact  Disposition: Home     Medication List    TAKE these medications        ACCU-CHEK AVIVA PLUS test strip  Generic drug:  glucose blood     amLODipine 5 MG tablet  Commonly known as:  NORVASC  Take 5 mg by mouth daily.     atorvastatin 40 MG tablet  Commonly known as:  LIPITOR  Take 40 mg by mouth daily at 6 PM.     budesonide-formoterol 160-4.5 MCG/ACT inhaler  Commonly known as:  SYMBICORT  Inhale 2 puffs into the lungs 2 (two) times daily.     buPROPion 150 MG 24 hr tablet  Commonly known as:  WELLBUTRIN XL  Take 150 mg by mouth daily.     fluticasone 50 MCG/ACT nasal spray  Commonly known as:  FLONASE  Place 2 sprays into both nostrils 2 (two) times daily.     hydrochlorothiazide 25 MG tablet  Commonly known as:  HYDRODIURIL  Take 25 mg by mouth daily.     ibuprofen 200 MG tablet  Commonly known as:  ADVIL,MOTRIN  Take  200 mg by mouth every 6 (six) hours as needed for mild pain or moderate pain.     lisinopril 20 MG tablet  Commonly known as:  PRINIVIL,ZESTRIL  Take 20 mg by mouth daily.     metFORMIN 500 MG tablet  Commonly known as:  GLUCOPHAGE  Take 500 mg by mouth 2 (two) times daily with a meal.     metoprolol succinate 25 MG 24 hr tablet  Commonly known as:  TOPROL-XL  Take 0.5 tablets (12.5 mg total) by mouth daily.     oxyCODONE-acetaminophen 5-325 MG per tablet  Commonly known as:  PERCOCET/ROXICET  Take 1-2 tablets by mouth every 4 (four) hours as needed for moderate pain.     PHENERGAN PO  Take 1 tablet by mouth 3 (three) times daily as needed (nausea).     promethazine 25 MG tablet  Commonly known as:  PHENERGAN  Take 1 tablet (25 mg total) by mouth every 6 (six) hours as needed for nausea or vomiting.     PROBIOTIC DAILY PO  Take 1 tablet by mouth daily at 12 noon.     SUPER B-C PO  Take 1 tablet by mouth daily at 12 noon.     tiZANidine 4 MG tablet  Commonly known as:  ZANAFLEX  Take 1 tablet (4 mg total) by mouth 3 (three) times daily.     traMADol 50 MG tablet  Commonly known as:  ULTRAM  Take 50 mg by mouth daily as needed for  moderate pain or severe pain.           Follow-up Information    Follow up with [facility], MD.   Specialty:  Neurosurgery   Contact information:   [address] [place] [address] [phone]       Signed: Elaina Hoops 03/18/2015, 7:45 AM

## 2015-04-24 ENCOUNTER — Encounter (HOSPITAL_COMMUNITY): Payer: Self-pay | Admitting: Emergency Medicine

## 2015-04-24 ENCOUNTER — Emergency Department (HOSPITAL_COMMUNITY)
Admission: EM | Admit: 2015-04-24 | Discharge: 2015-04-24 | Disposition: A | Discharge status: Home / Self Care | Payer: Medicare Other | Attending: Emergency Medicine | Admitting: Emergency Medicine

## 2015-04-24 DIAGNOSIS — Z8719 Personal history of other diseases of the digestive system: Secondary | ICD-10-CM | POA: Insufficient documentation

## 2015-04-24 DIAGNOSIS — G4730 Sleep apnea, unspecified: Secondary | ICD-10-CM | POA: Insufficient documentation

## 2015-04-24 DIAGNOSIS — Z79899 Other long term (current) drug therapy: Secondary | ICD-10-CM | POA: Diagnosis not present

## 2015-04-24 DIAGNOSIS — I129 Hypertensive chronic kidney disease with stage 1 through stage 4 chronic kidney disease, or unspecified chronic kidney disease: Secondary | ICD-10-CM | POA: Insufficient documentation

## 2015-04-24 DIAGNOSIS — Z8619 Personal history of other infectious and parasitic diseases: Secondary | ICD-10-CM | POA: Diagnosis not present

## 2015-04-24 DIAGNOSIS — Z8739 Personal history of other diseases of the musculoskeletal system and connective tissue: Secondary | ICD-10-CM | POA: Insufficient documentation

## 2015-04-24 DIAGNOSIS — E119 Type 2 diabetes mellitus without complications: Secondary | ICD-10-CM | POA: Insufficient documentation

## 2015-04-24 DIAGNOSIS — N189 Chronic kidney disease, unspecified: Secondary | ICD-10-CM | POA: Diagnosis not present

## 2015-04-24 DIAGNOSIS — Z87891 Personal history of nicotine dependence: Secondary | ICD-10-CM | POA: Diagnosis not present

## 2015-04-24 DIAGNOSIS — E875 Hyperkalemia: Secondary | ICD-10-CM | POA: Insufficient documentation

## 2015-04-24 DIAGNOSIS — Z9981 Dependence on supplemental oxygen: Secondary | ICD-10-CM | POA: Diagnosis not present

## 2015-04-24 DIAGNOSIS — Z862 Personal history of diseases of the blood and blood-forming organs and certain disorders involving the immune mechanism: Secondary | ICD-10-CM | POA: Insufficient documentation

## 2015-04-24 DIAGNOSIS — T465X5A Adverse effect of other antihypertensive drugs, initial encounter: Secondary | ICD-10-CM | POA: Insufficient documentation

## 2015-04-24 DIAGNOSIS — F418 Other specified anxiety disorders: Secondary | ICD-10-CM | POA: Diagnosis not present

## 2015-04-24 DIAGNOSIS — Z7951 Long term (current) use of inhaled steroids: Secondary | ICD-10-CM | POA: Insufficient documentation

## 2015-04-24 DIAGNOSIS — I952 Hypotension due to drugs: Secondary | ICD-10-CM | POA: Insufficient documentation

## 2015-04-24 DIAGNOSIS — R079 Chest pain, unspecified: Secondary | ICD-10-CM | POA: Diagnosis not present

## 2015-04-24 DIAGNOSIS — J449 Chronic obstructive pulmonary disease, unspecified: Secondary | ICD-10-CM | POA: Insufficient documentation

## 2015-04-24 DIAGNOSIS — R42 Dizziness and giddiness: Secondary | ICD-10-CM | POA: Diagnosis present

## 2015-04-24 LAB — CBC WITH DIFFERENTIAL/PLATELET
BASOS ABS: 0.1 10*3/uL (ref 0.0–0.1)
Basophils Relative: 1 % (ref 0–1)
Eosinophils Absolute: 0.2 10*3/uL (ref 0.0–0.7)
Eosinophils Relative: 3 % (ref 0–5)
HEMATOCRIT: 33.9 % — AB (ref 36.0–46.0)
Hemoglobin: 10.9 g/dL — ABNORMAL LOW (ref 12.0–15.0)
LYMPHS ABS: 2.2 10*3/uL (ref 0.7–4.0)
LYMPHS PCT: 31 % (ref 12–46)
MCH: 28.1 pg (ref 26.0–34.0)
MCHC: 32.2 g/dL (ref 30.0–36.0)
MCV: 87.4 fL (ref 78.0–100.0)
MONO ABS: 0.7 10*3/uL (ref 0.1–1.0)
Monocytes Relative: 10 % (ref 3–12)
NEUTROS ABS: 3.8 10*3/uL (ref 1.7–7.7)
NEUTROS PCT: 55 % (ref 43–77)
Platelets: 246 10*3/uL (ref 150–400)
RBC: 3.88 MIL/uL (ref 3.87–5.11)
RDW: 13.1 % (ref 11.5–15.5)
WBC: 7.1 10*3/uL (ref 4.0–10.5)

## 2015-04-24 LAB — BASIC METABOLIC PANEL
ANION GAP: 9 (ref 5–15)
BUN: 21 mg/dL — ABNORMAL HIGH (ref 6–20)
CO2: 22 mmol/L (ref 22–32)
Calcium: 9.1 mg/dL (ref 8.9–10.3)
Chloride: 107 mmol/L (ref 101–111)
Creatinine, Ser: 2.01 mg/dL — ABNORMAL HIGH (ref 0.44–1.00)
GFR calc Af Amer: 29 mL/min — ABNORMAL LOW (ref >60)
GFR, EST NON AFRICAN AMERICAN: 25 mL/min — AB (ref >60)
Glucose, Bld: 88 mg/dL (ref 65–99)
POTASSIUM: 5.5 mmol/L — AB (ref 3.5–5.1)
Sodium: 138 mmol/L (ref 135–145)

## 2015-04-24 LAB — I-STAT TROPONIN, ED
Troponin i, poc: 0 ng/mL (ref 0.00–0.08)
Troponin i, poc: 0 ng/mL (ref 0.00–0.08)

## 2015-04-24 MED ORDER — SODIUM CHLORIDE 0.9 % IV BOLUS (SEPSIS)
1000.0000 mL | Freq: Once | INTRAVENOUS | Status: AC
  Administered 2015-04-24: 1000 mL via INTRAVENOUS

## 2015-04-24 NOTE — ED Provider Notes (Addendum)
CSN: [id number]     Arrival date & time 04/24/15  1052 History   First MD Initiated Contact with Patient 04/24/15 1103     Chief Complaint  Patient presents with  . Dizziness  . Chest Pain     (Consider location/radiation/quality/duration/timing/severity/associated sxs/prior Treatment) HPI Patient became light headed approximate 8 AM today. Symptoms lasted for approximately 2 hours. She had 20 minutes of left-sided anterior nonradiating chest pain. She was treated by EMS with one sublingual nitroglycerin and aspirin 324 mg prior to arrival. She is presently asymptomatic. Brought by EMS. No other associated symptoms. Presently asymptomatic Past Medical History  Diagnosis Date  . Diabetes mellitus   . Hypertension   . Depression     with anxiety  . DDD (degenerative disc disease)   . Fibromyalgia   . COPD (chronic obstructive pulmonary disease)   . Sleep apnea     wears CPAP  . Diverticulitis   . Diverticulosis   . Esophageal dysmotility 03/01/06    mild  . Anemia 2014    mild  . Complication of anesthesia     passes out 24 hrs after getting  . Chronic kidney disease     early stages of some insufficiency  . Shingles    Past Surgical History  Procedure Laterality Date  . Appendectomy    . Tonsillectomy    . Gastric fundoplication    . Back surgery    . Hernia repair    . Knee arthroscopy      left   . De quervain's release      right  . Anterior fusion cervical spine    . Abdominal hysterectomy    . Posterior cervical fusion/foraminotomy N/A 03/16/2015    Procedure: POSTERIOR CERVICAL LAMINECTOMY AND DISKECTOMOY AT CERVICAL SEVEN-THORACIC ONE ;POSTERIOR CERVICAL FUSION CERVICAL SIX TO THORACIC ONE;  Surgeon: Kary Kos, MD;  Location: [facility];  Service: Neurosurgery;  Laterality: N/A;  Site is Cervical/Thoracic   Family History  Problem Relation Age of Onset  . COPD Father   . Colon cancer Maternal Aunt   . Stomach cancer Maternal Grandfather    History   Substance Use Topics  . Smoking status: Former Smoker -- 1.00 packs/day for 22 years    Types: Cigarettes  . Smokeless tobacco: Never Used     Comment: Quit 1 year ago in June 2013.    Marland Kitchen Alcohol Use: No   OB History    No data available     Review of Systems  HENT: Negative.   Respiratory: Negative.   Cardiovascular: Positive for chest pain.  Gastrointestinal: Negative.   Musculoskeletal: Negative.   Skin: Negative.   Neurological: Positive for weakness.  Psychiatric/Behavioral: Negative.   All other systems reviewed and are negative.     Allergies  Codeine; Iohexol; and Ciprofloxacin  Home Medications   Prior to Admission medications   Medication Sig Start Date End Date Taking? Authorizing Provider  ACCU-CHEK AVIVA PLUS test strip  10/31/14   Historical Provider, MD  amLODipine (NORVASC) 5 MG tablet Take 5 mg by mouth daily.  01/18/15   Historical Provider, MD  atorvastatin (LIPITOR) 40 MG tablet Take 40 mg by mouth daily at 6 PM.  01/14/15   Historical Provider, MD  B Complex-C (SUPER B-C PO) Take 1 tablet by mouth daily at 12 noon.     Historical Provider, MD  budesonide-formoterol (SYMBICORT) 160-4.5 MCG/ACT inhaler Inhale 2 puffs into the lungs 2 (two) times daily.    Historical  Provider, MD  buPROPion (WELLBUTRIN XL) 150 MG 24 hr tablet Take 150 mg by mouth daily.  01/20/14   Historical Provider, MD  fluticasone (FLONASE) 50 MCG/ACT nasal spray Place 2 sprays into both nostrils 2 (two) times daily.  11/20/14   Historical Provider, MD  hydrochlorothiazide (HYDRODIURIL) 25 MG tablet Take 25 mg by mouth daily.  06/26/14   Historical Provider, MD  ibuprofen (ADVIL,MOTRIN) 200 MG tablet Take 200 mg by mouth every 6 (six) hours as needed for mild pain or moderate pain.    Historical Provider, MD  lisinopril (PRINIVIL,ZESTRIL) 20 MG tablet Take 20 mg by mouth daily.    Historical Provider, MD  metFORMIN (GLUCOPHAGE) 500 MG tablet Take 500 mg by mouth 2 (two) times daily with a meal.     Historical Provider, MD  metoprolol succinate (TOPROL-XL) 25 MG 24 hr tablet Take 0.5 tablets (12.5 mg total) by mouth daily. 07/16/14   Brittainy Erie Noe, PA-C  oxyCODONE-acetaminophen (PERCOCET/ROXICET) 5-325 MG per tablet Take 1-2 tablets by mouth every 4 (four) hours as needed for moderate pain. 03/18/15   Kary Kos, MD  Probiotic Product (PROBIOTIC DAILY PO) Take 1 tablet by mouth daily at 12 noon.     Historical Provider, MD  promethazine (PHENERGAN) 25 MG tablet Take 1 tablet (25 mg total) by mouth every 6 (six) hours as needed for nausea or vomiting. 03/18/15   Kary Kos, MD  Promethazine HCl (PHENERGAN PO) Take 1 tablet by mouth 3 (three) times daily as needed (nausea).    Historical Provider, MD  tiZANidine (ZANAFLEX) 4 MG tablet Take 1 tablet (4 mg total) by mouth 3 (three) times daily. 03/18/15   Kary Kos, MD  traMADol (ULTRAM) 50 MG tablet Take 50 mg by mouth daily as needed for moderate pain or severe pain.  02/04/15   Historical Provider, MD   BP 107/54 mmHg  Pulse 59  Temp(Src) 98.3 F (36.8 C) (Oral)  Resp 14  SpO2 97% Physical Exam  Constitutional: She appears well-developed and well-nourished.  HENT:  Head: Normocephalic and atraumatic.  Eyes: Conjunctivae are normal. Pupils are equal, round, and reactive to light.  Neck: Neck supple. No tracheal deviation present. No thyromegaly present.  Cardiovascular: Normal rate and regular rhythm.   No murmur heard. Pulmonary/Chest: Effort normal and breath sounds normal.  Abdominal: Soft. Bowel sounds are normal. She exhibits no distension. There is no tenderness.  Musculoskeletal: Normal range of motion. She exhibits no edema or tenderness.  Neurological: She is alert. Coordination normal.  Skin: Skin is warm and dry. No rash noted.  Psychiatric: She has a normal mood and affect.  Nursing note and vitals reviewed.   ED Course  Procedures (including critical care time) Labs Review Labs Reviewed - No data to display  Imaging  Review No results found.   EKG Interpretation   Date/Time:  Friday April 24 2015 11:04:07 EDT Ventricular Rate:  60 PR Interval:  187 QRS Duration: 87 QT Interval:  431 QTC Calculation: 431 R Axis:   -28 Text Interpretation:  Sinus rhythm Borderline left axis deviation Low  voltage, precordial leads No significant change since last tracing  Confirmed by Winfred Leeds  MD, Rikita Grabert [phone]) on 04/24/2015 11:47:09 AM     4:35 PM patient asymptomatic. Not lightheaded on standing Results for orders placed or performed during the hospital encounter of 60/73/71  Basic metabolic panel  Result Value Ref Range   Sodium 138 135 - 145 mmol/L   Potassium 5.5 (H) 3.5 - 5.1  mmol/L   Chloride 107 101 - 111 mmol/L   CO2 22 22 - 32 mmol/L   Glucose, Bld 88 65 - 99 mg/dL   BUN 21 (H) 6 - 20 mg/dL   Creatinine, Ser 2.01 (H) 0.44 - 1.00 mg/dL   Calcium 9.1 8.9 - 10.3 mg/dL   GFR calc non Af Amer 25 (L) >60 mL/min   GFR calc Af Amer 29 (L) >60 mL/min   Anion gap 9 5 - 15  CBC with Differential/Platelet  Result Value Ref Range   WBC 7.1 4.0 - 10.5 K/uL   RBC 3.88 3.87 - 5.11 MIL/uL   Hemoglobin 10.9 (L) 12.0 - 15.0 g/dL   HCT 33.9 (L) 36.0 - 46.0 %   MCV 87.4 78.0 - 100.0 fL   MCH 28.1 26.0 - 34.0 pg   MCHC 32.2 30.0 - 36.0 g/dL   RDW 13.1 11.5 - 15.5 %   Platelets 246 150 - 400 K/uL   Neutrophils Relative % 55 43 - 77 %   Neutro Abs 3.8 1.7 - 7.7 K/uL   Lymphocytes Relative 31 12 - 46 %   Lymphs Abs 2.2 0.7 - 4.0 K/uL   Monocytes Relative 10 3 - 12 %   Monocytes Absolute 0.7 0.1 - 1.0 K/uL   Eosinophils Relative 3 0 - 5 %   Eosinophils Absolute 0.2 0.0 - 0.7 K/uL   Basophils Relative 1 0 - 1 %   Basophils Absolute 0.1 0.0 - 0.1 K/uL  I-stat troponin, ED  Result Value Ref Range   Troponin i, poc 0.00 0.00 - 0.08 ng/mL   Comment 3          I-stat troponin, ED  Result Value Ref Range   Troponin i, poc 0.00 0.00 - 0.08 ng/mL   Comment 3           No results found.  MDM  Spoke with  Dr.Skains if patient remains stable and has 2 negative cardiac markers she is okay for discharge and follow-up at office next week. We will discontinue amlodipine and hydrochlorothiazide Final diagnoses:  None   doubt acute coronary syndrome based on history and breath and he of chest pain nonacute EKG, 2 negative cardiac markers Diagnosis #1 hypotension #2 nonspecific chest pain #3 chronic renal insufficiency #4 mild hyperkalemia      Orlie Dakin, MD 04/24/15 1646  Orlie Dakin, MD 04/24/15 3267

## 2015-04-24 NOTE — ED Notes (Signed)
Pt here from home with c/o dizziness and chest pressure , pt was orthostatic on scene , pt received 1 nitro and 324 asa by ems

## 2015-04-24 NOTE — ED Notes (Signed)
Pt A&OX4, ambulatory at d/c with steady gait, NAD 

## 2015-04-24 NOTE — ED Notes (Signed)
Pt up ambulatory to the bathroom at this time 

## 2015-04-24 NOTE — Discharge Instructions (Signed)
Stop taking amlodipine and hydrochlorothiazide. Call Dr. Kandis Mannan office on Monday, 04/27/2015 to arrange to be seen in the office next week. Return if you feel worse for any reason

## 2015-04-24 NOTE — ED Notes (Signed)
Pt undressed, in gown, on monitor, continuous pulse oximetry and blood pressure cuff 

## 2015-04-29 ENCOUNTER — Ambulatory Visit (INDEPENDENT_AMBULATORY_CARE_PROVIDER_SITE_OTHER): Payer: Medicare Other | Admitting: Cardiology

## 2015-04-29 ENCOUNTER — Encounter: Payer: Self-pay | Admitting: Cardiology

## 2015-04-29 VITALS — BP 122/60 | HR 72 | Ht 67.5 in | Wt 198.1 lb

## 2015-04-29 DIAGNOSIS — I10 Essential (primary) hypertension: Secondary | ICD-10-CM | POA: Diagnosis not present

## 2015-04-29 DIAGNOSIS — I952 Hypotension due to drugs: Secondary | ICD-10-CM | POA: Diagnosis not present

## 2015-04-29 DIAGNOSIS — E669 Obesity, unspecified: Secondary | ICD-10-CM

## 2015-04-29 DIAGNOSIS — R001 Bradycardia, unspecified: Secondary | ICD-10-CM | POA: Diagnosis not present

## 2015-04-29 NOTE — Patient Instructions (Signed)
Medication Instructions:  Your physician recommends that you continue on your current medications as directed. Please refer to the Current Medication list given to you today.  Follow-Up: Follow up in 4 months with Richardson Dopp, PA.  Thank you for choosing [facility]!!

## 2015-04-29 NOTE — Progress Notes (Signed)
[facility]. [address], [address], [place]  [address] Phone: [phone] Fax:  [phone]  Date:  04/29/2015   ID:  Angela [name], DOB 02/05/1946, MRN [id number]  PCP:  Lynne Logan, MD   History of Present Illness: Angela [name] is a 69 y.o. female with diabetes, hypertension, hyperlipidemia, anxiety, chronic kidney disease here for the follow-up of hypertension with recent ER visit for the setting of hypotension.  She was in the emergency department on 04/24/15 with severe hypotension, systolic blood pressure of 64. She called EMS. She was transported via ambulance. We stopped her hydrochlorothiazide and amlodipine. Overall doing better. No further dizziness or syncope.  2009 stress test, NUC. Low risk.  Carotid bruit. Right. Doppler 07/2013 - no significant disease bilaterally.   In the past, we had to decrease her metoprolol because of bradycardia. This is improved. She also after having weight loss, had lower blood pressure in the past.  In the distant past, her blood pressure was always 536 systolic. This changed however as she aged.  She also had been experiencing photophobia and was told that she had migraine without pain. She also sometimes gets a rash on her left inner ankle.  Recent cervical fusion surgery in May 2016.     Wt Readings from Last 3 Encounters:  04/29/15 198 lb 1.9 oz (89.867 kg)  03/16/15 212 lb (96.163 kg)  03/11/15 212 lb 4.8 oz (96.299 kg)     Past Medical History  Diagnosis Date  . Diabetes mellitus   . Hypertension   . Depression     with anxiety  . DDD (degenerative disc disease)   . Fibromyalgia   . COPD (chronic obstructive pulmonary disease)   . Sleep apnea     wears CPAP  . Diverticulitis   . Diverticulosis   . Esophageal dysmotility 03/01/06    mild  . Anemia 2014    mild  . Complication of anesthesia     passes out 24 hrs after getting  . Chronic kidney disease     early stages of some insufficiency  . Shingles      Past Surgical History  Procedure Laterality Date  . Appendectomy    . Tonsillectomy    . Gastric fundoplication    . Back surgery    . Hernia repair    . Knee arthroscopy      left   . De quervain's release      right  . Anterior fusion cervical spine    . Abdominal hysterectomy    . Posterior cervical fusion/foraminotomy N/A 03/16/2015    Procedure: POSTERIOR CERVICAL LAMINECTOMY AND DISKECTOMOY AT CERVICAL SEVEN-THORACIC ONE ;POSTERIOR CERVICAL FUSION CERVICAL SIX TO THORACIC ONE;  Surgeon: Kary Kos, MD;  Location: [facility];  Service: Neurosurgery;  Laterality: N/A;  Site is Cervical/Thoracic    Current Outpatient Prescriptions  Medication Sig Dispense Refill  . ACCU-CHEK AVIVA PLUS test strip TEST BLOOD GLUCOSE ONCE D  1  . atorvastatin (LIPITOR) 40 MG tablet Take 40 mg by mouth daily at 6 PM.   2  . B Complex-C (SUPER B-C PO) Take 1 tablet by mouth daily at 12 noon.     . budesonide-formoterol (SYMBICORT) 160-4.5 MCG/ACT inhaler Inhale 2 puffs into the lungs 2 (two) times daily.    Marland Kitchen buPROPion (WELLBUTRIN XL) 150 MG 24 hr tablet Take 150 mg by mouth daily.     . Chromium Picolinate 1000 MCG TABS Take 1,000 mcg  by mouth daily.    . Coenzyme Q10 (CO Q10) 100 MG CAPS Take 100 mg by mouth daily.    . fluticasone (FLONASE) 50 MCG/ACT nasal spray Place 2 sprays into both nostrils 2 (two) times daily.   4  . ibuprofen (ADVIL,MOTRIN) 200 MG tablet Take 200 mg by mouth every 6 (six) hours as needed for mild pain or moderate pain.    Marland Kitchen lisinopril (PRINIVIL,ZESTRIL) 20 MG tablet Take 40 mg by mouth daily.     . metFORMIN (GLUCOPHAGE) 500 MG tablet Take 500 mg by mouth 2 (two) times daily with a meal.    . metoprolol succinate (TOPROL-XL) 25 MG 24 hr tablet Take 0.5 tablets (12.5 mg total) by mouth daily. 90 tablet 2  . tiZANidine (ZANAFLEX) 4 MG tablet Take 1 tablet (4 mg total) by mouth 3 (three) times daily. 60 tablet 0  . traMADol (ULTRAM) 50 MG tablet Take 50 mg by mouth daily  as needed for moderate pain or severe pain.   0  . vitamin B-12 (CYANOCOBALAMIN) 500 MCG tablet Take 500 mcg by mouth daily.    Marland Kitchen amLODipine (NORVASC) 5 MG tablet Take 5 mg by mouth daily.   1  . hydrochlorothiazide (HYDRODIURIL) 25 MG tablet Take 25 mg by mouth daily.      No current facility-administered medications for this visit.    Allergies:    Allergies  Allergen Reactions  . Codeine Nausea And Vomiting  . Iohexol      Desc: Pt states several years ago during a CT scan w/ iv cotnrast she had severe nausea and vomiting, sob w/ difficulty breathing and swallowing.  She had full 13hr. premeds today (5/14/7) and did fine w/o complications.   . Ciprofloxacin Rash    Social History:  The patient  reports that she has quit smoking. Her smoking use included Cigarettes. She has a 22 pack-year smoking history. She has never used smokeless tobacco. She reports that she does not drink alcohol or use illicit drugs.  She use to work in [employer].  ROS:  Please see the history of present illness.   No fevers, no chills, no bleeding    PHYSICAL EXAM: VS:  BP 122/60 mmHg  Pulse 72  Ht 5' 7.5" (1.715 m)  Wt 198 lb 1.9 oz (89.867 kg)  BMI 30.55 kg/m2 Well nourished, well developed, in no acute distress HEENT: normalCervical spinal fusion scar noted. Posterior neck. Neck: no JVD Cardiac:  normal S1, S2; RRR; no murmur Lungs:  clear to auscultation bilaterally, no wheezing, rhonchi or rales Abd: soft, nontender, no hepatomegaly Ext: trace edema Skin: warm and dry Neuro: no focal abnormalities noted  EKG:  None today     ASSESSMENT AND PLAN:  1. Hypotension related to drugs and weight loss-she was seen in the emergency department with quite significantly low blood pressures. I spoke with Dr. Winfred Leeds - we stopped amlodipine and hydrochlorothiazide. Her blood pressure is much better currently. She still has periods of elevation in the 160-170 range at times. I will hesitate on  increasing her medication. She would like to try potentially an experiment with half dose metoprolol twice a day. This would not be unreasonable. 2. Hypertension- as above. Adjusting medications as weight is coming off. Recent neck surgery. 3.  Bradycardia-on 07/16/14 her metoprolol was decreased to 12.5 mg a day because of sinus bradycardia rate 52. She was mildly symptomatic with fatigue.  4. Obesity-continue to improve weight loss.  She joined [employer]. As weight  decreases, her blood pressure medication may decrease as well. We may need to pull back on other medications. 5. 4 month f/u   Signed, Candee Furbish, MD [facility]  04/29/2015 11:19 AM

## 2015-05-11 ENCOUNTER — Other Ambulatory Visit: Payer: Self-pay

## 2015-05-19 ENCOUNTER — Telehealth: Payer: Self-pay | Admitting: Cardiology

## 2015-05-19 NOTE — Telephone Encounter (Signed)
Patient states that at 2:00 pm today her blood pressure 185/80 HR 67. Patient states she took the other half of Metoprolol and it brought her blood pressure down to 151/70  Patient concerned since blood pressure seems to be all over the place Last muscle relaxer on Sunday Will forward to Dr Marlou Porch for review

## 2015-05-19 NOTE — Telephone Encounter (Signed)
Since she just had another episode of low BP (associate with muscle relaxer), lets continue current meds, lisinopril 20 QD, and metoprolol 12.5 QD  Candee Furbish, MD

## 2015-05-19 NOTE — Telephone Encounter (Signed)
Pt states that she "didn't feel right" Sunday and she checked her BP before taking her meds and it was 124/67. Pt states 20 mins after taking medications her BP was 73/35, HR 61. Pt has not taken her Tizanidine since Sunday night and states "I will never put that in my mouth again." Pt denies SOB, CP, dizziness or lightheadedness. Pt states she is currently taking Lisinopril 40mg - Takes 20mg  QD and Metoprolol Succinate 25mg - Takes 12.5mg  daily. Pt states that BP checks since stopping Tizanidine have been 176/73 and 185/80. Informed pt that I would route this information to Dr. Marlou Porch for review and advisement.

## 2015-05-19 NOTE — Telephone Encounter (Signed)
New Message  Pt calling to speak w/ RN about pt medication. Per pt: Tizanidine has caused BP to drop, per RX- should be considered as allergy. Please call back about alternative. Please call back and discuss.

## 2015-05-20 NOTE — Telephone Encounter (Signed)
Increase metoprolol to 25mg  QD Candee Furbish, MD

## 2015-05-20 NOTE — Telephone Encounter (Signed)
Advised patient of changes, verbalized understanding

## 2015-07-25 ENCOUNTER — Emergency Department (HOSPITAL_COMMUNITY)
Admission: EM | Admit: 2015-07-25 | Discharge: 2015-07-25 | Disposition: A | Discharge status: Home / Self Care | Payer: Medicare Other | Attending: Emergency Medicine | Admitting: Emergency Medicine

## 2015-07-25 ENCOUNTER — Encounter (HOSPITAL_COMMUNITY): Payer: Self-pay | Admitting: Emergency Medicine

## 2015-07-25 DIAGNOSIS — I129 Hypertensive chronic kidney disease with stage 1 through stage 4 chronic kidney disease, or unspecified chronic kidney disease: Secondary | ICD-10-CM | POA: Insufficient documentation

## 2015-07-25 DIAGNOSIS — Z87891 Personal history of nicotine dependence: Secondary | ICD-10-CM | POA: Diagnosis not present

## 2015-07-25 DIAGNOSIS — G4730 Sleep apnea, unspecified: Secondary | ICD-10-CM | POA: Insufficient documentation

## 2015-07-25 DIAGNOSIS — Z79899 Other long term (current) drug therapy: Secondary | ICD-10-CM | POA: Insufficient documentation

## 2015-07-25 DIAGNOSIS — N189 Chronic kidney disease, unspecified: Secondary | ICD-10-CM | POA: Diagnosis not present

## 2015-07-25 DIAGNOSIS — Z8619 Personal history of other infectious and parasitic diseases: Secondary | ICD-10-CM | POA: Insufficient documentation

## 2015-07-25 DIAGNOSIS — E119 Type 2 diabetes mellitus without complications: Secondary | ICD-10-CM | POA: Insufficient documentation

## 2015-07-25 DIAGNOSIS — R109 Unspecified abdominal pain: Secondary | ICD-10-CM | POA: Diagnosis present

## 2015-07-25 DIAGNOSIS — Z8739 Personal history of other diseases of the musculoskeletal system and connective tissue: Secondary | ICD-10-CM | POA: Diagnosis not present

## 2015-07-25 DIAGNOSIS — Z90710 Acquired absence of both cervix and uterus: Secondary | ICD-10-CM | POA: Diagnosis not present

## 2015-07-25 DIAGNOSIS — Z862 Personal history of diseases of the blood and blood-forming organs and certain disorders involving the immune mechanism: Secondary | ICD-10-CM | POA: Diagnosis not present

## 2015-07-25 DIAGNOSIS — K5793 Diverticulitis of intestine, part unspecified, without perforation or abscess with bleeding: Secondary | ICD-10-CM | POA: Insufficient documentation

## 2015-07-25 DIAGNOSIS — J449 Chronic obstructive pulmonary disease, unspecified: Secondary | ICD-10-CM | POA: Diagnosis not present

## 2015-07-25 DIAGNOSIS — F418 Other specified anxiety disorders: Secondary | ICD-10-CM | POA: Diagnosis not present

## 2015-07-25 DIAGNOSIS — Z9981 Dependence on supplemental oxygen: Secondary | ICD-10-CM | POA: Insufficient documentation

## 2015-07-25 DIAGNOSIS — K5792 Diverticulitis of intestine, part unspecified, without perforation or abscess without bleeding: Secondary | ICD-10-CM

## 2015-07-25 DIAGNOSIS — Z7951 Long term (current) use of inhaled steroids: Secondary | ICD-10-CM | POA: Insufficient documentation

## 2015-07-25 LAB — CBC
HEMATOCRIT: 34.6 % — AB (ref 36.0–46.0)
HEMOGLOBIN: 11.1 g/dL — AB (ref 12.0–15.0)
MCH: 28.1 pg (ref 26.0–34.0)
MCHC: 32.1 g/dL (ref 30.0–36.0)
MCV: 87.6 fL (ref 78.0–100.0)
Platelets: 242 10*3/uL (ref 150–400)
RBC: 3.95 MIL/uL (ref 3.87–5.11)
RDW: 13.1 % (ref 11.5–15.5)
WBC: 6.7 10*3/uL (ref 4.0–10.5)

## 2015-07-25 LAB — LIPASE, BLOOD: Lipase: 22 U/L (ref 22–51)

## 2015-07-25 LAB — COMPREHENSIVE METABOLIC PANEL
ALT: 17 U/L (ref 14–54)
ANION GAP: 7 (ref 5–15)
AST: 20 U/L (ref 15–41)
Albumin: 3.9 g/dL (ref 3.5–5.0)
Alkaline Phosphatase: 101 U/L (ref 38–126)
BILIRUBIN TOTAL: 0.5 mg/dL (ref 0.3–1.2)
BUN: 14 mg/dL (ref 6–20)
CO2: 25 mmol/L (ref 22–32)
Calcium: 9.4 mg/dL (ref 8.9–10.3)
Chloride: 109 mmol/L (ref 101–111)
Creatinine, Ser: 1.21 mg/dL — ABNORMAL HIGH (ref 0.44–1.00)
GFR calc Af Amer: 52 mL/min — ABNORMAL LOW (ref >60)
GFR calc non Af Amer: 45 mL/min — ABNORMAL LOW (ref >60)
GLUCOSE: 176 mg/dL — AB (ref 65–99)
Potassium: 4.1 mmol/L (ref 3.5–5.1)
SODIUM: 141 mmol/L (ref 135–145)
Total Protein: 7.4 g/dL (ref 6.5–8.1)

## 2015-07-25 MED ORDER — OXYCODONE-ACETAMINOPHEN 5-325 MG PO TABS
1.0000 | ORAL_TABLET | Freq: Once | ORAL | Status: AC
Start: 2015-07-25 — End: 2015-07-25
  Administered 2015-07-25: 1 via ORAL
  Filled 2015-07-25: qty 1

## 2015-07-25 MED ORDER — ONDANSETRON 4 MG PO TBDP
4.0000 mg | ORAL_TABLET | Freq: Once | ORAL | Status: AC
  Administered 2015-07-25: 4 mg via ORAL
  Filled 2015-07-25: qty 1

## 2015-07-25 MED ORDER — AMOXICILLIN-POT CLAVULANATE 875-125 MG PO TABS: 1.0000 | ORAL_TABLET | Freq: Two times a day (BID) | ORAL | Status: DC

## 2015-07-25 MED ORDER — AMOXICILLIN-POT CLAVULANATE 875-125 MG PO TABS
1.0000 | ORAL_TABLET | Freq: Once | ORAL | Status: AC
  Administered 2015-07-25: 1 via ORAL
  Filled 2015-07-25: qty 1

## 2015-07-25 MED ORDER — OXYCODONE-ACETAMINOPHEN 5-325 MG PO TABS: 1.0000 | ORAL_TABLET | ORAL | Status: DC | PRN

## 2015-07-25 MED ORDER — ONDANSETRON 4 MG PO TBDP: 4.0000 mg | ORAL_TABLET | Freq: Three times a day (TID) | ORAL | Status: DC | PRN

## 2015-07-25 NOTE — ED Provider Notes (Signed)
CSN: [id number]     Arrival date & time 07/25/15  1424 History   First MD Initiated Contact with Patient 07/25/15 2031     Chief Complaint  Patient presents with  . Abdominal Pain     (Consider location/radiation/quality/duration/timing/severity/associated sxs/prior Treatment) HPI Comments: 69 year old female with past medical history including alpha-1 antitrypsin deficiency, COPD, hypertension, diabetes, diverticulitis who presents with left-sided abdominal pain. The patient states that 3 days ago, she began having left lower quadrant abdominal pain which feels exactly like previous episodes of diverticulitis. She denies any diarrhea, constipation, or blood in her stool. Last bowel movement was yesterday and was normal. She denies any associated nausea, vomiting, fevers, or dysuria. She has a long history of intermittent mild hematuria which has been extensively worked up in the past with negative studies.  Patient is a 69 y.o. female presenting with abdominal pain. The history is provided by the patient.  Abdominal Pain   Past Medical History  Diagnosis Date  . Diabetes mellitus   . Hypertension   . Depression     with anxiety  . DDD (degenerative disc disease)   . Fibromyalgia   . COPD (chronic obstructive pulmonary disease)   . Sleep apnea     wears CPAP  . Diverticulitis   . Diverticulosis   . Esophageal dysmotility 03/01/06    mild  . Anemia 2014    mild  . Complication of anesthesia     passes out 24 hrs after getting  . Chronic kidney disease     early stages of some insufficiency  . Shingles    Past Surgical History  Procedure Laterality Date  . Appendectomy    . Tonsillectomy    . Gastric fundoplication    . Back surgery    . Hernia repair    . Knee arthroscopy      left   . De quervain's release      right  . Anterior fusion cervical spine    . Abdominal hysterectomy    . Posterior cervical fusion/foraminotomy N/A 03/16/2015    Procedure: POSTERIOR  CERVICAL LAMINECTOMY AND DISKECTOMOY AT CERVICAL SEVEN-THORACIC ONE ;POSTERIOR CERVICAL FUSION CERVICAL SIX TO THORACIC ONE;  Surgeon: Kary Kos, MD;  Location: [facility];  Service: Neurosurgery;  Laterality: N/A;  Site is Cervical/Thoracic   Family History  Problem Relation Age of Onset  . COPD Father   . Colon cancer Maternal Aunt   . Stomach cancer Maternal Grandfather    Social History  Substance Use Topics  . Smoking status: Former Smoker -- 1.00 packs/day for 22 years    Types: Cigarettes  . Smokeless tobacco: Never Used     Comment: Quit 1 year ago in June 2013.    Marland Kitchen Alcohol Use: No   OB History    No data available     Review of Systems  Gastrointestinal: Positive for abdominal pain.   10 Systems reviewed and are negative for acute change except as noted in the HPI.    Allergies  Codeine; Iohexol; Tizanidine; and Ciprofloxacin  Home Medications   Prior to Admission medications   Medication Sig Start Date End Date Taking? Authorizing Provider  ACCU-CHEK AVIVA PLUS test strip TEST BLOOD GLUCOSE ONCE D 02/11/15  Yes Historical Provider, MD  acetaminophen (TYLENOL) 500 MG tablet Take 500 mg by mouth every 6 (six) hours as needed for mild pain, moderate pain or headache.   Yes Historical Provider, MD  atorvastatin (LIPITOR) 40 MG tablet Take 40 mg  by mouth daily.  01/14/15  Yes Historical Provider, MD  B Complex-C (SUPER B-C PO) Take 1 tablet by mouth daily at 12 noon.    Yes Historical Provider, MD  budesonide-formoterol (SYMBICORT) 160-4.5 MCG/ACT inhaler Inhale 2 puffs into the lungs 2 (two) times daily.   Yes Historical Provider, MD  buPROPion (WELLBUTRIN XL) 150 MG 24 hr tablet Take 150 mg by mouth daily.  01/20/14  Yes Historical Provider, MD  Coenzyme Q10 (CO Q10) 100 MG CAPS Take 100 mg by mouth daily.   Yes Historical Provider, MD  cyclobenzaprine (FLEXERIL) 10 MG tablet Take 10 mg by mouth at bedtime as needed for muscle spasms.  06/09/15  Yes Historical Provider,  MD  fluticasone (FLONASE) 50 MCG/ACT nasal spray Place 2 sprays into both nostrils daily as needed for allergies.  11/20/14  Yes Historical Provider, MD  ibuprofen (ADVIL,MOTRIN) 200 MG tablet Take 200 mg by mouth every 6 (six) hours as needed for mild pain or moderate pain.   Yes Historical Provider, MD  lisinopril (PRINIVIL,ZESTRIL) 20 MG tablet Take 20 mg by mouth daily.    Yes Historical Provider, MD  metFORMIN (GLUCOPHAGE) 500 MG tablet Take 500 mg by mouth 2 (two) times daily with a meal.   Yes Historical Provider, MD  metoprolol succinate (TOPROL-XL) 25 MG 24 hr tablet Take 25 mg by mouth daily.   Yes Historical Provider, MD  promethazine (PHENERGAN) 25 MG tablet Take 25 mg by mouth every 6 (six) hours as needed for nausea or vomiting.   Yes Historical Provider, MD  traMADol (ULTRAM) 50 MG tablet Take 50 mg by mouth daily as needed for moderate pain or severe pain.  02/04/15  Yes Historical Provider, MD  vitamin B-12 (CYANOCOBALAMIN) 500 MCG tablet Take 500 mcg by mouth daily.   Yes Historical Provider, MD  amoxicillin-clavulanate (AUGMENTIN) 875-125 MG per tablet Take 1 tablet by mouth every 12 (twelve) hours. 07/25/15   Sharlett Iles, MD  ondansetron (ZOFRAN ODT) 4 MG disintegrating tablet Take 1 tablet (4 mg total) by mouth every 8 (eight) hours as needed for nausea or vomiting. 07/25/15   Sharlett Iles, MD  oxyCODONE-acetaminophen (PERCOCET) 5-325 MG per tablet Take 1 tablet by mouth every 4 (four) hours as needed. 07/25/15   Wenda Overland Little, MD   BP 151/72 mmHg  Pulse 68  Temp(Src) 97.9 F (36.6 C) (Oral)  Resp 18  SpO2 99% Physical Exam  Constitutional: She is oriented to person, place, and time. She appears well-developed and well-nourished. No distress.  HENT:  Head: Normocephalic and atraumatic.  Moist mucous membranes  Eyes: Conjunctivae are normal. Pupils are equal, round, and reactive to light.  Neck: Neck supple.  Cardiovascular: Normal rate, regular rhythm  and normal heart sounds.   No murmur heard. Pulmonary/Chest: Effort normal.  Occasional expiratory wheezes bilaterally, no crackles  Abdominal: Soft. Bowel sounds are normal. She exhibits no distension.  Tenderness to palpation of the left lower quadrant with no rebound or guarding  Musculoskeletal: She exhibits no edema.  Neurological: She is alert and oriented to person, place, and time.  Fluent speech  Skin: Skin is warm and dry.  Psychiatric: She has a normal mood and affect. Judgment normal.  Nursing note and vitals reviewed.   ED Course  Procedures (including critical care time) Labs Review Labs Reviewed  COMPREHENSIVE METABOLIC PANEL - Abnormal; Notable for the following:    Glucose, Bld 176 (*)    Creatinine, Ser 1.21 (*)    GFR  calc non Af Amer 45 (*)    GFR calc Af Amer 52 (*)    All other components within normal limits  CBC - Abnormal; Notable for the following:    Hemoglobin 11.1 (*)    HCT 34.6 (*)    All other components within normal limits  LIPASE, BLOOD    Imaging Review No results found. I have personally reviewed and evaluated these lab results as part of my medical decision-making.   EKG Interpretation None     Medications  amoxicillin-clavulanate (AUGMENTIN) 875-125 MG per tablet 1 tablet (1 tablet Oral Given 07/25/15 2121)  oxyCODONE-acetaminophen (PERCOCET/ROXICET) 5-325 MG per tablet 1 tablet (1 tablet Oral Given 07/25/15 2121)  ondansetron (ZOFRAN-ODT) disintegrating tablet 4 mg (4 mg Oral Given 07/25/15 2121)    MDM   Final diagnoses:  Diverticulitis of intestine without perforation or abscess without bleeding   69 year old female with past medical history including diverticulitis who presents with 3 days of left lower quadrant abdominal pain consistent with previous episodes of diverticulitis. Patient well-appearing with normal vital signs at presentation. She had left lower quadrant tenderness with no rebound and no signs of peritonitis.  Obtained above lab work which showed stable CK D with creatinine of 1.2. WBC count normal. The patient states that all of her symptoms are consistent with diverticulitis and she is well-appearing. She states that she has tolerated oral antibiotic treatment at home in the past. Gave the patient a dose of Augmentin and given her report of an allergy to Cipro. Also gave her Zofran and Percocet. Provided patient with prescriptions for these medications and instructed on return precautions. Instructed to follow-up with gastroenterologist and provided contact information. Patient voiced understanding of plan and return precautions and patient discharged in satisfactory condition.  Sharlett Iles, MD 07/25/15 [phone]

## 2015-07-25 NOTE — ED Notes (Signed)
Pt has diverticulitis and c/o flare up. Pt has not had n/v yet.

## 2015-07-25 NOTE — Discharge Instructions (Signed)

## 2015-07-27 ENCOUNTER — Telehealth: Payer: Self-pay | Admitting: Internal Medicine

## 2015-07-27 ENCOUNTER — Encounter: Payer: Self-pay | Admitting: Gastroenterology

## 2015-07-27 NOTE — Telephone Encounter (Signed)
Patient wanted to let Dr Ardis Hughs know she followed his instructions and called to schedule an OV with him and was told he was booked until November. She went to her PCP and has had her medication issues taken care of.

## 2015-07-27 NOTE — Telephone Encounter (Signed)
error 

## 2015-07-27 NOTE — Telephone Encounter (Signed)
Left a message for patient to call back. 

## 2015-08-11 ENCOUNTER — Other Ambulatory Visit: Payer: Medicare Other

## 2015-08-11 ENCOUNTER — Ambulatory Visit (INDEPENDENT_AMBULATORY_CARE_PROVIDER_SITE_OTHER): Payer: Medicare Other | Admitting: Internal Medicine

## 2015-08-11 ENCOUNTER — Encounter: Payer: Self-pay | Admitting: Internal Medicine

## 2015-08-11 VITALS — BP 148/80 | HR 74 | Ht 67.5 in | Wt 201.0 lb

## 2015-08-11 DIAGNOSIS — R0689 Other abnormalities of breathing: Secondary | ICD-10-CM

## 2015-08-11 DIAGNOSIS — R0600 Dyspnea, unspecified: Secondary | ICD-10-CM

## 2015-08-11 DIAGNOSIS — Z8349 Family history of other endocrine, nutritional and metabolic diseases: Secondary | ICD-10-CM | POA: Diagnosis not present

## 2015-08-11 DIAGNOSIS — Z720 Tobacco use: Secondary | ICD-10-CM

## 2015-08-11 DIAGNOSIS — Z87891 Personal history of nicotine dependence: Secondary | ICD-10-CM

## 2015-08-11 NOTE — Patient Instructions (Addendum)
Smoking history  Family history of alpha 1 antitrypsin deficiency  Dyspnea and respiratory abnormality  - Do full pulmonary function test - Do high resolution CT chest without contrast - Do alpha-1 antitrypsin phenotype  Follow-up -Return to see me after all of the above in the next few weeks to discuss results in next step - consider stopping ace inhibitor at follow-up

## 2015-08-11 NOTE — Progress Notes (Signed)
Subjective:    Patient ID: Angela [name], female    DOB: 07/24/1946, 69 y.o.   MRN: [id number] PCP Jonathon Bellows, MD  HPI  IOV 08/11/2015  Chief Complaint  Patient presents with  . Pulmonary Consult    Pt referred by Dr. Maurice Small for alpha 1 deficiency. Pt c/o SOB with rest and activity. Pt stats hse wears nightly CPAP. Pt c/o intermittent prod cough with yellow mucus.     69 year old female referred by Dr. Justin Mend for evaluation of alpha-1 antitrypsin deficiency.  She is a former 22 pack smoker having quit smoking 2 years ago. 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 She reports that as a child she had childhood asthma with recurrent bronchitis and wheezing eventually she outgrew the asthma. Back as a child she even had admissions. As an adult she continues to have recurrent bronchitis particularly in the winter when she has 3 episodes of bronchitis characterized by wheezing, shortness of breath, chest tightness meeting antibodies or prednisone. However no admissions or emergency room visits in the last several years or at least a decade for the same. Symptoms are definitely made worse by the winter season, cold weather and relieved by albuterol as needed. Albuterol is only rescue medication she uses. She is not on any maintenance inhalers. She also gets mild flareups in the spring during the pollen season and summer during the hot weather especially when it is extremely humid. Between episodes she is largely well except that in the last few years she is reporting progressive chest tightness and some dyspnea that is mild with exertion. Based on symptoms only mild but exacerbations can be moderate to severe  Most recently her sister was diagnosed with a low antitrypsin level. No phenotype drawn per patient history needed so she went and saw Dr. Justin Mend who repeated it and her level is 81 07/06/2015. I personally visualized this resulted in the patient telephone  based app. Patient is requesting a phenotype  Walking desaturation test 185 feet 3 laps on RA: No desaturation   Chest x-ray done in the health system and I personally visualized this film 04/14/2013 dated: Reports of possible interstitial lung disease which I agree with  CT scan of the abdomen lung cut from 2007: Possible subtle scarring in the bases   has a past medical history of Diabetes mellitus; Hypertension; Depression; DDD (degenerative disc disease); Fibromyalgia; COPD (chronic obstructive pulmonary disease); Sleep apnea; Diverticulitis; Diverticulosis; Esophageal dysmotility (03/01/06); Anemia (2014); Complication of anesthesia; Chronic kidney disease; and Shingles.   reports that she quit smoking about 3 years ago. Her smoking use included Cigarettes. She has a 22 pack-year smoking history. She has never used smokeless tobacco.  Past Surgical History  Procedure Laterality Date  . Appendectomy    . Tonsillectomy    . Gastric fundoplication    . Back surgery    . Hernia repair    . Knee arthroscopy      left   . De quervain's release      right  . Anterior fusion cervical spine    . Abdominal hysterectomy    . Posterior cervical fusion/foraminotomy N/A 03/16/2015    Procedure: POSTERIOR CERVICAL LAMINECTOMY AND DISKECTOMOY AT CERVICAL SEVEN-THORACIC ONE ;POSTERIOR CERVICAL FUSION CERVICAL SIX TO THORACIC ONE;  Surgeon: Kary Kos, MD;  Location: [facility];  Service: Neurosurgery;  Laterality: N/A;  Site is Cervical/Thoracic    Allergies  Allergen Reactions  .  Codeine Nausea And Vomiting  . Iohexol      Desc: Pt states several years ago during a CT scan w/ iv cotnrast she had severe nausea and vomiting, sob w/ difficulty breathing and swallowing.  She had full 13hr. premeds today (5/14/7) and did fine w/o complications.   . Tizanidine Other (See Comments)    Severe decrease in BP   . Ciprofloxacin Rash    Immunization History  Administered Date(s) Administered  .  Pneumococcal-Unspecified 11/14/2012    Family History  Problem Relation Age of Onset  . COPD Father   . Colon cancer Maternal Aunt   . Stomach cancer Maternal Grandfather      Current outpatient prescriptions:  .  ACCU-CHEK AVIVA PLUS test strip, TEST BLOOD GLUCOSE ONCE D, Disp: , Rfl: 1 .  atorvastatin (LIPITOR) 40 MG tablet, Take 40 mg by mouth daily. , Disp: , Rfl: 2 .  B Complex-C (SUPER B-C PO), Take 1 tablet by mouth daily at 12 noon. , Disp: , Rfl:  .  BILBERRY, VACCINIUM MYRTILLUS, PO, Take by mouth daily., Disp: , Rfl:  .  budesonide-formoterol (SYMBICORT) 160-4.5 MCG/ACT inhaler, Inhale 2 puffs into the lungs 2 (two) times daily., Disp: , Rfl:  .  buPROPion (WELLBUTRIN XL) 150 MG 24 hr tablet, Take 150 mg by mouth daily. , Disp: , Rfl:  .  Coenzyme Q10 (CO Q10) 100 MG CAPS, Take 100 mg by mouth daily., Disp: , Rfl:  .  cyclobenzaprine (FLEXERIL) 10 MG tablet, Take 10 mg by mouth at bedtime as needed for muscle spasms. , Disp: , Rfl: 0 .  fluticasone (FLONASE) 50 MCG/ACT nasal spray, Place 2 sprays into both nostrils daily as needed for allergies. , Disp: , Rfl: 4 .  lisinopril (PRINIVIL,ZESTRIL) 20 MG tablet, Take 10 mg by mouth daily. , Disp: , Rfl:  .  metFORMIN (GLUCOPHAGE) 500 MG tablet, Take 500 mg by mouth 2 (two) times daily with a meal., Disp: , Rfl:  .  metoprolol succinate (TOPROL-XL) 25 MG 24 hr tablet, Take 25 mg by mouth daily., Disp: , Rfl:  .  ondansetron (ZOFRAN ODT) 4 MG disintegrating tablet, Take 1 tablet (4 mg total) by mouth every 8 (eight) hours as needed for nausea or vomiting., Disp: 10 tablet, Rfl: 0 .  vitamin B-12 (CYANOCOBALAMIN) 500 MCG tablet, Take 500 mcg by mouth daily., Disp: , Rfl:  .  promethazine (PHENERGAN) 25 MG tablet, Take 25 mg by mouth every 6 (six) hours as needed for nausea or vomiting., Disp: , Rfl:  .  traMADol (ULTRAM) 50 MG tablet, Take 50 mg by mouth daily as needed for moderate pain or severe pain. , Disp: , Rfl: 0    Review  of Systems  Constitutional: Negative for fever and unexpected weight change.  HENT: Negative for congestion, dental problem, ear pain, nosebleeds, postnasal drip, rhinorrhea, sinus pressure, sneezing, sore throat and trouble swallowing.   Eyes: Negative for redness and itching.  Respiratory: Positive for cough and shortness of breath. Negative for chest tightness and wheezing.   Cardiovascular: Negative for palpitations and leg swelling.  Gastrointestinal: Negative for nausea and vomiting.  Genitourinary: Negative for dysuria.  Musculoskeletal: Negative for joint swelling.  Skin: Negative for rash.  Neurological: Negative for headaches.  Hematological: Does not bruise/bleed easily.  Psychiatric/Behavioral: Negative for dysphoric mood. The patient is not nervous/anxious.        Objective:   Physical Exam  Constitutional: She is oriented to person, place, and time. She appears  well-developed and well-nourished. No distress.  Body mass index is 31 kg/(m^2).   HENT:  Head: Normocephalic and atraumatic.  Right Ear: External ear normal.  Left Ear: External ear normal.  Mouth/Throat: Oropharynx is clear and moist. No oropharyngeal exudate.  Eyes: Conjunctivae and EOM are normal. Pupils are equal, round, and reactive to light. Right eye exhibits no discharge. Left eye exhibits no discharge. No scleral icterus.  Neck: Normal range of motion. Neck supple. No JVD present. No tracheal deviation present. No thyromegaly present.  Cardiovascular: Normal rate, regular rhythm, normal heart sounds and intact distal pulses.  Exam reveals no gallop and no friction rub.   No murmur heard. Pulmonary/Chest: Effort normal. No respiratory distress. She has no wheezes. She has rales. She exhibits no tenderness.  Abdominal: Soft. Bowel sounds are normal. She exhibits no distension and no mass. There is no tenderness. There is no rebound and no guarding.  Musculoskeletal: Normal range of motion. She exhibits no  edema or tenderness.  Lymphadenopathy:    She has no cervical adenopathy.  Neurological: She is alert and oriented to person, place, and time. She has normal reflexes. No cranial nerve deficit. She exhibits normal muscle tone. Coordination normal.  Skin: Skin is warm and dry. No rash noted. She is not diaphoretic. No erythema. No pallor.  Psychiatric: She has a normal mood and affect. Her behavior is normal. Judgment and thought content normal.  Vitals reviewed.   Filed Vitals:   08/11/15 1550  BP: 148/80  Pulse: 74  Height: 5' 7.5" (1.715 m)  Weight: 201 lb (91.173 kg)  SpO2: 96%         Assessment & Plan:     ICD-9-CM ICD-10-CM   1. Smoking history V15.82 Z72.0   2. Family history of alpha 1 antitrypsin deficiency V18.19 Z83.49 Pulmonary Function Test     Alpha-1 antitrypsin phenotype  3. Dyspnea and respiratory abnormality 786.09 R06.00 Pulmonary Function Test    R06.89 CT Chest High Resolution     Alpha-1 antitrypsin phenotype   Patient might indeed have COPD or asthma based on the history. Alpha-1 deficiency might be playing a role. However multiple consent about presence of interstitial lung disease based on crackles on physical exam and chest x-ray though it could easily be obesity related atelectasis. Off note, she is on ACE inhibitor and this could be contributing to her symptoms  Plan - PFT - High resolution CT chest - Alpha 1 phenotype  Follow-up  - Regroup for follow-up after the above - noted she is on ACE inhibitor which we will have to stop at follow-up  Dr. Brand Males, M.D., [facility].C.P Pulmonary and Critical Care Medicine Staff Physician [facility] Pager: [phone], If no answer or between  15:00h - 7:00h: call [phone]  08/11/2015 4:33 PM

## 2015-08-13 ENCOUNTER — Telehealth: Payer: Self-pay | Admitting: Internal Medicine

## 2015-08-13 NOTE — Telephone Encounter (Signed)
Pt is aware results are not available yet. Aware will call once available.

## 2015-08-13 NOTE — Telephone Encounter (Signed)
Error, wrong md on note

## 2015-08-17 ENCOUNTER — Telehealth: Payer: Self-pay | Admitting: Internal Medicine

## 2015-08-17 LAB — ALPHA-1 ANTITRYPSIN PHENOTYPE: A-1 Antitrypsin: 75 mg/dL — ABNORMAL LOW (ref 83–199)

## 2015-08-17 NOTE — Telephone Encounter (Signed)
Called and spoke to pt. Informed her of the results and recs per MR. Pt verbalized understanding and denied any further questions or concerns at this time.   

## 2015-08-17 NOTE — Telephone Encounter (Signed)
Alpha is low but has one normal gene M and another abnormal Z. This is not bad. Usually normaly gene protects but we will wait for PFT and CT and then decide  anxoous patient pls call

## 2015-08-18 ENCOUNTER — Ambulatory Visit (INDEPENDENT_AMBULATORY_CARE_PROVIDER_SITE_OTHER)
Admission: RE | Admit: 2015-08-18 | Discharge: 2015-08-18 | Disposition: A | Discharge status: Home / Self Care | Payer: Medicare Other | Source: Ambulatory Visit | Attending: Internal Medicine | Admitting: Internal Medicine

## 2015-08-18 DIAGNOSIS — R0600 Dyspnea, unspecified: Secondary | ICD-10-CM

## 2015-08-18 DIAGNOSIS — R0689 Other abnormalities of breathing: Secondary | ICD-10-CM

## 2015-08-28 ENCOUNTER — Ambulatory Visit (INDEPENDENT_AMBULATORY_CARE_PROVIDER_SITE_OTHER): Payer: Medicare Other | Admitting: Internal Medicine

## 2015-08-28 DIAGNOSIS — R0600 Dyspnea, unspecified: Secondary | ICD-10-CM

## 2015-08-28 DIAGNOSIS — Z8349 Family history of other endocrine, nutritional and metabolic diseases: Secondary | ICD-10-CM | POA: Diagnosis not present

## 2015-08-28 DIAGNOSIS — R0689 Other abnormalities of breathing: Secondary | ICD-10-CM

## 2015-08-28 LAB — PULMONARY FUNCTION TEST
DL/VA % pred: 70 %
DL/VA: 3.64 ml/min/mmHg/L
DLCO unc % pred: 53 %
DLCO unc: 15.56 ml/min/mmHg
FEF 25-75 Post: 1.81 L/sec
FEF 25-75 Pre: 1.37 L/sec
FEF2575-%Change-Post: 31 %
FEF2575-%PRED-PRE: 61 %
FEF2575-%Pred-Post: 80 %
FEV1-%Change-Post: 3 %
FEV1-%PRED-POST: 80 %
FEV1-%PRED-PRE: 77 %
FEV1-POST: 2.16 L
FEV1-PRE: 2.1 L
FEV1FVC-%Change-Post: 5 %
FEV1FVC-%Pred-Pre: 96 %
FEV6-%Change-Post: -1 %
FEV6-%PRED-PRE: 81 %
FEV6-%Pred-Post: 80 %
FEV6-POST: 2.73 L
FEV6-Pre: 2.77 L
FEV6FVC-%Change-Post: 1 %
FEV6FVC-%PRED-POST: 104 %
FEV6FVC-%Pred-Pre: 102 %
FVC-%Change-Post: -2 %
FVC-%PRED-PRE: 79 %
FVC-%Pred-Post: 77 %
FVC-POST: 2.75 L
FVC-PRE: 2.81 L
POST FEV6/FVC RATIO: 100 %
PRE FEV1/FVC RATIO: 75 %
Post FEV1/FVC ratio: 79 %
Pre FEV6/FVC Ratio: 99 %
RV % PRED: 59 %
RV: 1.37 L
TLC % PRED: 73 %
TLC: 4.11 L

## 2015-08-28 NOTE — Progress Notes (Signed)
PFT done today. 

## 2015-08-31 ENCOUNTER — Telehealth: Payer: Self-pay | Admitting: Internal Medicine

## 2015-08-31 NOTE — Telephone Encounter (Signed)
PFT shows mild  COPD. Will discuss with her at follow-up

## 2015-08-31 NOTE — Telephone Encounter (Signed)
Called and spoke to pt. Informed her of the results and recs per MR. Pt verbalized understanding and also stated her breathing feels as if it has worsened, pt requesting sooner appt (current appt is 10.26.16). Advised pt MR is not in office all week. Pt stated she will keep her current appt and call back if things worsen. Nothing further needed at this time.

## 2015-09-07 ENCOUNTER — Telehealth: Payer: Self-pay | Admitting: Internal Medicine

## 2015-09-07 NOTE — Telephone Encounter (Signed)
I looked in patient chart. She spoke with Angela [name] last week and do not see anything else documented. Pt did not need anything and has appt for Wednesday. Nothing further needed

## 2015-09-09 ENCOUNTER — Ambulatory Visit (INDEPENDENT_AMBULATORY_CARE_PROVIDER_SITE_OTHER): Payer: Medicare Other | Admitting: Internal Medicine

## 2015-09-09 ENCOUNTER — Other Ambulatory Visit: Payer: Self-pay | Admitting: Internal Medicine

## 2015-09-09 ENCOUNTER — Encounter: Payer: Self-pay | Admitting: Internal Medicine

## 2015-09-09 ENCOUNTER — Other Ambulatory Visit (INDEPENDENT_AMBULATORY_CARE_PROVIDER_SITE_OTHER): Payer: Medicare Other

## 2015-09-09 VITALS — BP 128/68 | HR 68 | Ht 67.5 in | Wt 208.4 lb

## 2015-09-09 DIAGNOSIS — J8410 Pulmonary fibrosis, unspecified: Secondary | ICD-10-CM

## 2015-09-09 DIAGNOSIS — J439 Emphysema, unspecified: Secondary | ICD-10-CM | POA: Diagnosis not present

## 2015-09-09 DIAGNOSIS — J849 Interstitial pulmonary disease, unspecified: Secondary | ICD-10-CM | POA: Insufficient documentation

## 2015-09-09 DIAGNOSIS — J441 Chronic obstructive pulmonary disease with (acute) exacerbation: Secondary | ICD-10-CM | POA: Diagnosis not present

## 2015-09-09 DIAGNOSIS — E8801 Alpha-1-antitrypsin deficiency: Secondary | ICD-10-CM | POA: Diagnosis not present

## 2015-09-09 LAB — SEDIMENTATION RATE: SED RATE: 39 mm/h — AB (ref 0–22)

## 2015-09-09 LAB — RHEUMATOID FACTOR

## 2015-09-09 MED ORDER — CEPHALEXIN 500 MG PO CAPS: 500.0000 mg | ORAL_CAPSULE | Freq: Three times a day (TID) | ORAL | Status: DC

## 2015-09-09 MED ORDER — PREDNISONE 10 MG PO TABS: ORAL_TABLET | ORAL | Status: DC

## 2015-09-09 MED ORDER — LOSARTAN POTASSIUM 25 MG PO TABS: 25.0000 mg | ORAL_TABLET | Freq: Every day | ORAL | Status: DC

## 2015-09-09 MED ORDER — TIOTROPIUM BROMIDE MONOHYDRATE 18 MCG IN CAPS: 18.0000 ug | ORAL_CAPSULE | Freq: Every day | RESPIRATORY_TRACT | Status: DC

## 2015-09-09 NOTE — Patient Instructions (Addendum)
ICD-9-CM ICD-10-CM   1. COPD exacerbation ([place]) 491.21 J44.1   2. Pulmonary emphysema, unspecified emphysema type ([place]) 492.8 J43.9   3. Alpha-1-antitrypsin deficiency ([place]) 273.4 E88.01   4. ILD (interstitial lung disease) ([facility]) 515 J84.9    #COPD exacerbation -Cephalexin 500 mg 3 times a day 5 days - Please take prednisone 40 mg x1 day, then 30 mg x1 day, then 20 mg x1 day, then 10 mg x1 day, and then 5 mg x1 day and stop  #Emphysema with alpha-1 MZ - Please tell her children to get tested for alpha 1 - Currently disease is mild and I recommend only and no surveillance - Stop Symbicort - Start Spiriva 1 puff once daily - Respect desire to refuse flu shot - Change lisinopril to losartan 39m daily  #Interstitial lung disease  - This is a new finding. There are many varieties of this. Unclear what variety you have - Serum: ESR, ACE, ANA, DS-DNA, RF, anti-CCP, ssA, ssB, scl-70, ANCA screen, MPO, PR-3, Total CK,  RNP, Aldolase,  Hypersensitivity Pneumonitis Panel - Further discussion on biopsy v surveillance at followup  #Followup 3 months or sooner if needed

## 2015-09-09 NOTE — Progress Notes (Signed)
Subjective:     Patient ID: Angela [name], female   DOB: [birth year], 69 y.o.   MRN: [id number]  HPI  IOV 08/11/2015  Chief Complaint  Patient presents with  . Pulmonary Consult    Pt referred by Dr. Maurice Small for alpha 1 deficiency. Pt c/o SOB with rest and activity. Pt stats hse wears nightly CPAP. Pt c/o intermittent prod cough with yellow mucus.     69 year old female referred by Dr. Justin Mend for evaluation of alpha-1 antitrypsin deficiency.  She is a former 22 pack smoker having quit smoking 2 years ago. 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 She reports that as a child she had childhood asthma with recurrent bronchitis and wheezing eventually she outgrew the asthma. Back as a child she even had admissions. As an adult she continues to have recurrent bronchitis particularly in the winter when she has 3 episodes of bronchitis characterized by wheezing, shortness of breath, chest tightness meeting antibodies or prednisone. However no admissions or emergency room visits in the last several years or at least a decade for the same. Symptoms are definitely made worse by the winter season, cold weather and relieved by albuterol as needed. Albuterol is only rescue medication she uses. She is not on any maintenance inhalers. She also gets mild flareups in the spring during the pollen season and summer during the hot weather especially when it is extremely humid. Between episodes she is largely well except that in the last few years she is reporting progressive chest tightness and some dyspnea that is mild with exertion. Based on symptoms only mild but exacerbations can be moderate to severe  Most recently her sister was diagnosed with a low antitrypsin level. No phenotype drawn per patient history needed so she went and saw Dr. Justin Mend who repeated it and her level is 81 07/06/2015. I personally visualized this resulted in the patient telephone based app. Patient is  requesting a phenotype  Walking desaturation test 185 feet 3 laps on RA: No desaturation   Chest x-ray done in the health system and I personally visualized this film 04/14/2013 dated: Reports of possible interstitial lung disease which I agree with  CT scan of the abdomen lung cut from 2007: Possible subtle scarring in the bases   OV 09/09/2015  Chief Complaint  Patient presents with  . Follow-up    Pt c/o cough with yellow mucus, wheezing and SOB for 2 weeks.    Follow-up for results of testing for possible alpha-1 results are below    Alpha-1 is MZ and 79 low  Spirometry post bronchodilator FEV1 2.16/80% and a ratio of 79. Prebronchodilator FEV1 is 2.1 L/77%. Total lung capacity is 4.11/72%. DLCO is 15.56/53%. Overall the pattern is restriction with low DLCO. With some element of obstruction in the spirometry.   IMPRESSION: - personally visualized 1. Mild-to-moderate centrilobular, paraseptal and bullous emphysema, upper lobe predominant. 2. Evidence of interstitial lung disease characterized by scattered lower lobe predominant regions of subpleural reticulation and minimal traction bronchiectasis, without appreciable change compared to the visualized lung bases on the 05/28/2013 CT abdomen study. Apparent mild honeycombing in the dependent lower lobes bilaterally (which could represent overlapping mild fibrosis and paraseptal emphysema). Given the apparent two year stability at the lung bases, the leading consideration is a fibrotic nonspecific interstitial pneumonia (NSIP), however usual interstitial pneumonia (UIP) cannot be excluded. If clinically warranted, consider a follow-up high-resolution chest CT study in 12 months  to assess pattern stability. 3. Mild mediastinal lymphadenopathy, nonspecific, likely reactive. 4. Atherosclerosis, including 1 vessel coronary artery disease. Please note that although the presence of coronary artery calcium documents the  presence of coronary artery disease, the severity of this disease and any potential stenosis cannot be assessed on this non-gated CT examination.   Electronically Signed  By: Ilona Sorrel M.D.  On: 08/20/2015 15:59  In interim she is developing new complaints of worsening shortness of breath, cough, wheeze, Ellis sputum for the last 2 weeks. Feels like a bronchitis for her. The nose is a fever or chills or hemoptysis or edema or orthopnea paroxysmal nocturnal dyspnea. Off note she is on lisinopril ACE inhibitor. Also she tells me that she has informed her children about alpha 1. Recommended flu shot but she's not interested. She says that she gets the flu evvery  time after flu shot   Current outpatient prescriptions:  .  ACCU-CHEK AVIVA PLUS test strip, TEST BLOOD GLUCOSE ONCE D, Disp: , Rfl: 1 .  atorvastatin (LIPITOR) 40 MG tablet, Take 40 mg by mouth daily. , Disp: , Rfl: 2 .  B Complex-C (SUPER B-C PO), Take 1 tablet by mouth daily at 12 noon. , Disp: , Rfl:  .  BILBERRY, VACCINIUM MYRTILLUS, PO, Take by mouth daily., Disp: , Rfl:  .  budesonide-formoterol (SYMBICORT) 160-4.5 MCG/ACT inhaler, Inhale 2 puffs into the lungs 2 (two) times daily., Disp: , Rfl:  .  buPROPion (WELLBUTRIN XL) 150 MG 24 hr tablet, Take 150 mg by mouth daily. , Disp: , Rfl:  .  Coenzyme Q10 (CO Q10) 100 MG CAPS, Take 100 mg by mouth daily., Disp: , Rfl:  .  cyclobenzaprine (FLEXERIL) 10 MG tablet, Take 10 mg by mouth at bedtime as needed for muscle spasms. , Disp: , Rfl: 0 .  fluticasone (FLONASE) 50 MCG/ACT nasal spray, Place 2 sprays into both nostrils daily as needed for allergies. , Disp: , Rfl: 4 .  lisinopril (PRINIVIL,ZESTRIL) 20 MG tablet, Take 10 mg by mouth daily. , Disp: , Rfl:  .  metFORMIN (GLUCOPHAGE) 500 MG tablet, Take 500 mg by mouth 2 (two) times daily with a meal., Disp: , Rfl:  .  metoprolol succinate (TOPROL-XL) 25 MG 24 hr tablet, Take 25 mg by mouth daily., Disp: , Rfl:  .  ondansetron  (ZOFRAN ODT) 4 MG disintegrating tablet, Take 1 tablet (4 mg total) by mouth every 8 (eight) hours as needed for nausea or vomiting., Disp: 10 tablet, Rfl: 0 .  promethazine (PHENERGAN) 25 MG tablet, Take 25 mg by mouth every 6 (six) hours as needed for nausea or vomiting., Disp: , Rfl:  .  traMADol (ULTRAM) 50 MG tablet, Take 50 mg by mouth daily as needed for moderate pain or severe pain. , Disp: , Rfl: 0 .  vitamin B-12 (CYANOCOBALAMIN) 500 MCG tablet, Take 500 mcg by mouth daily., Disp: , Rfl:   Allergies  Allergen Reactions  . Codeine Nausea And Vomiting  . Iohexol      Desc: Pt states several years ago during a CT scan w/ iv cotnrast she had severe nausea and vomiting, sob w/ difficulty breathing and swallowing.  She had full 13hr. premeds today (5/14/7) and did fine w/o complications.   . Tizanidine Other (See Comments)    Severe decrease in BP   . Ciprofloxacin Rash       Review of Systems  Per history of present illness     Objective:   Physical  Exam  Constitutional: She is oriented to person, place, and time. She appears well-developed and well-nourished. No distress.  Obese  HENT:  Head: Normocephalic and atraumatic.  Right Ear: External ear normal.  Left Ear: External ear normal.  Mouth/Throat: Oropharynx is clear and moist. No oropharyngeal exudate.  Eyes: Conjunctivae and EOM are normal. Pupils are equal, round, and reactive to light. Right eye exhibits no discharge. Left eye exhibits no discharge. No scleral icterus.  Neck: Normal range of motion. Neck supple. No JVD present. No tracheal deviation present. No thyromegaly present.  Cardiovascular: Normal rate, regular rhythm, normal heart sounds and intact distal pulses.  Exam reveals no gallop and no friction rub.   No murmur heard. Pulmonary/Chest: Effort normal. No respiratory distress. She has no wheezes. She has rales. She exhibits no tenderness.  Right lower lobe greater than left lower lobe crackles   Abdominal: Soft. Bowel sounds are normal. She exhibits no distension and no mass. There is no tenderness. There is no rebound and no guarding.  Musculoskeletal: Normal range of motion. She exhibits no edema or tenderness.  Lymphadenopathy:    She has no cervical adenopathy.  Neurological: She is alert and oriented to person, place, and time. She has normal reflexes. No cranial nerve deficit. She exhibits normal muscle tone. Coordination normal.  Skin: Skin is warm and dry. No rash noted. She is not diaphoretic. No erythema. No pallor.  Psychiatric: She has a normal mood and affect. Her behavior is normal. Judgment and thought content normal.  Vitals reviewed.   Filed Vitals:   09/09/15 1453  BP: 128/68  Pulse: 68  Height: 5' 7.5" (1.715 m)  Weight: 208 lb 6.4 oz (94.53 kg)  SpO2: 98%        Assessment:       ICD-9-CM ICD-10-CM   1. COPD exacerbation ([place]) 491.21 J44.1   2. Pulmonary emphysema, unspecified emphysema type ([place]) 492.8 J43.9   3. Alpha-1-antitrypsin deficiency ([place]) 273.4 E88.01   4. ILD (interstitial lung disease) ([place]) 515 J84.9        Plan:      #COPD exacerbation - new -Cephalexin 500 mg 3 times a day 5 days - Please take prednisone 40 mg x1 day, then 30 mg x1 day, then 20 mg x1 day, then 10 mg x1 day, and then 5 mg x1 day and stop  #Emphysema with alpha-1 MZ - Please tell her children to get tested for alpha 1 - Currently disease is mild and I recommend only and no surveillance - Stop Symbicort - Start Spiriva 1 puff once daily - Respect desire to refuse flu shot - Change lisinopril to losartan 39m daily  #Interstitial lung disease  - This is a new finding. There are many varieties of this. Unclear what variety you have - Serum: ESR, ACE, ANA, DS-DNA, RF, anti-CCP, ssA, ssB, scl-70, ANCA screen, MPO, PR-3, Total CK,  RNP, Aldolase,  Hypersensitivity Pneumonitis Panel - Further discussion on biopsy v surveillance at followup  #Followup 3 months  or sooner if needed      Dr. MBrand Males M.D., FMid State Endoscopy CenterC.P Pulmonary and Critical Care Medicine Staff Physician CPeaceful ValleyPulmonary and Critical Care Pager: [phone] If no answer or between  15:00h - 7:00h: call [phone]  09/09/2015 4:01 PM

## 2015-09-10 LAB — ANTI-SCLERODERMA ANTIBODY: Scleroderma (Scl-70) (ENA) Antibody, IgG: <1

## 2015-09-10 LAB — ANA: ANA: NEGATIVE

## 2015-09-10 LAB — MPO/PR-3 (ANCA) ANTIBODIES
Myeloperoxidase Abs: <1
Serine Protease 3: <1

## 2015-09-10 LAB — ANCA SCREEN W REFLEX TITER: ANCA SCREEN: NEGATIVE

## 2015-09-10 LAB — ANTI-DNA ANTIBODY, DOUBLE-STRANDED

## 2015-09-10 LAB — RNP ANTIBODY: Ribonucleic Protein(ENA) Antibody, IgG: <1

## 2015-09-10 LAB — ANGIOTENSIN CONVERTING ENZYME: Angiotensin-Converting Enzyme: 1 U/L — ABNORMAL LOW (ref 8–52)

## 2015-09-10 LAB — SJOGRENS SYNDROME-A EXTRACTABLE NUCLEAR ANTIBODY: SSA (Ro) (ENA) Antibody, IgG: <1

## 2015-09-10 LAB — CYCLIC CITRUL PEPTIDE ANTIBODY, IGG: Cyclic Citrullin Peptide Ab: <16 Units

## 2015-09-10 LAB — SJOGRENS SYNDROME-B EXTRACTABLE NUCLEAR ANTIBODY: SSB (La) (ENA) Antibody, IgG: <1

## 2015-09-12 LAB — ALDOLASE: Aldolase: 5.9 U/L (ref <=8.1)

## 2015-09-14 ENCOUNTER — Telehealth: Payer: Self-pay | Admitting: Internal Medicine

## 2015-09-14 LAB — HYPERSENSITIVITY PNUEMONITIS PROFILE

## 2015-09-14 NOTE — Telephone Encounter (Signed)
Received call from lab. CKMB and CK total were not collected because they were sent to [facility] and the labs were not placed on ice. A cardiac panel (CKMB and CK total) can be collected in our lab but pt will need to be re-stuck.   MR please advise. Thanks.

## 2015-09-15 LAB — CK TOTAL AND CKMB (NOT AT ARMC): Total CK: 53 U/L (ref 7–177)

## 2015-09-15 NOTE — Telephone Encounter (Signed)
No need to do CK

## 2015-09-15 NOTE — Telephone Encounter (Signed)
Spoke with Santiago Glad in the Lab and notified no cardiac panel needed  She verbalized understanding

## 2015-09-18 ENCOUNTER — Telehealth: Payer: Self-pay | Admitting: Internal Medicine

## 2015-09-18 NOTE — Telephone Encounter (Signed)
Patient called and said that her insurance did pay for Spiriva and that has made a big difference.   Albuterol Neb was given to her by her PCP and she wants to have this refilled.  Patient has 4-5 ampules left and would like 90 day supply.    Pharmacy:  [facility]  MR - ok to refill Albuterol Neb medication?

## 2015-09-21 ENCOUNTER — Other Ambulatory Visit: Payer: Self-pay | Admitting: Internal Medicine

## 2015-09-21 MED ORDER — ALBUTEROL SULFATE (2.5 MG/3ML) 0.083% IN NEBU
2.5000 mg | INHALATION_SOLUTION | Freq: Four times a day (QID) | RESPIRATORY_TRACT | Status: DC | PRN
Start: 2015-09-21 — End: 2015-09-21

## 2015-09-21 NOTE — Telephone Encounter (Signed)
Pt returning call.Angela [name] ° °

## 2015-09-21 NOTE — Telephone Encounter (Signed)
Rx for Albuterol nebulizer sent to [facility] per patient's request.  Ok for refill per Dr. Chase Caller. Patient notified. Nothing further needed. Closing encounter

## 2015-09-21 NOTE — Telephone Encounter (Signed)
Ok to do

## 2015-09-21 NOTE — Telephone Encounter (Signed)
lmomtcb x1 for pt 

## 2015-09-21 NOTE — Telephone Encounter (Signed)
We have not filled her Albuterol Neb medication before,  Dr. Chase Caller, ok to refill neb med?  Please advise.

## 2015-10-12 ENCOUNTER — Other Ambulatory Visit: Payer: Self-pay | Admitting: Cardiology

## 2015-11-13 ENCOUNTER — Other Ambulatory Visit: Payer: Self-pay | Admitting: Internal Medicine

## 2015-11-26 ENCOUNTER — Other Ambulatory Visit: Payer: Self-pay

## 2015-11-26 DIAGNOSIS — Z1231 Encounter for screening mammogram for malignant neoplasm of breast: Secondary | ICD-10-CM

## 2015-12-01 ENCOUNTER — Ambulatory Visit
Admission: RE | Admit: 2015-12-01 | Discharge: 2015-12-01 | Disposition: A | Discharge status: Home / Self Care | Payer: Medicare Other | Source: Ambulatory Visit

## 2015-12-01 DIAGNOSIS — Z1231 Encounter for screening mammogram for malignant neoplasm of breast: Secondary | ICD-10-CM

## 2015-12-10 ENCOUNTER — Ambulatory Visit: Payer: Medicare Other | Admitting: Internal Medicine

## 2015-12-11 ENCOUNTER — Encounter: Payer: Self-pay | Admitting: Internal Medicine

## 2015-12-11 ENCOUNTER — Ambulatory Visit (INDEPENDENT_AMBULATORY_CARE_PROVIDER_SITE_OTHER): Payer: Medicare Other | Admitting: Internal Medicine

## 2015-12-11 VITALS — BP 132/70 | HR 66 | Ht 67.5 in | Wt 205.0 lb

## 2015-12-11 DIAGNOSIS — J849 Interstitial pulmonary disease, unspecified: Secondary | ICD-10-CM

## 2015-12-11 DIAGNOSIS — E8801 Alpha-1-antitrypsin deficiency: Secondary | ICD-10-CM

## 2015-12-11 DIAGNOSIS — J439 Emphysema, unspecified: Secondary | ICD-10-CM | POA: Diagnosis not present

## 2015-12-11 NOTE — Progress Notes (Signed)
IOV 08/11/2015  Chief Complaint  Patient presents with  . Pulmonary Consult    Pt referred by Dr. Maurice Small for alpha 1 deficiency. Pt c/o SOB with rest and activity. Pt stats hse wears nightly CPAP. Pt c/o intermittent prod cough with yellow mucus.     70 year old female referred by Dr. Justin Mend for evaluation of alpha-1 antitrypsin deficiency.  She is a former 22 pack smoker having quit smoking 2 years ago. 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 She reports that as a child she had childhood asthma with recurrent bronchitis and wheezing eventually she outgrew the asthma. Back as a child she even had admissions. As an adult she continues to have recurrent bronchitis particularly in the winter when she has 3 episodes of bronchitis characterized by wheezing, shortness of breath, chest tightness meeting antibodies or prednisone. However no admissions or emergency room visits in the last several years or at least a decade for the same. Symptoms are definitely made worse by the winter season, cold weather and relieved by albuterol as needed. Albuterol is only rescue medication she uses. She is not on any maintenance inhalers. She also gets mild flareups in the spring during the pollen season and summer during the hot weather especially when it is extremely humid. Between episodes she is largely well except that in the last few years she is reporting progressive chest tightness and some dyspnea that is mild with exertion. Based on symptoms only mild but exacerbations can be moderate to severe  Most recently her sister was diagnosed with a low antitrypsin level. No phenotype drawn per patient history needed so she went and saw Dr. Justin Mend who repeated it and her level is 81 07/06/2015. I personally visualized this resulted in the patient telephone based app. Patient is requesting a phenotype  Walking desaturation test 185 feet 3 laps on RA: No desaturation   Chest x-ray  done in the health system and I personally visualized this film 04/14/2013 dated: Reports of possible interstitial lung disease which I agree with  CT scan of the abdomen lung cut from 2007: Possible subtle scarring in the bases   OV 09/09/2015  Chief Complaint  Patient presents with  . Follow-up    Pt c/o cough with yellow mucus, wheezing and SOB for 2 weeks.    Follow-up for results of testing for possible alpha-1 results are below    Alpha-1 is MZ and 79 low  Spirometry post bronchodilator FEV1 2.16/80% and a ratio of 79. Prebronchodilator FEV1 is 2.1 L/77%. Total lung capacity is 4.11/72%. DLCO is 15.56/53%. Overall the pattern is restriction with low DLCO. With some element of obstruction in the spirometry.   IMPRESSION: - personally visualized 1. Mild-to-moderate centrilobular, paraseptal and bullous emphysema, upper lobe predominant. 2. Evidence of interstitial lung disease characterized by scattered lower lobe predominant regions of subpleural reticulation and minimal traction bronchiectasis, without appreciable change compared to the visualized lung bases on the 05/28/2013 CT abdomen study. Apparent mild honeycombing in the dependent lower lobes bilaterally (which could represent overlapping mild fibrosis and paraseptal emphysema). Given the apparent two year stability at the lung bases, the leading consideration is a fibrotic nonspecific interstitial pneumonia (NSIP), however usual interstitial pneumonia (UIP) cannot be excluded. If clinically warranted, consider a follow-up high-resolution chest CT study in 12 months to assess pattern stability. 3. Mild mediastinal lymphadenopathy, nonspecific, likely reactive. 4. Atherosclerosis, including 1 vessel coronary artery disease. Please note that although the  presence of coronary artery calcium documents the presence of coronary artery disease, the severity of this disease and any potential stenosis cannot be assessed on  this non-gated CT examination.   Electronically Signed  By: Ilona Sorrel M.D.  On: 08/20/2015 15:59  In interim she is developing new complaints of worsening shortness of breath, cough, wheeze, Ellis sputum for the last 2 weeks. Feels like a bronchitis for her. The nose is a fever or chills or hemoptysis or edema or orthopnea paroxysmal nocturnal dyspnea. Off note she is on lisinopril ACE inhibitor. Also she tells me that she has informed her children about alpha 1. Recommended flu shot but she's not interested. She says that she gets the flu evvery  time after flu shot    OV 12/11/2015  Chief Complaint  Patient presents with  . Follow-up    Pt states her breathing has improved since last OV, pt states she still has good days and bad days. Pt states she has noticed improvement in breathing since stopping the symbicort and starting the spiriva. Pt c/o mild prod cough with yellow mucus and states periodically she has pain under left breast at times when she coughs.     CAT COPD Symptom & Quality of Life Score ([employer] trademark) 0 is no burden. 5 is highest burden 12/11/2015   Never Cough -> Cough all the time   No phlegm in chest -> Chest is full of phlegm   No chest tightness -> Chest feels very tight   No dyspnea for 1 flight stairs/hill -> Very dyspneic for 1 flight of stairs   No limitations for ADL at home -> Very limited with ADL at home   Confident leaving home -> Not at all confident leaving home   Sleep soundly -> Do not sleep soundly because of lung condition   Lots of Energy -> No energy at all   TOTAL Score (max 40)         Current outpatient prescriptions:  .  ACCU-CHEK AVIVA PLUS test strip, TEST BLOOD GLUCOSE ONCE D, Disp: , Rfl: 1 .  albuterol (PROVENTIL) (2.5 MG/3ML) 0.083% nebulizer solution, INAHLE CONTENTS OF ONE VIAL VIA NEBULIZER EVERY 6 HOURS AS NEEDED FOR WHEEZING OR SHORTNESS OF BREATH, Disp: 900 mL, Rfl: 1 .  atorvastatin (LIPITOR) 40 MG tablet, Take 40  mg by mouth daily. , Disp: , Rfl: 2 .  B Complex-C (SUPER B-C PO), Take 1 tablet by mouth daily at 12 noon. , Disp: , Rfl:  .  BILBERRY, VACCINIUM MYRTILLUS, PO, Take by mouth daily., Disp: , Rfl:  .  buPROPion (WELLBUTRIN XL) 150 MG 24 hr tablet, Take 150 mg by mouth daily. , Disp: , Rfl:  .  cyclobenzaprine (FLEXERIL) 10 MG tablet, Take 10 mg by mouth at bedtime as needed for muscle spasms. , Disp: , Rfl: 0 .  fluticasone (FLONASE) 50 MCG/ACT nasal spray, Place 2 sprays into both nostrils daily as needed for allergies. , Disp: , Rfl: 4 .  losartan (COZAAR) 25 MG tablet, TAKE 1 TABLET(25 MG) BY MOUTH DAILY, Disp: 90 tablet, Rfl: 1 .  metFORMIN (GLUCOPHAGE) 500 MG tablet, Take 500 mg by mouth 2 (two) times daily with a meal., Disp: , Rfl:  .  metoprolol succinate (TOPROL-XL) 25 MG 24 hr tablet, TAKE 1/2 TABLET BY MOUTH DAILY, Disp: 90 tablet, Rfl: 0 .  ondansetron (ZOFRAN ODT) 4 MG disintegrating tablet, Take 1 tablet (4 mg total) by mouth every 8 (eight) hours as needed for  nausea or vomiting., Disp: 10 tablet, Rfl: 0 .  promethazine (PHENERGAN) 25 MG tablet, Take 25 mg by mouth every 6 (six) hours as needed for nausea or vomiting., Disp: , Rfl:  .  sertraline (ZOLOFT) 50 MG tablet, Take 50 mg by mouth daily., Disp: , Rfl:  .  tiotropium (SPIRIVA) 18 MCG inhalation capsule, Place 1 capsule (18 mcg total) into inhaler and inhale daily., Disp: 30 capsule, Rfl: 12 .  traMADol (ULTRAM) 50 MG tablet, Take 50 mg by mouth daily as needed for moderate pain or severe pain. , Disp: , Rfl: 0 .  vitamin B-12 (CYANOCOBALAMIN) 500 MCG tablet, Take 500 mcg by mouth daily., Disp: , Rfl:

## 2015-12-11 NOTE — Patient Instructions (Addendum)
ICD-9-CM ICD-10-CM   1. Pulmonary emphysema, unspecified emphysema type ([place]) 492.8 J43.9   2. Alpha-1-antitrypsin deficiency ([place]) 273.4 E88.01   3. ILD (interstitial lung disease) ([place]) 515 J84.9     #Emphysema with alpha-1 MZ - Currently disease is mild and I recommend only  surveillance - Continue Spiriva 1 puff once daily -  #Interstitial lung disease  - This is a new finding fall 2016.Autoimmune negagive Fall 2016 - Do CT chest high resolution - supine and prone  - If ILD still present then refer for surgical lung biopsy  #Followup - await my call on CT chest results and biopsy decision

## 2015-12-11 NOTE — Progress Notes (Signed)
Subjective:     Patient ID: Angela [name], female   DOB: [birth year], 70 y.o.   MRN: [id number]  HPI   OV 12/11/2015  Chief Complaint  Patient presents with  . Follow-up    Pt states her breathing has improved since last OV, pt states she still has good days and bad days. Pt states she has noticed improvement in breathing since stopping the symbicort and starting the spiriva. Pt c/o mild prod cough with yellow mucus and states periodically she has pain under left breast at times when she coughs.     Follow-up dyspnea secondary to obesity, emphysema with alpha-1 MZ phenotype and interstitial lung disease not otherwise specified.   Last seen October 2016. At that time he stopped Symbicort. Added Spiriva. Treated her for COPD exacerbation. These measures have helped. Dyspnea is improved but she still does have dyspnea on exertion relieved by rest. It is mild-moderate in intensity. There was concern about associated interstitial lung disease based on CT chest. We performed our extensive autoimmune profile but this is been negative.  Current outpatient prescriptions:  .  ACCU-CHEK AVIVA PLUS test strip, TEST BLOOD GLUCOSE ONCE D, Disp: , Rfl: 1 .  albuterol (PROVENTIL) (2.5 MG/3ML) 0.083% nebulizer solution, INAHLE CONTENTS OF ONE VIAL VIA NEBULIZER EVERY 6 HOURS AS NEEDED FOR WHEEZING OR SHORTNESS OF BREATH, Disp: 900 mL, Rfl: 1 .  atorvastatin (LIPITOR) 40 MG tablet, Take 40 mg by mouth daily. , Disp: , Rfl: 2 .  B Complex-C (SUPER B-C PO), Take 1 tablet by mouth daily at 12 noon. , Disp: , Rfl:  .  BILBERRY, VACCINIUM MYRTILLUS, PO, Take by mouth daily., Disp: , Rfl:  .  buPROPion (WELLBUTRIN XL) 150 MG 24 hr tablet, Take 150 mg by mouth daily. , Disp: , Rfl:  .  cyclobenzaprine (FLEXERIL) 10 MG tablet, Take 10 mg by mouth at bedtime as needed for muscle spasms. , Disp: , Rfl: 0 .  fluticasone (FLONASE) 50 MCG/ACT nasal spray, Place 2 sprays into both nostrils daily as needed for allergies. ,  Disp: , Rfl: 4 .  losartan (COZAAR) 25 MG tablet, TAKE 1 TABLET(25 MG) BY MOUTH DAILY, Disp: 90 tablet, Rfl: 1 .  metFORMIN (GLUCOPHAGE) 500 MG tablet, Take 500 mg by mouth 2 (two) times daily with a meal., Disp: , Rfl:  .  metoprolol succinate (TOPROL-XL) 25 MG 24 hr tablet, TAKE 1/2 TABLET BY MOUTH DAILY, Disp: 90 tablet, Rfl: 0 .  ondansetron (ZOFRAN ODT) 4 MG disintegrating tablet, Take 1 tablet (4 mg total) by mouth every 8 (eight) hours as needed for nausea or vomiting., Disp: 10 tablet, Rfl: 0 .  promethazine (PHENERGAN) 25 MG tablet, Take 25 mg by mouth every 6 (six) hours as needed for nausea or vomiting., Disp: , Rfl:  .  sertraline (ZOLOFT) 50 MG tablet, Take 50 mg by mouth daily., Disp: , Rfl:  .  tiotropium (SPIRIVA) 18 MCG inhalation capsule, Place 1 capsule (18 mcg total) into inhaler and inhale daily., Disp: 30 capsule, Rfl: 12 .  traMADol (ULTRAM) 50 MG tablet, Take 50 mg by mouth daily as needed for moderate pain or severe pain. , Disp: , Rfl: 0 .  vitamin B-12 (CYANOCOBALAMIN) 500 MCG tablet, Take 500 mcg by mouth daily., Disp: , Rfl:   Review of Systems According to history of present illness.    Objective:   Physical Exam  Constitutional: She is oriented to person, place, and time. She appears well-developed and well-nourished. No distress.  HENT:  Head: Normocephalic and atraumatic.  Right Ear: External ear normal.  Left Ear: External ear normal.  Mouth/Throat: Oropharynx is clear and moist. No oropharyngeal exudate.  Eyes: Conjunctivae and EOM are normal. Pupils are equal, round, and reactive to light. Right eye exhibits no discharge. Left eye exhibits no discharge. No scleral icterus.  Neck: Normal range of motion. Neck supple. No JVD present. No tracheal deviation present. No thyromegaly present.  Cardiovascular: Normal rate, regular rhythm, normal heart sounds and intact distal pulses.  Exam reveals no gallop and no friction rub.   No murmur heard. Pulmonary/Chest:  Effort normal. No respiratory distress. She has no wheezes. She has rales. She exhibits no tenderness.  Abdominal: Soft. Bowel sounds are normal. She exhibits no distension and no mass. There is no tenderness. There is no rebound and no guarding.  Musculoskeletal: Normal range of motion. She exhibits no edema or tenderness.  Lymphadenopathy:    She has no cervical adenopathy.  Neurological: She is alert and oriented to person, place, and time. She has normal reflexes. No cranial nerve deficit. She exhibits normal muscle tone. Coordination normal.  Skin: Skin is warm and dry. No rash noted. She is not diaphoretic. No erythema. No pallor.  Psychiatric: She has a normal mood and affect. Her behavior is normal. Judgment and thought content normal.  Vitals reviewed.  Filed Vitals:   12/11/15 1128  BP: 132/70  Pulse: 66  Height: 5' 7.5" (1.715 m)  Weight: 205 lb (92.987 kg)  SpO2: 95%       Assessment:       ICD-9-CM ICD-10-CM   1. Pulmonary emphysema, unspecified emphysema type ([place]) 492.8 J43.9   2. Alpha-1-antitrypsin deficiency ([place]) 273.4 E88.01   3. ILD (interstitial lung disease) ([employer]) 515 J84.9 CT Chest High Resolution       Plan:      #Emphysema with alpha-1 MZ - Currently disease is mild and I recommend only  surveillance - Continue Spiriva 1 puff once daily -  #Interstitial lung disease  - This is a new finding fall 2016.Autoimmune negagive Fall 2016. I still think this is present because of ongoing crackles. - Do CT chest high resolution - supine and prone  - If ILD still present or worse or definitive diagnosis not showing on CT , then refer for surgical lung biopsy  #Followup - await my call on CT chest results and biopsy decision     (> 50% of this 15 min visit spent in face to face counseling or/and coordination of care)  Dr. Brand Males, M.D., [facility].C.P Pulmonary and Critical Care Medicine Staff Physician [place] Pager: [phone], If no answer or between  15:00h - 7:00h: call [phone]  12/11/2015 12:05 PM

## 2015-12-15 ENCOUNTER — Ambulatory Visit (INDEPENDENT_AMBULATORY_CARE_PROVIDER_SITE_OTHER)
Admission: RE | Admit: 2015-12-15 | Discharge: 2015-12-15 | Disposition: A | Discharge status: Home / Self Care | Payer: Medicare Other | Source: Ambulatory Visit | Attending: Internal Medicine | Admitting: Internal Medicine

## 2015-12-15 DIAGNOSIS — J849 Interstitial pulmonary disease, unspecified: Secondary | ICD-10-CM | POA: Diagnosis not present

## 2015-12-29 ENCOUNTER — Telehealth: Payer: Self-pay | Admitting: Internal Medicine

## 2015-12-29 NOTE — Telephone Encounter (Signed)
D/w patient   CT chest 12/15/15 - possible UIP. Surgical lung bx indicated. Spoke about this aptient. She has inguinal hernia problems and wants to address this first. Wants to defer surgical lung bx  Plan  - full PFT in 2 months or son - ROV with me after full PFT  Dr. Brand Males, M.D., [facility].C.P Pulmonary and Critical Care Medicine Staff Physician [place] [facility] Pager: [phone], If no answer or between  15:00h - 7:00h: call [phone]  12/29/2015 1:41 PM

## 2015-12-30 NOTE — Telephone Encounter (Signed)
Called and spoke to pt. Informed her of the recs per MR. ROV and PFT scheduled for 4/21. Pt verbalized understanding and denied any further questions or concerns at this time.

## 2016-01-13 ENCOUNTER — Other Ambulatory Visit: Payer: Self-pay | Admitting: General Surgery

## 2016-01-13 DIAGNOSIS — R1900 Intra-abdominal and pelvic swelling, mass and lump, unspecified site: Secondary | ICD-10-CM

## 2016-01-27 ENCOUNTER — Other Ambulatory Visit: Payer: Self-pay | Admitting: General Surgery

## 2016-01-27 DIAGNOSIS — R1900 Intra-abdominal and pelvic swelling, mass and lump, unspecified site: Secondary | ICD-10-CM

## 2016-01-28 ENCOUNTER — Ambulatory Visit
Admission: RE | Admit: 2016-01-28 | Discharge: 2016-01-28 | Disposition: A | Discharge status: Home / Self Care | Payer: Medicare Other | Source: Ambulatory Visit | Attending: General Surgery | Admitting: General Surgery

## 2016-01-28 DIAGNOSIS — R1900 Intra-abdominal and pelvic swelling, mass and lump, unspecified site: Secondary | ICD-10-CM

## 2016-01-28 MED ORDER — IOPAMIDOL (ISOVUE-300) INJECTION 61%
75.0000 mL | Freq: Once | INTRAVENOUS | Status: AC | PRN
  Administered 2016-01-28: 75 mL via INTRAVENOUS

## 2016-02-19 ENCOUNTER — Telehealth: Payer: Self-pay | Admitting: Internal Medicine

## 2016-02-19 NOTE — Telephone Encounter (Signed)
Pt aware that I will send message to MR to advise. Pt is aware MR back in the office on April 18,2017.

## 2016-02-28 NOTE — Telephone Encounter (Signed)
No need for CT chest because even in March 2017 CT abdomen - lung cut - ILD is still present. I can look at PFT during OV and decide on repeat CT v biopsy rec

## 2016-02-29 NOTE — Telephone Encounter (Signed)
Called spoke with patient and discussed MR's recs as stated below Pt voiced her understanding and denied any questions/concerns She will keep her 4.21.17 appts Nothing further needed; will sign off

## 2016-02-29 NOTE — Telephone Encounter (Signed)
Spoke with patient, aware of rec's per MR. Nothing further needed.

## 2016-03-03 ENCOUNTER — Other Ambulatory Visit: Payer: Self-pay | Admitting: Internal Medicine

## 2016-03-03 DIAGNOSIS — R0600 Dyspnea, unspecified: Secondary | ICD-10-CM

## 2016-03-04 ENCOUNTER — Ambulatory Visit (INDEPENDENT_AMBULATORY_CARE_PROVIDER_SITE_OTHER): Payer: Medicare Other | Admitting: Internal Medicine

## 2016-03-04 ENCOUNTER — Encounter: Payer: Self-pay | Admitting: Internal Medicine

## 2016-03-04 VITALS — BP 132/50 | HR 75 | Ht 67.5 in | Wt 201.0 lb

## 2016-03-04 DIAGNOSIS — J441 Chronic obstructive pulmonary disease with (acute) exacerbation: Secondary | ICD-10-CM

## 2016-03-04 DIAGNOSIS — R0600 Dyspnea, unspecified: Secondary | ICD-10-CM

## 2016-03-04 DIAGNOSIS — J849 Interstitial pulmonary disease, unspecified: Secondary | ICD-10-CM | POA: Diagnosis not present

## 2016-03-04 LAB — PULMONARY FUNCTION TEST
DL/VA % pred: 66 %
DL/VA: 3.48 ml/min/mmHg/L
DLCO COR % PRED: 52 %
DLCO COR: 15.2 ml/min/mmHg
DLCO unc % pred: 50 %
DLCO unc: 14.76 ml/min/mmHg
FEF 25-75 POST: 1.9 L/s
FEF 25-75 Pre: 1.53 L/sec
FEF2575-%Change-Post: 24 %
FEF2575-%PRED-POST: 86 %
FEF2575-%PRED-PRE: 69 %
FEV1-%CHANGE-POST: 4 %
FEV1-%Pred-Post: 81 %
FEV1-%Pred-Pre: 78 %
FEV1-POST: 2.2 L
FEV1-Pre: 2.1 L
FEV1FVC-%Change-Post: 2 %
FEV1FVC-%PRED-PRE: 98 %
FEV6-%Change-Post: 2 %
FEV6-%Pred-Post: 84 %
FEV6-%Pred-Pre: 82 %
FEV6-POST: 2.84 L
FEV6-Pre: 2.79 L
FEV6FVC-%Change-Post: 0 %
FEV6FVC-%PRED-POST: 104 %
FEV6FVC-%Pred-Pre: 103 %
FVC-%CHANGE-POST: 1 %
FVC-%PRED-POST: 81 %
FVC-%PRED-PRE: 79 %
FVC-POST: 2.84 L
FVC-PRE: 2.79 L
PRE FEV1/FVC RATIO: 75 %
PRE FEV6/FVC RATIO: 100 %
Post FEV1/FVC ratio: 77 %
Post FEV6/FVC ratio: 100 %
RV % pred: 84 %
RV: 1.95 L
TLC % PRED: 86 %
TLC: 4.85 L

## 2016-03-04 MED ORDER — DOXYCYCLINE HYCLATE 100 MG PO TABS: ORAL_TABLET | ORAL | Status: DC

## 2016-03-04 MED ORDER — PREDNISONE 10 MG PO TABS: ORAL_TABLET | ORAL | Status: DC

## 2016-03-04 NOTE — Progress Notes (Signed)
PFT done today. 

## 2016-03-04 NOTE — Progress Notes (Signed)
Subjective:     Patient ID: Angela [name], female   DOB: [birth year], 70 y.o.   MRN: [id number]  HPI  OV 12/11/2015  Chief Complaint  Patient presents with  . Follow-up    Pt states her breathing has improved since last OV, pt states she still has good days and bad days. Pt states she has noticed improvement in breathing since stopping the symbicort and starting the spiriva. Pt c/o mild prod cough with yellow mucus and states periodically she has pain under left breast at times when she coughs.     Follow-up dyspnea secondary to obesity, emphysema with alpha-1 MZ phenotype and interstitial lung disease not otherwise specified.   Last seen October 2016. At that time he stopped Symbicort. Added Spiriva. Treated her for COPD exacerbation. These measures have helped. Dyspnea is improved but she still does have dyspnea on exertion relieved by rest. It is mild-moderate in intensity. There was concern about associated interstitial lung disease based on CT chest. We performed our extensive autoimmune profile but this is been negative.   OV 03/04/2016 Chief Complaint  Patient presents with  . Follow-up    Pt states her breathing has worsened since last OV. Pt c/o midsternal CP when lying down. Pt c/o non prod cough with chest congestion.     Follow-up emphysema with alpha-1 MZ phenotype and interstitial lung disease idiopathic interstitial pneumonia IIP  Last seen January 2017. At that time I did a high-resolution CT chest 12/15/2015. It was indicative of possible UIP. In the presence of emphysema and age over 54 and negative autoimmune test this is a higher pretest probably for IPF. Nevertheless per ATS criteria biopsies indicated. She wanted to defer biopsy until she saw surgeon for ventral hernia repair. She did see Dr. Jackolyn Confer and she tells me that he does not want to do surgery because of lung disease. Currently hernia is under control. She did have a CT abdomen lung cut 01/28/2016 that  still shows the presence of ILD. She is somewhat more open to having a surgical lung biopsy at this visit. She did tell me that between 1997 and 2007 she lived in a house with full of mold. She specifically uses the word "black mold" an Aspergillus. She says during this and she was hospitalized with acute hypoxemic respiratory failure with pulse ox in the 40s. However this was all 10 years ago. Records from these are not available at this point.  For the last 6 weeks she is reporting slight increase in cough associated with yellow sputum and some increase in wheezing and chest tightness and shortness of breath. This is associated with exposure to sick contacts in the facility she lives. She says a lot of people there and the apartment complex got hospitalized for pneumonia.       has a past medical history of Diabetes mellitus; Hypertension; Depression; DDD (degenerative disc disease); Fibromyalgia; COPD (chronic obstructive pulmonary disease) ([place]); Sleep apnea; Diverticulitis; Diverticulosis; Esophageal dysmotility (03/01/06); Anemia (2014); Complication of anesthesia; Chronic kidney disease; and Shingles.   reports that she quit smoking about 3 years ago. Her smoking use included Cigarettes. She has a 22 pack-year smoking history. She has never used smokeless tobacco.  Past Surgical History  Procedure Laterality Date  . Appendectomy    . Tonsillectomy    . Gastric fundoplication    . Back surgery    . Hernia repair    . Knee arthroscopy      left   .  De quervain's release      right  . Anterior fusion cervical spine    . Abdominal hysterectomy    . Posterior cervical fusion/foraminotomy N/A 03/16/2015    Procedure: POSTERIOR CERVICAL LAMINECTOMY AND DISKECTOMOY AT CERVICAL SEVEN-THORACIC ONE ;POSTERIOR CERVICAL FUSION CERVICAL SIX TO THORACIC ONE;  Surgeon: Kary Kos, MD;  Location: [facility];  Service: Neurosurgery;  Laterality: N/A;  Site is Cervical/Thoracic    Allergies  Allergen  Reactions  . Codeine Nausea And Vomiting  . Iohexol      Desc: Pt states several years ago during a CT scan w/ iv cotnrast she had severe nausea and vomiting, sob w/ difficulty breathing and swallowing.  She had full 13hr. premeds today (5/14/7) and did fine w/o complications.   . Tizanidine Other (See Comments)    Severe decrease in BP   . Ciprofloxacin Rash    Immunization History  Administered Date(s) Administered  . Pneumococcal-Unspecified 11/14/2012    Family History  Problem Relation Age of Onset  . COPD Father   . Colon cancer Maternal Aunt   . Stomach cancer Maternal Grandfather      Current outpatient prescriptions:  .  ACCU-CHEK AVIVA PLUS test strip, TEST BLOOD GLUCOSE ONCE D, Disp: , Rfl: 1 .  albuterol (PROVENTIL) (2.5 MG/3ML) 0.083% nebulizer solution, INAHLE CONTENTS OF ONE VIAL VIA NEBULIZER EVERY 6 HOURS AS NEEDED FOR WHEEZING OR SHORTNESS OF BREATH, Disp: 900 mL, Rfl: 1 .  atorvastatin (LIPITOR) 40 MG tablet, Take 40 mg by mouth daily. , Disp: , Rfl: 2 .  B Complex-C (SUPER B-C PO), Take 1 tablet by mouth daily at 12 noon. , Disp: , Rfl:  .  BILBERRY, VACCINIUM MYRTILLUS, PO, Take by mouth daily., Disp: , Rfl:  .  buPROPion (WELLBUTRIN XL) 150 MG 24 hr tablet, Take 300 mg by mouth daily. , Disp: , Rfl:  .  cyclobenzaprine (FLEXERIL) 10 MG tablet, Take 10 mg by mouth at bedtime as needed for muscle spasms. , Disp: , Rfl: 0 .  fluticasone (FLONASE) 50 MCG/ACT nasal spray, Place 2 sprays into both nostrils daily as needed for allergies. , Disp: , Rfl: 4 .  losartan (COZAAR) 25 MG tablet, TAKE 1 TABLET(25 MG) BY MOUTH DAILY, Disp: 90 tablet, Rfl: 1 .  metFORMIN (GLUCOPHAGE) 500 MG tablet, Take 500 mg by mouth 2 (two) times daily with a meal., Disp: , Rfl:  .  metoprolol succinate (TOPROL-XL) 25 MG 24 hr tablet, TAKE 1/2 TABLET BY MOUTH DAILY, Disp: 90 tablet, Rfl: 0 .  ondansetron (ZOFRAN ODT) 4 MG disintegrating tablet, Take 1 tablet (4 mg total) by mouth every 8  (eight) hours as needed for nausea or vomiting., Disp: 10 tablet, Rfl: 0 .  promethazine (PHENERGAN) 25 MG tablet, Take 25 mg by mouth every 6 (six) hours as needed for nausea or vomiting., Disp: , Rfl:  .  sertraline (ZOLOFT) 50 MG tablet, Take 50 mg by mouth daily., Disp: , Rfl:  .  tiotropium (SPIRIVA) 18 MCG inhalation capsule, Place 1 capsule (18 mcg total) into inhaler and inhale daily., Disp: 30 capsule, Rfl: 12 .  traMADol (ULTRAM) 50 MG tablet, Take 50 mg by mouth daily as needed for moderate pain or severe pain. , Disp: , Rfl: 0 .  vitamin B-12 (CYANOCOBALAMIN) 500 MCG tablet, Take 500 mcg by mouth daily., Disp: , Rfl:     Review of Systems     Objective:   Physical Exam  Constitutional: She is oriented to person,  place, and time. She appears well-developed and well-nourished. No distress.  HENT:  Head: Normocephalic and atraumatic.  Right Ear: External ear normal.  Left Ear: External ear normal.  Mouth/Throat: Oropharynx is clear and moist. No oropharyngeal exudate.  Eyes: Conjunctivae and EOM are normal. Pupils are equal, round, and reactive to light. Right eye exhibits no discharge. Left eye exhibits no discharge. No scleral icterus.  Neck: Normal range of motion. Neck supple. No JVD present. No tracheal deviation present. No thyromegaly present.  Cardiovascular: Normal rate, regular rhythm, normal heart sounds and intact distal pulses.  Exam reveals no gallop and no friction rub.   No murmur heard. Pulmonary/Chest: Effort normal. No respiratory distress. She has no wheezes. She has rales. She exhibits no tenderness.  Very fain basal crackles  Abdominal: Soft. Bowel sounds are normal. She exhibits no distension and no mass. There is no tenderness. There is no rebound and no guarding.  Old surgical scar +  Musculoskeletal: Normal range of motion. She exhibits no edema or tenderness.  Lymphadenopathy:    She has no cervical adenopathy.  Neurological: She is alert and  oriented to person, place, and time. She has normal reflexes. No cranial nerve deficit. She exhibits normal muscle tone. Coordination normal.  Skin: Skin is warm and dry. No rash noted. She is not diaphoretic. No erythema. No pallor.  Psychiatric: She has a normal mood and affect. Her behavior is normal. Judgment and thought content normal.  Vitals reviewed.  Filed Vitals:   03/04/16 1455  BP: 132/50  Pulse: 75  Height: 5' 7.5" (1.715 m)  Weight: 201 lb (91.173 kg)  SpO2: 93%        Assessment:       ICD-9-CM ICD-10-CM   1. COPD exacerbation ([place]) 491.21 J44.1   2. ILD (interstitial lung disease) ([place]) 515 J84.9 Ambulatory referral to Cardiothoracic Surgery       Plan:     #COPD exacerbation - History is consistent with COPD exacerbation. Plan is to continue Spiriva but also treated with short course antibody sent prednisone.  #Interstitial lung disease - Higher pretest probably for IPF but CT is nondiagnostic. In addition she had mold exposure history with what sounds like acute episode of hypoxemic respiratory failure which could've been acute hypersensitivity pneumonitis. Therefore hypersensitive pneumonitis is in the differential. Therefore she has undifferentiated ILD. Therefore she meets indication for surgical lung biopsy. We will refer her to Dr. Roxan Hockey for evaluation of VATS surgical lung biopsy. She is agreeable with the plan   > 50% of this > 25 min visit spent in face to face counseling or coordination of care    Dr. Brand Males, M.D., [facility].C.P Pulmonary and Critical Care Medicine Staff Physician [place] [facility] Pager: [phone], If no answer or between  15:00h - 7:00h: call [phone]  03/04/2016 3:47 PM

## 2016-03-04 NOTE — Patient Instructions (Signed)
ICD-9-CM ICD-10-CM   1. COPD exacerbation ([place]) 491.21 J44.1   2. ILD (interstitial lung disease) ([facility]) 515 J84.9     #Copd flare up  - Please take prednisone 40 mg x1 day, then 30 mg x1 day, then 20 mg x1 day, then 10 mg x1 day, and then 5 mg x1 day and stop -  Take doxycycline 100mg  po twice daily x 5 days; take after meals and avoid sunlight  #ILD  - differential diagnosis is hypersensitivity pneumonitis versus IPF versus NSIP - Refer Dr. Roxan Hockey for surgical lung biopsy evaluation  #Follow-up - 4-6 weeks from now to see me

## 2016-03-10 ENCOUNTER — Telehealth: Payer: Self-pay | Admitting: Cardiology

## 2016-03-10 ENCOUNTER — Other Ambulatory Visit: Payer: Self-pay | Admitting: Cardiology

## 2016-03-10 ENCOUNTER — Other Ambulatory Visit: Payer: Self-pay | Admitting: *Deleted

## 2016-03-10 MED ORDER — METOPROLOL SUCCINATE ER 25 MG PO TB24: 25.0000 mg | ORAL_TABLET | Freq: Every day | ORAL | Status: DC

## 2016-03-10 NOTE — Telephone Encounter (Signed)
New Message:   *STAT* If patient is at the pharmacy, call can be transferred to refill team.   1. Which medications need to be refilled? (please list name of each medication and dose if known) Metoprolol (change dosage to 1 tab po daily)  2. Which pharmacy/location (including street and city if local pharmacy) is medication to be sent to?[facility] on [address] and [address]  3. Do they need a 30 day or 90 day supply? [place]

## 2016-03-10 NOTE — Telephone Encounter (Signed)
Rx sent in accordingly.

## 2016-03-10 NOTE — Telephone Encounter (Signed)
Call Documentation      Earvin Hansen at 05/20/2015 3:34 PM     Status: Signed       Expand All Collapse All   Advised patient of changes, verbalized understanding            Jerline Pain, MD at 05/20/2015 3:21 PM     Status: Signed       Expand All Collapse All   Increase metoprolol to 25mg  QD Candee Furbish, MD             Earvin Hansen at 05/19/2015 6:57 PM     Status: Signed       Expand All Collapse All   Patient states that at 2:00 pm today her blood pressure 185/80 HR 67. Patient states she took the other half of Metoprolol and it brought her blood pressure down to 151/70  Patient concerned since blood pressure seems to be all over the place Last muscle relaxer on Sunday Will forward to Dr Marlou Porch for review

## 2016-03-15 ENCOUNTER — Other Ambulatory Visit: Payer: Self-pay | Admitting: *Deleted

## 2016-03-15 ENCOUNTER — Encounter: Payer: Self-pay | Admitting: Thoracic Surgery (Cardiothoracic Vascular Surgery)

## 2016-03-15 ENCOUNTER — Institutional Professional Consult (permissible substitution) (INDEPENDENT_AMBULATORY_CARE_PROVIDER_SITE_OTHER): Payer: Medicare Other | Admitting: Thoracic Surgery (Cardiothoracic Vascular Surgery)

## 2016-03-15 VITALS — BP 126/66 | HR 64 | Resp 16 | Ht 67.0 in | Wt 199.0 lb

## 2016-03-15 DIAGNOSIS — J849 Interstitial pulmonary disease, unspecified: Secondary | ICD-10-CM

## 2016-03-15 DIAGNOSIS — R911 Solitary pulmonary nodule: Secondary | ICD-10-CM

## 2016-03-15 NOTE — Progress Notes (Signed)
PCP is WEBB, Valla Leaver, MD Referring Provider is Brand Males, MD  Chief Complaint  Patient presents with  . Shortness of Breath    eval for lung biopsy.Marland KitchenMarland KitchenCT CHEST    HPI: Angela [name] is sent for consultation for lung biopsy for interstitial lung disease.   Angela [name] is a 70 year old [name] with a past history significant for a remote tobacco abuse (quit 3 years ago), Type 2 diabetes, COPD, interstitial lung disease, obstructive sleep apnea, anemia, fibromyalgia, degenerative disc disease, hypertension, and esophageal dysmotility. She also reports being exposed to "black mold" and Aspergillus in an apartment where she used to live.  She has been followed by Dr. Chase Caller for her COPD, alpha-1 MZ phenotype and interstitial lung disease. She had a high-resolution CT in January which was indicative of possible UIP. He recommended a lung biopsy, but she wanted to defer that until after she had an abdominal ventral hernia repair. Dr. Zella Richer felt that surgery was not a good idea due to the severity of her lung disease.   She saw Dr. Chase Caller again in April. She complained of a 6 week exacerbation with an increase in coughing, wheezing, chest tightness and shortness of breath. She was more open to the idea of a lung biopsy.  She does have obstructive sleep apnea and uses sleep apnea night. She does have a history of childhood asthma. She has a chronic dry cough. It was recently more productive but that is improving. She did have wheezing back in April, but is not having any currently.  Past Medical History  Diagnosis Date  . Diabetes mellitus   . Hypertension   . Depression     with anxiety  . DDD (degenerative disc disease)   . Fibromyalgia   . COPD (chronic obstructive pulmonary disease) ([place])   . Sleep apnea     wears CPAP  . Diverticulitis   . Diverticulosis   . Esophageal dysmotility 03/01/06    mild  . Anemia 2014    mild  . Complication of anesthesia     passes out 24 hrs  after getting  . Chronic kidney disease     early stages of some insufficiency  . Shingles     Past Surgical History  Procedure Laterality Date  . Appendectomy    . Tonsillectomy    . Gastric fundoplication    . Back surgery    . Hernia repair    . Knee arthroscopy      left   . De quervain's release      right  . Anterior fusion cervical spine    . Abdominal hysterectomy    . Posterior cervical fusion/foraminotomy N/A 03/16/2015    Procedure: POSTERIOR CERVICAL LAMINECTOMY AND DISKECTOMOY AT CERVICAL SEVEN-THORACIC ONE ;POSTERIOR CERVICAL FUSION CERVICAL SIX TO THORACIC ONE;  Surgeon: Kary Kos, MD;  Location: [facility];  Service: Neurosurgery;  Laterality: N/A;  Site is Cervical/Thoracic    Family History  Problem Relation Age of Onset  . COPD Father   . Colon cancer Maternal Aunt   . Stomach cancer Maternal Grandfather     Social History Social History  Substance Use Topics  . Smoking status: Former Smoker -- 1.00 packs/day for 22 years    Types: Cigarettes    Quit date: 04/14/2012  . Smokeless tobacco: Never Used     Comment: Quit 1 year ago in June 2013.    Marland Kitchen Alcohol Use: No    Current Outpatient Prescriptions  Medication Sig Dispense Refill  .  ACCU-CHEK AVIVA PLUS test strip TEST BLOOD GLUCOSE ONCE D  1  . albuterol (PROVENTIL) (2.5 MG/3ML) 0.083% nebulizer solution INAHLE CONTENTS OF ONE VIAL VIA NEBULIZER EVERY 6 HOURS AS NEEDED FOR WHEEZING OR SHORTNESS OF BREATH 900 mL 1  . atorvastatin (LIPITOR) 40 MG tablet Take 40 mg by mouth daily.   2  . B Complex-C (SUPER B-C PO) Take 1 tablet by mouth daily at 12 noon.     Marland Kitchen BILBERRY, VACCINIUM MYRTILLUS, PO Take by mouth daily.    Marland Kitchen buPROPion (WELLBUTRIN XL) 150 MG 24 hr tablet Take 300 mg by mouth daily.     . cyclobenzaprine (FLEXERIL) 10 MG tablet Take 10 mg by mouth at bedtime as needed for muscle spasms.   0  . fluticasone (FLONASE) 50 MCG/ACT nasal spray Place 2 sprays into both nostrils daily as needed for  allergies.   4  . losartan (COZAAR) 25 MG tablet TAKE 1 TABLET(25 MG) BY MOUTH DAILY 90 tablet 1  . metFORMIN (GLUCOPHAGE) 500 MG tablet Take 500 mg by mouth 2 (two) times daily with a meal.    . metoprolol succinate (TOPROL-XL) 25 MG 24 hr tablet Take 1 tablet (25 mg total) by mouth daily. Please call and schedule an appointment 30 tablet 0  . ondansetron (ZOFRAN ODT) 4 MG disintegrating tablet Take 1 tablet (4 mg total) by mouth every 8 (eight) hours as needed for nausea or vomiting. 10 tablet 0  . promethazine (PHENERGAN) 25 MG tablet Take 25 mg by mouth every 6 (six) hours as needed for nausea or vomiting.    . sertraline (ZOLOFT) 50 MG tablet Take 50 mg by mouth daily.    Marland Kitchen tiotropium (SPIRIVA) 18 MCG inhalation capsule Place 1 capsule (18 mcg total) into inhaler and inhale daily. 30 capsule 12  . traMADol (ULTRAM) 50 MG tablet Take 50 mg by mouth daily as needed for moderate pain or severe pain.   0  . vitamin B-12 (CYANOCOBALAMIN) 500 MCG tablet Take 500 mcg by mouth daily.     No current facility-administered medications for this visit.    Allergies  Allergen Reactions  . Codeine Nausea And Vomiting  . Iohexol      Desc: Pt states several years ago during a CT scan w/ iv cotnrast she had severe nausea and vomiting, sob w/ difficulty breathing and swallowing.  She had full 13hr. premeds today (5/14/7) and did fine w/o complications.   . Tizanidine Other (See Comments)    Severe decrease in BP   . Ciprofloxacin Rash    Review of Systems  Constitutional: Positive for unexpected weight change (Has lost 12 pounds in 3 months). Negative for activity change, appetite change and fatigue.  HENT: Negative for trouble swallowing and voice change.   Eyes:       Bilateral cataract surgery  Respiratory: Positive for apnea (uses CPAP at night), cough (Nonproductive) and shortness of breath. Negative for chest tightness and wheezing.   Cardiovascular: Negative for chest pain and leg swelling.   Gastrointestinal: Negative for abdominal pain and blood in stool.  Endocrine: Negative for polydipsia and polyphagia.  Genitourinary: Positive for frequency. Negative for difficulty urinating.  Musculoskeletal: Positive for joint swelling, arthralgias, neck pain and neck stiffness.  Neurological: Negative for syncope and weakness.       History of TIAs characterized by facial droop in 2010. No residual  Hematological: Positive for adenopathy (On CT scan). Bruises/bleeds easily.    BP 126/66 mmHg  Pulse 64  Resp 16  Ht 5\' 7"  (1.702 m)  Wt 199 lb (90.266 kg)  BMI 31.16 kg/m2  SpO2 98% Physical Exam  Constitutional: She is oriented to person, place, and time. She appears well-nourished. No distress.  Obese  HENT:  Head: Normocephalic and atraumatic.  Eyes: Conjunctivae and EOM are normal. No scleral icterus.  Neck: No tracheal deviation present. No thyromegaly present.  ROM slightly limited in extension. Well-healed scar posteriorly  Cardiovascular: Normal rate, regular rhythm, normal heart sounds and intact distal pulses.  Exam reveals no gallop and no friction rub.   No murmur heard. Pulmonary/Chest: Effort normal. No respiratory distress. She has no wheezes. She has rales (Faint bibasilar).  Abdominal: Soft. She exhibits no distension. There is no tenderness. There is no rebound.  Musculoskeletal: Normal range of motion. She exhibits no edema.  Lymphadenopathy:    She has no cervical adenopathy.  Neurological: She is alert and oriented to person, place, and time. No cranial nerve deficit. She exhibits normal muscle tone.  Skin: Skin is warm and dry. No rash noted.  Vitals reviewed.    Diagnostic Tests: ADDENDUM REPORT: 12/16/2015 11:56 ADDENDUM: At the request of Dr. Chase Caller, pulmonary parenchymal fibrosis pattern is reviewed per ATS criteria for UIP. Findings are indicative of possible UIP. Electronically Signed  By: Lorin Picket M.D.  On: 12/16/2015 11:56      Study Result     CLINICAL DATA: Follow-up interstitial lung disease. Chronic shortness of breath. Former smoker.  EXAM: CT CHEST WITHOUT CONTRAST  TECHNIQUE: Multidetector CT imaging of the chest was performed following the standard protocol without intravenous contrast. High resolution imaging of the lungs, as well as inspiratory and expiratory imaging, was performed.  COMPARISON: 08/18/2015 high-resolution chest CT.  FINDINGS: Mediastinum/Nodes: Normal heart size. No pericardial fluid/thickening. Left anterior descending and right coronary atherosclerosis. Great vessels are normal in course and caliber. Normal visualized thyroid. Normal esophagus. Surgical clips are again noted near the esophagogastric junction, probably from prior Nissen fundoplication. No axillary adenopathy. Increased mild right paratracheal lymphadenopathy, largest 1.6 cm (series 4/ image 22), previously 1.4 cm. New mildly enlarged 1.1 cm subcarinal node (series 4/ image 29). New mildly enlarged 1.1 cm aortopulmonary window node (series 4/image 22) . Suggestion of new mild bilateral hilar adenopathy, poorly delineated on this noncontrast study.  Lungs/Pleura: No pneumothorax. No pleural effusion. Mild-to-moderate upper lung predominant centrilobular, paraseptal and bullous emphysema. Subpleural 6 mm solid pulmonary nodule in the anterior left lower lobe abutting the left major fissure (series 5/image 28), increased from 3 mm on 08/18/2015. No acute consolidative airspace disease or additional significant pulmonary nodules. There are patchy basilar predominant regions of subpleural reticulation and traction bronchiectasis in both lungs, mildly increased since 08/18/2015. There are small patchy regions of honeycombing in the posterior lower lobes bilaterally, which appear slightly increased since 08/18/2015. No significant air trapping on the expiration sequence.  Upper abdomen:  Unremarkable.  Musculoskeletal: No aggressive appearing focal osseous lesions. Mild-to-moderate degenerative changes in the thoracic spine.  IMPRESSION: 1. Mild interval progression of patchy basilar predominant regions of subpleural reticulation, traction bronchiectasis and mild honeycombing, most suggestive of usual interstitial pneumonia (UIP). 2. Interval growth of subpleural 6 mm solid left lower lobe pulmonary nodule abutting the left major fissure. 3. A follow-up high-resolution chest CT is advised in 3-6 months to reassess the pulmonary nodule and interstitial lung disease. 4. Mild mediastinal and bilateral hilar lymphadenopathy, increased, nonspecific. 5. Two-vessel coronary atherosclerosis. 6. Mild to moderate emphysema.  Electronically Signed: By: Rinaldo Ratel  Poff M.D. On: 12/15/2015 15:15   I personally reviewed the CT chest and concur with the findings as noted above.  Impression: Angela [name] with multiple medical issues including hypertension, fibromyalgia, dyslipidemia, COPD with mixed phenotype alpha 1 antitrypsin deficiency, interstitial lung disease, cervical disc disease requiring surgery, a ventral hernia, obesity, and remote tobacco abuse. She has interstitial lung disease of unknown etiology and needs a lung biopsy for diagnostic purposes.   In addition to emphysema and interstitial lung disease, her CT also shows a 6 mm subpleural nodule along the major fissure in the left lower lobe and mediastinal adenopathy.  I discussed the proposed operation with her. We will plan to do a left VATS, lung biopsy, and lymph node biopsy. The AP window node should be easily accessible. She asked whether we plan to take the 6 mm subpleural nodule. Given the time interval since her last scan which is now almost 3 months I think the scan needs to be repeated prior to surgery and if there is any evidence of growth we would definitely want to remove the subpleural nodule.  Otherwise, it would depend on how difficult that area is to access the time of surgery. She understands the operation would be done under general anesthesia, the incisions to be used, the use of a drainage tube postoperatively, the expected hospital stay, and the overall recovery. She understands this is diagnostic and not therapeutic. I reviewed the indications, risks, benefits, and alternatives. She understands the risks include, but are not limited to death, MI, DVT, PE, bleeding, possible need for transfusion, infection, prolonged air leak, cardiac arrhythmias, respiratory failure, as well as the possibility of other unforeseeable complications.  She accepts the risks and wishes to proceed.  I did the discuss the possibility of a clinical trial which involves bronchoscopy for biopsy and bronchoalveolar lavage. She was not interested in learning more about that.  She wants to talk to her son about the timing of surgery before deciding.  Plan: Repeat CT chest  Left VATS, lung biopsy, lymph node biopsy once patient decides on a date.  Melrose Nakayama, MD [facility] [phone]

## 2016-03-16 ENCOUNTER — Other Ambulatory Visit: Payer: Self-pay | Admitting: *Deleted

## 2016-03-16 DIAGNOSIS — R911 Solitary pulmonary nodule: Secondary | ICD-10-CM

## 2016-03-16 DIAGNOSIS — J849 Interstitial pulmonary disease, unspecified: Secondary | ICD-10-CM

## 2016-03-18 ENCOUNTER — Other Ambulatory Visit (HOSPITAL_COMMUNITY): Payer: Self-pay | Admitting: *Deleted

## 2016-03-18 NOTE — Pre-Procedure Instructions (Signed)
Angela [name]  03/18/2016      [facility] [place], [place] - [address] [place] [place] [address] Phone: [phone] Fax: [facility] [address] - [place], [address] AT [facility] [address] [place] [address] Phone: [phone] Fax: [phone]    Your procedure is scheduled on 03-23-2016   Wednesday  .  Report to [facility] Admitting at 6:30A.M.   Call this number if you have problems the morning of surgery:  [phone]   Remember:  Do not eat food or drink liquids after midnight.   Take these medicines the morning of surgery with A SIP OF WATERAlbuterol nebulizer if needed,atorvastatin(Lipitor),Bupropion(Wellbutrin),Flonase nasal spray if needed,metoprolol(Toprol) ,Sertraline(Zoloft),Spiriva,tramadol(Ultram) if needed            Stop NSAIDS, vitamins/herbal medicines today.   How to Manage Your Diabetes Before and After Surgery  Why is it important to control my blood sugar before and after surgery? . Improving blood sugar levels before and after surgery helps healing and can limit problems. . A way of improving blood sugar control is eating a healthy diet by: o  Eating less sugar and carbohydrates o  Increasing activity/exercise o  Talking with your doctor about reaching your blood sugar goals . High blood sugars (greater than 180 mg/dL) can raise your risk of infections and slow your recovery, so you will need to focus on controlling your diabetes during the weeks before surgery. . Make sure that the doctor who takes care of your diabetes knows about your planned surgery including the date and location.  How do I manage my blood sugar before surgery? . Check your blood sugar at least 4 times a day, starting 2 days before surgery, to make sure that the level is not too high or low. o Check your blood sugar the morning of your surgery  when you wake up and every 2 hours until you get to the Short Stay unit. . If your blood sugar is less than 70 mg/dL, you will need to treat for low blood sugar: o Do not take insulin. o Treat a low blood sugar (less than 70 mg/dL) with  cup of clear juice (cranberry or apple), 4 glucose tablets, OR glucose gel. o Recheck blood sugar in 15 minutes after treatment (to make sure it is greater than 70 mg/dL). If your blood sugar is not greater than 70 mg/dL on recheck, call [phone] for further instructions. . Report your blood sugar to the short stay nurse when you get to Short Stay.  . If you are admitted to the hospital after surgery: o Your blood sugar will be checked by the staff and you will probably be given insulin after surgery (instead of oral diabetes medicines) to make sure you have good blood sugar levels. o The goal for blood sugar control after surgery is 80-180 mg/dL.              WHAT DO I DO ABOUT MY DIABETES MEDICATION?   Marland Kitchen Do not take oral diabetes medicines (pills) the morning of surgery.          Other Instructions:          Patient Signature:  Date:   Nurse Signature:  Date:   Reviewed and Endorsed by [facility] Patient Education Committee, August 2015   Do not wear jewelry, make-up or nail polish.  Do not wear lotions,  powders, or perfumes.  You may not wear deodorant.  Do not shave 48 hours prior to surgery.  .  Do not bring valuables to the hospital.  [facility] is not responsible for any belongings or valuables.  Contacts, dentures or bridgework may not be worn into surgery.  Leave your suitcase in the car.  After surgery it may be brought to your room.  For patients admitted to the hospital, discharge time will be determined by your treatment team.  Patients discharged the day of surgery will not be allowed to drive home.    Special instructions:  See attached Sheet for instructions on  CHG showers  Please read over the  following fact sheets that you were given. Pain Booklet, Coughing and Deep Breathing, Blood Transfusion Information and Surgical Site Infection Prevention

## 2016-03-21 ENCOUNTER — Encounter (HOSPITAL_COMMUNITY)
Admission: RE | Admit: 2016-03-21 | Discharge: 2016-03-21 | Disposition: A | Discharge status: Home / Self Care | Payer: Medicare Other | Source: Ambulatory Visit | Attending: Thoracic Surgery (Cardiothoracic Vascular Surgery) | Admitting: Thoracic Surgery (Cardiothoracic Vascular Surgery)

## 2016-03-21 ENCOUNTER — Other Ambulatory Visit: Payer: Self-pay

## 2016-03-21 ENCOUNTER — Encounter (HOSPITAL_COMMUNITY): Payer: Self-pay

## 2016-03-21 VITALS — BP 138/53 | HR 66 | Temp 98.0°F | Resp 18 | Ht 67.0 in | Wt 206.0 lb

## 2016-03-21 DIAGNOSIS — R911 Solitary pulmonary nodule: Secondary | ICD-10-CM

## 2016-03-21 DIAGNOSIS — J849 Interstitial pulmonary disease, unspecified: Secondary | ICD-10-CM

## 2016-03-21 HISTORY — DX: Genetic carrier of other disease: Z14.8

## 2016-03-21 HISTORY — DX: Reserved for inherently not codable concepts without codable children: IMO0001

## 2016-03-21 HISTORY — DX: Abnormal finding of blood chemistry, unspecified: R79.9

## 2016-03-21 HISTORY — DX: Cerebral infarction, unspecified: I63.9

## 2016-03-21 LAB — COMPREHENSIVE METABOLIC PANEL
ALK PHOS: 103 U/L (ref 38–126)
ALT: 24 U/L (ref 14–54)
AST: 22 U/L (ref 15–41)
Albumin: 3.8 g/dL (ref 3.5–5.0)
Anion gap: 11 (ref 5–15)
BILIRUBIN TOTAL: 0.5 mg/dL (ref 0.3–1.2)
BUN: 16 mg/dL (ref 6–20)
CHLORIDE: 108 mmol/L (ref 101–111)
CO2: 21 mmol/L — ABNORMAL LOW (ref 22–32)
CREATININE: 1.12 mg/dL — AB (ref 0.44–1.00)
Calcium: 9.5 mg/dL (ref 8.9–10.3)
GFR calc Af Amer: 58 mL/min — ABNORMAL LOW (ref >60)
GFR, EST NON AFRICAN AMERICAN: 50 mL/min — AB (ref >60)
Glucose, Bld: 106 mg/dL — ABNORMAL HIGH (ref 65–99)
Potassium: 4.4 mmol/L (ref 3.5–5.1)
Sodium: 140 mmol/L (ref 135–145)
Total Protein: 6.5 g/dL (ref 6.5–8.1)

## 2016-03-21 LAB — CBC
HEMATOCRIT: 32.4 % — AB (ref 36.0–46.0)
HEMOGLOBIN: 10.5 g/dL — AB (ref 12.0–15.0)
MCH: 28.8 pg (ref 26.0–34.0)
MCHC: 32.4 g/dL (ref 30.0–36.0)
MCV: 88.8 fL (ref 78.0–100.0)
PLATELETS: 231 10*3/uL (ref 150–400)
RBC: 3.65 MIL/uL — AB (ref 3.87–5.11)
RDW: 14.3 % (ref 11.5–15.5)
WBC: 8.4 10*3/uL (ref 4.0–10.5)

## 2016-03-21 LAB — SURGICAL PCR SCREEN
MRSA, PCR: NEGATIVE
Staphylococcus aureus: NEGATIVE

## 2016-03-21 LAB — BLOOD GAS, ARTERIAL
Acid-base deficit: 1.1 mmol/L (ref 0.0–2.0)
Bicarbonate: 22.9 mEq/L (ref 20.0–24.0)
Drawn by: 449841
FIO2: 0.21
O2 Saturation: 97 %
PCO2 ART: 36.5 mmHg (ref 35.0–45.0)
PH ART: 7.414 (ref 7.350–7.450)
Patient temperature: 98.6
TCO2: 24 mmol/L (ref 0–100)
pO2, Arterial: 92.5 mmHg (ref 80.0–100.0)

## 2016-03-21 LAB — GLUCOSE, CAPILLARY: Glucose-Capillary: 125 mg/dL — ABNORMAL HIGH (ref 65–99)

## 2016-03-21 LAB — TYPE AND SCREEN
ABO/RH(D): A POS
Antibody Screen: NEGATIVE

## 2016-03-21 LAB — URINALYSIS, ROUTINE W REFLEX MICROSCOPIC
Bilirubin Urine: NEGATIVE
Glucose, UA: NEGATIVE mg/dL
Hgb urine dipstick: NEGATIVE
KETONES UR: NEGATIVE mg/dL
LEUKOCYTES UA: NEGATIVE
NITRITE: NEGATIVE
PH: 7 (ref 5.0–8.0)
PROTEIN: NEGATIVE mg/dL
Specific Gravity, Urine: 1.015 (ref 1.005–1.030)

## 2016-03-21 LAB — PROTIME-INR
INR: 1.02 (ref 0.00–1.49)
PROTHROMBIN TIME: 13.6 s (ref 11.6–15.2)

## 2016-03-21 LAB — APTT: aPTT: 25 seconds (ref 24–37)

## 2016-03-21 NOTE — Progress Notes (Signed)
PCP: Dr. Maurice Small @[facility]   Jacklynn Ganong, she also manages her diabetes. Pt. States she doesn't check fasting blood sugars in am. Will request hgb A1C from MD office. Drawn last month.  Last visit with neurologist 2010.

## 2016-03-22 ENCOUNTER — Ambulatory Visit
Admission: RE | Admit: 2016-03-22 | Discharge: 2016-03-22 | Disposition: A | Discharge status: Home / Self Care | Payer: Medicare Other | Source: Ambulatory Visit | Attending: Thoracic Surgery (Cardiothoracic Vascular Surgery) | Admitting: Thoracic Surgery (Cardiothoracic Vascular Surgery)

## 2016-03-22 DIAGNOSIS — R911 Solitary pulmonary nodule: Secondary | ICD-10-CM

## 2016-03-23 ENCOUNTER — Inpatient Hospital Stay (HOSPITAL_COMMUNITY): Payer: Medicare Other | Admitting: Anesthesiology

## 2016-03-23 ENCOUNTER — Inpatient Hospital Stay (HOSPITAL_COMMUNITY): Payer: Medicare Other

## 2016-03-23 ENCOUNTER — Encounter (HOSPITAL_COMMUNITY): Payer: Self-pay | Admitting: *Deleted

## 2016-03-23 ENCOUNTER — Encounter (HOSPITAL_COMMUNITY)
Admission: RE | Disposition: A | Discharge status: Home / Self Care | Payer: Self-pay | Source: Ambulatory Visit | Attending: Thoracic Surgery (Cardiothoracic Vascular Surgery)

## 2016-03-23 ENCOUNTER — Inpatient Hospital Stay (HOSPITAL_COMMUNITY)
Admission: RE | Admit: 2016-03-23 | Discharge: 2016-03-26 | DRG: 167 | Disposition: A | Discharge status: Home / Self Care | Payer: Medicare Other | Source: Ambulatory Visit | Attending: Thoracic Surgery (Cardiothoracic Vascular Surgery) | Admitting: Thoracic Surgery (Cardiothoracic Vascular Surgery)

## 2016-03-23 DIAGNOSIS — K224 Dyskinesia of esophagus: Secondary | ICD-10-CM | POA: Diagnosis present

## 2016-03-23 DIAGNOSIS — J939 Pneumothorax, unspecified: Secondary | ICD-10-CM | POA: Diagnosis not present

## 2016-03-23 DIAGNOSIS — Z6832 Body mass index (BMI) 32.0-32.9, adult: Secondary | ICD-10-CM

## 2016-03-23 DIAGNOSIS — Z09 Encounter for follow-up examination after completed treatment for conditions other than malignant neoplasm: Secondary | ICD-10-CM

## 2016-03-23 DIAGNOSIS — J849 Interstitial pulmonary disease, unspecified: Secondary | ICD-10-CM

## 2016-03-23 DIAGNOSIS — Z825 Family history of asthma and other chronic lower respiratory diseases: Secondary | ICD-10-CM

## 2016-03-23 DIAGNOSIS — Z79899 Other long term (current) drug therapy: Secondary | ICD-10-CM

## 2016-03-23 DIAGNOSIS — E669 Obesity, unspecified: Secondary | ICD-10-CM | POA: Diagnosis present

## 2016-03-23 DIAGNOSIS — Z981 Arthrodesis status: Secondary | ICD-10-CM

## 2016-03-23 DIAGNOSIS — J84112 Idiopathic pulmonary fibrosis: Principal | ICD-10-CM | POA: Diagnosis present

## 2016-03-23 DIAGNOSIS — G4733 Obstructive sleep apnea (adult) (pediatric): Secondary | ICD-10-CM | POA: Diagnosis present

## 2016-03-23 DIAGNOSIS — Z888 Allergy status to other drugs, medicaments and biological substances status: Secondary | ICD-10-CM

## 2016-03-23 DIAGNOSIS — I129 Hypertensive chronic kidney disease with stage 1 through stage 4 chronic kidney disease, or unspecified chronic kidney disease: Secondary | ICD-10-CM | POA: Diagnosis present

## 2016-03-23 DIAGNOSIS — Z91041 Radiographic dye allergy status: Secondary | ICD-10-CM | POA: Diagnosis not present

## 2016-03-23 DIAGNOSIS — E785 Hyperlipidemia, unspecified: Secondary | ICD-10-CM | POA: Diagnosis present

## 2016-03-23 DIAGNOSIS — Z881 Allergy status to other antibiotic agents status: Secondary | ICD-10-CM | POA: Diagnosis not present

## 2016-03-23 DIAGNOSIS — M797 Fibromyalgia: Secondary | ICD-10-CM | POA: Diagnosis present

## 2016-03-23 DIAGNOSIS — Z7984 Long term (current) use of oral hypoglycemic drugs: Secondary | ICD-10-CM

## 2016-03-23 DIAGNOSIS — N189 Chronic kidney disease, unspecified: Secondary | ICD-10-CM | POA: Diagnosis present

## 2016-03-23 DIAGNOSIS — Z4682 Encounter for fitting and adjustment of non-vascular catheter: Secondary | ICD-10-CM

## 2016-03-23 DIAGNOSIS — D649 Anemia, unspecified: Secondary | ICD-10-CM | POA: Diagnosis present

## 2016-03-23 DIAGNOSIS — E1122 Type 2 diabetes mellitus with diabetic chronic kidney disease: Secondary | ICD-10-CM | POA: Diagnosis present

## 2016-03-23 DIAGNOSIS — J449 Chronic obstructive pulmonary disease, unspecified: Secondary | ICD-10-CM | POA: Diagnosis present

## 2016-03-23 DIAGNOSIS — Z885 Allergy status to narcotic agent status: Secondary | ICD-10-CM | POA: Diagnosis not present

## 2016-03-23 DIAGNOSIS — F419 Anxiety disorder, unspecified: Secondary | ICD-10-CM | POA: Diagnosis present

## 2016-03-23 DIAGNOSIS — Z87891 Personal history of nicotine dependence: Secondary | ICD-10-CM | POA: Diagnosis not present

## 2016-03-23 DIAGNOSIS — R911 Solitary pulmonary nodule: Secondary | ICD-10-CM | POA: Diagnosis present

## 2016-03-23 DIAGNOSIS — E8801 Alpha-1-antitrypsin deficiency: Secondary | ICD-10-CM | POA: Diagnosis present

## 2016-03-23 HISTORY — PX: LUNG BIOPSY: SHX5088

## 2016-03-23 HISTORY — PX: VIDEO ASSISTED THORACOSCOPY: SHX5073

## 2016-03-23 HISTORY — PX: LYMPH NODE BIOPSY: SHX201

## 2016-03-23 LAB — GLUCOSE, CAPILLARY
Glucose-Capillary: 95 mg/dL (ref 65–99)
Glucose-Capillary: 141 mg/dL — ABNORMAL HIGH (ref 65–99)
Glucose-Capillary: 125 mg/dL — ABNORMAL HIGH (ref 65–99)
Glucose-Capillary: 232 mg/dL — ABNORMAL HIGH (ref 65–99)

## 2016-03-23 SURGERY — VIDEO ASSISTED THORACOSCOPY
Anesthesia: General | Site: Chest | Laterality: Left

## 2016-03-23 MED ORDER — FENTANYL CITRATE (PF) 100 MCG/2ML IJ SOLN
INTRAMUSCULAR | Status: DC | PRN
  Administered 2016-03-23: 100 ug via INTRAVENOUS
  Administered 2016-03-23: 150 ug via INTRAVENOUS

## 2016-03-23 MED ORDER — ONDANSETRON HCL 4 MG/2ML IJ SOLN: 4.0000 mg | Freq: Four times a day (QID) | INTRAMUSCULAR | Status: DC | PRN

## 2016-03-23 MED ORDER — DEXTROSE 5 % IV SOLN
1.5000 g | INTRAVENOUS | Status: AC
  Administered 2016-03-23: 1.5 g via INTRAVENOUS

## 2016-03-23 MED ORDER — ONDANSETRON HCL 4 MG/2ML IJ SOLN
INTRAMUSCULAR | Status: DC | PRN
  Administered 2016-03-23: 4 mg via INTRAVENOUS

## 2016-03-23 MED ORDER — FENTANYL CITRATE (PF) 250 MCG/5ML IJ SOLN
INTRAMUSCULAR | Status: AC
  Filled 2016-03-23: qty 5

## 2016-03-23 MED ORDER — VITAMIN B-12 1000 MCG PO TABS
500.0000 ug | ORAL_TABLET | Freq: Every day | ORAL | Status: DC
  Administered 2016-03-23 – 2016-03-26 (×4): 500 ug via ORAL
  Filled 2016-03-23 (×5): qty 1

## 2016-03-23 MED ORDER — OXYCODONE HCL 5 MG PO TABS
ORAL_TABLET | ORAL | Status: AC
  Filled 2016-03-23: qty 2

## 2016-03-23 MED ORDER — PROCHLORPERAZINE EDISYLATE 5 MG/ML IJ SOLN: 10.0000 mg | INTRAMUSCULAR | Status: DC | PRN

## 2016-03-23 MED ORDER — ACETAMINOPHEN 160 MG/5ML PO SOLN: 1000.0000 mg | Freq: Four times a day (QID) | ORAL | Status: DC

## 2016-03-23 MED ORDER — NALOXONE HCL 0.4 MG/ML IJ SOLN: 0.4000 mg | INTRAMUSCULAR | Status: DC | PRN

## 2016-03-23 MED ORDER — BUPIVACAINE HCL (PF) 0.5 % IJ SOLN
INTRAMUSCULAR | Status: AC
  Filled 2016-03-23: qty 10

## 2016-03-23 MED ORDER — HYDROMORPHONE HCL 1 MG/ML IJ SOLN: 0.2500 mg | INTRAMUSCULAR | Status: DC | PRN

## 2016-03-23 MED ORDER — ROCURONIUM BROMIDE 100 MG/10ML IV SOLN
INTRAVENOUS | Status: DC | PRN
  Administered 2016-03-23: 50 mg via INTRAVENOUS

## 2016-03-23 MED ORDER — GLYCOPYRROLATE 0.2 MG/ML IJ SOLN
INTRAMUSCULAR | Status: DC | PRN
  Administered 2016-03-23: 0.4 mg via INTRAVENOUS
  Administered 2016-03-23: 0.2 mg via INTRAVENOUS

## 2016-03-23 MED ORDER — PROPOFOL 10 MG/ML IV BOLUS
INTRAVENOUS | Status: DC | PRN
  Administered 2016-03-23: 140 mg via INTRAVENOUS

## 2016-03-23 MED ORDER — ONDANSETRON HCL 4 MG/2ML IJ SOLN
INTRAMUSCULAR | Status: AC
  Filled 2016-03-23: qty 2

## 2016-03-23 MED ORDER — METOPROLOL SUCCINATE ER 25 MG PO TB24
25.0000 mg | ORAL_TABLET | Freq: Every day | ORAL | Status: DC
  Administered 2016-03-24 – 2016-03-26 (×3): 25 mg via ORAL
  Filled 2016-03-23 (×3): qty 1

## 2016-03-23 MED ORDER — LEVALBUTEROL HCL 0.63 MG/3ML IN NEBU
0.6300 mg | INHALATION_SOLUTION | Freq: Four times a day (QID) | RESPIRATORY_TRACT | Status: DC
  Administered 2016-03-23 (×2): 0.63 mg via RESPIRATORY_TRACT
  Filled 2016-03-23 (×2): qty 3

## 2016-03-23 MED ORDER — DIPHENHYDRAMINE HCL 50 MG/ML IJ SOLN: 12.5000 mg | Freq: Four times a day (QID) | INTRAMUSCULAR | Status: DC | PRN

## 2016-03-23 MED ORDER — POTASSIUM CHLORIDE 10 MEQ/50ML IV SOLN: 10.0000 meq | Freq: Every day | INTRAVENOUS | Status: DC | PRN

## 2016-03-23 MED ORDER — METOCLOPRAMIDE HCL 5 MG/ML IJ SOLN
10.0000 mg | Freq: Four times a day (QID) | INTRAMUSCULAR | Status: AC
Start: 2016-03-23 — End: 2016-03-24
  Administered 2016-03-23 – 2016-03-24 (×3): 10 mg via INTRAVENOUS
  Filled 2016-03-23 (×3): qty 2

## 2016-03-23 MED ORDER — TIOTROPIUM BROMIDE MONOHYDRATE 18 MCG IN CAPS
18.0000 ug | ORAL_CAPSULE | Freq: Every day | RESPIRATORY_TRACT | Status: DC
  Administered 2016-03-26: 18 ug via RESPIRATORY_TRACT
  Filled 2016-03-23: qty 5

## 2016-03-23 MED ORDER — LEVALBUTEROL HCL 0.63 MG/3ML IN NEBU
0.6300 mg | INHALATION_SOLUTION | Freq: Three times a day (TID) | RESPIRATORY_TRACT | Status: DC
  Administered 2016-03-24 (×2): 0.63 mg via RESPIRATORY_TRACT
  Filled 2016-03-23 (×4): qty 3

## 2016-03-23 MED ORDER — BUPIVACAINE 0.5 % ON-Q PUMP SINGLE CATH 400 ML
400.0000 mL | INJECTION | Status: DC
  Filled 2016-03-23: qty 400

## 2016-03-23 MED ORDER — LIDOCAINE HCL (CARDIAC) 20 MG/ML IV SOLN
INTRAVENOUS | Status: DC | PRN
  Administered 2016-03-23: 60 mg via INTRAVENOUS
  Administered 2016-03-23: 40 mg via INTRAVENOUS

## 2016-03-23 MED ORDER — DEXTROSE 5 % IV SOLN
1.5000 g | Freq: Two times a day (BID) | INTRAVENOUS | Status: AC
  Administered 2016-03-23 – 2016-03-24 (×2): 1.5 g via INTRAVENOUS
  Filled 2016-03-23 (×2): qty 1.5

## 2016-03-23 MED ORDER — FENTANYL 40 MCG/ML IV SOLN
INTRAVENOUS | Status: DC
  Administered 2016-03-23: 50 ug via INTRAVENOUS
  Administered 2016-03-23: 110 ug via INTRAVENOUS
  Administered 2016-03-23: 12:00:00 via INTRAVENOUS
  Administered 2016-03-24: 10 ug via INTRAVENOUS
  Administered 2016-03-24 (×2): 20 ug via INTRAVENOUS
  Administered 2016-03-24: 60 ug via INTRAVENOUS
  Administered 2016-03-24: 50 ug via INTRAVENOUS
  Administered 2016-03-24: 70 ug via INTRAVENOUS
  Administered 2016-03-25: 20 ug via INTRAVENOUS
  Administered 2016-03-25: 30 ug via INTRAVENOUS
  Administered 2016-03-25: 10 ug via INTRAVENOUS

## 2016-03-23 MED ORDER — BISACODYL 5 MG PO TBEC
10.0000 mg | DELAYED_RELEASE_TABLET | Freq: Every day | ORAL | Status: DC
  Administered 2016-03-24 – 2016-03-26 (×3): 10 mg via ORAL
  Filled 2016-03-23 (×3): qty 2

## 2016-03-23 MED ORDER — NEOSTIGMINE METHYLSULFATE 10 MG/10ML IV SOLN
INTRAVENOUS | Status: DC | PRN
Start: 2016-03-23 — End: 2016-03-23
  Administered 2016-03-23: 3 mg via INTRAVENOUS

## 2016-03-23 MED ORDER — BUPIVACAINE HCL (PF) 0.5 % IJ SOLN
INTRAMUSCULAR | Status: DC | PRN
  Administered 2016-03-23: 10 mL

## 2016-03-23 MED ORDER — PROPOFOL 10 MG/ML IV BOLUS
INTRAVENOUS | Status: AC
  Filled 2016-03-23: qty 20

## 2016-03-23 MED ORDER — TRAMADOL HCL 50 MG PO TABS
50.0000 mg | ORAL_TABLET | Freq: Four times a day (QID) | ORAL | Status: DC | PRN
  Administered 2016-03-23 – 2016-03-25 (×4): 100 mg via ORAL
  Filled 2016-03-23 (×4): qty 2

## 2016-03-23 MED ORDER — GLYCOPYRROLATE 0.2 MG/ML IV SOSY
PREFILLED_SYRINGE | INTRAVENOUS | Status: AC
  Filled 2016-03-23: qty 3

## 2016-03-23 MED ORDER — POTASSIUM CHLORIDE IN NACL 20-0.9 MEQ/L-% IV SOLN
INTRAVENOUS | Status: DC
  Administered 2016-03-23 – 2016-03-24 (×3): via INTRAVENOUS
  Filled 2016-03-23 (×3): qty 1000

## 2016-03-23 MED ORDER — DIPHENHYDRAMINE HCL 12.5 MG/5ML PO ELIX: 12.5000 mg | ORAL_SOLUTION | Freq: Four times a day (QID) | ORAL | Status: DC | PRN

## 2016-03-23 MED ORDER — METFORMIN HCL 500 MG PO TABS
500.0000 mg | ORAL_TABLET | Freq: Two times a day (BID) | ORAL | Status: DC
  Administered 2016-03-24 – 2016-03-26 (×5): 500 mg via ORAL
  Filled 2016-03-23 (×5): qty 1

## 2016-03-23 MED ORDER — MIDAZOLAM HCL 2 MG/2ML IJ SOLN
INTRAMUSCULAR | Status: AC
  Filled 2016-03-23: qty 2

## 2016-03-23 MED ORDER — FENTANYL 40 MCG/ML IV SOLN
INTRAVENOUS | Status: AC
  Filled 2016-03-23: qty 25

## 2016-03-23 MED ORDER — BUPIVACAINE HCL (PF) 0.5 % IJ SOLN
INTRAMUSCULAR | Status: DC | PRN
  Administered 2016-03-23: 400 mL

## 2016-03-23 MED ORDER — SODIUM CHLORIDE 0.9% FLUSH: 9.0000 mL | INTRAVENOUS | Status: DC | PRN

## 2016-03-23 MED ORDER — ARTIFICIAL TEARS OP OINT
TOPICAL_OINTMENT | OPHTHALMIC | Status: DC | PRN
  Administered 2016-03-23: 1 via OPHTHALMIC

## 2016-03-23 MED ORDER — ONDANSETRON HCL 4 MG/2ML IJ SOLN
4.0000 mg | Freq: Four times a day (QID) | INTRAMUSCULAR | Status: DC | PRN
  Administered 2016-03-24 (×2): 4 mg via INTRAVENOUS
  Filled 2016-03-23 (×2): qty 2

## 2016-03-23 MED ORDER — SODIUM CHLORIDE 0.9 % IJ SOLN
INTRAMUSCULAR | Status: AC
  Filled 2016-03-23: qty 10

## 2016-03-23 MED ORDER — SENNOSIDES-DOCUSATE SODIUM 8.6-50 MG PO TABS
1.0000 | ORAL_TABLET | Freq: Every day | ORAL | Status: DC
  Administered 2016-03-25: 1 via ORAL
  Filled 2016-03-23 (×2): qty 1

## 2016-03-23 MED ORDER — LACTATED RINGERS IV SOLN: INTRAVENOUS | Status: DC

## 2016-03-23 MED ORDER — MIDAZOLAM HCL 5 MG/5ML IJ SOLN
INTRAMUSCULAR | Status: DC | PRN
  Administered 2016-03-23: 2 mg via INTRAVENOUS

## 2016-03-23 MED ORDER — ACETAMINOPHEN 500 MG PO TABS
1000.0000 mg | ORAL_TABLET | Freq: Four times a day (QID) | ORAL | Status: DC
  Administered 2016-03-23 – 2016-03-26 (×13): 1000 mg via ORAL
  Filled 2016-03-23 (×13): qty 2

## 2016-03-23 MED ORDER — LACTATED RINGERS IV SOLN
INTRAVENOUS | Status: DC | PRN
  Administered 2016-03-23 (×2): via INTRAVENOUS

## 2016-03-23 MED ORDER — NEOSTIGMINE METHYLSULFATE 5 MG/5ML IV SOSY
PREFILLED_SYRINGE | INTRAVENOUS | Status: AC
  Filled 2016-03-23: qty 5

## 2016-03-23 MED ORDER — HEMOSTATIC AGENTS (NO CHARGE) OPTIME
TOPICAL | Status: DC | PRN
  Administered 2016-03-23: 1 via TOPICAL

## 2016-03-23 MED ORDER — ATORVASTATIN CALCIUM 40 MG PO TABS
40.0000 mg | ORAL_TABLET | Freq: Every day | ORAL | Status: DC
  Administered 2016-03-23 – 2016-03-25 (×3): 40 mg via ORAL
  Filled 2016-03-23 (×3): qty 1

## 2016-03-23 MED ORDER — CETYLPYRIDINIUM CHLORIDE 0.05 % MT LIQD
7.0000 mL | Freq: Two times a day (BID) | OROMUCOSAL | Status: DC
  Administered 2016-03-23 – 2016-03-26 (×4): 7 mL via OROMUCOSAL

## 2016-03-23 MED ORDER — SERTRALINE HCL 50 MG PO TABS
50.0000 mg | ORAL_TABLET | Freq: Every day | ORAL | Status: DC
Start: 2016-03-24 — End: 2016-03-26
  Administered 2016-03-24 – 2016-03-26 (×3): 50 mg via ORAL
  Filled 2016-03-23 (×3): qty 1

## 2016-03-23 MED ORDER — PHENYLEPHRINE HCL 10 MG/ML IJ SOLN
10.0000 mg | INTRAVENOUS | Status: DC | PRN
  Administered 2016-03-23: 10 ug/min via INTRAVENOUS

## 2016-03-23 MED ORDER — LOSARTAN POTASSIUM 25 MG PO TABS
25.0000 mg | ORAL_TABLET | Freq: Every day | ORAL | Status: DC
  Administered 2016-03-24 – 2016-03-26 (×3): 25 mg via ORAL
  Filled 2016-03-23 (×4): qty 1

## 2016-03-23 MED ORDER — FLUTICASONE PROPIONATE 50 MCG/ACT NA SUSP
2.0000 | Freq: Every day | NASAL | Status: DC | PRN
  Filled 2016-03-23: qty 16

## 2016-03-23 MED ORDER — MEPERIDINE HCL 25 MG/ML IJ SOLN: 6.2500 mg | INTRAMUSCULAR | Status: DC | PRN

## 2016-03-23 MED ORDER — CEFUROXIME SODIUM 1.5 G IJ SOLR
INTRAMUSCULAR | Status: AC
  Filled 2016-03-23: qty 1.5

## 2016-03-23 MED ORDER — OXYCODONE HCL 5 MG PO TABS
5.0000 mg | ORAL_TABLET | ORAL | Status: DC | PRN
  Administered 2016-03-23: 10 mg via ORAL
  Filled 2016-03-23 (×2): qty 2

## 2016-03-23 MED ORDER — INSULIN ASPART 100 UNIT/ML ~~LOC~~ SOLN
0.0000 [IU] | Freq: Three times a day (TID) | SUBCUTANEOUS | Status: DC
  Administered 2016-03-23: 8 [IU] via SUBCUTANEOUS
  Administered 2016-03-24: 4 [IU] via SUBCUTANEOUS
  Administered 2016-03-24 – 2016-03-25 (×2): 2 [IU] via SUBCUTANEOUS

## 2016-03-23 MED ORDER — LUNG SURGERY BOOK
Freq: Once | Status: AC
  Administered 2016-03-23: 18:00:00
  Filled 2016-03-23: qty 1

## 2016-03-23 MED ORDER — BUPROPION HCL ER (XL) 300 MG PO TB24
300.0000 mg | ORAL_TABLET | Freq: Every day | ORAL | Status: DC
Start: 2016-03-24 — End: 2016-03-26
  Administered 2016-03-24 – 2016-03-26 (×3): 300 mg via ORAL
  Filled 2016-03-23 (×3): qty 1

## 2016-03-23 SURGICAL SUPPLY — 84 items
ADH SKN CLS APL DERMABOND .7 (GAUZE/BANDAGES/DRESSINGS) ×2
APPLIER CLIP ROT 10 11.4 M/L (STAPLE)
APR CLP MED LRG 11.4X10 (STAPLE)
BAG SPEC RTRVL LRG 6X4 10 (ENDOMECHANICALS)
BLADE SURG 11 STRL SS (BLADE) ×2 IMPLANT
CANISTER SUCTION 2500CC (MISCELLANEOUS) ×4 IMPLANT
CATH KIT ON Q 5IN SLV (PAIN MANAGEMENT) IMPLANT
CATH THORACIC 28FR (CATHETERS) ×2 IMPLANT
CATH THORACIC 28FR RT ANG (CATHETERS) IMPLANT
CATH THORACIC 36FR (CATHETERS) IMPLANT
CATH THORACIC 36FR RT ANG (CATHETERS) IMPLANT
CLIP APPLIE ROT 10 11.4 M/L (STAPLE) IMPLANT
CLIP TI MEDIUM 6 (CLIP) IMPLANT
CONN Y 3/8X3/8X3/8  BEN (MISCELLANEOUS)
CONN Y 3/8X3/8X3/8 BEN (MISCELLANEOUS) ×2 IMPLANT
CONT SPEC 4OZ CLIKSEAL STRL BL (MISCELLANEOUS) ×16 IMPLANT
COVER SURGICAL LIGHT HANDLE (MISCELLANEOUS) ×4 IMPLANT
CUTTER ECHEON FLEX ENDO 45 340 (ENDOMECHANICALS) ×2 IMPLANT
DERMABOND ADVANCED (GAUZE/BANDAGES/DRESSINGS) ×2
DERMABOND ADVANCED .7 DNX12 (GAUZE/BANDAGES/DRESSINGS) IMPLANT
DRAIN CHANNEL 28F RND 3/8 FF (WOUND CARE) IMPLANT
DRAIN CHANNEL 32F RND 10.7 FF (WOUND CARE) IMPLANT
DRAPE LAPAROSCOPIC ABDOMINAL (DRAPES) ×4 IMPLANT
DRAPE WARM FLUID 44X44 (DRAPE) ×2 IMPLANT
ELECT BLADE 6.5 EXT (BLADE) ×2 IMPLANT
ELECT REM PT RETURN 9FT ADLT (ELECTROSURGICAL) ×4
ELECTRODE REM PT RTRN 9FT ADLT (ELECTROSURGICAL) ×2 IMPLANT
GAUZE SPONGE 4X4 12PLY STRL (GAUZE/BANDAGES/DRESSINGS) ×4 IMPLANT
GLOVE BIO SURGEON STRL SZ 6.5 (GLOVE) ×3 IMPLANT
GLOVE BIO SURGEONS STRL SZ 6.5 (GLOVE) ×3
GLOVE BIOGEL PI IND STRL 6 (GLOVE) IMPLANT
GLOVE BIOGEL PI IND STRL 6.5 (GLOVE) IMPLANT
GLOVE BIOGEL PI INDICATOR 6 (GLOVE) ×6
GLOVE BIOGEL PI INDICATOR 6.5 (GLOVE) ×2
GLOVE SURG SIGNA 7.5 PF LTX (GLOVE) ×8 IMPLANT
GOWN STRL REUS W/ TWL LRG LVL3 (GOWN DISPOSABLE) ×4 IMPLANT
GOWN STRL REUS W/ TWL XL LVL3 (GOWN DISPOSABLE) ×2 IMPLANT
GOWN STRL REUS W/TWL LRG LVL3 (GOWN DISPOSABLE) ×8
GOWN STRL REUS W/TWL XL LVL3 (GOWN DISPOSABLE) ×4
HEMOSTAT SURGICEL 2X14 (HEMOSTASIS) IMPLANT
KIT BASIN OR (CUSTOM PROCEDURE TRAY) ×4 IMPLANT
KIT ROOM TURNOVER OR (KITS) ×4 IMPLANT
KIT SUCTION CATH 14FR (SUCTIONS) ×2 IMPLANT
NS IRRIG 1000ML POUR BTL (IV SOLUTION) ×8 IMPLANT
PACK CHEST (CUSTOM PROCEDURE TRAY) ×4 IMPLANT
PAD ARMBOARD 7.5X6 YLW CONV (MISCELLANEOUS) ×8 IMPLANT
POUCH ENDO CATCH II 15MM (MISCELLANEOUS) IMPLANT
POUCH SPECIMEN RETRIEVAL 10MM (ENDOMECHANICALS) IMPLANT
RELOAD GOLD ECHELON 45 (STAPLE) ×22 IMPLANT
SEALANT PROGEL (MISCELLANEOUS) IMPLANT
SEALANT SURG COSEAL 4ML (VASCULAR PRODUCTS) IMPLANT
SEALANT SURG COSEAL 8ML (VASCULAR PRODUCTS) IMPLANT
SOLUTION ANTI FOG 6CC (MISCELLANEOUS) ×4 IMPLANT
SPECIMEN JAR MEDIUM (MISCELLANEOUS) ×2 IMPLANT
SPONGE GAUZE 4X4 12PLY STER LF (GAUZE/BANDAGES/DRESSINGS) ×2 IMPLANT
SPONGE INTESTINAL PEANUT (DISPOSABLE) IMPLANT
SPONGE TONSIL 1 RF SGL (DISPOSABLE) ×4 IMPLANT
SUT PROLENE 4 0 RB 1 (SUTURE)
SUT PROLENE 4-0 RB1 .5 CRCL 36 (SUTURE) IMPLANT
SUT SILK  1 MH (SUTURE) ×4
SUT SILK 1 MH (SUTURE) ×4 IMPLANT
SUT SILK 2 0SH CR/8 30 (SUTURE) ×2 IMPLANT
SUT SILK 3 0SH CR/8 30 (SUTURE) IMPLANT
SUT VIC AB 1 CTX 36 (SUTURE) ×4
SUT VIC AB 1 CTX36XBRD ANBCTR (SUTURE) IMPLANT
SUT VIC AB 2-0 CTX 36 (SUTURE) ×2 IMPLANT
SUT VIC AB 2-0 UR6 27 (SUTURE) IMPLANT
SUT VIC AB 3-0 MH 27 (SUTURE) IMPLANT
SUT VIC AB 3-0 X1 27 (SUTURE) ×4 IMPLANT
SUT VICRYL 2 TP 1 (SUTURE) IMPLANT
SWAB COLLECTION DEVICE MRSA (MISCELLANEOUS) IMPLANT
SYSTEM SAHARA CHEST DRAIN ATS (WOUND CARE) ×4 IMPLANT
TAPE CLOTH 4X10 WHT NS (GAUZE/BANDAGES/DRESSINGS) ×4 IMPLANT
TAPE CLOTH SURG 4X10 WHT LF (GAUZE/BANDAGES/DRESSINGS) ×2 IMPLANT
TIP APPLICATOR SPRAY EXTEND 16 (VASCULAR PRODUCTS) IMPLANT
TOWEL OR 17X24 6PK STRL BLUE (TOWEL DISPOSABLE) ×4 IMPLANT
TOWEL OR 17X26 10 PK STRL BLUE (TOWEL DISPOSABLE) ×8 IMPLANT
TRAP SPECIMEN MUCOUS 40CC (MISCELLANEOUS) IMPLANT
TRAY FOLEY CATH 16FRSI W/METER (SET/KITS/TRAYS/PACK) ×4 IMPLANT
TROCAR XCEL BLADELESS 5X75MML (TROCAR) ×4 IMPLANT
TROCAR XCEL NON-BLD 5MMX100MML (ENDOMECHANICALS) IMPLANT
TUBE ANAEROBIC SPECIMEN COL (MISCELLANEOUS) IMPLANT
TUNNELER SHEATH ON-Q 11GX8 DSP (PAIN MANAGEMENT) IMPLANT
WATER STERILE IRR 1000ML POUR (IV SOLUTION) ×8 IMPLANT

## 2016-03-23 NOTE — Interval H&P Note (Signed)
History and Physical Interval Note:  03/23/2016 8:03 AM  Angela [name]  has presented today for surgery, with the diagnosis of ILD LLL NODULE  The various methods of treatment have been discussed with the patient and family. After consideration of risks, benefits and other options for treatment, the patient has consented to  Procedure(s): VIDEO ASSISTED THORACOSCOPY (Left) LUNG BIOPSY (Left) LYMPH NODE BIOPSY (Left) as a surgical intervention .  The patient's history has been reviewed, patient examined, no change in status, stable for surgery.  I have reviewed the patient's chart and labs.  Questions were answered to the patient's satisfaction.     Melrose Nakayama

## 2016-03-23 NOTE — Progress Notes (Signed)
PCA pt history cleared (151mcg)

## 2016-03-23 NOTE — H&P (View-Only) (Signed)
PCP is WEBB, Valla Leaver, MD Referring Provider is Brand Males, MD  Chief Complaint  Patient presents with  . Shortness of Breath    eval for lung biopsy.Marland KitchenMarland KitchenCT CHEST    HPI: Angela [name] is sent for consultation for lung biopsy for interstitial lung disease.   Mrs. Uptegrove is a 70 year old woman with a past history significant for a remote tobacco abuse (quit 3 years ago), Type 2 diabetes, COPD, interstitial lung disease, obstructive sleep apnea, anemia, fibromyalgia, degenerative disc disease, hypertension, and esophageal dysmotility. She also reports being exposed to "black mold" and Aspergillus in an apartment where she used to live.  She has been followed by Dr. Chase Caller for her COPD, alpha-1 MZ phenotype and interstitial lung disease. She had a high-resolution CT in January which was indicative of possible UIP. He recommended a lung biopsy, but she wanted to defer that until after she had an abdominal ventral hernia repair. Dr. Zella Richer felt that surgery was not a good idea due to the severity of her lung disease.   She saw Dr. Chase Caller again in April. She complained of a 6 week exacerbation with an increase in coughing, wheezing, chest tightness and shortness of breath. She was more open to the idea of a lung biopsy.  She does have obstructive sleep apnea and uses sleep apnea night. She does have a history of childhood asthma. She has a chronic dry cough. It was recently more productive but that is improving. She did have wheezing back in April, but is not having any currently.  Past Medical History  Diagnosis Date  . Diabetes mellitus   . Hypertension   . Depression     with anxiety  . DDD (degenerative disc disease)   . Fibromyalgia   . COPD (chronic obstructive pulmonary disease) ([place])   . Sleep apnea     wears CPAP  . Diverticulitis   . Diverticulosis   . Esophageal dysmotility 03/01/06    mild  . Anemia 2014    mild  . Complication of anesthesia     passes out 24 hrs  after getting  . Chronic kidney disease     early stages of some insufficiency  . Shingles     Past Surgical History  Procedure Laterality Date  . Appendectomy    . Tonsillectomy    . Gastric fundoplication    . Back surgery    . Hernia repair    . Knee arthroscopy      left   . De quervain's release      right  . Anterior fusion cervical spine    . Abdominal hysterectomy    . Posterior cervical fusion/foraminotomy N/A 03/16/2015    Procedure: POSTERIOR CERVICAL LAMINECTOMY AND DISKECTOMOY AT CERVICAL SEVEN-THORACIC ONE ;POSTERIOR CERVICAL FUSION CERVICAL SIX TO THORACIC ONE;  Surgeon: Kary Kos, MD;  Location: [facility];  Service: Neurosurgery;  Laterality: N/A;  Site is Cervical/Thoracic    Family History  Problem Relation Age of Onset  . COPD Father   . Colon cancer Maternal Aunt   . Stomach cancer Maternal Grandfather     Social History Social History  Substance Use Topics  . Smoking status: Former Smoker -- 1.00 packs/day for 22 years    Types: Cigarettes    Quit date: 04/14/2012  . Smokeless tobacco: Never Used     Comment: Quit 1 year ago in June 2013.    Marland Kitchen Alcohol Use: No    Current Outpatient Prescriptions  Medication Sig Dispense Refill  .  ACCU-CHEK AVIVA PLUS test strip TEST BLOOD GLUCOSE ONCE D  1  . albuterol (PROVENTIL) (2.5 MG/3ML) 0.083% nebulizer solution INAHLE CONTENTS OF ONE VIAL VIA NEBULIZER EVERY 6 HOURS AS NEEDED FOR WHEEZING OR SHORTNESS OF BREATH 900 mL 1  . atorvastatin (LIPITOR) 40 MG tablet Take 40 mg by mouth daily.   2  . B Complex-C (SUPER B-C PO) Take 1 tablet by mouth daily at 12 noon.     Marland Kitchen BILBERRY, VACCINIUM MYRTILLUS, PO Take by mouth daily.    Marland Kitchen buPROPion (WELLBUTRIN XL) 150 MG 24 hr tablet Take 300 mg by mouth daily.     . cyclobenzaprine (FLEXERIL) 10 MG tablet Take 10 mg by mouth at bedtime as needed for muscle spasms.   0  . fluticasone (FLONASE) 50 MCG/ACT nasal spray Place 2 sprays into both nostrils daily as needed for  allergies.   4  . losartan (COZAAR) 25 MG tablet TAKE 1 TABLET(25 MG) BY MOUTH DAILY 90 tablet 1  . metFORMIN (GLUCOPHAGE) 500 MG tablet Take 500 mg by mouth 2 (two) times daily with a meal.    . metoprolol succinate (TOPROL-XL) 25 MG 24 hr tablet Take 1 tablet (25 mg total) by mouth daily. Please call and schedule an appointment 30 tablet 0  . ondansetron (ZOFRAN ODT) 4 MG disintegrating tablet Take 1 tablet (4 mg total) by mouth every 8 (eight) hours as needed for nausea or vomiting. 10 tablet 0  . promethazine (PHENERGAN) 25 MG tablet Take 25 mg by mouth every 6 (six) hours as needed for nausea or vomiting.    . sertraline (ZOLOFT) 50 MG tablet Take 50 mg by mouth daily.    Marland Kitchen tiotropium (SPIRIVA) 18 MCG inhalation capsule Place 1 capsule (18 mcg total) into inhaler and inhale daily. 30 capsule 12  . traMADol (ULTRAM) 50 MG tablet Take 50 mg by mouth daily as needed for moderate pain or severe pain.   0  . vitamin B-12 (CYANOCOBALAMIN) 500 MCG tablet Take 500 mcg by mouth daily.     No current facility-administered medications for this visit.    Allergies  Allergen Reactions  . Codeine Nausea And Vomiting  . Iohexol      Desc: Pt states several years ago during a CT scan w/ iv cotnrast she had severe nausea and vomiting, sob w/ difficulty breathing and swallowing.  She had full 13hr. premeds today (5/14/7) and did fine w/o complications.   . Tizanidine Other (See Comments)    Severe decrease in BP   . Ciprofloxacin Rash    Review of Systems  Constitutional: Positive for unexpected weight change (Has lost 12 pounds in 3 months). Negative for activity change, appetite change and fatigue.  HENT: Negative for trouble swallowing and voice change.   Eyes:       Bilateral cataract surgery  Respiratory: Positive for apnea (uses CPAP at night), cough (Nonproductive) and shortness of breath. Negative for chest tightness and wheezing.   Cardiovascular: Negative for chest pain and leg swelling.   Gastrointestinal: Negative for abdominal pain and blood in stool.  Endocrine: Negative for polydipsia and polyphagia.  Genitourinary: Positive for frequency. Negative for difficulty urinating.  Musculoskeletal: Positive for joint swelling, arthralgias, neck pain and neck stiffness.  Neurological: Negative for syncope and weakness.       History of TIAs characterized by facial droop in 2010. No residual  Hematological: Positive for adenopathy (On CT scan). Bruises/bleeds easily.    BP 126/66 mmHg  Pulse 64  Resp 16  Ht 5' 7" (1.702 m)  Wt 199 lb (90.266 kg)  BMI 31.16 kg/m2  SpO2 98% Physical Exam  Constitutional: She is oriented to person, place, and time. She appears well-nourished. No distress.  Obese  HENT:  Head: Normocephalic and atraumatic.  Eyes: Conjunctivae and EOM are normal. No scleral icterus.  Neck: No tracheal deviation present. No thyromegaly present.  ROM slightly limited in extension. Well-healed scar posteriorly  Cardiovascular: Normal rate, regular rhythm, normal heart sounds and intact distal pulses.  Exam reveals no gallop and no friction rub.   No murmur heard. Pulmonary/Chest: Effort normal. No respiratory distress. She has no wheezes. She has rales (Faint bibasilar).  Abdominal: Soft. She exhibits no distension. There is no tenderness. There is no rebound.  Musculoskeletal: Normal range of motion. She exhibits no edema.  Lymphadenopathy:    She has no cervical adenopathy.  Neurological: She is alert and oriented to person, place, and time. No cranial nerve deficit. She exhibits normal muscle tone.  Skin: Skin is warm and dry. No rash noted.  Vitals reviewed.    Diagnostic Tests: ADDENDUM REPORT: 12/16/2015 11:56 ADDENDUM: At the request of Dr. Ramaswamy, pulmonary parenchymal fibrosis pattern is reviewed per ATS criteria for UIP. Findings are indicative of possible UIP. Electronically Signed  By: Melinda Blietz M.D.  On: 12/16/2015 11:56      Study Result     CLINICAL DATA: Follow-up interstitial lung disease. Chronic shortness of breath. Former smoker.  EXAM: CT CHEST WITHOUT CONTRAST  TECHNIQUE: Multidetector CT imaging of the chest was performed following the standard protocol without intravenous contrast. High resolution imaging of the lungs, as well as inspiratory and expiratory imaging, was performed.  COMPARISON: 08/18/2015 high-resolution chest CT.  FINDINGS: Mediastinum/Nodes: Normal heart size. No pericardial fluid/thickening. Left anterior descending and right coronary atherosclerosis. Great vessels are normal in course and caliber. Normal visualized thyroid. Normal esophagus. Surgical clips are again noted near the esophagogastric junction, probably from prior Nissen fundoplication. No axillary adenopathy. Increased mild right paratracheal lymphadenopathy, largest 1.6 cm (series 4/ image 22), previously 1.4 cm. New mildly enlarged 1.1 cm subcarinal node (series 4/ image 29). New mildly enlarged 1.1 cm aortopulmonary window node (series 4/image 22) . Suggestion of new mild bilateral hilar adenopathy, poorly delineated on this noncontrast study.  Lungs/Pleura: No pneumothorax. No pleural effusion. Mild-to-moderate upper lung predominant centrilobular, paraseptal and bullous emphysema. Subpleural 6 mm solid pulmonary nodule in the anterior left lower lobe abutting the left major fissure (series 5/image 28), increased from 3 mm on 08/18/2015. No acute consolidative airspace disease or additional significant pulmonary nodules. There are patchy basilar predominant regions of subpleural reticulation and traction bronchiectasis in both lungs, mildly increased since 08/18/2015. There are small patchy regions of honeycombing in the posterior lower lobes bilaterally, which appear slightly increased since 08/18/2015. No significant air trapping on the expiration sequence.  Upper abdomen:  Unremarkable.  Musculoskeletal: No aggressive appearing focal osseous lesions. Mild-to-moderate degenerative changes in the thoracic spine.  IMPRESSION: 1. Mild interval progression of patchy basilar predominant regions of subpleural reticulation, traction bronchiectasis and mild honeycombing, most suggestive of usual interstitial pneumonia (UIP). 2. Interval growth of subpleural 6 mm solid left lower lobe pulmonary nodule abutting the left major fissure. 3. A follow-up high-resolution chest CT is advised in 3-6 months to reassess the pulmonary nodule and interstitial lung disease. 4. Mild mediastinal and bilateral hilar lymphadenopathy, increased, nonspecific. 5. Two-vessel coronary atherosclerosis. 6. Mild to moderate emphysema.  Electronically Signed: By: Jason A   Poff M.D. On: 12/15/2015 15:15   I personally reviewed the CT chest and concur with the findings as noted above.  Impression: 70 year old woman with multiple medical issues including hypertension, fibromyalgia, dyslipidemia, COPD with mixed phenotype alpha 1 antitrypsin deficiency, interstitial lung disease, cervical disc disease requiring surgery, a ventral hernia, obesity, and remote tobacco abuse. She has interstitial lung disease of unknown etiology and needs a lung biopsy for diagnostic purposes.   In addition to emphysema and interstitial lung disease, her CT also shows a 6 mm subpleural nodule along the major fissure in the left lower lobe and mediastinal adenopathy.  I discussed the proposed operation with her. We will plan to do a left VATS, lung biopsy, and lymph node biopsy. The AP window node should be easily accessible. She asked whether we plan to take the 6 mm subpleural nodule. Given the time interval since her last scan which is now almost 3 months I think the scan needs to be repeated prior to surgery and if there is any evidence of growth we would definitely want to remove the subpleural nodule.  Otherwise, it would depend on how difficult that area is to access the time of surgery. She understands the operation would be done under general anesthesia, the incisions to be used, the use of a drainage tube postoperatively, the expected hospital stay, and the overall recovery. She understands this is diagnostic and not therapeutic. I reviewed the indications, risks, benefits, and alternatives. She understands the risks include, but are not limited to death, MI, DVT, PE, bleeding, possible need for transfusion, infection, prolonged air leak, cardiac arrhythmias, respiratory failure, as well as the possibility of other unforeseeable complications.  She accepts the risks and wishes to proceed.  I did the discuss the possibility of a clinical trial which involves bronchoscopy for biopsy and bronchoalveolar lavage. She was not interested in learning more about that.  She wants to talk to her son about the timing of surgery before deciding.  Plan: Repeat CT chest  Left VATS, lung biopsy, lymph node biopsy once patient decides on a date.  Melrose Nakayama, MD [facility] [phone]

## 2016-03-23 NOTE — Anesthesia Preprocedure Evaluation (Addendum)
Anesthesia Evaluation  Patient identified by MRN, date of birth, ID band Patient awake    Reviewed: Allergy & Precautions, NPO status , Patient's Chart, lab work & pertinent test results, reviewed documented beta blocker date and time   History of Anesthesia Complications (+) history of anesthetic complications  Airway Mallampati: II  TM Distance: >3 FB Neck ROM: Full    Dental  (+) Edentulous Upper, Dental Advisory Given   Pulmonary sleep apnea , COPD, former smoker,     + decreased breath sounds      Cardiovascular hypertension, Pt. on medications and Pt. on home beta blockers  Rhythm:Regular Rate:Normal     Neuro/Psych PSYCHIATRIC DISORDERS Depression  Neuromuscular disease CVA    GI/Hepatic negative GI ROS, Neg liver ROS,   Endo/Other  diabetes, Type 2, Oral Hypoglycemic Agents  Renal/GU Renal disease  negative genitourinary   Musculoskeletal  (+) Arthritis , Fibromyalgia -  Abdominal   Peds negative pediatric ROS (+)  Hematology negative hematology ROS (+)   Anesthesia Other Findings   Reproductive/Obstetrics                            Lab Results  Component Value Date   WBC 8.4 03/21/2016   HGB 10.5* 03/21/2016   HCT 32.4* 03/21/2016   MCV 88.8 03/21/2016   PLT 231 03/21/2016   Lab Results  Component Value Date   CREATININE 1.12* 03/21/2016   BUN 16 03/21/2016   NA 140 03/21/2016   K 4.4 03/21/2016   CL 108 03/21/2016   CO2 21* 03/21/2016   Lab Results  Component Value Date   INR 1.02 03/21/2016   INR 0.98 05/29/2013   03/2016 EKG: normal sinus rhythm.    Anesthesia Physical Anesthesia Plan  ASA: III  Anesthesia Plan: General   Post-op Pain Management:    Induction: Intravenous  Airway Management Planned: Double Lumen EBT  Additional Equipment: CVP and Arterial line  Intra-op Plan:   Post-operative Plan: Extubation in OR  Informed Consent: I have  reviewed the patients History and Physical, chart, labs and discussed the procedure including the risks, benefits and alternatives for the proposed anesthesia with the patient or authorized representative who has indicated his/her understanding and acceptance.   Dental advisory given  Plan Discussed with: CRNA  Anesthesia Plan Comments:         Anesthesia Quick Evaluation

## 2016-03-23 NOTE — Progress Notes (Signed)
pt is hurting some, but very sleepy; p.o. meds given; PCA set up as ordered; unable to educate at this time; button not given to patient at this time Will continue to monitor closely & re-address PCA prn

## 2016-03-23 NOTE — Anesthesia Procedure Notes (Addendum)
Central Venous Catheter Insertion Performed by: anesthesiologist 03/23/2016 8:25 AM Preanesthetic checklist: patient identified, IV checked, site marked, risks and benefits discussed, surgical consent, monitors and equipment checked, pre-op evaluation, timeout performed and anesthesia consent Position: Trendelenburg Lidocaine 1% used for infiltration Landmarks identified and Seldinger technique used Catheter size: 8 Fr Central line was placed.Double lumen Procedure performed using ultrasound guided technique. Attempts: 1 Following insertion, line sutured, dressing applied and Biopatch. Post procedure assessment: free fluid flow, blood return through all ports and no air. Patient tolerated the procedure well with no immediate complications.   Procedure Name: Intubation Date/Time: 03/23/2016 8:54 AM Performed by: Mariea Clonts Pre-anesthesia Checklist: Emergency Drugs available, Patient identified, Timeout performed, Suction available and Patient being monitored Patient Re-evaluated:Patient Re-evaluated prior to inductionOxygen Delivery Method: Circle system utilized Preoxygenation: Pre-oxygenation with 100% oxygen Intubation Type: IV induction Ventilation: Oral airway inserted - appropriate to patient size Laryngoscope Size: Mac and 3 Grade View: Grade II Tube type: Oral Endobronchial tube: 37 Fr Number of attempts: 1 Placement Confirmation: ETT inserted through vocal cords under direct vision,  breath sounds checked- equal and bilateral and positive ETCO2 Tube secured with: Tape Dental Injury: Teeth and Oropharynx as per pre-operative assessment

## 2016-03-23 NOTE — Brief Op Note (Addendum)
03/23/2016  10:53 AM  PATIENT:  Vernie Shanks  70 y.o. female  PRE-OPERATIVE DIAGNOSIS:   1. Interstitial Lung Disease 2. LLL NODULE  POST-OPERATIVE DIAGNOSIS:  1. Interstitial Lung Disease 2. Intrapulmonary lymph node LLL  PROCEDURE:  Procedure(s):  LEFT VIDEO ASSISTED THORACOSCOPY LUNG BIOPSY- LUL, LLL x 3 AP WINDOW LYMPH NODE BIOPSY (Left) INSERTION OF ON-Q LOCAL ANESTHETIC CATHETER  SURGEON:  Surgeon(s) and Role:    * Melrose Nakayama, MD - Primary  PHYSICIAN ASSISTANT: Ellwood Handler PA-C  ANESTHESIA:   general  EBL:  Total I/O In: 1400 [I.V.:1400] Out: -   BLOOD ADMINISTERED:none  DRAINS: 28 Straight Chest Tube   LOCAL MEDICATIONS USED:  MARCAINE     SPECIMEN:  Source of Specimen:  LUL, LLL  DISPOSITION OF SPECIMEN:  PATHOLOGY  COUNTS:  YES  PLAN OF CARE: Admit to inpatient   PATIENT DISPOSITION:  PACU - hemodynamically stable.   Delay start of Pharmacological VTE agent (>24hrs) due to surgical blood loss or risk of bleeding: yes

## 2016-03-23 NOTE — Progress Notes (Signed)
Pt A&OX4. Sitting up in bed. VSS. Instructed on how to use PCA.

## 2016-03-23 NOTE — Care Management Note (Signed)
Case Management Note Marvetta Gibbons RN, BSN Unit 2W-Case Manager [phone]  Patient Details  Name: Angela [name] MRN: [id number] Date of Birth: 03-13-1946  Subjective/Objective:   Pt admitted s/p VATS                 Action/Plan: PTA pt lived at home- anticipate return home when medically stable, CM to follow for d/c needs  Expected Discharge Date:                  Expected Discharge Plan:  Home/Self Care  In-House Referral:     Discharge planning Services  CM Consult  Post Acute Care Choice:    Choice offered to:     DME Arranged:    DME Agency:     HH Arranged:    HH Agency:     Status of Service:  In process, will continue to follow  Medicare Important Message Given:    Date Medicare IM Given:    Medicare IM give by:    Date Additional Medicare IM Given:    Additional Medicare Important Message give by:     If discussed at [place] of Stay Meetings, dates discussed:    Additional Comments:  Dawayne Patricia, RN 03/23/2016, 3:20 PM

## 2016-03-23 NOTE — Transfer of Care (Signed)
Immediate Anesthesia Transfer of Care Note  Patient: Angela [name]  Procedure(s) Performed: Procedure(s): VIDEO ASSISTED THORACOSCOPY (Left) LUNG BIOPSY (Left) LYMPH NODE BIOPSY (Left)  Patient Location: PACU  Anesthesia Type:General  Level of Consciousness: awake, alert  and oriented  Airway & Oxygen Therapy: Patient Spontanous Breathing and Patient connected to nasal cannula oxygen  Post-op Assessment: Report given to RN and Post -op Vital signs reviewed and stable  Post vital signs: Reviewed and stable  Last Vitals:  Filed Vitals:   03/23/16 1110 03/23/16 1115  BP:  135/64  Pulse:  64  Temp: 36.4 C   Resp:  22    Last Pain:  Filed Vitals:   03/23/16 1129  PainSc: Asleep         Complications: No apparent anesthesia complications

## 2016-03-23 NOTE — Anesthesia Postprocedure Evaluation (Signed)
Anesthesia Post Note  Patient: Angela [name]  Procedure(s) Performed: Procedure(s) (LRB): VIDEO ASSISTED THORACOSCOPY (Left) LUNG BIOPSY (Left) LYMPH NODE BIOPSY (Left)  Patient location during evaluation: PACU Anesthesia Type: General Level of consciousness: awake and alert Pain management: pain level controlled Vital Signs Assessment: post-procedure vital signs reviewed and stable Respiratory status: spontaneous breathing, nonlabored ventilation, respiratory function stable and patient connected to nasal cannula oxygen Cardiovascular status: blood pressure returned to baseline and stable Postop Assessment: no signs of nausea or vomiting Anesthetic complications: no    Last Vitals:  Filed Vitals:   03/23/16 1345 03/23/16 1415  BP: 121/59 129/52  Pulse: 64 65  Temp: 36.7 C 36.6 C  Resp: 17 16    Last Pain:  Filed Vitals:   03/23/16 1432  PainSc: [place] Fredie Majano

## 2016-03-23 NOTE — Progress Notes (Signed)
Utilization review completed.  

## 2016-03-24 ENCOUNTER — Encounter (HOSPITAL_COMMUNITY): Payer: Self-pay | Admitting: Thoracic Surgery (Cardiothoracic Vascular Surgery)

## 2016-03-24 ENCOUNTER — Inpatient Hospital Stay (HOSPITAL_COMMUNITY): Payer: Medicare Other

## 2016-03-24 LAB — CBC
HCT: 29.9 % — ABNORMAL LOW (ref 36.0–46.0)
HEMOGLOBIN: 9.5 g/dL — AB (ref 12.0–15.0)
MCH: 27.7 pg (ref 26.0–34.0)
MCHC: 31.8 g/dL (ref 30.0–36.0)
MCV: 87.2 fL (ref 78.0–100.0)
Platelets: 197 10*3/uL (ref 150–400)
RBC: 3.43 MIL/uL — AB (ref 3.87–5.11)
RDW: 14.4 % (ref 11.5–15.5)
WBC: 9.3 10*3/uL (ref 4.0–10.5)

## 2016-03-24 LAB — GLUCOSE, CAPILLARY
GLUCOSE-CAPILLARY: 116 mg/dL — AB (ref 65–99)
Glucose-Capillary: 127 mg/dL — ABNORMAL HIGH (ref 65–99)
Glucose-Capillary: 163 mg/dL — ABNORMAL HIGH (ref 65–99)
Glucose-Capillary: 89 mg/dL (ref 65–99)

## 2016-03-24 LAB — BASIC METABOLIC PANEL
Anion gap: 8 (ref 5–15)
BUN: 12 mg/dL (ref 6–20)
CHLORIDE: 108 mmol/L (ref 101–111)
CO2: 24 mmol/L (ref 22–32)
CREATININE: 1.03 mg/dL — AB (ref 0.44–1.00)
Calcium: 8.8 mg/dL — ABNORMAL LOW (ref 8.9–10.3)
GFR calc Af Amer: >60 mL/min (ref >60)
GFR, EST NON AFRICAN AMERICAN: 55 mL/min — AB (ref >60)
Glucose, Bld: 98 mg/dL (ref 65–99)
Potassium: 4.7 mmol/L (ref 3.5–5.1)
SODIUM: 140 mmol/L (ref 135–145)

## 2016-03-24 LAB — BLOOD GAS, ARTERIAL
ACID-BASE DEFICIT: 1.4 mmol/L (ref 0.0–2.0)
BICARBONATE: 23.1 meq/L (ref 20.0–24.0)
Delivery systems: POSITIVE
Drawn by: 437071
FIO2: 0.32
Mode: POSITIVE
O2 Content: 3 L/min
O2 SAT: 93.7 %
PATIENT TEMPERATURE: 98
PCO2 ART: 39.8 mmHg (ref 35.0–45.0)
PO2 ART: 69.6 mmHg — AB (ref 80.0–100.0)
TCO2: 24.3 mmol/L (ref 0–100)
pH, Arterial: 7.379 (ref 7.350–7.450)

## 2016-03-24 MED ORDER — PROMETHAZINE HCL 25 MG PO TABS
25.0000 mg | ORAL_TABLET | Freq: Four times a day (QID) | ORAL | Status: DC | PRN
  Administered 2016-03-24 – 2016-03-25 (×2): 25 mg via ORAL
  Filled 2016-03-24 (×2): qty 1

## 2016-03-24 MED ORDER — CYCLOBENZAPRINE HCL 10 MG PO TABS: 10.0000 mg | ORAL_TABLET | Freq: Every evening | ORAL | Status: DC | PRN

## 2016-03-24 MED ORDER — ENOXAPARIN SODIUM 40 MG/0.4ML ~~LOC~~ SOLN
40.0000 mg | SUBCUTANEOUS | Status: DC
  Administered 2016-03-24 – 2016-03-26 (×3): 40 mg via SUBCUTANEOUS
  Filled 2016-03-24 (×3): qty 0.4

## 2016-03-24 NOTE — Progress Notes (Addendum)
[address]       [place] [address]             [phone]      1 Day Post-Op Procedure(s) (LRB): VIDEO ASSISTED THORACOSCOPY (Left) LUNG BIOPSY (Left) LYMPH NODE BIOPSY (Left) Subjective: C/o some nausea, back pain(chronic)  Objective: Vital signs in last 24 hours: Temp:  [97.5 F (36.4 C)-98.1 F (36.7 C)] 97.9 F (36.6 C) (05/11 0759) Pulse Rate:  [56-77] 57 (05/11 0759) Cardiac Rhythm:  [-] Sinus bradycardia (05/11 0759) Resp:  [9-22] 19 (05/11 0759) BP: (106-144)/(44-90) 129/44 mmHg (05/11 0759) SpO2:  [95 %-100 %] 98 % (05/11 0759) Arterial Line BP: (131-179)/(48-73) 131/52 mmHg (05/11 0759) Weight:  [207 lb 3.7 oz (94 kg)] 207 lb 3.7 oz (94 kg) (05/10 1415)  Hemodynamic parameters for last 24 hours:    Intake/Output from previous day: 05/10 0701 - 05/11 0700 In: 4320 [P.O.:1440; I.V.:2810; IV Piggyback:50] Out: 4322 [Urine:3800; Blood:32; Chest Tube:490] Intake/Output this shift:    General appearance: alert, cooperative and no distress Heart: regular rate and rhythm Lungs: slightly coarse Abdomen: benign Extremities: no edema Wound: dressings intact  Lab Results:  Recent Labs  03/21/16 1036 03/24/16 0346  WBC 8.4 9.3  HGB 10.5* 9.5*  HCT 32.4* 29.9*  PLT 231 197   BMET:  Recent Labs  03/21/16 1036 03/24/16 0346  NA 140 140  K 4.4 4.7  CL 108 108  CO2 21* 24  GLUCOSE 106* 98  BUN 16 12  CREATININE 1.12* 1.03*  CALCIUM 9.5 8.8*    PT/INR:  Recent Labs  03/21/16 1036  LABPROT 13.6  INR 1.02   ABG    Component Value Date/Time   PHART 7.379 03/24/2016 0340   HCO3 23.1 03/24/2016 0340   TCO2 24.3 03/24/2016 0340   ACIDBASEDEF 1.4 03/24/2016 0340   O2SAT 93.7 03/24/2016 0340   CBG (last 3)   Recent Labs  03/23/16 1116 03/23/16 1721 03/23/16 2148  GLUCAP 141* 125* 232*    Meds Scheduled Meds: . acetaminophen  1,000 mg Oral Q6H   Or  . acetaminophen (TYLENOL) oral liquid 160 mg/5 mL  1,000 mg  Oral Q6H  . antiseptic oral rinse  7 mL Mouth Rinse BID  . atorvastatin  40 mg Oral q1800  . bisacodyl  10 mg Oral Daily  . buPROPion  300 mg Oral Daily  . cefUROXime (ZINACEF)  IV  1.5 g Intravenous Q12H  . cyanocobalamin  500 mcg Oral Daily  . fentaNYL   Intravenous Q4H  . insulin aspart  0-24 Units Subcutaneous TID AC & HS  . levalbuterol  0.63 mg Nebulization TID  . losartan  25 mg Oral Daily  . metFORMIN  500 mg Oral BID WC  . metoCLOPramide (REGLAN) injection  10 mg Intravenous Q6H  . metoprolol succinate  25 mg Oral Daily  . senna-docusate  1 tablet Oral QHS  . sertraline  50 mg Oral Daily  . tiotropium  18 mcg Inhalation Daily   Continuous Infusions: . 0.9 % NaCl with KCl 20 mEq / L 100 mL/hr at 03/23/16 2330   PRN Meds:.diphenhydrAMINE **OR** diphenhydrAMINE, fluticasone, naloxone **AND** sodium chloride flush, ondansetron (ZOFRAN) IV, oxyCODONE, potassium chloride, traMADol  Xrays Ct Chest Wo Contrast  03/22/2016  CLINICAL DATA:  Preop assessment of lung nodule before open biopsy of interstitial lung disease. EXAM: CT CHEST WITHOUT CONTRAST TECHNIQUE: Multidetector CT imaging of the chest was performed following the standard protocol without IV  contrast. COMPARISON:  12/15/2015 FINDINGS: THORACIC INLET/BODY WALL: No acute abnormality. MEDIASTINUM: Normal heart size. No pericardial effusion. No acute vascular abnormality. Paratracheal, subcarinal, AP window lymph nodes have decreased in size and are normal diameter today. Decreased size of enlarged right hilar lymph nodes, 14 mm short axis on the right today. LUNG WINDOWS: Emphysema and interstitial lung disease with honeycombing as recently characterized by high-resolution CT. There is a 5 x 4 mm (5 mm mean diameter) pulmonary nodule re- identified along the lower left major fissure (4:56) that is stable from prior. Triangular morphology and location on sagittal reformats suggests lymph node. UPPER ABDOMEN: Changes of Nissen  fundoplication OSSEOUS: No acute fracture.  No suspicious lytic or blastic lesions. IMPRESSION: 1. The subpleural left lower lobe pulmonary nodule remains stable at 5 to 6 mm. Morphology suggests subpleural lymph node. 2. Mediastinal and hilar lymph nodes have decreased in size since prior, many now normal diameter, suggesting improved inflammatory adenopathy. 3. Interstitial lung disease without progression since prior. Electronically Signed   By: Monte Fantasia M.D.   On: 03/22/2016 09:38   Dg Chest Port 1 View  03/23/2016  CLINICAL DATA:  Status post left VATS EXAM: PORTABLE CHEST 1 VIEW COMPARISON:  03/21/2016 FINDINGS: Cardiac shadow remains enlarged. A right jugular central line is noted in the superior vena cava. No pneumothorax is noted. The left thoracostomy catheter is noted over the left apex. No pneumothorax is seen. Scattered fibrotic changes are noted similar to that seen on previous CT and chest x-ray. Bibasilar atelectatic changes are noted related to the poor inspiratory effort. IMPRESSION: Postoperative change with tubes and lines as described. Bibasilar atelectatic changes. Electronically Signed   By: Inez Catalina M.D.   On: 03/23/2016 12:51    Assessment/Plan: S/P Procedure(s) (LRB): VIDEO ASSISTED THORACOSCOPY (Left) LUNG BIOPSY (Left) LYMPH NODE BIOPSY (Left)  1 will check CXR this am, no air leak, 490 cc drainage recorded post op 2 H/H 9.5/29 which is fairly stable from chronic anemia 3 d/c aline and foley, decrease IVF 4 cont SSI, resuming glucophage 5 cont resp rx 6 mobilize as able 7 cont pca for now  LOS: 1 day    GOLD,WAYNE E 03/24/2016  Patient seen and examined, agree with above CT to water seal IS, nebs, pulmonary hygiene SCD + enoxaparin for DVT prophylaxis  Remo Lipps C. Roxan Hockey, MD [facility] [phone]

## 2016-03-24 NOTE — Op Note (Signed)
NAMEMarland Kitchen  Angela [name], Angela [name]                  ACCOUNT NO.:  [id number]  MEDICAL RECORD NO.:  [id number]  LOCATION:  [id number]                        FACILITY:  [facility]  PHYSICIAN:  Revonda Standard. Roxan Hockey, M.D.DATE OF BIRTH:  1946/01/05  DATE OF PROCEDURE:  03/23/2016 DATE OF DISCHARGE:                              OPERATIVE REPORT   PREOPERATIVE DIAGNOSIS:  Interstitial lung disease and left lower lobe lung nodule.  POSTOPERATIVE DIAGNOSIS:  Interstitial lung disease and intraparenchymal lymph node in left lower lobe.  PROCEDURE:   Left video-assisted thoracoscopy, Lung biopsy, Biopsy of aortopulmonary window lymph node, and  On-Q local anesthetic catheter placement.  SURGEON:  Revonda Standard. Roxan Hockey, M.D.  ASSISTANT:  Ellwood Handler, PA-C.  ANESTHESIA:  General.  FINDINGS:  Intraparenchymal lymph node in the left lower lobe corresponded to 5 mm nodule on CT scan.  Mildly enlarged aortopulmonary window nodes. Initial frozen section of lower lobe biopsy showed very mild fibrosis changes.  Second frozen section showed more definitive interstitial lung disease.  CLINICAL NOTE:  Angela [name] is a 70 year old woman with progressive dyspnea and interstitial lung disease of unknown etiology, who was referred for lung biopsy for diagnostic purposes.  The indications, risks, benefits, and alternatives were discussed in detail with the patient.  She understood and accepted the risks and agreed to proceed.  OPERATIVE NOTE:  Angela [name] was brought to the preoperative holding area on Mar 23, 2016.  Anesthesia placed a central line and an arterial blood pressure monitoring line.  She was taken to the operating room, anesthetized, and intubated with a double-lumen endotracheal tube. Sequential compression devices were placed on the calves for DVT prophylaxis.  Intravenous antibiotics were administered.  A Foley catheter was placed.  She was placed in a right lateral decubitus position and the left chest was  prepped and draped in usual sterile fashion.  Single lung ventilation of the right lung was initiated and was tolerated well throughout the procedure.  The chest was prepped and draped in usual sterile fashion.  After performing a time-out, an incision was made in the seventh intercostal space.  A 5-mm port was inserted.  A thoracoscope was advanced into the chest.  There was good isolation of the left lung.  A 5-cm long working incision was made in the fourth interspace anterolaterally.  No rib spreading was performed during the procedure.  There was no pleural effusion.  The lung overall had a slight grey tinged coloration.  There was not a great amount of nodularity to the lung.  The fissure was complete.  Along the fissure in the superior segment of the left lower lobe, there was an intraparenchymal lymph node which corresponded to the 5-mm nodule in the left lower lobe.  This was removed with sequential firings of an endoscopic GIA stapler. An Ethicon Powered stapler with gold cartridges was used.  The lungs surrounding this node appeared relatively normal.  Inspection of the  anterobasilar portion of the lower lobe revealed an area that appeared more diseased.  This was biopsied using the same stapler.  This specimen was sent for frozen section.  While awaiting that result, a biopsy was obtained from the posterior aspect of  the upper lobe near the major fissure.  This specimen was sent for permanent pathology.  The pleura overlying the aortopulmonary window was incised.  A mildly enlarged aortopulmonary lymph node was removed, and this was sent for permanent pathology.  The frozen section returned showing very mild fibrosis.  A decision was made to take an additional biopsy from the posterior aspect of the left lower lobe along the basilar aspect.  This was again done using the Ethicon Powered stapler with gold cartridges.  The specimen was sent for frozen section.  While awaiting  that result, an On-Q local anesthetic catheter was tunneled through a separate stab incision posteriorly and tunneled into a subpleural location.  It was secured at the skin with 3- 0 silk suture and primed with 5 mL of 0.5% Marcaine.  The frozen section returned showing interstitial lung disease.  Final diagnosis will await permanent pathology.  A 28-French chest tube was placed through the original port incision.  It was secured to the skin with a #1 silk suture.  The lung was reinflated with good aeration of both lobes.  The working incision was closed with #1 Vicryl fascial suture, the subcutaneous tissue and skin were closed in standard fashion.  The chest tube was placed to suction.  The patient was placed back in supine position.  She was extubated in the operating room and taken to the postanesthetic care unit in good condition.     Revonda Standard Roxan Hockey, M.D.     SCH/MEDQ  D:  03/23/2016  T:  03/24/2016  Job:  [id number]

## 2016-03-25 ENCOUNTER — Inpatient Hospital Stay (HOSPITAL_COMMUNITY): Payer: Medicare Other

## 2016-03-25 LAB — CBC
HEMATOCRIT: 31.8 % — AB (ref 36.0–46.0)
HEMOGLOBIN: 9.8 g/dL — AB (ref 12.0–15.0)
MCH: 27.4 pg (ref 26.0–34.0)
MCHC: 30.8 g/dL (ref 30.0–36.0)
MCV: 88.8 fL (ref 78.0–100.0)
Platelets: 205 10*3/uL (ref 150–400)
RBC: 3.58 MIL/uL — ABNORMAL LOW (ref 3.87–5.11)
RDW: 14.6 % (ref 11.5–15.5)
WBC: 10.3 10*3/uL (ref 4.0–10.5)

## 2016-03-25 LAB — GLUCOSE, CAPILLARY
GLUCOSE-CAPILLARY: 133 mg/dL — AB (ref 65–99)
GLUCOSE-CAPILLARY: 95 mg/dL (ref 65–99)
GLUCOSE-CAPILLARY: 122 mg/dL — AB (ref 65–99)
Glucose-Capillary: 101 mg/dL — ABNORMAL HIGH (ref 65–99)

## 2016-03-25 LAB — COMPREHENSIVE METABOLIC PANEL
ALBUMIN: 3 g/dL — AB (ref 3.5–5.0)
ALK PHOS: 89 U/L (ref 38–126)
ALT: 18 U/L (ref 14–54)
ANION GAP: 9 (ref 5–15)
AST: 16 U/L (ref 15–41)
BILIRUBIN TOTAL: 0.3 mg/dL (ref 0.3–1.2)
BUN: 13 mg/dL (ref 6–20)
CO2: 24 mmol/L (ref 22–32)
Calcium: 8.8 mg/dL — ABNORMAL LOW (ref 8.9–10.3)
Chloride: 108 mmol/L (ref 101–111)
Creatinine, Ser: 1.17 mg/dL — ABNORMAL HIGH (ref 0.44–1.00)
GFR calc Af Amer: 55 mL/min — ABNORMAL LOW (ref >60)
GFR, EST NON AFRICAN AMERICAN: 47 mL/min — AB (ref >60)
GLUCOSE: 114 mg/dL — AB (ref 65–99)
POTASSIUM: 4.8 mmol/L (ref 3.5–5.1)
Sodium: 141 mmol/L (ref 135–145)
TOTAL PROTEIN: 5.8 g/dL — AB (ref 6.5–8.1)

## 2016-03-25 MED ORDER — HYDROCHLOROTHIAZIDE 25 MG PO TABS
25.0000 mg | ORAL_TABLET | Freq: Every day | ORAL | Status: DC
  Administered 2016-03-25 – 2016-03-26 (×2): 25 mg via ORAL
  Filled 2016-03-25 (×2): qty 1

## 2016-03-25 MED ORDER — LEVALBUTEROL HCL 0.63 MG/3ML IN NEBU: 0.6300 mg | INHALATION_SOLUTION | Freq: Four times a day (QID) | RESPIRATORY_TRACT | Status: DC | PRN

## 2016-03-25 MED ORDER — POTASSIUM CHLORIDE CRYS ER 10 MEQ PO TBCR
10.0000 meq | EXTENDED_RELEASE_TABLET | Freq: Every day | ORAL | Status: DC
  Administered 2016-03-25 – 2016-03-26 (×2): 10 meq via ORAL
  Filled 2016-03-25 (×3): qty 1

## 2016-03-25 MED ORDER — HYDRALAZINE HCL 25 MG PO TABS
25.0000 mg | ORAL_TABLET | Freq: Once | ORAL | Status: AC
  Administered 2016-03-25: 25 mg via ORAL
  Filled 2016-03-25: qty 1

## 2016-03-25 NOTE — Progress Notes (Addendum)
      [address]       [place] [address]             [phone]      2 Days Post-Op Procedure(s) (LRB): VIDEO ASSISTED THORACOSCOPY (Left) LUNG BIOPSY (Left) LYMPH NODE BIOPSY (Left)   Subjective:  [name] [name] complains of feeling a little swollen.  She states she sometimes has to take HCTZ at home which she has not been getting here.  Good use of IS  Objective: Vital signs in last 24 hours: Temp:  [98 F (36.7 C)-98.8 F (37.1 C)] 98.8 F (37.1 C) (05/12 0743) Pulse Rate:  [60-73] 60 (05/12 0800) Cardiac Rhythm:  [-] Normal sinus rhythm (05/12 0345) Resp:  [11-23] 15 (05/12 0800) BP: (145-171)/(44-72) 154/72 mmHg (05/12 0800) SpO2:  [89 %-96 %] 94 % (05/12 0800)  Intake/Output from previous day: 05/11 0701 - 05/12 0700 In: 2342.5 [P.O.:1080; I.V.:1262.5] Out: 2575 [Urine:2325; Chest Tube:250] Intake/Output this shift: Total I/O In: -  Out: 60 [Chest Tube:60]  General appearance: alert, cooperative and no distress Heart: regular rate and rhythm Lungs: clear to auscultation bilaterally Abdomen: soft, non-tender; bowel sounds normal; no masses,  no organomegaly Wound: clean and dry  Lab Results:  Recent Labs  03/24/16 0346 03/25/16 0320  WBC 9.3 10.3  HGB 9.5* 9.8*  HCT 29.9* 31.8*  PLT 197 205   BMET:  Recent Labs  03/24/16 0346 03/25/16 0320  NA 140 141  K 4.7 4.8  CL 108 108  CO2 24 24  GLUCOSE 98 114*  BUN 12 13  CREATININE 1.03* 1.17*  CALCIUM 8.8* 8.8*    PT/INR: No results for input(s): LABPROT, INR in the last 72 hours. ABG    Component Value Date/Time   PHART 7.379 03/24/2016 0340   HCO3 23.1 03/24/2016 0340   TCO2 24.3 03/24/2016 0340   ACIDBASEDEF 1.4 03/24/2016 0340   O2SAT 93.7 03/24/2016 0340   CBG (last 3)   Recent Labs  03/24/16 1435 03/24/16 2108 03/25/16 0746  GLUCAP 163* 89 133*    Assessment/Plan: S/P Procedure(s) (LRB): VIDEO ASSISTED THORACOSCOPY (Left) LUNG BIOPSY (Left) LYMPH NODE  BIOPSY (Left)  1. Chest tube- no air leak on water seal, 250 cc output yesterday- CXR with small pneumothorax- possibly remove chest tube today 2. Pulm- wean oxygen as tolerated, continue IS 3. CV- H/O HTN- on home Cozaar, Toprol XL- will add HCTZ which patient often requires at home 4. Decrease IV fluid to [facility]- as patient with good PO intake 5. Chronic Anemia- Hgb remains stable 6. Dispo- patient stable, no air leak 250 cc output yesterday.Marland Kitchen CXR with small pneumothorax- can possibly remove chest tube today, continue current care   LOS: 2 days    [name] [name] 03/25/2016  Patient seen and examined, agree with above Dc CT Possibly home in AM  [place] C. Roxan Hockey, MD [facility] [phone]

## 2016-03-25 NOTE — Discharge Summary (Signed)
Physician Discharge Summary  Patient ID: OYINKANSOLA DRONET MRN: [id number] DOB/AGE: 1946-04-28 70 y.o.  Admit date: 03/23/2016 Discharge date: 03/26/2016  Admission Diagnoses:Interstitial lung disease  Discharge Diagnoses:  Active Problems:   ILD (interstitial lung disease) [facility])  Patient Active Problem List   Diagnosis Date Noted  . COPD exacerbation ([place]) 09/09/2015  . Alpha-1-antitrypsin deficiency ([place]) 09/09/2015  . Pulmonary emphysema ([place]) 09/09/2015  . ILD (interstitial lung disease) ([place]) 09/09/2015  . Smoking history 08/11/2015  . Family history of alpha 1 antitrypsin deficiency 08/11/2015  . Dyspnea and respiratory abnormality 08/11/2015  . HNP (herniated nucleus pulposus) 03/16/2015  . Obesity 02/05/2014  . Internal hemorrhoid 08/23/2013  . RUQ abdominal pain 08/23/2013  . Nonspecific abnormal finding in stool contents 08/23/2013  . Abdominal pain, epigastric 06/03/2013  . Acute diverticulitis 05/29/2013  . Abdominal pain, left lower quadrant 05/29/2013  . Nausea and vomiting 05/29/2013  . Leukocytosis 05/29/2013  . Acute renal failure ([place]) 05/29/2013  . Anemia 05/29/2013  . Diabetes ([place]) 05/29/2013  . Dyslipidemia 05/29/2013  . HTN (hypertension) 04/14/2013   HPI: Mrs. Eckerle is sent for consultation for lung biopsy for interstitial lung disease.   Mrs. Kruszewski is a 70 year old woman with a past history significant for a remote tobacco abuse (quit 3 years ago), Type 2 diabetes, COPD, interstitial lung disease, obstructive sleep apnea, anemia, fibromyalgia, degenerative disc disease, hypertension, and esophageal dysmotility. She also reports being exposed to "black mold" and Aspergillus in an apartment where she used to live.  She has been followed by Dr. Chase Caller for her COPD, alpha-1 MZ phenotype and interstitial lung disease. She had a high-resolution CT in January which was indicative of possible UIP. He recommended a lung biopsy, but she wanted to defer that until  after she had an abdominal ventral hernia repair. Dr. Zella Richer felt that surgery was not a good idea due to the severity of her lung disease.   She saw Dr. Chase Caller again in April. She complained of a 6 week exacerbation with an increase in coughing, wheezing, chest tightness and shortness of breath. She was more open to the idea of a lung biopsy.  She does have obstructive sleep apnea and uses sleep apnea night. She does have a history of childhood asthma. She has a chronic dry cough. It was recently more productive but that is improving. She did have wheezing back in April, but is not having any currently  She was admitted to the hospital electively for video-assisted thoracoscopy for lung biopsy.   Discharged Condition: good  Hospital Course: The patient was admitted electively and taken to the operating room on 03/23/2016 and underwent left video-assisted thoracoscopy with lung biopsy and biopsy of aortopulmonary window lymph node. She tolerated the procedure well was taken to the post anesthesia care unit in stable condition  Postoperatively the patient has progressed nicely. She has been hemodynamically stable. The chest tube was removed on postoperative day #2. Oxygen is being weaned. She has a chronic anemia and hemoglobin remains stable. She is tolerating routine activities using standard protocols. Home medications for hypertension have been resumed. Incision is noted to be healing well without evidence of infection. She is tolerating diet. Her current status is felt to be tentatively stable for discharge in the next 24-48 hours pending ongoing reevaluation of her recovery.  Consults: None  Significant Diagnostic Studies: routine post-op labs/CXR  Treatments: Surgery: DATE OF PROCEDURE: 03/23/2016 DATE OF DISCHARGE:   OPERATIVE REPORT   PREOPERATIVE DIAGNOSIS: Interstitial lung disease and left  lower lobe lung nodule.  POSTOPERATIVE  DIAGNOSIS: Interstitial lung disease and intraparenchymal lymph nodes in left lower lobe.  PROCEDURE: Left video-assisted thoracoscopy, lung biopsy, biopsy of aortopulmonary window lymph node, and On-Q local anesthetic catheter placement.  SURGEON: Revonda Standard. Roxan Hockey, M.D.  ASSISTANTJunie Panning Barrett PA-C  ANESTHESIA: General.  FINDINGS: Intraparenchymal lymph node in the left lower lobe corresponded to 5 mm nodule on CT scan. Mildly enlarged aortopulmonary window nodes. Initial frozen section of lower lobe biopsy showed very mild fibrosis changes. Second frozen section showed more definitive interstitial lung disease.  Disposition: 01-Home or Self Care    Pathology: Pending`  Discharge medications:     Medication List    TAKE these medications        ACCU-CHEK AVIVA PLUS test strip  Generic drug:  glucose blood  TEST BLOOD GLUCOSE ONCE D     acetaminophen 500 MG tablet  Commonly known as:  TYLENOL  Take 2 tablets (1,000 mg total) by mouth every 6 (six) hours as needed for mild pain or fever.     albuterol (2.5 MG/3ML) 0.083% nebulizer solution  Commonly known as:  PROVENTIL  INAHLE CONTENTS OF ONE VIAL VIA NEBULIZER EVERY 6 HOURS AS NEEDED FOR WHEEZING OR SHORTNESS OF BREATH     atorvastatin 40 MG tablet  Commonly known as:  LIPITOR  Take 40 mg by mouth daily.     BILBERRY (VACCINIUM MYRTILLUS) PO  Take 1 capsule by mouth daily.     buPROPion 150 MG 24 hr tablet  Commonly known as:  WELLBUTRIN XL  Take 300 mg by mouth daily.     cyclobenzaprine 10 MG tablet  Commonly known as:  FLEXERIL  Take 10 mg by mouth at bedtime as needed for muscle spasms.     fluticasone 50 MCG/ACT nasal spray  Commonly known as:  FLONASE  Place 2 sprays into both nostrils daily as needed for allergies.     losartan 25 MG tablet  Commonly known as:  COZAAR  TAKE 1 TABLET(25 MG) BY MOUTH DAILY     metFORMIN 500 MG tablet  Commonly known as:  GLUCOPHAGE  Take 500  mg by mouth 2 (two) times daily with a meal.     metoprolol succinate 25 MG 24 hr tablet  Commonly known as:  TOPROL-XL  Take 1 tablet (25 mg total) by mouth daily. Please call and schedule an appointment     ondansetron 4 MG disintegrating tablet  Commonly known as:  ZOFRAN ODT  Take 1 tablet (4 mg total) by mouth every 8 (eight) hours as needed for nausea or vomiting.     promethazine 25 MG tablet  Commonly known as:  PHENERGAN  Take 25 mg by mouth every 6 (six) hours as needed for nausea or vomiting.     promethazine 25 MG tablet  Commonly known as:  PHENERGAN  Take 1 tablet (25 mg total) by mouth every 6 (six) hours as needed for nausea or vomiting.     sertraline 50 MG tablet  Commonly known as:  ZOLOFT  Take 50 mg by mouth daily.     SUPER B-C PO  Take 1 tablet by mouth daily at 12 noon.     tiotropium 18 MCG inhalation capsule  Commonly known as:  SPIRIVA  Place 1 capsule (18 mcg total) into inhaler and inhale daily.     traMADol 50 MG tablet  Commonly known as:  ULTRAM  Take 50 mg by mouth daily as needed for moderate  pain or severe pain.     traMADol 50 MG tablet  Commonly known as:  ULTRAM  Take 1-2 tablets (50-100 mg total) by mouth every 6 (six) hours as needed (mild pain).     vitamin B-12 500 MCG tablet  Commonly known as:  CYANOCOBALAMIN  Take 500 mcg by mouth daily.       Follow-up Information    Follow up with Melrose Nakayama, MD.   Specialty:  Cardiothoracic Surgery   Why:  Appointment to see Dr. Roxan Hockey on 04/12/2016 at 2:30 PM. Please obtain a chest x-ray and [facility] at 12 noon. [facility] is located in the same office complex.   Contact information:   [address] [place] [address] [phone]       Signed: Ellwood Handler 03/26/2016, 9:59 AM

## 2016-03-25 NOTE — Discharge Instructions (Signed)
Thoracoscopy, Care After °Refer to this sheet in the next few weeks. These instructions provide you with information about caring for yourself after your procedure. Your health care provider may also give you more specific instructions. Your treatment has been planned according to current medical practices, but problems sometimes occur. Call your health care provider if you have any problems or questions after your procedure. °WHAT TO EXPECT AFTER THE PROCEDURE: °After your procedure, it is common to feel sore for up to two weeks. °HOME CARE INSTRUCTIONS °· There are many different ways to close and cover an incision, including stitches (sutures), skin glue, and adhesive strips. Follow your health care provider's instructions about: °¨ Incision care. °¨ Bandage (dressing) changes and removal. °¨ Incision closure removal. °· Check your incision area every day for signs of infection. Watch for: °¨ Redness, swelling, or pain. °¨ Fluid, blood, or pus. °· Take medicines only as directed by your health care provider. °· Try to cough often. Coughing helps to protect against lung infection (pneumonia). It may hurt to cough. If this happens, hold a pillow against your chest when you cough. °· Take deep breaths. This also helps to protect against pneumonia. °· If you were given an incentive spirometer, use it as directed by your health care provider. °· Do not take baths, swim, or use a hot tub until your health care provider approves. You may take showers. °· Avoid lifting until your health care provider approves. °· Avoid driving until your health care provider approves. °· Do not travel by airplane after the chest tube is removed until your health care provider approves. °SEEK MEDICAL CARE IF: °· You have a fever. °· Pain medicines do not ease your pain. °· You have redness, swelling, or increasing pain in your incision area. °· You develop a cough that does not go away, or you are coughing up mucus that is yellow or  green. °SEEK IMMEDIATE MEDICAL CARE IF: °· You have fluid, blood, or pus coming from your incision. °· There is a bad smell coming from your incision or dressing. °· You develop a rash. °· You have difficulty breathing. °· You cough up blood. °· You develop light-headedness or you feel faint. °· You develop chest pain. °· Your heartbeat feels irregular or very fast. °  °This information is not intended to replace advice given to you by your health care provider. Make sure you discuss any questions you have with your health care provider. °  °Document Released: 05/20/2005 Document Revised: 11/21/2014 Document Reviewed: 07/16/2014 °Elsevier Interactive Patient Education ©2016 [employer] ° °

## 2016-03-25 NOTE — Progress Notes (Signed)
Pt b/p was 171/68. Notified Dr. Servando Snare, PRN order given of PO hydralazine. Medication given will cont to monitor.

## 2016-03-25 NOTE — Progress Notes (Signed)
Pt. placed on CPAP for h/s, added 2L oxygen to circuit, humidity filled, tolerating well, RT to monitor.

## 2016-03-25 NOTE — Progress Notes (Signed)
70 mcg of fentanyl cleared from PCA pump prior to d/c pump. Jacob Moores second nurse verify.

## 2016-03-25 NOTE — Care Management Note (Signed)
Case Management Note  Patient Details  Name: Angela [name] MRN: [id number] Date of Birth: March 13, 1946  Subjective/Objective:    Patient lives with son, s/p VATS, chest tube to water seal x 1 will d/c per RN, has pca, ambulatory, most likely dc over weekend.  NCM will cont to follow for dc needs.                Action/Plan:   Expected Discharge Date:                  Expected Discharge Plan:  Home/Self Care  In-House Referral:     Discharge planning Services  CM Consult  Post Acute Care Choice:    Choice offered to:     DME Arranged:    DME Agency:     HH Arranged:    HH Agency:     Status of Service:  In process, will continue to follow  Medicare Important Message Given:    Date Medicare IM Given:    Medicare IM give by:    Date Additional Medicare IM Given:    Additional Medicare Important Message give by:     If discussed at [place] of Stay Meetings, dates discussed:    Additional Comments:  Zenon Mayo, RN 03/25/2016, 2:19 PM

## 2016-03-25 NOTE — Progress Notes (Signed)
Patient ambulated approximately 310 ft around the unit and in her room this morning on room air using the front wheel walker.  Vital signs remained stable during ambulation, however, oxygen saturation displayed variously in the 60-80 range with a poor waveform.  Pt denied shortness of breath. Pt is now sitting in chair with [place] at 2L. Will continue to monitor.

## 2016-03-26 ENCOUNTER — Inpatient Hospital Stay (HOSPITAL_COMMUNITY): Payer: Medicare Other

## 2016-03-26 LAB — GLUCOSE, CAPILLARY
GLUCOSE-CAPILLARY: 76 mg/dL (ref 65–99)
Glucose-Capillary: 93 mg/dL (ref 65–99)

## 2016-03-26 MED ORDER — TRAMADOL HCL 50 MG PO TABS: 50.0000 mg | ORAL_TABLET | Freq: Four times a day (QID) | ORAL | Status: DC | PRN

## 2016-03-26 MED ORDER — ACETAMINOPHEN 500 MG PO TABS: 1000.0000 mg | ORAL_TABLET | Freq: Four times a day (QID) | ORAL | Status: DC | PRN

## 2016-03-26 MED ORDER — PROMETHAZINE HCL 25 MG PO TABS: 25.0000 mg | ORAL_TABLET | Freq: Four times a day (QID) | ORAL | Status: DC | PRN

## 2016-03-26 NOTE — Progress Notes (Signed)
Discharge instructions reviewed with patient and patient's family at the bedside. All medications reviewed and Rx's given. Patient stated the pharmacy already called her to tell her several medications are ready. Patient has two Rx's that required provider signature and pt stated she will get them filled on her way home. Educated patient on signs and symptoms of infection and how to care for surgical site. Patient reports that she is ready for discharge.

## 2016-03-26 NOTE — Progress Notes (Addendum)
      [address]       [place] [address]             [phone]      3 Days Post-Op Procedure(s) (LRB): VIDEO ASSISTED THORACOSCOPY (Left) LUNG BIOPSY (Left) LYMPH NODE BIOPSY (Left)   Subjective:  Ms. Angela [name] has no complaints.  She is ready to go home.  Objective: Vital signs in last 24 hours: Temp:  [97.7 F (36.5 C)-99.3 F (37.4 C)] 98.3 F (36.8 C) (05/13 0750) Pulse Rate:  [51-67] 57 (05/13 0750) Cardiac Rhythm:  [-] Normal sinus rhythm (05/13 0807) Resp:  [12-23] 16 (05/13 0750) BP: (137-178)/(51-74) 137/51 mmHg (05/13 0750) SpO2:  [96 %-100 %] 98 % (05/13 0922)  Intake/Output from previous day: 05/12 0701 - 05/13 0700 In: 720 [P.O.:720] Out: 1390 [Urine:1300; Chest Tube:90] Intake/Output this shift: Total I/O In: 300 [P.O.:300] Out: -   General appearance: alert, cooperative and no distress Heart: regular rate and rhythm Lungs: clear to auscultation bilaterally Abdomen: soft, non-tender; bowel sounds normal; no masses,  no organomegaly Wound: clean and dry  Lab Results:  Recent Labs  03/24/16 0346 03/25/16 0320  WBC 9.3 10.3  HGB 9.5* 9.8*  HCT 29.9* 31.8*  PLT 197 205   BMET:  Recent Labs  03/24/16 0346 03/25/16 0320  NA 140 141  K 4.7 4.8  CL 108 108  CO2 24 24  GLUCOSE 98 114*  BUN 12 13  CREATININE 1.03* 1.17*  CALCIUM 8.8* 8.8*    PT/INR: No results for input(s): LABPROT, INR in the last 72 hours. ABG    Component Value Date/Time   PHART 7.379 03/24/2016 0340   HCO3 23.1 03/24/2016 0340   TCO2 24.3 03/24/2016 0340   ACIDBASEDEF 1.4 03/24/2016 0340   O2SAT 93.7 03/24/2016 0340   CBG (last 3)   Recent Labs  03/25/16 1639 03/25/16 2119 03/26/16 0747  GLUCAP 101* 122* 93    Assessment/Plan: S/P Procedure(s) (LRB): VIDEO ASSISTED THORACOSCOPY (Left) LUNG BIOPSY (Left) LYMPH NODE BIOPSY (Left)  1. Pulm- CXR stable, post chest tube removal, no pneumothorax present... Continue IS at discharge, wean  oxygen as tolerated 2. CV- HTN- continue home regimen of medications 3. Dispo- patient stable, will wean oxygen if unable can arrange for home use, d/c home today   LOS: 3 days    Angela [name] 03/26/2016  Home today  I have seen and examined Angela [name] and agree with the above assessment  and plan.  Grace Isaac MD [facility] [phone] Office [phone] 03/26/2016 10:08 AM

## 2016-03-31 ENCOUNTER — Telehealth: Payer: Self-pay | Admitting: Internal Medicine

## 2016-03-31 NOTE — Telephone Encounter (Signed)
Angela [name] had VATS Bx -> pls give fu with me  2-3 weeks to discuss results

## 2016-03-31 NOTE — Telephone Encounter (Signed)
Pt has follow up ov on 04/18/16 with MR.   Called spoke pt. Verified her ov with MR. She voiced understanding and had no further questions. Nothing further needed.

## 2016-04-07 ENCOUNTER — Other Ambulatory Visit: Payer: Self-pay | Admitting: Cardiology

## 2016-04-08 ENCOUNTER — Other Ambulatory Visit: Payer: Self-pay | Admitting: Thoracic Surgery (Cardiothoracic Vascular Surgery)

## 2016-04-08 ENCOUNTER — Other Ambulatory Visit: Payer: Self-pay | Admitting: Cardiology

## 2016-04-08 DIAGNOSIS — J849 Interstitial pulmonary disease, unspecified: Secondary | ICD-10-CM

## 2016-04-12 ENCOUNTER — Encounter (HOSPITAL_COMMUNITY): Payer: Self-pay

## 2016-04-12 ENCOUNTER — Encounter: Payer: Self-pay | Admitting: Thoracic Surgery (Cardiothoracic Vascular Surgery)

## 2016-04-12 ENCOUNTER — Ambulatory Visit
Admission: RE | Admit: 2016-04-12 | Discharge: 2016-04-12 | Disposition: A | Discharge status: Home / Self Care | Payer: Medicare Other | Source: Ambulatory Visit | Attending: Thoracic Surgery (Cardiothoracic Vascular Surgery) | Admitting: Thoracic Surgery (Cardiothoracic Vascular Surgery)

## 2016-04-12 ENCOUNTER — Ambulatory Visit (INDEPENDENT_AMBULATORY_CARE_PROVIDER_SITE_OTHER): Payer: Self-pay | Admitting: Thoracic Surgery (Cardiothoracic Vascular Surgery)

## 2016-04-12 VITALS — BP 151/66 | HR 59 | Resp 16 | Ht 67.0 in | Wt 199.0 lb

## 2016-04-12 DIAGNOSIS — J849 Interstitial pulmonary disease, unspecified: Secondary | ICD-10-CM

## 2016-04-12 DIAGNOSIS — J84112 Idiopathic pulmonary fibrosis: Secondary | ICD-10-CM

## 2016-04-12 NOTE — Progress Notes (Signed)
[address]       [place] [address]             [phone]      HPI: Mrs. Dial returns for a scheduled postoperative follow-up visit.  She is a 70 year old woman with a past history significant for a remote tobacco abuse (quit 3 years ago), Type 2 diabetes, COPD, interstitial lung disease, obstructive sleep apnea, anemia, fibromyalgia, degenerative disc disease, hypertension, and esophageal dysmotility. She also reports being exposed to "black mold" and Aspergillus in an apartment where she used to live.  She has been followed by Dr. Chase Caller for her COPD, alpha-1 MZ phenotype and interstitial lung disease. She had a high-resolution CT in January which was indicative of possible UIP. He recommended a lung biopsy, but she wanted to defer that until after she had an abdominal ventral hernia repair. Dr. Zella Richer felt that surgery was not a good idea due to the severity of her lung disease.   She recently was having more difficulty from a respiratory standpoint and decided to go ahead with a lung biopsy.  I did a left thoracoscopic lung biopsy on 03/23/2016. Postoperatively she did well and was discharged on 03/26/2016. Since discharge she's been feeling well. She has not had any exacerbations of her respiratory symptoms. She notes some pain under the left breast. She is not taking any pain medication for that.  Past Medical History  Diagnosis Date  . Diabetes mellitus   . Hypertension   . Depression     with anxiety  . DDD (degenerative disc disease)   . Fibromyalgia   . COPD (chronic obstructive pulmonary disease) ([facility])   . Sleep apnea     wears CPAP  . Diverticulitis   . Diverticulosis   . Esophageal dysmotility 03/01/06    mild  . Anemia 2014    mild  . Complication of anesthesia     passes out 24 hrs after getting  . Chronic kidney disease     early stages of some insufficiency  . Shingles   . Stroke ([facility]) 2010    3 tia's  . Shortness of  breath dyspnea   . Alpha 1-antitrypsin PiMS phenotype     Current Outpatient Prescriptions  Medication Sig Dispense Refill  . ACCU-CHEK AVIVA PLUS test strip TEST BLOOD GLUCOSE ONCE D  1  . acetaminophen (TYLENOL) 500 MG tablet Take 2 tablets (1,000 mg total) by mouth every 6 (six) hours as needed for mild pain or fever. 30 tablet 0  . albuterol (PROVENTIL) (2.5 MG/3ML) 0.083% nebulizer solution INAHLE CONTENTS OF ONE VIAL VIA NEBULIZER EVERY 6 HOURS AS NEEDED FOR WHEEZING OR SHORTNESS OF BREATH 900 mL 1  . atorvastatin (LIPITOR) 40 MG tablet Take 40 mg by mouth daily.   2  . B Complex-C (SUPER B-C PO) Take 1 tablet by mouth daily at 12 noon.     Marland Kitchen buPROPion (WELLBUTRIN XL) 150 MG 24 hr tablet Take 300 mg by mouth daily.     . fluticasone (FLONASE) 50 MCG/ACT nasal spray Place 2 sprays into both nostrils daily as needed for allergies.   4  . losartan (COZAAR) 25 MG tablet TAKE 1 TABLET(25 MG) BY MOUTH DAILY 90 tablet 1  . metFORMIN (GLUCOPHAGE) 500 MG tablet Take 500 mg by mouth 2 (two) times daily with a meal.    . metoprolol succinate (TOPROL-XL) 25 MG 24 hr tablet TAKE 1 TABLET(25 MG) BY MOUTH DAILY 90 tablet 0  .  ondansetron (ZOFRAN ODT) 4 MG disintegrating tablet Take 1 tablet (4 mg total) by mouth every 8 (eight) hours as needed for nausea or vomiting. 10 tablet 0  . sertraline (ZOLOFT) 50 MG tablet Take 50 mg by mouth daily.    Marland Kitchen tiotropium (SPIRIVA) 18 MCG inhalation capsule Place 1 capsule (18 mcg total) into inhaler and inhale daily. 30 capsule 12  . vitamin B-12 (CYANOCOBALAMIN) 500 MCG tablet Take 500 mcg by mouth daily.    . cyclobenzaprine (FLEXERIL) 10 MG tablet Take 10 mg by mouth at bedtime as needed for muscle spasms.   0  . promethazine (PHENERGAN) 25 MG tablet Take 25 mg by mouth every 6 (six) hours as needed for nausea or vomiting. Reported on 04/12/2016    . traMADol (ULTRAM) 50 MG tablet Take 1-2 tablets (50-100 mg total) by mouth every 6 (six) hours as needed (mild pain).  (Patient not taking: Reported on 04/12/2016) 30 tablet 0   No current facility-administered medications for this visit.    Physical Exam BP 151/66 mmHg  Pulse 59  Resp 16  Ht 5\' 7"  (1.702 m)  Wt 199 lb (90.266 kg)  BMI 31.16 kg/m2  SpO60 81% 70 year old woman in no acute distress Alert and oriented 3 with no focal deficits Incisions healing well Cardiac regular rate and rhythm normal S1 and S2 Lungs with faint crackles at bases, no wheezing  Diagnostic Tests: CHEST 2 VIEW  COMPARISON: Chest x-ray of 03/26/2016, and CT chest of 03/22/2016  FINDINGS: Prominent interstitial markings primarily at the left lung base blood due to surgical intervention as well as history of chronic interstitial lung disease, i.e. usual interstitial pneumonia. The opacity noted posteriorly at the lung base on the lateral view may be due to scarring as well, but pneumonia cannot be excluded. The heart is mildly enlarged and stable. Peribronchial thickening is again noted. Anterior posterior fusion of the lower cervical spine is present. There are degenerative changes throughout the thoracic spine.  IMPRESSION: 1. Coarse prominent markings at the lung bases left-greater-than-right most likely are due to scarring from prior surgery as well as interstitial lung disease, but with more opacity on the lateral view posteriorly, active pneumonia cannot be excluded. 2. Stable cardiomegaly.   Electronically Signed  By: Ivar Drape M.D.  On: 04/12/2016 11:53  I personally reviewed the chest x-ray and findings are consistent with postbiopsy changes.  Pathology Usual interstitial pneumonia with lymphoid hyperplasia Lymph nodes benign  Impression: Mrs. Hickson is a 71 year old woman with mildly who had a lung biopsy on 03/23/2016. She has done extremely well postoperatively.  Biopsy showed UIP with lymphoid hyperplasia. She has not yet followed up with Dr. Chase Caller, but has an appointment  next week.  This point she is not taking any pain medication. I think she is fine to drive. I did advise her to use extra caution initially. There are no other restrictions on her activities.  Plan: Follow-up with Dr. Chase Caller.  I will be happy to see her back any time if I can be of any further assistance with her care.  Melrose Nakayama, MD [facility] [phone]

## 2016-04-13 ENCOUNTER — Telehealth: Payer: Self-pay | Admitting: Internal Medicine

## 2016-04-13 NOTE — Telephone Encounter (Signed)
She has UIP on lung bx. Can your get her in 04/14/16 to see me please

## 2016-04-13 NOTE — Telephone Encounter (Signed)
Pt rescheduled to tomorrow at 11:00.  Nothing further needed.

## 2016-04-14 ENCOUNTER — Ambulatory Visit (INDEPENDENT_AMBULATORY_CARE_PROVIDER_SITE_OTHER): Payer: Medicare Other | Admitting: Internal Medicine

## 2016-04-14 ENCOUNTER — Encounter: Payer: Self-pay | Admitting: Internal Medicine

## 2016-04-14 VITALS — BP 152/84 | HR 57 | Temp 97.8°F | Ht 67.5 in | Wt 202.8 lb

## 2016-04-14 DIAGNOSIS — J84112 Idiopathic pulmonary fibrosis: Secondary | ICD-10-CM | POA: Insufficient documentation

## 2016-04-14 DIAGNOSIS — J849 Interstitial pulmonary disease, unspecified: Secondary | ICD-10-CM | POA: Diagnosis not present

## 2016-04-14 NOTE — Progress Notes (Signed)
OV 12/11/2015  Chief Complaint  Patient presents with  . Follow-up    Pt states her breathing has improved since last OV, pt states she still has good days and bad days. Pt states she has noticed improvement in breathing since stopping the symbicort and starting the spiriva. Pt c/o mild prod cough with yellow mucus and states periodically she has pain under left breast at times when she coughs.     Follow-up dyspnea secondary to obesity, emphysema with alpha-1 MZ phenotype and interstitial lung disease not otherwise specified.   Last seen October 2016. At that time he stopped Symbicort. Added Spiriva. Treated her for COPD exacerbation. These measures have helped. Dyspnea is improved but she still does have dyspnea on exertion relieved by rest. It is mild-moderate in intensity. There was concern about associated interstitial lung disease based on CT chest. We performed our extensive autoimmune profile but this is been negative.   OV 03/04/2016 Chief Complaint  Patient presents with  . Follow-up    Pt states her breathing has worsened since last OV. Pt c/o midsternal CP when lying down. Pt c/o non prod cough with chest congestion.     Follow-up emphysema with alpha-1 MZ phenotype and interstitial lung disease idiopathic interstitial pneumonia IIP  Last seen January 2017. At that time I did a high-resolution CT chest 12/15/2015. It was indicative of possible UIP. In the presence of emphysema and age over 62 and negative autoimmune test this is a higher pretest probably for IPF. Nevertheless per ATS criteria biopsies indicated. She wanted to defer biopsy until she saw surgeon for ventral hernia repair. She did see Dr. Jackolyn Confer and she tells me that he does not want to do surgery because of lung disease. Currently hernia is under control. She did have a CT abdomen lung cut 01/28/2016 that still shows the presence of ILD. She is somewhat more open to having a surgical lung biopsy at this  visit. She did tell me that between 1997 and 2007 she lived in a house with full of mold. She specifically uses the word "black mold" an Aspergillus. She says during this and she was hospitalized with acute hypoxemic respiratory failure with pulse ox in the 40s. However this was all 10 years ago. Records from these are not available at this point.  For the last 6 weeks she is reporting slight increase in cough associated with yellow sputum and some increase in wheezing and chest tightness and shortness of breath. This is associated with exposure to sick contacts in the facility she lives. She says a lot of people there and the apartment complex got hospitalized for pneumonia.   OV 04/14/2016  Chief Complaint  Patient presents with  . Follow-up    breathing doing well.  discuss biopsy results.      Here to review surgical lung bx results 03/23/16 - read at [facility] by Dr Doyle Askew group - UIP final dx . Some lymphoid tissue but autoimmune test is negative. They felt sufficient to call it UIP. Given age, UIP, negative autoimmune, and associated some emphyema - will call this IPF. Explained IPF and its unpredictable course in detail. Explained therapies of esbriet v ofev as below highlights of discussion. Based on below she chose esbriet becauses he has IBS and does not want to deal with diarrhea risk  Both drugs OFEV and Esbriet only slow down progression, 1 out of 6 patients  - this means extension in quality of life but no difference in  symptoms  - no study directly compares the 2 drugs but efficacy roughly equal at 1 year time point   - OFEV  - - time to first exacerbation possibly reduced in one trial but not in another - twice daily, no titration, potentially more convenient dosing  - no need for sunscreen  - high chance of mild diarrhea but low chance of significant diarrhea needing to stop medication.   - Rx diarrhea with lomotil - slight increase in heart attack risk and theoretical increase  in bleeding risk,   - need monthly blood work for 3 months and then every 6 months - monitor liver function   - ESBRIET  - 3 pill three times daily, slow titration. (new 1 tab tid available) - Need to wean sunscreen  - Some chance of nausea and anorexia with small chance for diarrhea  - no known heart attack risk - no known bleeding risk,   - need monthly blood work for 6 months - monitor liver function - possible mortality benefit in pooled analysis  - larger world wide experience  Disclosure: Dr Chase Caller has participated in trials with Esbriet and has been compensated by Bluford Kaufmann / Celso Amy     A/P   ICD-9-CM ICD-10-CM   1. IPF (idiopathic pulmonary fibrosis) (HCC) 516.31 J84.112   2. ILD (interstitial lung disease) ([place]) 29 J84.9     New diagnosis of IPF based on biopsy 03/23/16  Plan Start esbriet - take product info and nurse to start paper work IPF education booklet - if we have it Meet Hoyle Sauer about IPF registry study  Followup 4 -6 weeks to review progress At followup will discuss alpha 1, rehab, transplant etc.    > 50% of this > 25 min visit spent in face to face counseling or coordination of care   DISCUSSION ONLY VIST   Dr. Brand Males, M.D., [facility].C.P Pulmonary and Critical Care Medicine Staff Physician [place] [facility] Pager: [phone], If no answer or between  15:00h - 7:00h: call [phone]  04/15/2016 12:40 AM

## 2016-04-14 NOTE — Patient Instructions (Signed)
ICD-9-CM ICD-10-CM   1. IPF (idiopathic pulmonary fibrosis) (HCC) 516.31 J84.112   2. ILD (interstitial lung disease) ([place]) 515 J84.9     New diagnosis of IPF based on biopsy 03/23/16  Plan Start esbriet - take product info and nurse to start paper work IPF education booklet - if we have it Meet Hoyle Sauer about IPF registry study  Followup 4 -6 weeks to review progress At followup will discuss alpha 1 etc

## 2016-04-15 ENCOUNTER — Telehealth: Payer: Self-pay | Admitting: Internal Medicine

## 2016-04-15 NOTE — Telephone Encounter (Signed)
Angela [name] is  A esbriet start 04/14/16. Sharyn Lull was informed about it as my CMA for the day. Please ensure paper work    Thanks  Dr. Brand Males, M.D., [facility].C.P Pulmonary and Critical Care Medicine Staff Physician [place] [facility] Pager: [phone], If no answer or between  15:00h - 7:00h: call [phone]  04/15/2016 12:36 AM

## 2016-04-18 ENCOUNTER — Ambulatory Visit: Payer: Medicare Other | Admitting: Internal Medicine

## 2016-04-18 NOTE — Telephone Encounter (Signed)
This application form has been started and is in MR's look-at for signature.

## 2016-05-06 ENCOUNTER — Telehealth: Payer: Self-pay | Admitting: Internal Medicine

## 2016-05-06 NOTE — Telephone Encounter (Signed)
Spoke with Cecille Rubin at [employer]  She states that she faxed PA form for esbriet last wk and is calling for an update  I went through his lookat and do not see this  I asked her to refax it  Will await fax

## 2016-05-11 NOTE — Telephone Encounter (Signed)
We still do not have this PA. I called [facility], they are going to refax this.

## 2016-05-12 ENCOUNTER — Other Ambulatory Visit: Payer: Self-pay | Admitting: Cardiology

## 2016-05-13 ENCOUNTER — Encounter: Payer: Self-pay | Admitting: Cardiology

## 2016-05-13 ENCOUNTER — Ambulatory Visit (INDEPENDENT_AMBULATORY_CARE_PROVIDER_SITE_OTHER): Payer: Medicare Other | Admitting: Cardiology

## 2016-05-13 VITALS — BP 138/82 | HR 54 | Ht 67.5 in | Wt 203.2 lb

## 2016-05-13 DIAGNOSIS — I10 Essential (primary) hypertension: Secondary | ICD-10-CM

## 2016-05-13 MED ORDER — METOPROLOL SUCCINATE ER 25 MG PO TB24: ORAL_TABLET | ORAL | Status: DC

## 2016-05-13 MED ORDER — HYDROCHLOROTHIAZIDE 12.5 MG PO CAPS: 12.5000 mg | ORAL_CAPSULE | Freq: Every day | ORAL | Status: DC

## 2016-05-13 NOTE — Progress Notes (Signed)
05/13/2016 Angela [name]   1946-07-31  [id number]  Primary Physician WEBB, Valla Leaver, MD Primary Cardiologist: Dr. Marlou Porch   Reason for Visit/CC: F/u for HTN  HPI:  70 y.o. female with diabetes, hypertension, hyperlipidemia, anxiety, chronic kidney disease here for the follow-up. She is followed by Dr. Marlou Porch. She had a low risk NST in 2009. She has a right sided carotid bruit, however doppler study in 2014 showed no significant disease bilaterally. In June 2016, she was seen in the [facility] ED for severe hypotension, with SBP in the 60s. This was in the setting of use of a muscle relaxer Tizanidine (added to allergy list).  We temporarily stopped her hydrochlorothiazide and amlodipine. She has had no further issues and has tolerated restart of metoprolol. Overall doing better. No further dizziness or syncope. She also has a h/o statin intolerance.   She presents back to clinic today for f/u. Since last OV with Dr. Marlou Porch, she was diagnosed with alpha-1 MZ phenotype and interstitial lung disease. This is being followed by Dr. Chase Caller.   From a cardiac standpoint, she notes that she has been doing fairly well. She denies any CP. She does feel that she needs to restart her HCTZ. She notes she tends to retain more fluid in the summer months. BP today is 138/82. She ask about use of Red Yeast Rice for cholesterol.   Current Outpatient Prescriptions  Medication Sig Dispense Refill  . acetaminophen (TYLENOL) 500 MG tablet Take 2 tablets (1,000 mg total) by mouth every 6 (six) hours as needed for mild pain or fever. 30 tablet 0  . albuterol (PROVENTIL) (2.5 MG/3ML) 0.083% nebulizer solution INAHLE CONTENTS OF ONE VIAL VIA NEBULIZER EVERY 6 HOURS AS NEEDED FOR WHEEZING OR SHORTNESS OF BREATH 900 mL 1  . B Complex-C (SUPER B-C PO) Take 1 tablet by mouth daily at 12 noon.     Marland Kitchen buPROPion (WELLBUTRIN XL) 300 MG 24 hr tablet Take 1 tablet by mouth every morning.    . cyclobenzaprine (FLEXERIL) 10 MG tablet Take  10 mg by mouth at bedtime as needed for muscle spasms.   0  . fluticasone (FLONASE) 50 MCG/ACT nasal spray Place 2 sprays into both nostrils daily as needed for allergies.   4  . losartan (COZAAR) 25 MG tablet TAKE 1 TABLET(25 MG) BY MOUTH DAILY 90 tablet 1  . metFORMIN (GLUCOPHAGE) 500 MG tablet Take 500 mg by mouth 2 (two) times daily with a meal.    . metoprolol succinate (TOPROL-XL) 25 MG 24 hr tablet TAKE 1 TABLET(25 MG) BY MOUTH DAILY 90 tablet 0  . ondansetron (ZOFRAN ODT) 4 MG disintegrating tablet Take 1 tablet (4 mg total) by mouth every 8 (eight) hours as needed for nausea or vomiting. 10 tablet 0  . promethazine (PHENERGAN) 25 MG tablet Take 25 mg by mouth every 6 (six) hours as needed for nausea or vomiting. Reported on 04/12/2016    . sertraline (ZOLOFT) 50 MG tablet Take 50 mg by mouth daily.    Marland Kitchen tiotropium (SPIRIVA) 18 MCG inhalation capsule Place 1 capsule (18 mcg total) into inhaler and inhale daily. 30 capsule 12  . traMADol (ULTRAM) 50 MG tablet Take 1-2 tablets (50-100 mg total) by mouth every 6 (six) hours as needed (mild pain). 30 tablet 0  . vitamin B-12 (CYANOCOBALAMIN) 500 MCG tablet Take 500 mcg by mouth daily.     No current facility-administered medications for this visit.    Allergies  Allergen Reactions  .  Codeine Nausea And Vomiting  . Iohexol      Desc: Pt states several years ago during a CT scan w/ iv cotnrast she had severe nausea and vomiting, sob w/ difficulty breathing and swallowing.  She had full 13hr. premeds today (5/14/7) and did fine w/o complications.   . Tizanidine Other (See Comments)    Severe decrease in BP   . Ciprofloxacin Rash    Social History   Social History  . Marital Status: Divorced    Spouse Name: N/A  . Number of Children: 2  . Years of Education: N/A   Occupational History  . retired    Social History Main Topics  . Smoking status: Former Smoker -- 1.00 packs/day for 22 years    Types: Cigarettes    Quit date:  04/14/2012  . Smokeless tobacco: Never Used     Comment: Quit 1 year ago in June 2013.    Marland Kitchen Alcohol Use: No  . Drug Use: No  . Sexual Activity: Yes    Birth Control/ Protection: None   Other Topics Concern  . Not on file   Social History Narrative     Review of Systems: General: negative for chills, fever, night sweats or weight changes.  Cardiovascular: negative for chest pain, dyspnea on exertion, edema, orthopnea, palpitations, paroxysmal nocturnal dyspnea or shortness of breath Dermatological: negative for rash Respiratory: negative for cough or wheezing Urologic: negative for hematuria Abdominal: negative for nausea, vomiting, diarrhea, bright red blood per rectum, melena, or hematemesis Neurologic: negative for visual changes, syncope, or dizziness All other systems reviewed and are otherwise negative except as noted above.    Blood pressure 138/82, pulse 54, height 5' 7.5" (1.715 m), weight 203 lb 3.2 oz (92.171 kg).  General appearance: alert, cooperative and no distress Neck: no carotid bruit and no JVD Lungs: decreased BS bilaterally Heart: regular rate and rhythm, S1, S2 normal, no murmur, click, rub or gallop Extremities: trace bilateral LEE Pulses: 2+ and symmetric Skin: warm and dry Neurologic: Grossly normal  EKG NSR.   ASSESSMENT AND PLAN:   1. HTN: no further hypotension after discontinuation of Tizanidine (added to allergy list). She has tolerated metoprolol and losartan well w/o issues. She was previously on HCTZ that was discontinued after her medication induced hypotension with Tizanidine. She is requesting to restart this as she feels that she is retaining more fluid (trace LEE on exam) and BP is a bit higher than normal. Will add back low dose 12.5 mg daily. Continue losartan. Check f/u BMP in 1 week.   2. Resting Bradycardia: HR in the 50s, but she is on BB therapy for HTN. She is asymptomatic.   3. Alpha-1 MZ phenotype and interstitial lung  disease:  New diagnosis. This is being followed by Dr. Chase Caller.   4. H/o HLD: intolerant to statins. Patient wanting to try Red Yeast Rice. I suggested referral to our Lipid clinic for PCSK9 inhibitor but she does not wish to consider this.   PLAN  F/u with Dr. Marlou Porch in 6 months   Kristee Angus [facility] PA-C 05/13/2016 11:10 AM

## 2016-05-13 NOTE — Patient Instructions (Signed)
Medication Instructions:  1) START Hydrachlorathiazide 12.5mg  once daily  Labwork: Your physician recommends that you return for lab work in: 1 week (BMET)   Testing/Procedures: None  Follow-Up: Your physician wants you to follow-up in: 6 months with Dr. Marlou Porch. You will receive a reminder letter in the mail two months in advance. If you don't receive a letter, please call our office to schedule the follow-up appointment.    Any Other Special Instructions Will Be Listed Below (If Applicable).     If you need a refill on your cardiac medications before your next appointment, please call your pharmacy.

## 2016-05-13 NOTE — Telephone Encounter (Signed)
PA has been received and faxed back to [facility]. Will forward to Ashley's box for follow up.

## 2016-05-13 NOTE — Telephone Encounter (Signed)
metoprolol succinate (TOPROL-XL) 25 MG 24 hr tablet  Medication   Date: 05/13/2016  Department: [address] Office  Ordering/Authorizing: Consuelo Pandy, PA-C      Order Providers    Prescribing Provider Encounter Provider   Brittainy Erie Noe, PA-C Brittainy Erie Noe, PA-C    Medication Detail      Disp Refills Start End     metoprolol succinate (TOPROL-XL) 25 MG 24 hr tablet 90 tablet 3 05/13/2016     Sig: TAKE 1 TABLET(25 MG) BY MOUTH DAILY    E-Prescribing Status: Receipt confirmed by pharmacy (05/13/2016 11:33 AM EDT)     Pharmacy    [id number] - [place], [address] AT [facility] OF [address] [place]

## 2016-05-13 NOTE — Telephone Encounter (Signed)
metoprolol succinate (TOPROL-XL) 25 MG 24 hr tablet  Medication   Date: 04/08/2016  Department: [address] Office  Ordering/Authorizing: Jerline Pain, MD      Order Providers    Prescribing Provider Encounter Provider   Jerline Pain, MD Jerline Pain, MD    Medication Detail      Disp Refills Start End     metoprolol succinate (TOPROL-XL) 25 MG 24 hr tablet 90 tablet 0 04/08/2016     Sig: TAKE 1 TABLET(25 MG) BY MOUTH DAILY    Notes to Pharmacy: Please call [phone] to schedule appointment for additional refills thanks.    E-Prescribing Status: Receipt confirmed by pharmacy (04/08/2016 10:43 AM EDT)     Pharmacy    [id number] - [place], [address] AT [address] [place] [address]   Has enough medication until aug, will place refills on file.

## 2016-05-18 NOTE — Telephone Encounter (Signed)
Received fax on PA for esbriet was approved through 11/13/16 under medicare part D. [place] and made aware. Nothing further needed

## 2016-05-20 ENCOUNTER — Other Ambulatory Visit: Payer: Medicare Other

## 2016-05-31 ENCOUNTER — Other Ambulatory Visit: Payer: Self-pay

## 2016-05-31 MED ORDER — PIRFENIDONE 267 MG PO CAPS: 3.0000 | ORAL_CAPSULE | Freq: Three times a day (TID) | ORAL | Status: DC

## 2016-06-01 ENCOUNTER — Other Ambulatory Visit (INDEPENDENT_AMBULATORY_CARE_PROVIDER_SITE_OTHER): Payer: Medicare Other

## 2016-06-01 ENCOUNTER — Ambulatory Visit (INDEPENDENT_AMBULATORY_CARE_PROVIDER_SITE_OTHER): Payer: Medicare Other | Admitting: Internal Medicine

## 2016-06-01 ENCOUNTER — Encounter: Payer: Self-pay | Admitting: Internal Medicine

## 2016-06-01 VITALS — BP 132/68 | HR 77 | Ht 67.5 in | Wt 207.4 lb

## 2016-06-01 DIAGNOSIS — J439 Emphysema, unspecified: Secondary | ICD-10-CM

## 2016-06-01 DIAGNOSIS — Z5181 Encounter for therapeutic drug level monitoring: Secondary | ICD-10-CM

## 2016-06-01 DIAGNOSIS — J84112 Idiopathic pulmonary fibrosis: Secondary | ICD-10-CM | POA: Diagnosis not present

## 2016-06-01 DIAGNOSIS — E8801 Alpha-1-antitrypsin deficiency: Secondary | ICD-10-CM

## 2016-06-01 LAB — HEPATIC FUNCTION PANEL
ALBUMIN: 4.1 g/dL (ref 3.5–5.2)
ALT: 19 U/L (ref 0–35)
AST: 17 U/L (ref 0–37)
Alkaline Phosphatase: 121 U/L — ABNORMAL HIGH (ref 39–117)
BILIRUBIN TOTAL: 0.2 mg/dL (ref 0.2–1.2)
Bilirubin, Direct: 0 mg/dL (ref 0.0–0.3)
Total Protein: 7 g/dL (ref 6.0–8.3)

## 2016-06-01 NOTE — Patient Instructions (Signed)
ICD-9-CM ICD-10-CM   1. IPF (idiopathic pulmonary fibrosis) (HCC) 516.31 J84.112   2. Encounter for therapeutic drug monitoring V58.83 Z51.81   3. Alpha-1-antitrypsin deficiency ([place]) 273.4 E88.01   4. Pulmonary emphysema, unspecified emphysema type ([place]) 492.8 J43.9    Stable disease Fatigue and mild diarrhea could be due to esbriet  Plan  - continue esbriet and spiriva  - check lft today  - will refer to PFF foundation support group  - will refer to IPF registry study; appreciate any willingness to participate in voluntary research  Followup  -= 1 month LFT check  - 1 month to see me or An APP for esbriet monitoring  - will consider spirometry timing at followup

## 2016-06-01 NOTE — Progress Notes (Signed)
Subjective:     Patient ID: Angela [name], female   DOB: Mar 17, 1946, 70 y.o.   MRN: [id number]  HPI    OV 12/11/2015  Chief Complaint  Patient presents with  . Follow-up    Pt states her breathing has improved since last OV, pt states she still has good days and bad days. Pt states she has noticed improvement in breathing since stopping the symbicort and starting the spiriva. Pt c/o mild prod cough with yellow mucus and states periodically she has pain under left breast at times when she coughs.     Follow-up dyspnea secondary to obesity, emphysema with alpha-1 MZ phenotype and interstitial lung disease not otherwise specified.   Last seen October 2016. At that time he stopped Symbicort. Added Spiriva. Treated her for COPD exacerbation. These measures have helped. Dyspnea is improved but she still does have dyspnea on exertion relieved by rest. It is mild-moderate in intensity. There was concern about associated interstitial lung disease based on CT chest. We performed our extensive autoimmune profile but this is been negative.   OV 03/04/2016 Chief Complaint  Patient presents with  . Follow-up    Pt states her breathing has worsened since last OV. Pt c/o midsternal CP when lying down. Pt c/o non prod cough with chest congestion.     Follow-up emphysema with alpha-1 MZ phenotype and interstitial lung disease idiopathic interstitial pneumonia IIP  Last seen January 2017. At that time I did a high-resolution CT chest 12/15/2015. It was indicative of possible UIP. In the presence of emphysema and age over 92 and negative autoimmune test this is a higher pretest probably for IPF. Nevertheless per ATS criteria biopsies indicated. She wanted to defer biopsy until she saw surgeon for ventral hernia repair. She did see Dr. Jackolyn Confer and she tells me that he does not want to do surgery because of lung disease. Currently hernia is under control. She did have a CT abdomen lung cut 01/28/2016 that  still shows the presence of ILD. She is somewhat more open to having a surgical lung biopsy at this visit. She did tell me that between 1997 and 2007 she lived in a house with full of mold. She specifically uses the word "black mold" an Aspergillus. She says during this and she was hospitalized with acute hypoxemic respiratory failure with pulse ox in the 40s. However this was all 10 years ago. Records from these are not available at this point.  For the last 6 weeks she is reporting slight increase in cough associated with yellow sputum and some increase in wheezing and chest tightness and shortness of breath. This is associated with exposure to sick contacts in the facility she lives. She says a lot of people there and the apartment complex got hospitalized for pneumonia.   OV 04/14/2016  Chief Complaint  Patient presents with  . Follow-up    breathing doing well.  discuss biopsy results.    Here to review surgical lung bx results 03/23/16 - read at [facility] by Dr Doyle Askew group - UIP final dx . Some lymphoid tissue but autoimmune test is negative. They felt sufficient to call it UIP. Given age, UIP, negative autoimmune, and associated some emphyema - will call this IPF. Explained IPF and its unpredictable course in detail. Explained therapies of esbriet v ofev as below highlights of discussion. Based on below she chose esbriet becauses he has IBS and does not want to deal with diarrhea risk    OV  06/01/2016  Chief Complaint  Patient presents with  . Follow-up    pt c/o occasional nausea, diarhhea on Esbriet.  Pt states this is intermittent.  Pt has been on Esbriet X2 weeks.    Follow-up idiopathic pulmonary fibrosis diagnosis made on pathology 03/23/2016 with me giving her the results as IPF on 04/14/2016. Pirfenidone (Esbriet) was started but she's been on it only for 2 weeks. She thinks she is having some increased fatigue with this could also be due to insomnia because she is has a hobby of  doing jewelry at night late in the early hours of morning. She's having some increased diarrhea but she admits that this is because of background irritable bowel syndrome. It is only mild and does not bother. There is no nausea but she thinks she might be getting one. So far she's been tolerating his. Quite okay other than the above. No sun burn but she is not fully compliant with sunscreen which have emphasized. She is interested in IPF registry. She is also okay with referral to fibrosis foundation support group       has a past medical history of Diabetes mellitus; Hypertension; Depression; DDD (degenerative disc disease); Fibromyalgia; COPD (chronic obstructive pulmonary disease) ([place]); Sleep apnea; Diverticulitis; Diverticulosis; Esophageal dysmotility (03/01/06); Anemia (2014); Complication of anesthesia; Chronic kidney disease; Shingles; Stroke ([place]) (2010); Shortness of breath dyspnea; and Alpha 1-antitrypsin PiMS phenotype.   reports that she quit smoking about 4 years ago. Her smoking use included Cigarettes. She has a 22 pack-year smoking history. She has never used smokeless tobacco.  Past Surgical History  Procedure Laterality Date  . Appendectomy    . Tonsillectomy    . Gastric fundoplication    . Back surgery    . Hernia repair    . Knee arthroscopy      left   . De quervain's release      right  . Anterior fusion cervical spine    . Abdominal hysterectomy    . Posterior cervical fusion/foraminotomy N/A 03/16/2015    Procedure: POSTERIOR CERVICAL LAMINECTOMY AND DISKECTOMOY AT CERVICAL SEVEN-THORACIC ONE ;POSTERIOR CERVICAL FUSION CERVICAL SIX TO THORACIC ONE;  Surgeon: Kary Kos, MD;  Location: [place] [facility];  Service: Neurosurgery;  Laterality: N/A;  Site is Cervical/Thoracic  . Video assisted thoracoscopy Left 03/23/2016    Procedure: VIDEO ASSISTED THORACOSCOPY;  Surgeon: Melrose Nakayama, MD;  Location: [place];  Service: Thoracic;  Laterality: Left;  . Lung biopsy Left  03/23/2016    Procedure: LUNG BIOPSY;  Surgeon: Melrose Nakayama, MD;  Location: [place];  Service: Thoracic;  Laterality: Left;  . Lymph node biopsy Left 03/23/2016    Procedure: LYMPH NODE BIOPSY;  Surgeon: Melrose Nakayama, MD;  Location: [place];  Service: Thoracic;  Laterality: Left;    Allergies  Allergen Reactions  . Codeine Nausea And Vomiting  . Iohexol      Desc: Pt states several years ago during a CT scan w/ iv cotnrast she had severe nausea and vomiting, sob w/ difficulty breathing and swallowing.  She had full 13hr. premeds today (5/14/7) and did fine w/o complications.   . Tizanidine Other (See Comments)    Severe decrease in BP   . Ciprofloxacin Rash    Immunization History  Administered Date(s) Administered  . Pneumococcal-Unspecified 11/14/2012    Family History  Problem Relation Age of Onset  . COPD Father   . Colon cancer Maternal Aunt   . Stomach cancer Maternal Grandfather  Current outpatient prescriptions:  .  acetaminophen (TYLENOL) 500 MG tablet, Take 2 tablets (1,000 mg total) by mouth every 6 (six) hours as needed for mild pain or fever., Disp: 30 tablet, Rfl: 0 .  albuterol (PROVENTIL) (2.5 MG/3ML) 0.083% nebulizer solution, INAHLE CONTENTS OF ONE VIAL VIA NEBULIZER EVERY 6 HOURS AS NEEDED FOR WHEEZING OR SHORTNESS OF BREATH, Disp: 900 mL, Rfl: 1 .  B Complex-C (SUPER B-C PO), Take 1 tablet by mouth daily at 12 noon. , Disp: , Rfl:  .  buPROPion (WELLBUTRIN XL) 300 MG 24 hr tablet, Take 1 tablet by mouth every morning., Disp: , Rfl:  .  cyclobenzaprine (FLEXERIL) 10 MG tablet, Take 10 mg by mouth at bedtime as needed for muscle spasms. , Disp: , Rfl: 0 .  fluticasone (FLONASE) 50 MCG/ACT nasal spray, Place 2 sprays into both nostrils daily as needed for allergies. , Disp: , Rfl: 4 .  hydrochlorothiazide (MICROZIDE) 12.5 MG capsule, Take 1 capsule (12.5 mg total) by mouth daily., Disp: 90 capsule, Rfl: 3 .  losartan (COZAAR) 25 MG tablet, TAKE 1  TABLET(25 MG) BY MOUTH DAILY, Disp: 90 tablet, Rfl: 1 .  metFORMIN (GLUCOPHAGE) 500 MG tablet, Take 500 mg by mouth 2 (two) times daily with a meal., Disp: , Rfl:  .  metoprolol succinate (TOPROL-XL) 25 MG 24 hr tablet, TAKE 1 TABLET(25 MG) BY MOUTH DAILY, Disp: 90 tablet, Rfl: 3 .  ondansetron (ZOFRAN ODT) 4 MG disintegrating tablet, Take 1 tablet (4 mg total) by mouth every 8 (eight) hours as needed for nausea or vomiting., Disp: 10 tablet, Rfl: 0 .  Pirfenidone (ESBRIET) 267 MG CAPS, Take 3 capsules by mouth 3 (three) times daily. (Patient taking differently: Take 2 capsules by mouth 3 (three) times daily. ), Disp: 270 capsule, Rfl: 11 .  promethazine (PHENERGAN) 25 MG tablet, Take 25 mg by mouth every 6 (six) hours as needed for nausea or vomiting. Reported on 04/12/2016, Disp: , Rfl:  .  sertraline (ZOLOFT) 50 MG tablet, Take 50 mg by mouth daily., Disp: , Rfl:  .  tiotropium (SPIRIVA) 18 MCG inhalation capsule, Place 1 capsule (18 mcg total) into inhaler and inhale daily., Disp: 30 capsule, Rfl: 12 .  traMADol (ULTRAM) 50 MG tablet, Take 1-2 tablets (50-100 mg total) by mouth every 6 (six) hours as needed (mild pain)., Disp: 30 tablet, Rfl: 0 .  vitamin B-12 (CYANOCOBALAMIN) 500 MCG tablet, Take 500 mcg by mouth daily., Disp: , Rfl:     Review of Systems     Objective:   Physical Exam  Constitutional: She is oriented to person, place, and time. She appears well-developed and well-nourished. No distress.  HENT:  Head: Normocephalic and atraumatic.  Right Ear: External ear normal.  Left Ear: External ear normal.  Mouth/Throat: Oropharynx is clear and moist. No oropharyngeal exudate.  Eyes: Conjunctivae and EOM are normal. Pupils are equal, round, and reactive to light. Right eye exhibits no discharge. Left eye exhibits no discharge. No scleral icterus.  Neck: Normal range of motion. Neck supple. No JVD present. No tracheal deviation present. No thyromegaly present.  Cardiovascular:  Normal rate, regular rhythm, normal heart sounds and intact distal pulses.  Exam reveals no gallop and no friction rub.   No murmur heard. Pulmonary/Chest: Effort normal. No respiratory distress. She has no wheezes. She has rales. She exhibits no tenderness.  Abdominal: Soft. Bowel sounds are normal. She exhibits no distension and no mass. There is no tenderness. There is no rebound and  no guarding.  Musculoskeletal: Normal range of motion. She exhibits no edema or tenderness.  Lymphadenopathy:    She has no cervical adenopathy.  Neurological: She is alert and oriented to person, place, and time. She has normal reflexes. No cranial nerve deficit. She exhibits normal muscle tone. Coordination normal.  Skin: Skin is warm and dry. No rash noted. She is not diaphoretic. No erythema. No pallor.  Psychiatric: She has a normal mood and affect. Her behavior is normal. Judgment and thought content normal.  Vitals reviewed.   Filed Vitals:   06/01/16 1442  BP: 132/68  Pulse: 77  Height: 5' 7.5" (1.715 m)  Weight: 207 lb 6.4 oz (94.076 kg)  SpO2: 96%   Estimated body mass index is 31.99 kg/(m^2) as calculated from the following:   Height as of this encounter: 5' 7.5" (1.715 m).   Weight as of this encounter: 207 lb 6.4 oz (94.076 kg).      Assessment:       ICD-9-CM ICD-10-CM   1. IPF (idiopathic pulmonary fibrosis) (HCC) 516.31 J84.112   2. Encounter for therapeutic drug monitoring V58.83 Z51.81   3. Alpha-1-antitrypsin deficiency ([place]) 273.4 E88.01   4. Pulmonary emphysema, unspecified emphysema type ([place]) 492.8 J43.9        Plan:      Stable disease Fatigue and mild diarrhea could be due to [place]  - continue esbriet and spiriva  - check lft today  - will refer to PFF foundation support group  - will refer to IPF registry study; appreciate any willingness to participate in voluntary research - At follow-up we will do an discuss referral to pulmonary  rehabilitation.  Followup  -= 1 month LFT check  - 1 month to see me or An APP for esbriet monitoring  - will consider spirometry timing at followup - Alpha one replacement if lung function is declining but then it'll be hard to say if it is due to emphysema or pulmonary fibrosis   > 50% of this > 25 min visit spent in face to face counseling or coordination of care \  Dr. Brand Males, M.D., [facility]P Pulmonary and Critical Care Medicine Staff Physician [facility] Pager: [phone], If no answer or between  15:00h - 7:00h: call [phone]  06/01/2016 3:14 PM

## 2016-06-01 NOTE — Addendum Note (Signed)
Addended by: Len Blalock on: 06/01/2016 03:22 PM   Modules accepted: Orders

## 2016-06-13 ENCOUNTER — Telehealth: Payer: Self-pay | Admitting: Internal Medicine

## 2016-06-13 NOTE — Telephone Encounter (Signed)
Spoke with pt, states that she has been on maintenance dose of Esbriet X1 week (267mg  3 tabs TID), since titrating up to maintenance dose pt c/o excessive sleepiness.  Pt states this did not bother her at previous doses.  Denies any breathing complaints or GI side effects.   Pt would like recs- wants to know if she can titrate back down to 2 tabs TID.  Pt does not wish to wait until Friday when MR returns for recs.  Sending to DOD as MR is unavailable. RA please advise.  Thanks!

## 2016-06-13 NOTE — Telephone Encounter (Signed)
Not a known side effect of this medication Suggest-stay at this dose for a few days to see if this effect passes If it does not then she may have to dial down to 2 tablets

## 2016-06-13 NOTE — Telephone Encounter (Signed)
Spoke with pt. She is aware of RA's recommendation. Nothing further was needed.

## 2016-07-07 ENCOUNTER — Ambulatory Visit (INDEPENDENT_AMBULATORY_CARE_PROVIDER_SITE_OTHER): Payer: Medicare Other | Admitting: Internal Medicine

## 2016-07-07 ENCOUNTER — Other Ambulatory Visit (INDEPENDENT_AMBULATORY_CARE_PROVIDER_SITE_OTHER): Payer: Medicare Other

## 2016-07-07 ENCOUNTER — Encounter: Payer: Self-pay | Admitting: Internal Medicine

## 2016-07-07 VITALS — BP 138/64 | HR 60 | Ht 67.5 in | Wt 210.0 lb

## 2016-07-07 DIAGNOSIS — J439 Emphysema, unspecified: Secondary | ICD-10-CM

## 2016-07-07 DIAGNOSIS — J84112 Idiopathic pulmonary fibrosis: Secondary | ICD-10-CM | POA: Diagnosis not present

## 2016-07-07 DIAGNOSIS — Z5181 Encounter for therapeutic drug level monitoring: Secondary | ICD-10-CM | POA: Insufficient documentation

## 2016-07-07 DIAGNOSIS — E8801 Alpha-1-antitrypsin deficiency: Secondary | ICD-10-CM | POA: Diagnosis not present

## 2016-07-07 LAB — HEPATIC FUNCTION PANEL
ALBUMIN: 3.9 g/dL (ref 3.5–5.2)
ALK PHOS: 85 U/L (ref 39–117)
ALT: 14 U/L (ref 0–35)
AST: 12 U/L (ref 0–37)
Bilirubin, Direct: 0 mg/dL (ref 0.0–0.3)
TOTAL PROTEIN: 6.6 g/dL (ref 6.0–8.3)
Total Bilirubin: 0.2 mg/dL (ref 0.2–1.2)

## 2016-07-07 NOTE — Progress Notes (Signed)
Subjective:     Patient ID: Angela [name], female   DOB: Aug 07, 1946, 70 y.o.   MRN: [id number]  HPI   OV 12/11/2015  Chief Complaint  Patient presents with  . Follow-up    Pt states her breathing has improved since last OV, pt states she still has good days and bad days. Pt states she has noticed improvement in breathing since stopping the symbicort and starting the spiriva. Pt c/o mild prod cough with yellow mucus and states periodically she has pain under left breast at times when she coughs.     Follow-up dyspnea secondary to obesity, emphysema with alpha-1 MZ phenotype and interstitial lung disease not otherwise specified.   Last seen October 2016. At that time he stopped Symbicort. Added Spiriva. Treated her for COPD exacerbation. These measures have helped. Dyspnea is improved but she still does have dyspnea on exertion relieved by rest. It is mild-moderate in intensity. There was concern about associated interstitial lung disease based on CT chest. We performed our extensive autoimmune profile but this is been negative.   OV 03/04/2016 Chief Complaint  Patient presents with  . Follow-up    Pt states her breathing has worsened since last OV. Pt c/o midsternal CP when lying down. Pt c/o non prod cough with chest congestion.     Follow-up emphysema with alpha-1 MZ phenotype and interstitial lung disease idiopathic interstitial pneumonia IIP  Last seen January 2017. At that time I did a high-resolution CT chest 12/15/2015. It was indicative of possible UIP. In the presence of emphysema and age over 76 and negative autoimmune test this is a higher pretest probably for IPF. Nevertheless per ATS criteria biopsies indicated. She wanted to defer biopsy until she saw surgeon for ventral hernia repair. She did see Dr. Jackolyn Confer and she tells me that he does not want to do surgery because of lung disease. Currently hernia is under control. She did have a CT abdomen lung cut 01/28/2016 that  still shows the presence of ILD. She is somewhat more open to having a surgical lung biopsy at this visit. She did tell me that between 1997 and 2007 she lived in a house with full of mold. She specifically uses the word "black mold" an Aspergillus. She says during this and she was hospitalized with acute hypoxemic respiratory failure with pulse ox in the 40s. However this was all 10 years ago. Records from these are not available at this point.  For the last 6 weeks she is reporting slight increase in cough associated with yellow sputum and some increase in wheezing and chest tightness and shortness of breath. This is associated with exposure to sick contacts in the facility she lives. She says a lot of people there and the apartment complex got hospitalized for pneumonia.   OV 04/14/2016  Chief Complaint  Patient presents with  . Follow-up    breathing doing well.  discuss biopsy results.    Here to review surgical lung bx results 03/23/16 - read at [facility] by Dr Doyle Askew group - UIP final dx . Some lymphoid tissue but autoimmune test is negative. They felt sufficient to call it UIP. Given age, UIP, negative autoimmune, and associated some emphyema - will call this IPF. Explained IPF and its unpredictable course in detail. Explained therapies of esbriet v ofev as below highlights of discussion. Based on below she chose esbriet becauses he has IBS and does not want to deal with diarrhea risk    OV 06/01/2016  Chief Complaint  Patient presents with  . Follow-up    pt c/o occasional nausea, diarhhea on Esbriet.  Pt states this is intermittent.  Pt has been on Esbriet X2 weeks.    Follow-up idiopathic pulmonary fibrosis diagnosis made on pathology 03/23/2016 with me giving her the results as IPF on 04/14/2016. Pirfenidone (Esbriet) was started but she's been on it only for 2 weeks. She thinks she is having some increased fatigue with this could also be due to insomnia because she is has a hobby of  doing jewelry at night late in the early hours of morning. She's having some increased diarrhea but she admits that this is because of background irritable bowel syndrome. It is only mild and does not bother. There is no nausea but she thinks she might be getting one. So far she's been tolerating his. Quite okay other than the above. No sun burn but she is not fully compliant with sunscreen which have emphasized. She is interested in IPF registry. She is also okay with referral to fibrosis foundation support group   OV 07/07/16  Chief Complaint  Patient presents with  . Follow-up    Pt states the diarrhea is no longer constant and is more intermittent, pt states after she takes a dose of Esbriet she becomes sleepy. Pt states her SOB has improved and now has more energy. Pt denies CP/tightness.     Follow-up idiopathic pulmonary fibrosis associated with alpha-1 MZ emphysema. She is now on Pirfenidone (Esbriet) since May/June 2017. This is mildly follow-up. She has fatigue and sleepiness after the first pill. Thyroid is improved but on and off. Dyspnea is improved. Walking desaturation test 185 feet 3 laps on room air Lowest pulse ox was 91%: Edema is baseline. She's a spirometry fibrosis foundation support group has not contacted her yet. She is interested in IPF registry.        has a past medical history of Alpha 1-antitrypsin PiMS phenotype; Anemia (2014); Chronic kidney disease; Complication of anesthesia; COPD (chronic obstructive pulmonary disease) ([place]); DDD (degenerative disc disease); Depression; Diabetes mellitus; Diverticulitis; Diverticulosis; Esophageal dysmotility (03/01/06); Fibromyalgia; Hypertension; Shingles; Shortness of breath dyspnea; Sleep apnea; and Stroke ([place]) (2010).   reports that she quit smoking about 4 years ago. Her smoking use included Cigarettes. She has a 22.00 pack-year smoking history. She has never used smokeless tobacco.  Past Surgical History:  Procedure  Laterality Date  . ABDOMINAL HYSTERECTOMY    . ANTERIOR FUSION CERVICAL SPINE    . APPENDECTOMY    . BACK SURGERY    . DE QUERVAIN'S RELEASE     right  . GASTRIC FUNDOPLICATION    . HERNIA REPAIR    . KNEE ARTHROSCOPY     left   . LUNG BIOPSY Left 03/23/2016   Procedure: LUNG BIOPSY;  Surgeon: Melrose Nakayama, MD;  Location: [place];  Service: Thoracic;  Laterality: Left;  . LYMPH NODE BIOPSY Left 03/23/2016   Procedure: LYMPH NODE BIOPSY;  Surgeon: Melrose Nakayama, MD;  Location: [place];  Service: Thoracic;  Laterality: Left;  . POSTERIOR CERVICAL FUSION/FORAMINOTOMY N/A 03/16/2015   Procedure: POSTERIOR CERVICAL LAMINECTOMY AND DISKECTOMOY AT CERVICAL SEVEN-THORACIC ONE ;POSTERIOR CERVICAL FUSION CERVICAL SIX TO THORACIC ONE;  Surgeon: Kary Kos, MD;  Location: [facility];  Service: Neurosurgery;  Laterality: N/A;  Site is Cervical/Thoracic  . TONSILLECTOMY    . VIDEO ASSISTED THORACOSCOPY Left 03/23/2016   Procedure: VIDEO ASSISTED THORACOSCOPY;  Surgeon: Melrose Nakayama, MD;  Location: [facility]  OR;  Service: Thoracic;  Laterality: Left;    Allergies  Allergen Reactions  . Codeine Nausea And Vomiting  . Iohexol      Desc: Pt states several years ago during a CT scan w/ iv cotnrast she had severe nausea and vomiting, sob w/ difficulty breathing and swallowing.  She had full 13hr. premeds today (5/14/7) and did fine w/o complications.   . Tizanidine Other (See Comments)    Severe decrease in BP   . Ciprofloxacin Rash    Immunization History  Administered Date(s) Administered  . Pneumococcal-Unspecified 11/14/2012    Family History  Problem Relation Age of Onset  . COPD Father   . Colon cancer Maternal Aunt   . Stomach cancer Maternal Grandfather      Current Outpatient Prescriptions:  .  acetaminophen (TYLENOL) 500 MG tablet, Take 2 tablets (1,000 mg total) by mouth every 6 (six) hours as needed for mild pain or fever., Disp: 30 tablet, Rfl: 0 .  albuterol  (PROVENTIL) (2.5 MG/3ML) 0.083% nebulizer solution, INAHLE CONTENTS OF ONE VIAL VIA NEBULIZER EVERY 6 HOURS AS NEEDED FOR WHEEZING OR SHORTNESS OF BREATH, Disp: 900 mL, Rfl: 1 .  B Complex-C (SUPER B-C PO), Take 1 tablet by mouth daily at 12 noon. , Disp: , Rfl:  .  buPROPion (WELLBUTRIN XL) 300 MG 24 hr tablet, Take 1 tablet by mouth every morning., Disp: , Rfl:  .  cyclobenzaprine (FLEXERIL) 10 MG tablet, Take 10 mg by mouth at bedtime as needed for muscle spasms. , Disp: , Rfl: 0 .  fluticasone (FLONASE) 50 MCG/ACT nasal spray, Place 2 sprays into both nostrils daily as needed for allergies. , Disp: , Rfl: 4 .  hydrochlorothiazide (MICROZIDE) 12.5 MG capsule, Take 1 capsule (12.5 mg total) by mouth daily., Disp: 90 capsule, Rfl: 3 .  losartan (COZAAR) 25 MG tablet, TAKE 1 TABLET(25 MG) BY MOUTH DAILY, Disp: 90 tablet, Rfl: 1 .  metFORMIN (GLUCOPHAGE) 500 MG tablet, Take 500 mg by mouth 2 (two) times daily with a meal., Disp: , Rfl:  .  metoprolol succinate (TOPROL-XL) 25 MG 24 hr tablet, TAKE 1 TABLET(25 MG) BY MOUTH DAILY, Disp: 90 tablet, Rfl: 3 .  ondansetron (ZOFRAN ODT) 4 MG disintegrating tablet, Take 1 tablet (4 mg total) by mouth every 8 (eight) hours as needed for nausea or vomiting., Disp: 10 tablet, Rfl: 0 .  Pirfenidone (ESBRIET) 267 MG CAPS, Take 3 capsules by mouth 3 (three) times daily., Disp: 270 capsule, Rfl: 11 .  promethazine (PHENERGAN) 25 MG tablet, Take 25 mg by mouth every 6 (six) hours as needed for nausea or vomiting. Reported on 04/12/2016, Disp: , Rfl:  .  sertraline (ZOLOFT) 50 MG tablet, Take 50 mg by mouth daily., Disp: , Rfl:  .  tiotropium (SPIRIVA) 18 MCG inhalation capsule, Place 1 capsule (18 mcg total) into inhaler and inhale daily., Disp: 30 capsule, Rfl: 12 .  traMADol (ULTRAM) 50 MG tablet, Take 1-2 tablets (50-100 mg total) by mouth every 6 (six) hours as needed (mild pain)., Disp: 30 tablet, Rfl: 0 .  vitamin B-12 (CYANOCOBALAMIN) 500 MCG tablet, Take 500 mcg  by mouth daily., Disp: , Rfl:      Review of Systems     Objective:   Physical Exam  Constitutional: She is oriented to person, place, and time. She appears well-developed and well-nourished. No distress.  Body mass index is 32.41 kg/m.   HENT:  Head: Normocephalic and atraumatic.  Right Ear: External ear normal.  Left Ear: External ear normal.  Mouth/Throat: Oropharynx is clear and moist. No oropharyngeal exudate.  Eyes: Conjunctivae and EOM are normal. Pupils are equal, round, and reactive to light. Right eye exhibits no discharge. Left eye exhibits no discharge. No scleral icterus.  Neck: Normal range of motion. Neck supple. No JVD present. No tracheal deviation present. No thyromegaly present.  Cardiovascular: Normal rate, regular rhythm, normal heart sounds and intact distal pulses.  Exam reveals no gallop and no friction rub.   No murmur heard. Pulmonary/Chest: Effort normal. No respiratory distress. She has no wheezes. She has rales. She exhibits no tenderness.  Abdominal: Soft. Bowel sounds are normal. She exhibits no distension and no mass. There is no tenderness. There is no rebound and no guarding.  Musculoskeletal: Normal range of motion. She exhibits edema. She exhibits no tenderness.  + edema  Lymphadenopathy:    She has no cervical adenopathy.  Neurological: She is alert and oriented to person, place, and time. She has normal reflexes. No cranial nerve deficit. She exhibits normal muscle tone. Coordination normal.  Skin: Skin is warm and dry. No rash noted. She is not diaphoretic. No erythema. No pallor.  Psychiatric: She has a normal mood and affect. Her behavior is normal. Judgment and thought content normal.  Vitals reviewed.    Vitals:   07/07/16 1146  BP: 138/64  Pulse: 60  SpO2: 95%  Weight: 210 lb (95.3 kg)  Height: 5' 7.5" (1.715 m)        Assessment:       ICD-9-CM ICD-10-CM   1. IPF (idiopathic pulmonary fibrosis) (H[facility]) 516.31 J84.112   2.  Encounter for therapeutic drug monitoring V58.83 Z51.81   3. Pulmonary emphysema, unspecified emphysema type ([place]) 492.8 J43.9   4. Alpha-1-antitrypsin deficiency ([place]) 273.4 E88.01        Plan:       Stable disease Fatigue and mild diarrhea could be due to esbriet  Plan  - continue esbriet and spiriva - roll esbriet 3pills into 1 pill tid  - check lft today 07/07/2016   - will refer  AGAIN via email to Mr Marlane Mingle of PFF foundation support group  - will refer to IPF registry study 07/07/2016 -study details explained  Followup  -= 1 month LFT check &  - 1 month spirometry + DLCO -> will call with results -followup visit 3 months   - consider PRolastin at fu depending on spirometry   Dr. Brand Males, M.D., [facility].C.P Pulmonary and Critical Care Medicine Staff Physician [facility] Pager: [phone], If no answer or between  15:00h - 7:00h: call [phone]  07/07/2016 12:30 PM

## 2016-07-07 NOTE — Addendum Note (Signed)
Addended by: Collier Salina on: 07/07/2016 12:33 PM   Modules accepted: Orders

## 2016-07-07 NOTE — Patient Instructions (Addendum)
ICD-9-CM ICD-10-CM   1. IPF (idiopathic pulmonary fibrosis) (HCC) 516.31 J84.112   2. Encounter for therapeutic drug monitoring V58.83 Z51.81   3. Pulmonary emphysema, unspecified emphysema type ([place]) 492.8 J43.9   4. Alpha-1-antitrypsin deficiency ([place]) 273.4 E88.01      Stable disease Fatigue and mild diarrhea could be due to [place]  - continue esbriet and spiriva  - check lft today 07/07/2016   - will refer  AGAIN via email to Mr Marlane Mingle of PFF foundation support group  - will refer to IPF registry study 07/07/2016 -study details explained  Followup  -= 1 month LFT check &  - 1 month spirometry + DLCO -> will call with results -followup visit 3 months

## 2016-07-12 NOTE — Progress Notes (Signed)
Called and spoke to pt. Informed her of the results per MR. Pt verbalized understanding and denied any further questions or concerns at this time.  

## 2016-07-17 ENCOUNTER — Emergency Department (HOSPITAL_COMMUNITY): Payer: Medicare Other

## 2016-07-17 ENCOUNTER — Emergency Department (HOSPITAL_COMMUNITY)
Admission: EM | Admit: 2016-07-17 | Discharge: 2016-07-18 | Disposition: A | Discharge status: Home / Self Care | Payer: Medicare Other | Attending: Emergency Medicine | Admitting: Emergency Medicine

## 2016-07-17 ENCOUNTER — Encounter (HOSPITAL_COMMUNITY): Payer: Self-pay | Admitting: Emergency Medicine

## 2016-07-17 DIAGNOSIS — I129 Hypertensive chronic kidney disease with stage 1 through stage 4 chronic kidney disease, or unspecified chronic kidney disease: Secondary | ICD-10-CM | POA: Insufficient documentation

## 2016-07-17 DIAGNOSIS — Z7984 Long term (current) use of oral hypoglycemic drugs: Secondary | ICD-10-CM | POA: Diagnosis not present

## 2016-07-17 DIAGNOSIS — T7840XA Allergy, unspecified, initial encounter: Secondary | ICD-10-CM | POA: Diagnosis present

## 2016-07-17 DIAGNOSIS — J449 Chronic obstructive pulmonary disease, unspecified: Secondary | ICD-10-CM | POA: Diagnosis not present

## 2016-07-17 DIAGNOSIS — Z79899 Other long term (current) drug therapy: Secondary | ICD-10-CM | POA: Insufficient documentation

## 2016-07-17 DIAGNOSIS — Z87891 Personal history of nicotine dependence: Secondary | ICD-10-CM | POA: Insufficient documentation

## 2016-07-17 DIAGNOSIS — E1122 Type 2 diabetes mellitus with diabetic chronic kidney disease: Secondary | ICD-10-CM | POA: Diagnosis not present

## 2016-07-17 DIAGNOSIS — N189 Chronic kidney disease, unspecified: Secondary | ICD-10-CM | POA: Diagnosis not present

## 2016-07-17 MED ORDER — PREDNISONE 20 MG PO TABS: 60.0000 mg | ORAL_TABLET | Freq: Once | ORAL | Status: DC

## 2016-07-17 MED ORDER — PREDNISONE 20 MG PO TABS
40.0000 mg | ORAL_TABLET | Freq: Once | ORAL | Status: AC
  Administered 2016-07-17: 40 mg via ORAL
  Filled 2016-07-17: qty 2

## 2016-07-17 MED ORDER — FAMOTIDINE 20 MG PO TABS
20.0000 mg | ORAL_TABLET | Freq: Once | ORAL | Status: AC
  Administered 2016-07-17: 20 mg via ORAL
  Filled 2016-07-17: qty 1

## 2016-07-17 MED ORDER — FAMOTIDINE 20 MG PO TABS: 20.0000 mg | ORAL_TABLET | Freq: Once | ORAL | Status: DC

## 2016-07-17 MED ORDER — DIPHENHYDRAMINE HCL 25 MG PO CAPS: 25.0000 mg | ORAL_CAPSULE | Freq: Once | ORAL | Status: DC

## 2016-07-17 NOTE — ED Notes (Signed)
PA at bedside.

## 2016-07-17 NOTE — ED Notes (Signed)
Patient transported to X-ray 

## 2016-07-17 NOTE — ED Provider Notes (Signed)
[facility] DEPT Provider Note   CSN: [id number] Arrival date & time: 07/17/16  2236     History   Chief Complaint Chief Complaint  Patient presents with  . Allergic Reaction    HPI Angela [name] is a 70 y.o. female.  70 year old Caucasian female past medical history significant for idiopathic pulmonary fibrosis, COPD, CKD, HTN that presents to the ED this evening with allergic reaction prior to arrival.. Patient states that she started taking a new medication for her idiopathic pulmonary fibrosis. The medication is esbriet. She has been taking the medication for approximately 6 weeks. Her reaction started after taking her third dose today around 6 PM. She endorses pruritic rash on her back and arms and face. She took 2 Benadryl approximately one hour ago with no relief. Patient denies any facial swelling, tongue edema, difficulty swallowing, difficulty breathing, shortness of breath. States she has been having nausea with taking this medication. Was told that she could reaction to the medication. Patient also states that she was eating soup last night and felt like it went down into her lungs. She has had a progressive cough today that has gotten worse. Denies any sputum production. Patient states more than baseline. Denies fever, chills, headache, vision changes, chest pain, abd pain, nausea, vomiting.       Past Medical History:  Diagnosis Date  . Alpha 1-antitrypsin PiMS phenotype   . Anemia 2014   mild  . Chronic kidney disease    early stages of some insufficiency  . Complication of anesthesia    passes out 24 hrs after getting  . COPD (chronic obstructive pulmonary disease) ([place])   . DDD (degenerative disc disease)   . Depression    with anxiety  . Diabetes mellitus   . Diverticulitis   . Diverticulosis   . Esophageal dysmotility 03/01/06   mild  . Fibromyalgia   . Hypertension   . Shingles   . Shortness of breath dyspnea   . Sleep apnea    wears CPAP  . Stroke  [facility]) 2010   3 tia's    Patient Active Problem List   Diagnosis Date Noted  . Encounter for therapeutic drug monitoring 07/07/2016  . IPF (idiopathic pulmonary fibrosis) ([place]) 04/14/2016  . COPD exacerbation ([place]) 09/09/2015  . Alpha-1-antitrypsin deficiency ([place]) 09/09/2015  . Pulmonary emphysema ([place]) 09/09/2015  . ILD (interstitial lung disease) ([place]) 09/09/2015  . Smoking history 08/11/2015  . Family history of alpha 1 antitrypsin deficiency 08/11/2015  . Dyspnea and respiratory abnormality 08/11/2015  . HNP (herniated nucleus pulposus) 03/16/2015  . Obesity 02/05/2014  . Internal hemorrhoid 08/23/2013  . RUQ abdominal pain 08/23/2013  . Nonspecific abnormal finding in stool contents 08/23/2013  . Abdominal pain, epigastric 06/03/2013  . Acute diverticulitis 05/29/2013  . Abdominal pain, left lower quadrant 05/29/2013  . Nausea and vomiting 05/29/2013  . Leukocytosis 05/29/2013  . Acute renal failure ([place]) 05/29/2013  . Anemia 05/29/2013  . Diabetes ([place]) 05/29/2013  . Dyslipidemia 05/29/2013  . HTN (hypertension) 04/14/2013    Past Surgical History:  Procedure Laterality Date  . ABDOMINAL HYSTERECTOMY    . ANTERIOR FUSION CERVICAL SPINE    . APPENDECTOMY    . BACK SURGERY    . DE QUERVAIN'S RELEASE     right  . GASTRIC FUNDOPLICATION    . HERNIA REPAIR    . KNEE ARTHROSCOPY     left   . LUNG BIOPSY Left 03/23/2016   Procedure: LUNG BIOPSY;  Surgeon: Melrose Nakayama,  MD;  Location: [facility];  Service: Thoracic;  Laterality: Left;  . LYMPH NODE BIOPSY Left 03/23/2016   Procedure: LYMPH NODE BIOPSY;  Surgeon: Melrose Nakayama, MD;  Location: [facility];  Service: Thoracic;  Laterality: Left;  . POSTERIOR CERVICAL FUSION/FORAMINOTOMY N/A 03/16/2015   Procedure: POSTERIOR CERVICAL LAMINECTOMY AND DISKECTOMOY AT CERVICAL SEVEN-THORACIC ONE ;POSTERIOR CERVICAL FUSION CERVICAL SIX TO THORACIC ONE;  Surgeon: Kary Kos, MD;  Location: [facility];  Service: Neurosurgery;   Laterality: N/A;  Site is Cervical/Thoracic  . TONSILLECTOMY    . VIDEO ASSISTED THORACOSCOPY Left 03/23/2016   Procedure: VIDEO ASSISTED THORACOSCOPY;  Surgeon: Melrose Nakayama, MD;  Location: [facility];  Service: Thoracic;  Laterality: Left;    OB History    No data available       Home Medications    Prior to Admission medications   Medication Sig Start Date End Date Taking? Authorizing Provider  acetaminophen (TYLENOL) 500 MG tablet Take 2 tablets (1,000 mg total) by mouth every 6 (six) hours as needed for mild pain or fever. 03/26/16   Erin R Barrett, PA-C  albuterol (PROVENTIL) (2.5 MG/3ML) 0.083% nebulizer solution INAHLE CONTENTS OF ONE VIAL VIA NEBULIZER EVERY 6 HOURS AS NEEDED FOR WHEEZING OR SHORTNESS OF BREATH 09/21/15   Brand Males, MD  B Complex-C (SUPER B-C PO) Take 1 tablet by mouth daily at 12 noon.     Historical Provider, MD  buPROPion (WELLBUTRIN XL) 300 MG 24 hr tablet Take 1 tablet by mouth every morning. 05/04/16   Historical Provider, MD  cyclobenzaprine (FLEXERIL) 10 MG tablet Take 10 mg by mouth at bedtime as needed for muscle spasms.  06/09/15   Historical Provider, MD  fluticasone (FLONASE) 50 MCG/ACT nasal spray Place 2 sprays into both nostrils daily as needed for allergies.  11/20/14   Historical Provider, MD  hydrochlorothiazide (MICROZIDE) 12.5 MG capsule Take 1 capsule (12.5 mg total) by mouth daily. 05/13/16   Brittainy Erie Noe, PA-C  losartan (COZAAR) 25 MG tablet TAKE 1 TABLET(25 MG) BY MOUTH DAILY 09/09/15   Brand Males, MD  metFORMIN (GLUCOPHAGE) 500 MG tablet Take 500 mg by mouth 2 (two) times daily with a meal.    Historical Provider, MD  metoprolol succinate (TOPROL-XL) 25 MG 24 hr tablet TAKE 1 TABLET(25 MG) BY MOUTH DAILY 05/13/16   Brittainy M Simmons, PA-C  ondansetron (ZOFRAN ODT) 4 MG disintegrating tablet Take 1 tablet (4 mg total) by mouth every 8 (eight) hours as needed for nausea or vomiting. 07/25/15   Sharlett Iles, MD    Pirfenidone (ESBRIET) 267 MG CAPS Take 3 capsules by mouth 3 (three) times daily. 05/31/16   Brand Males, MD  promethazine (PHENERGAN) 25 MG tablet Take 25 mg by mouth every 6 (six) hours as needed for nausea or vomiting. Reported on 04/12/2016    Historical Provider, MD  sertraline (ZOLOFT) 50 MG tablet Take 50 mg by mouth daily.    Historical Provider, MD  tiotropium (SPIRIVA) 18 MCG inhalation capsule Place 1 capsule (18 mcg total) into inhaler and inhale daily. 09/09/15   Brand Males, MD  traMADol (ULTRAM) 50 MG tablet Take 1-2 tablets (50-100 mg total) by mouth every 6 (six) hours as needed (mild pain). 03/26/16   Erin R Barrett, PA-C  vitamin B-12 (CYANOCOBALAMIN) 500 MCG tablet Take 500 mcg by mouth daily.    Historical Provider, MD    Family History Family History  Problem Relation Age of Onset  . COPD Father   .  Stomach cancer Maternal Grandfather   . Colon cancer Maternal Aunt     Social History Social History  Substance Use Topics  . Smoking status: Former Smoker    Packs/day: 1.00    Years: 22.00    Types: Cigarettes    Quit date: 04/14/2012  . Smokeless tobacco: Never Used     Comment: Quit 1 year ago in June 2013.    Marland Kitchen Alcohol use No     Allergies   Codeine; Iohexol; Tizanidine; and Ciprofloxacin   Review of Systems Review of Systems  Constitutional: Negative for chills and fever.  HENT: Positive for facial swelling. Negative for congestion, ear pain, rhinorrhea and sore throat.   Eyes: Negative for pain and visual disturbance.  Respiratory: Positive for cough. Negative for shortness of breath.   Cardiovascular: Negative for chest pain, palpitations and leg swelling.  Gastrointestinal: Negative for abdominal pain, diarrhea, nausea and vomiting.  Genitourinary: Negative for dysuria, flank pain, hematuria and urgency.  Musculoskeletal: Negative for arthralgias and back pain.  Skin: Negative for color change and rash.  Neurological: Negative for  dizziness, syncope, weakness, light-headedness and headaches.  All other systems reviewed and are negative.    Physical Exam Updated Vital Signs BP 156/71 (BP Location: Left Arm)   Pulse 63   Temp 98 F (36.7 C) (Oral)   Resp 18   SpO2 97%   Physical Exam  Constitutional: She appears well-developed and well-nourished. No distress.  Patients speaking in complete sentences able to maintain airway and secretions.  HENT:  Head: Normocephalic and atraumatic.  Mouth/Throat: Oropharynx is clear and moist.  No edema of oropharynx. No tongue or lip swelling appreciated.   Eyes: Conjunctivae are normal. Pupils are equal, round, and reactive to light. Right eye exhibits no discharge. Left eye exhibits no discharge. No scleral icterus.  Neck: Normal range of motion. Neck supple. No thyromegaly present.  Cardiovascular: Normal rate, regular rhythm, normal heart sounds and intact distal pulses.   Pulmonary/Chest: Effort normal and breath sounds normal. No respiratory distress. She has no wheezes.  Abdominal: Soft. Bowel sounds are normal. She exhibits no distension. There is no tenderness. There is no rebound and no guarding.  Musculoskeletal: Normal range of motion.  Lymphadenopathy:    She has no cervical adenopathy.  Neurological: She is alert.  Skin: Skin is warm and dry. Capillary refill takes less than 2 seconds.  Diffuse macular papular erythematous raised, diffuse rash to lower back, upper arms and above eyebrows bilaterally with signs of exoriation  Vitals reviewed.    ED Treatments / Results  Labs (all labs ordered are listed, but only abnormal results are displayed) Labs Reviewed - No data to display  EKG  EKG Interpretation None       Radiology Dg Chest 2 View  Result Date: 07/18/2016 CLINICAL DATA:  70 year old female with cough. History of interstitial pulmonary fibrosis EXAM: CHEST  2 VIEW COMPARISON:  Chest radiograph dated 04/12/2016 FINDINGS: Diffuse interstitial  and pleural parenchymal prominence as well as to cystic emphysematous changes consistent with underlying interstitial lung disease. There is no focal consolidation. No pleural effusion or pneumothorax. Stable cardiomediastinal silhouette. There is osteopenia with degenerative changes of the spine. Cervical spine posterior fusion hardware and fixation plate. No acute fracture. IMPRESSION: Findings consistent with known underlying interstitial lung disease. No focal consolidation. Electronically Signed   By: Anner Crete M.D.   On: 07/18/2016 00:13    Procedures Procedures (including critical care time)  Medications Ordered in ED Medications  famotidine (PEPCID) tablet 20 mg (not administered)  predniSONE (DELTASONE) tablet 60 mg (not administered)  diphenhydrAMINE (BENADRYL) capsule 25 mg (not administered)     Initial Impression / Assessment and Plan / ED Course  I have reviewed the triage vital signs and the nursing notes.  Pertinent labs & imaging results that were available during my care of the patient were reviewed by me and considered in my medical decision making (see chart for details).  Clinical Course  Patient presents to the ED with allergic reaction to IPF medication PTA. She developed urticaria around 1800 this evening. Patient took benadryl PTA. Patient given pepcid and steroids in ED with significant improvement. No evidence of anaphylaxis or angioedema. Able to maintain airway and manage secretions. Patient was monitored until 6 hours after onset of symptoms with significant improvement during stay.Patient also complained of increased cough since yesterday after choking on soup. Xray showed no signs of infection. Patient is afebrile and not tachycardic. Patient to follow up with pcp if symptoms persists. Encouraged patient to avoid taking the medication that caused reaction and to follow up with her pulmonologist tomorrow to decide what she needs to do about continuing  medication. Patient with strict return precautions given. Encouraged to follow up with PCP. Dr. Leonette Monarch saw and examined patient and agrees with plan. Hemodynamically stable and discharged home in NAD with stable vs.  Final Clinical Impressions(s) / ED Diagnoses   Final diagnoses:  Allergic reaction, initial encounter    New Prescriptions New Prescriptions   No medications on file     Doristine Devoid, PA-C 07/19/16 1646

## 2016-07-17 NOTE — ED Triage Notes (Signed)
Pt presents with allergic reaction, believes to be from esbriet (for idiopathic pulmonary fibrosis). Pt has red patches on back. Pt took off brand benadryl about an hour ago with no relief.

## 2016-07-18 ENCOUNTER — Telehealth: Payer: Self-pay | Admitting: Internal Medicine

## 2016-07-18 NOTE — Discharge Instructions (Signed)
Follow up with your pulmonologist tomorrow to determine if you need to continue medication. If symptom return or worsen return to the  ED. Follow up with your PCP as needed.

## 2016-07-18 NOTE — Telephone Encounter (Signed)
After the third dose of esbriet for the day (on it x 6 weeks) broke out in itchy rash > to ER and better today on benadryl rec stop esbriet and I will let Dr Chase Caller know of possible adverse effect and f/u accordingly for alternvative rx

## 2016-07-18 NOTE — ED Provider Notes (Signed)
Medical screening examination/treatment/procedure(s) were conducted as a shared visit with non-physician practitioner(s) and myself.  I personally evaluated the patient during the encounter. Briefly, the patient is a 70 y.o. female with idiopathic pulmonary fibrosis, COPD, CKD, HTN that presents to the ED this evening with allergic reaction prior to arrival.. Patient states that she started taking a new medication (Esbriet) for her idiopathic pulmonary fibrosis. She reports that initially she was having small papules that were pruritic; however today she developed urticaria. Onset was 1900. No respiratory symptoms. No nausea, vomiting, abdominal pain, headache, dizziness. Patient given 50 mg of Benadryl by EMS with no significant improvement. No evidence of anaphylaxis at this time. Patient given Pepcid and steroids here. Significant improvement during her stay. Monitored for 6 hours without recurrence. Feel patient is safe for discharge with strict return precautions and close PCP follow-up.    Fatima Blank, MD 07/18/16 [phone]

## 2016-07-18 NOTE — ED Notes (Signed)
Pt ambulatory and independent at discharge.  Verbalized understanding of discharge instructions 

## 2016-07-20 ENCOUNTER — Telehealth: Payer: Self-pay | Admitting: Internal Medicine

## 2016-07-20 NOTE — Telephone Encounter (Signed)
Per 9.4.17 phone note where pt spoke with MW: Tanda Rockers, MD  07/18/16 10:53 AM  Note    After the third dose of esbriet for the day (on it x 6 weeks) broke out in itchy rash > to ER and better today on benadryl rec stop esbriet and I will let Dr Chase Caller know of possible adverse effect and f/u accordingly for alternvative rx     Pt stated that she followed MW's recommendations and her symptoms returned - she went to see her PCP and it was deduced that that the rash was from a new craft glue that she had started using.  Patient would like to restart her Esbriet but needs to know if she needs to start at her former dose, or ramp up again like a new start.  MR please advise, thank you.

## 2016-07-21 NOTE — Telephone Encounter (Signed)
lmomtcb x1 for pt 

## 2016-07-21 NOTE — Telephone Encounter (Signed)
Spoke with pt. She is aware of MR's response. States that she has been in touch with Mr. Hildred Alamin and she will contact Anderson Malta back as soon as she can.

## 2016-07-21 NOTE — Telephone Encounter (Signed)
In this case I would recommend start as new start. Also, please a) tell her to get in touch with Mr Hildred Alamin from [facility] - he has emailed her and get back to [employer] about research.   Thanks  Dr. Brand Males, M.D., [facility].C.P Pulmonary and Critical Care Medicine Staff Physician [facility] Pager: [phone], If no answer or between  15:00h - 7:00h: call [phone]  07/21/2016 5:09 AM

## 2016-07-25 ENCOUNTER — Telehealth: Payer: Self-pay | Admitting: Internal Medicine

## 2016-07-25 NOTE — Telephone Encounter (Signed)
On 07/07/2016 patient saw Dr. Chase Caller and he asked that study coordinator speak with the patient about the IPF-PRO registry study. Upon discussing the study with the patient, she stated that she had previously discussed the study with Claudia Desanctis, and the patient thought she signed a consent form at that time. There was no documentation of this in study binders. The patient stated that she had the paperwork at home and would look when she arrived home and follow-up with me. I had not heard back from the patient so I called her today to check on the status of this. The patient stated that she was not able to find a signed study consent form, and felt that it was something else she signed previously, and not a consent form. I confirmed that there is no documentation that she was consented and no study procedures were performed on her. Patient stated that she was not interested in participating in the registry study at this time.   Crosby Bing, [place], Research Assistant

## 2016-08-12 ENCOUNTER — Telehealth: Payer: Self-pay | Admitting: Internal Medicine

## 2016-08-12 MED ORDER — AZITHROMYCIN 250 MG PO TABS: ORAL_TABLET | ORAL | 0 refills | Status: DC

## 2016-08-12 NOTE — Telephone Encounter (Signed)
Pt c/o cough with yellow mucus production and fatigue x 1 day.  Unaware if any fever present. Denies chills/sweats.  Requesting something [facility] called [facility]. 07/07/16 Stable disease Fatigue and mild diarrhea could be due to [place]  - continue esbriet and spiriva  - check lft today 07/07/2016   - will refer  AGAIN via email to Mr Marlane Mingle of [employer] support group  - will refer to IPF registry study 07/07/2016 -study details explained  Followup  -= 1 month LFT check &  - 1 month spirometry + DLCO -> will call with results -followup visit 3 months  Please advise Dr Melvyn Novas as Dr Chase Caller is not available today. Thanks.

## 2016-08-12 NOTE — Telephone Encounter (Signed)
Pt aware of recs. Rx sent in. Nothing further needed

## 2016-08-12 NOTE — Telephone Encounter (Signed)
z-pak 

## 2016-08-16 ENCOUNTER — Encounter (INDEPENDENT_AMBULATORY_CARE_PROVIDER_SITE_OTHER): Payer: Medicare Other | Admitting: Internal Medicine

## 2016-08-16 ENCOUNTER — Telehealth: Payer: Self-pay | Admitting: Internal Medicine

## 2016-08-16 DIAGNOSIS — J84112 Idiopathic pulmonary fibrosis: Secondary | ICD-10-CM | POA: Diagnosis not present

## 2016-08-16 LAB — PULMONARY FUNCTION TEST
DL/VA % pred: 61 %
DL/VA: 3.22 ml/min/mmHg/L
DLCO COR % PRED: 47 %
DLCO UNC: 14.1 ml/min/mmHg
DLCO cor: 13.9 ml/min/mmHg
DLCO unc % pred: 48 %
FEF 25-75 Pre: 1.63 L/sec
FEF2575-%Pred-Pre: 74 %
FEV1-%Pred-Pre: 79 %
FEV1-PRE: 2.11 L
FEV1FVC-%Pred-Pre: 103 %
FEV6-%Pred-Pre: 79 %
FEV6-Pre: 2.67 L
FEV6FVC-%Pred-Pre: 103 %
FVC-%PRED-PRE: 76 %
FVC-PRE: 2.69 L
PRE FEV1/FVC RATIO: 79 %
Pre FEV6/FVC Ratio: 99 %

## 2016-08-16 NOTE — Telephone Encounter (Signed)
lmtcb x1 for pt. 

## 2016-08-16 NOTE — Telephone Encounter (Signed)
Pt returing call again.Angela [name]

## 2016-08-16 NOTE — Telephone Encounter (Signed)
  Can try Take doxycycline 100mg  po twice daily x 5 days; take after meals and avoid sunlight  And  Please take prednisone 40 mg x1 day, then 30 mg x1 day, then 20 mg x1 day, then 10 mg x1 day, and then 5 mg x1 day and stop     Allergies  Allergen Reactions  . Codeine Nausea And Vomiting  . Iohexol      Desc: Pt states several years ago during a CT scan w/ iv cotnrast she had severe nausea and vomiting, sob w/ difficulty breathing and swallowing.  She had full 13hr. premeds today (5/14/7) and did fine w/o complications.   . Tizanidine Other (See Comments)    Severe decrease in BP   . Ciprofloxacin Rash

## 2016-08-16 NOTE — Telephone Encounter (Signed)
Patient returning call -pr °

## 2016-08-16 NOTE — Telephone Encounter (Signed)
Spoke with pt and she c/o continued cough with yellow mucus, ShOB and chest "discomfort". Pt denies wheeze or f/n/v. Pt has 1 tab of Zpak left and has not improved at all. Pt would like to know what else she can take or what can be called in for her.  MR - Please advise. Thanks!

## 2016-08-16 NOTE — Telephone Encounter (Signed)
LMTCB

## 2016-08-16 NOTE — Telephone Encounter (Signed)
lmtcb X1 for patient Need to verify pharmacy to send in prescriptions.

## 2016-08-17 MED ORDER — DOXYCYCLINE HYCLATE 100 MG PO TABS: 100.0000 mg | ORAL_TABLET | Freq: Two times a day (BID) | ORAL | 0 refills | Status: DC

## 2016-08-17 MED ORDER — PREDNISONE 10 MG PO TABS: ORAL_TABLET | ORAL | 0 refills | Status: DC

## 2016-08-17 NOTE — Telephone Encounter (Signed)
Spoke with pt. She is aware of MR's recommendations. Rxs have been sent to pt's preferred pharmacy. Nothing further was needed.

## 2016-09-05 ENCOUNTER — Emergency Department (HOSPITAL_COMMUNITY)
Admission: EM | Admit: 2016-09-05 | Discharge: 2016-09-05 | Disposition: A | Discharge status: Home / Self Care | Payer: Medicare Other | Attending: Emergency Medicine | Admitting: Emergency Medicine

## 2016-09-05 ENCOUNTER — Emergency Department (HOSPITAL_COMMUNITY): Payer: Medicare Other

## 2016-09-05 ENCOUNTER — Encounter (HOSPITAL_COMMUNITY): Payer: Self-pay | Admitting: Emergency Medicine

## 2016-09-05 DIAGNOSIS — N189 Chronic kidney disease, unspecified: Secondary | ICD-10-CM | POA: Insufficient documentation

## 2016-09-05 DIAGNOSIS — W19XXXA Unspecified fall, initial encounter: Secondary | ICD-10-CM

## 2016-09-05 DIAGNOSIS — J449 Chronic obstructive pulmonary disease, unspecified: Secondary | ICD-10-CM | POA: Insufficient documentation

## 2016-09-05 DIAGNOSIS — I129 Hypertensive chronic kidney disease with stage 1 through stage 4 chronic kidney disease, or unspecified chronic kidney disease: Secondary | ICD-10-CM | POA: Diagnosis not present

## 2016-09-05 DIAGNOSIS — Y929 Unspecified place or not applicable: Secondary | ICD-10-CM | POA: Insufficient documentation

## 2016-09-05 DIAGNOSIS — Y9301 Activity, walking, marching and hiking: Secondary | ICD-10-CM | POA: Diagnosis not present

## 2016-09-05 DIAGNOSIS — Z87891 Personal history of nicotine dependence: Secondary | ICD-10-CM | POA: Insufficient documentation

## 2016-09-05 DIAGNOSIS — M25561 Pain in right knee: Secondary | ICD-10-CM | POA: Insufficient documentation

## 2016-09-05 DIAGNOSIS — W1830XA Fall on same level, unspecified, initial encounter: Secondary | ICD-10-CM | POA: Diagnosis not present

## 2016-09-05 DIAGNOSIS — Z23 Encounter for immunization: Secondary | ICD-10-CM | POA: Insufficient documentation

## 2016-09-05 DIAGNOSIS — Y999 Unspecified external cause status: Secondary | ICD-10-CM | POA: Insufficient documentation

## 2016-09-05 DIAGNOSIS — R55 Syncope and collapse: Secondary | ICD-10-CM | POA: Diagnosis not present

## 2016-09-05 DIAGNOSIS — Z8673 Personal history of transient ischemic attack (TIA), and cerebral infarction without residual deficits: Secondary | ICD-10-CM | POA: Diagnosis not present

## 2016-09-05 DIAGNOSIS — Z7984 Long term (current) use of oral hypoglycemic drugs: Secondary | ICD-10-CM | POA: Diagnosis not present

## 2016-09-05 LAB — I-STAT CHEM 8, ED
BUN: 17 mg/dL (ref 6–20)
CHLORIDE: 104 mmol/L (ref 101–111)
Calcium, Ion: 1.17 mmol/L (ref 1.15–1.40)
Creatinine, Ser: 1.2 mg/dL — ABNORMAL HIGH (ref 0.44–1.00)
GLUCOSE: 93 mg/dL (ref 65–99)
HCT: 35 % — ABNORMAL LOW (ref 36.0–46.0)
HEMOGLOBIN: 11.9 g/dL — AB (ref 12.0–15.0)
POTASSIUM: 4.1 mmol/L (ref 3.5–5.1)
SODIUM: 142 mmol/L (ref 135–145)
TCO2: 25 mmol/L (ref 0–100)

## 2016-09-05 MED ORDER — TETANUS-DIPHTH-ACELL PERTUSSIS 5-2.5-18.5 LF-MCG/0.5 IM SUSP
0.5000 mL | Freq: Once | INTRAMUSCULAR | Status: AC
  Administered 2016-09-05: 0.5 mL via INTRAMUSCULAR
  Filled 2016-09-05: qty 0.5

## 2016-09-05 NOTE — ED Notes (Signed)
Bed: WTR6 Expected date:  Expected time:  Means of arrival:  Comments: EVS

## 2016-09-05 NOTE — ED Provider Notes (Signed)
Medical screening examination/treatment/procedure(s) were conducted as a shared visit with non-physician practitioner(s) and myself.  I personally evaluated the patient during the encounter.  70 year old female history of diabetes, stroke, kidney disease, COPD and hypertension presents to the emergency department has of knee pain after fall other day. She has an abrasion over her right knee but no evidence of trauma. She has normal motor and sensation distally. Low suspicion for significant injury however may have a ligamentous sprain or tear. We'll recommend [place] therapy and PCP follow-up if not improving in a week. Bigger issue is whether or not she passed out. She says she falls often however she does remember this fall at all and she is actually doesn't remember getting to the place where she fell. I am worried she may have syncopized. No evidence of trauma to her head and no headache and she doesn't think she had something it unlikely be retrograde amnesia. We'll check an i-STAT chem 8 and EKG and ensure these are okay. If these are fine she can continue workup with her primary doctor.    EKG Interpretation  Date/Time:  Monday September 05 2016 16:41:39 EDT Ventricular Rate:  64 PR Interval:    QRS Duration: 90 QT Interval:  416 QTC Calculation: 430 R Axis:   -33 Text Interpretation:  Sinus rhythm Left axis deviation Low voltage, precordial leads Baseline wander in lead(s) III No significant change since last tracing Confirmed by [facility] MD, Corene Cornea [phone]) on 09/06/2016 12:40:22 AM         Merrily Pew, MD 09/06/16 [id number]

## 2016-09-05 NOTE — ED Triage Notes (Signed)
Patient states that she was in old [place] parking lot and fell on Friday.  Patient states that has little pain in right upper leg but able to move everything so thought she was fine. Patient states that today she was on her knees looking for something under her bed and started having sharp pain in right knee when weight is directly pressed on it.

## 2016-09-05 NOTE — ED Notes (Signed)
Patient transported to X-ray 

## 2016-09-05 NOTE — Discharge Instructions (Signed)
Read the information below.  You may return to the Emergency Department at any time for worsening condition or any new symptoms that concern you.  If you develop uncontrolled pain, weakness or numbness of the extremity, severe discoloration of the skin, or you are unable to walk or move your knee, return to the ER for a recheck.     If you develop chest pain, shortness of breath, lightheadedness, or you pass out or fall again, please call 911 or return to the Emergency Department as soon as possible.

## 2016-09-05 NOTE — ED Notes (Signed)
Discharge instructions and follow up care reviewed with patient. Patient verbalized understanding. 

## 2016-09-05 NOTE — ED Provider Notes (Signed)
[facility] DEPT Provider Note   CSN: [id number] Arrival date & time: 09/05/16  1503  By signing my name below, I, Evelene Croon, attest that this documentation has been prepared under the direction and in the presence of non-physician practitioner, Clayton Bibles, PA-C. Electronically Signed: Evelene Croon, Scribe. 09/05/2016. 3:45 PM.   History   Chief Complaint Chief Complaint  Patient presents with  . Knee Pain    The history is provided by the patient. No language interpreter was used.   HPI Comments:  Angela [name] is a 70 y.o. female who presents to the Emergency Department s/p fall 4 days ago complaining of moderate right knee pain following the incident; worse today. Pt states she was walking when she fell; notes she landed on her hands and knees on pavement. Pt is unsure of LOC.  She does not remember falling.  Had no symptoms prior to fall.  She denies CP, dizziness at the time of her fall. She also denies head injury.  Until today she had only mild soreness, has been able to walk independently without difficulty.  She states today she got down on her knees to look for something and felt a sharp pain in the knee. No alleviating factors noted. Tetanus status is unknown.     Past Medical History:  Diagnosis Date  . Alpha 1-antitrypsin PiMS phenotype   . Anemia 2014   mild  . Chronic kidney disease    early stages of some insufficiency  . Complication of anesthesia    passes out 24 hrs after getting  . COPD (chronic obstructive pulmonary disease) ([place])   . DDD (degenerative disc disease)   . Depression    with anxiety  . Diabetes mellitus   . Diverticulitis   . Diverticulosis   . Esophageal dysmotility 03/01/06   mild  . Fibromyalgia   . Hypertension   . Shingles   . Shortness of breath dyspnea   . Sleep apnea    wears CPAP  . Stroke [facility]) 2010   3 tia's    Patient Active Problem List   Diagnosis Date Noted  . Encounter for therapeutic drug monitoring  07/07/2016  . IPF (idiopathic pulmonary fibrosis) ([place]) 04/14/2016  . COPD exacerbation ([place]) 09/09/2015  . Alpha-1-antitrypsin deficiency ([place]) 09/09/2015  . Pulmonary emphysema ([place]) 09/09/2015  . ILD (interstitial lung disease) ([place]) 09/09/2015  . Smoking history 08/11/2015  . Family history of alpha 1 antitrypsin deficiency 08/11/2015  . Dyspnea and respiratory abnormality 08/11/2015  . HNP (herniated nucleus pulposus) 03/16/2015  . Obesity 02/05/2014  . Internal hemorrhoid 08/23/2013  . RUQ abdominal pain 08/23/2013  . Nonspecific abnormal finding in stool contents 08/23/2013  . Abdominal pain, epigastric 06/03/2013  . Acute diverticulitis 05/29/2013  . Abdominal pain, left lower quadrant 05/29/2013  . Nausea and vomiting 05/29/2013  . Leukocytosis 05/29/2013  . Acute renal failure ([place]) 05/29/2013  . Anemia 05/29/2013  . Diabetes ([place]) 05/29/2013  . Dyslipidemia 05/29/2013  . HTN (hypertension) 04/14/2013    Past Surgical History:  Procedure Laterality Date  . ABDOMINAL HYSTERECTOMY    . ANTERIOR FUSION CERVICAL SPINE    . APPENDECTOMY    . BACK SURGERY    . DE QUERVAIN'S RELEASE     right  . GASTRIC FUNDOPLICATION    . HERNIA REPAIR    . KNEE ARTHROSCOPY     left   . LUNG BIOPSY Left 03/23/2016   Procedure: LUNG BIOPSY;  Surgeon: Melrose Nakayama, MD;  Location: [facility];  Service: Thoracic;  Laterality: Left;  . LYMPH NODE BIOPSY Left 03/23/2016   Procedure: LYMPH NODE BIOPSY;  Surgeon: Melrose Nakayama, MD;  Location: [facility];  Service: Thoracic;  Laterality: Left;  . POSTERIOR CERVICAL FUSION/FORAMINOTOMY N/A 03/16/2015   Procedure: POSTERIOR CERVICAL LAMINECTOMY AND DISKECTOMOY AT CERVICAL SEVEN-THORACIC ONE ;POSTERIOR CERVICAL FUSION CERVICAL SIX TO THORACIC ONE;  Surgeon: Kary Kos, MD;  Location: [facility];  Service: Neurosurgery;  Laterality: N/A;  Site is Cervical/Thoracic  . TONSILLECTOMY    . VIDEO ASSISTED THORACOSCOPY Left 03/23/2016   Procedure:  VIDEO ASSISTED THORACOSCOPY;  Surgeon: Melrose Nakayama, MD;  Location: [facility];  Service: Thoracic;  Laterality: Left;    OB History    No data available       Home Medications    Prior to Admission medications   Medication Sig Start Date End Date Taking? Authorizing Provider  acetaminophen (TYLENOL) 500 MG tablet Take 2 tablets (1,000 mg total) by mouth every 6 (six) hours as needed for mild pain or fever. 03/26/16   Erin R Barrett, PA-C  albuterol (PROVENTIL) (2.5 MG/3ML) 0.083% nebulizer solution INAHLE CONTENTS OF ONE VIAL VIA NEBULIZER EVERY 6 HOURS AS NEEDED FOR WHEEZING OR SHORTNESS OF BREATH 09/21/15   Brand Males, MD  azithromycin (ZITHROMAX) 250 MG tablet Take as directed 08/12/16   Tanda Rockers, MD  B Complex-C (SUPER B-C PO) Take 1 tablet by mouth daily at 12 noon.     Historical Provider, MD  buPROPion (WELLBUTRIN XL) 300 MG 24 hr tablet Take 1 tablet by mouth every morning. 05/04/16   Historical Provider, MD  cyclobenzaprine (FLEXERIL) 10 MG tablet Take 10 mg by mouth at bedtime as needed for muscle spasms.  06/09/15   Historical Provider, MD  doxycycline (VIBRA-TABS) 100 MG tablet Take 1 tablet (100 mg total) by mouth 2 (two) times daily. 08/17/16   Brand Males, MD  fluticasone (FLONASE) 50 MCG/ACT nasal spray Place 2 sprays into both nostrils daily as needed for allergies.  11/20/14   Historical Provider, MD  hydrochlorothiazide (MICROZIDE) 12.5 MG capsule Take 1 capsule (12.5 mg total) by mouth daily. 05/13/16   Brittainy M Simmons, PA-C  losartan (COZAAR) 25 MG tablet TAKE 1 TABLET(25 MG) BY MOUTH DAILY 09/09/15   Brand Males, MD  Menthol, Topical Analgesic, (BIOFREEZE) 4 % GEL Apply 1 application topically 3 (three) times daily as needed (for back pain).    Historical Provider, MD  metFORMIN (GLUCOPHAGE) 500 MG tablet Take 500 mg by mouth 2 (two) times daily with a meal.    Historical Provider, MD  metoprolol succinate (TOPROL-XL) 25 MG 24 hr tablet TAKE 1  TABLET(25 MG) BY MOUTH DAILY 05/13/16   Brittainy M Simmons, PA-C  ondansetron (ZOFRAN ODT) 4 MG disintegrating tablet Take 1 tablet (4 mg total) by mouth every 8 (eight) hours as needed for nausea or vomiting. 07/25/15   Sharlett Iles, MD  Pirfenidone (ESBRIET) 267 MG CAPS Take 3 capsules by mouth 3 (three) times daily. 05/31/16   Brand Males, MD  predniSONE (DELTASONE) 10 MG tablet 40 mg x1 day, then 30 mg x1 day, then 20 mg x1 day, then 10 mg x1 day, and then 5 mg x1 day and stop 08/17/16   Brand Males, MD  promethazine (PHENERGAN) 25 MG tablet Take 25 mg by mouth every 6 (six) hours as needed for nausea or vomiting. Reported on 04/12/2016    Historical Provider, MD  sertraline (ZOLOFT) 50 MG tablet Take 50 mg by  mouth daily.    Historical Provider, MD  tiotropium (SPIRIVA) 18 MCG inhalation capsule Place 1 capsule (18 mcg total) into inhaler and inhale daily. 09/09/15   Brand Males, MD  traMADol (ULTRAM) 50 MG tablet Take 1-2 tablets (50-100 mg total) by mouth every 6 (six) hours as needed (mild pain). 03/26/16   Erin R Barrett, PA-C  vitamin B-12 (CYANOCOBALAMIN) 500 MCG tablet Take 500 mcg by mouth daily.    Historical Provider, MD    Family History Family History  Problem Relation Age of Onset  . COPD Father   . Stomach cancer Maternal Grandfather   . Colon cancer Maternal Aunt     Social History Social History  Substance Use Topics  . Smoking status: Former Smoker    Packs/day: 1.00    Years: 22.00    Types: Cigarettes    Quit date: 04/14/2012  . Smokeless tobacco: Never Used     Comment: Quit 1 year ago in June 2013.    Marland Kitchen Alcohol use No     Allergies   Codeine; Iohexol; Tizanidine; and Ciprofloxacin   Review of Systems Review of Systems  Cardiovascular: Negative for chest pain.  Musculoskeletal: Positive for arthralgias and myalgias.       Knee pain   Neurological: Negative for dizziness, syncope and headaches.  All other systems reviewed and are  negative.    Physical Exam Updated Vital Signs BP (!) 125/54 (BP Location: Right Arm)   Pulse 65   Temp 97.8 F (36.6 C) (Oral)   Resp 18   SpO2 96%   Physical Exam  Constitutional: She is oriented to person, place, and time. She appears well-developed and well-nourished. No distress.  HENT:  Head: Normocephalic and atraumatic.  Eyes: Conjunctivae are normal.  Neck: Neck supple.  Cardiovascular: Normal rate.   Pulmonary/Chest: Effort normal.  Musculoskeletal:  RLE tenderness over the proximal tibia anteriorly; small abrasion overlying this area; no other focal bony tenderness throughout RLE Distal sensation and pulses intact Compartments are soft; no edema or discoloration   Neurological: She is alert and oriented to person, place, and time.  Skin: Skin is warm and dry. She is not diaphoretic.  Psychiatric: She has a normal mood and affect.  Nursing note and vitals reviewed.    ED Treatments / Results  DIAGNOSTIC STUDIES:  Oxygen Saturation is 96% on RA, normal by my interpretation.    COORDINATION OF CARE:  3:43 PM Discussed treatment plan with pt at bedside and pt agreed to plan.  Labs (all labs ordered are listed, but only abnormal results are displayed) Labs Reviewed  I-STAT CHEM 8, ED - Abnormal; Notable for the following:       Result Value   Creatinine, Ser 1.20 (*)    Hemoglobin 11.9 (*)    HCT 35.0 (*)    All other components within normal limits    EKG  EKG Interpretation None       Radiology Dg Knee Complete 4 Views Right  Result Date: 09/05/2016 CLINICAL DATA:  Fall 3 days ago with persistent right knee pain, initial encounter EXAM: RIGHT KNEE - COMPLETE 4+ VIEW COMPARISON:  None. FINDINGS: No evidence of fracture, dislocation, or joint effusion. No evidence of arthropathy or other focal bone abnormality. Soft tissues are unremarkable. IMPRESSION: No acute abnormality noted. Electronically Signed   By: Inez Catalina M.D.   On: 09/05/2016 16:13     Procedures Procedures (including critical care time)  Medications Ordered in ED Medications  Tdap ([place])  injection 0.5 mL (0.5 mLs Intramuscular Given 09/05/16 1554)     Initial Impression / Assessment and Plan / ED Course  I have reviewed the triage vital signs and the nursing notes.  Pertinent labs & imaging results that were available during my care of the patient were reviewed by me and considered in my medical decision making (see chart for details).  Clinical Course  Comment By Time  Pt also seen and examined by Dr Dayna Barker.  Will add EKG and Chem 8 given possible syncopal episode causing the fall.   Clayton Bibles, PA-C 10/23 1631    Afebrile, nontoxic patient with fall two days ago onto right knee.  Has not had pain until today when she felt a sharp pain in her knee.  Xray negative.  Pt is unsure of how she fell.  She had neuropathy and falls frequently.  Fall vs syncopal episode.  Pt also seen by Dr Dayna Barker.  EKG and chem 8 unremarkable.  Renal insufficiency and anemia approximately baseline.   D/C home with ace wrap on right knee, RICE instructions, strong encouragement to follow up with PCP regarding both the knee and the suspected syncope.   Discussed result, findings, treatment, and follow up  with patient.  Pt given return precautions.  Pt verbalizes understanding and agrees with plan.       Final Clinical Impressions(s) / ED Diagnoses   Final diagnoses:  Syncope, unspecified syncope type  Fall, initial encounter  Acute pain of right knee    New Prescriptions Discharge Medication List as of 09/05/2016  4:51 PM     I personally performed the services described in this documentation, which was scribed in my presence. The recorded information has been reviewed and is accurate.     Clayton Bibles, PA-C 09/05/16 [place], PA-C 09/05/16 1804    Merrily Pew, MD 09/06/16 [phone]

## 2016-09-05 NOTE — ED Notes (Signed)
Bed: WTR5 Expected date:  Expected time:  Means of arrival:  Comments: 

## 2016-09-14 ENCOUNTER — Other Ambulatory Visit: Payer: Self-pay | Admitting: Internal Medicine

## 2016-09-17 ENCOUNTER — Other Ambulatory Visit: Payer: Self-pay | Admitting: Physician Assistant

## 2016-10-05 ENCOUNTER — Encounter: Payer: Self-pay | Admitting: *Deleted

## 2016-10-10 ENCOUNTER — Encounter: Payer: Self-pay | Admitting: Internal Medicine

## 2016-10-10 ENCOUNTER — Other Ambulatory Visit (INDEPENDENT_AMBULATORY_CARE_PROVIDER_SITE_OTHER): Payer: Medicare Other

## 2016-10-10 ENCOUNTER — Ambulatory Visit (INDEPENDENT_AMBULATORY_CARE_PROVIDER_SITE_OTHER): Payer: Medicare Other | Admitting: Internal Medicine

## 2016-10-10 VITALS — BP 114/70 | HR 97 | Ht 67.5 in | Wt 209.0 lb

## 2016-10-10 DIAGNOSIS — J8410 Pulmonary fibrosis, unspecified: Secondary | ICD-10-CM

## 2016-10-10 DIAGNOSIS — J439 Emphysema, unspecified: Secondary | ICD-10-CM | POA: Diagnosis not present

## 2016-10-10 DIAGNOSIS — J84112 Idiopathic pulmonary fibrosis: Secondary | ICD-10-CM

## 2016-10-10 DIAGNOSIS — E8801 Alpha-1-antitrypsin deficiency: Secondary | ICD-10-CM

## 2016-10-10 DIAGNOSIS — Z5181 Encounter for therapeutic drug level monitoring: Secondary | ICD-10-CM | POA: Diagnosis not present

## 2016-10-10 LAB — HEPATIC FUNCTION PANEL
ALBUMIN: 4.1 g/dL (ref 3.5–5.2)
ALT: 17 U/L (ref 0–35)
AST: 14 U/L (ref 0–37)
Alkaline Phosphatase: 108 U/L (ref 39–117)
Bilirubin, Direct: 0 mg/dL (ref 0.0–0.3)
TOTAL PROTEIN: 6.9 g/dL (ref 6.0–8.3)
Total Bilirubin: 0.2 mg/dL (ref 0.2–1.2)

## 2016-10-10 NOTE — Patient Instructions (Addendum)
ICD-9-CM ICD-10-CM   1. Pulmonary emphysema with fibrosis of lung (HCC) 492.8 J43.9    515 J84.10   2. IPF (idiopathic pulmonary fibrosis) (HCC) 516.31 J84.112   3. Pulmonary emphysema, unspecified emphysema type ([place]) 492.8 J43.9   4. Alpha-1-antitrypsin deficiency ([place]) 273.4 E88.01   5. Encounter for therapeutic drug monitoring V58.83 Z51.81    Progressive disease oct 2016 through oct 2017 Significant side effect with esbriet (fatigue, nausea, bloated sensation)  Plan  - start prolastin replacement for MZ phenotype with decline lung function and repeated flare up  - continue esbriet but reduce dose to to 2 pill three times daily - take with food and ginger - check LFT 10/10/2016; will call with results  Followup  6 weeks with me or NP - morning appt only to report progress and check status on prolastin

## 2016-10-10 NOTE — Progress Notes (Signed)
Subjective:     Patient ID: Angela [name], female   DOB: 1946-08-14, 70 y.o.   MRN: [id number]  HPI   OV 10/10/2016  Chief Complaint  Patient presents with  . Follow-up    Pt states overall her breathing has slightly improved since last OV. Pt states she has prod cough with yellow. Pt c/o midsternal CP 20 min after 2nd or 3rd dose of Esbriet.     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 This was also personally visualized. The main issues that were 3+3 times daily of Pirfenidone (Esbriet) she's having significant GI side effects of fatigue, nausea and bloated sensation and even chest pain after her dose. She's tried ginger she's had between meals. She has pacingg doses for several hours. All and communication with the help desk - but no improvement. She rates his symptoms is severe. Nevertheless because of the severity of pulmonary fibrosis and she is willing to continue with the medications although at a lower dose. She's not interested in switching over to  Ofev at this point in time. In the past she's not being in her about participating in the Pulm fibrosis foundation support group or research trials. She's not lost weight. Not yet had transplant discussion in detail. She is asking about alpha one replacement therapy because of the decline in lung function and also recurrent exacerbations with the antibiotic courses since last visit.     for Angela [name], Angela [name] (MRN [id number]) as of 10/10/2016 14:56  Ref. Range 08/28/2015 15:36 03/04/2016 12:53 08/16/2016 10:21  FVC-Pre Latest Units: L 2.81 2.79 2.69  FVC-%Pred-Pre Latest Units: % 79 79 76   Results for Angela [name], Angela [name] (MRN [id number]) as of 10/10/2016 14:56  Ref. Range 08/28/2015 15:36 03/04/2016 12:53 08/16/2016 10:21  DLCO unc Latest Units: ml/min/mmHg 15.56 14.76 14.10   DLCO unc % pred Latest Units: % 53 50 48     has a past medical history of Alpha 1-antitrypsin PiMS phenotype; Anemia (2014); Chronic kidney disease; Complication of anesthesia; COPD (chronic obstructive pulmonary disease) ([place]); DDD (degenerative disc disease); Depression; Diabetes mellitus; Diverticulitis; Diverticulosis; Esophageal dysmotility (03/01/06); Fibromyalgia; Hypertension; Shingles; Shortness of breath dyspnea; Sleep apnea; and Stroke ([place]) (2010).   reports that she quit smoking about 4 years ago. Her smoking use included Cigarettes. She has a 22.00 pack-year smoking history. She has never used smokeless tobacco.  Past Surgical History:  Procedure Laterality Date  . ABDOMINAL HYSTERECTOMY    . ANTERIOR FUSION CERVICAL SPINE    . APPENDECTOMY    . BACK SURGERY    . DE QUERVAIN'S RELEASE     right  . GASTRIC FUNDOPLICATION    . HERNIA REPAIR    . KNEE ARTHROSCOPY     left   . LUNG BIOPSY Left 03/23/2016   Procedure: LUNG BIOPSY;  Surgeon: Melrose Nakayama, MD;  Location: [facility];  Service: Thoracic;  Laterality: Left;  . LYMPH NODE BIOPSY Left 03/23/2016   Procedure: LYMPH NODE BIOPSY;  Surgeon: Melrose Nakayama, MD;  Location: [facility];  Service: Thoracic;  Laterality: Left;  . POSTERIOR CERVICAL FUSION/FORAMINOTOMY N/A 03/16/2015   Procedure: POSTERIOR CERVICAL LAMINECTOMY AND DISKECTOMOY AT CERVICAL SEVEN-THORACIC ONE ;POSTERIOR CERVICAL FUSION CERVICAL SIX TO THORACIC ONE;  Surgeon: Kary Kos, MD;  Location: [facility];  Service: Neurosurgery;  Laterality: N/A;  Site is Cervical/Thoracic  .  TONSILLECTOMY    . VIDEO ASSISTED THORACOSCOPY Left 03/23/2016   Procedure: VIDEO ASSISTED THORACOSCOPY;  Surgeon: Melrose Nakayama, MD;  Location: [facility];  Service: Thoracic;  Laterality: Left;    Allergies  Allergen Reactions  . Codeine Nausea And Vomiting  . Iohexol      Desc: Pt states several years ago during a CT scan w/ iv cotnrast she had severe nausea and vomiting,  sob w/ difficulty breathing and swallowing.  She had full 13hr. premeds today (5/14/7) and did fine w/o complications.   . Tizanidine Other (See Comments)    Severe decrease in BP   . Ciprofloxacin Rash    Immunization History  Administered Date(s) Administered  . Pneumococcal-Unspecified 11/14/2012  . Tdap 09/05/2016    Family History  Problem Relation Age of Onset  . COPD Father   . Stomach cancer Maternal Grandfather   . Colon cancer Maternal Aunt      Current Outpatient Prescriptions:  .  acetaminophen (TYLENOL) 500 MG tablet, Take 2 tablets (1,000 mg total) by mouth every 6 (six) hours as needed for mild pain or fever., Disp: 30 tablet, Rfl: 0 .  albuterol (PROVENTIL) (2.5 MG/3ML) 0.083% nebulizer solution, INAHLE CONTENTS OF ONE VIAL VIA NEBULIZER EVERY 6 HOURS AS NEEDED FOR WHEEZING OR SHORTNESS OF BREATH, Disp: 900 mL, Rfl: 1 .  B Complex-C (SUPER B-C PO), Take 1 tablet by mouth daily at 12 noon. , Disp: , Rfl:  .  buPROPion (WELLBUTRIN XL) 300 MG 24 hr tablet, Take 1 tablet by mouth every morning., Disp: , Rfl:  .  cyclobenzaprine (FLEXERIL) 10 MG tablet, Take 10 mg by mouth at bedtime as needed for muscle spasms. , Disp: , Rfl: 0 .  fluticasone (FLONASE) 50 MCG/ACT nasal spray, Place 2 sprays into both nostrils daily as needed for allergies. , Disp: , Rfl: 4 .  hydrochlorothiazide (MICROZIDE) 12.5 MG capsule, Take 1 capsule (12.5 mg total) by mouth daily., Disp: 90 capsule, Rfl: 3 .  losartan (COZAAR) 25 MG tablet, TAKE 1 TABLET(25 MG) BY MOUTH DAILY, Disp: 90 tablet, Rfl: 1 .  Menthol, Topical Analgesic, (BIOFREEZE) 4 % GEL, Apply 1 application topically 3 (three) times daily as needed (for back pain)., Disp: , Rfl:  .  metFORMIN (GLUCOPHAGE) 500 MG tablet, Take 500 mg by mouth 2 (two) times daily with a meal., Disp: , Rfl:  .  metoprolol succinate (TOPROL-XL) 25 MG 24 hr tablet, TAKE 1 TABLET(25 MG) BY MOUTH DAILY, Disp: 90 tablet, Rfl: 3 .  Multiple Vitamin  (MULTIVITAMIN) capsule, Take 1 capsule by mouth daily., Disp: , Rfl:  .  ondansetron (ZOFRAN ODT) 4 MG disintegrating tablet, Take 1 tablet (4 mg total) by mouth every 8 (eight) hours as needed for nausea or vomiting., Disp: 10 tablet, Rfl: 0 .  Pirfenidone (ESBRIET) 267 MG CAPS, Take 3 capsules by mouth 3 (three) times daily., Disp: 270 capsule, Rfl: 11 .  promethazine (PHENERGAN) 25 MG tablet, Take 25 mg by mouth every 6 (six) hours as needed for nausea or vomiting. Reported on 04/12/2016, Disp: , Rfl:  .  sertraline (ZOLOFT) 50 MG tablet, Take 50 mg by mouth daily., Disp: , Rfl:  .  SPIRIVA HANDIHALER 18 MCG inhalation capsule, PLACE 1 CAPSULE INTO INHALER AND INHALE DAILY, Disp: 30 capsule, Rfl: 0 .  traMADol (ULTRAM) 50 MG tablet, Take 1-2 tablets (50-100 mg total) by mouth every 6 (six) hours as needed (mild pain)., Disp: 30 tablet, Rfl: 0 .  vitamin B-12 (CYANOCOBALAMIN)  500 MCG tablet, Take 500 mcg by mouth daily., Disp: , Rfl:    Review of Systems     Objective:   Physical Exam  Constitutional: She is oriented to person, place, and time. She appears well-developed and well-nourished. No distress.  Obese Body mass index is 32.25 kg/m.   HENT:  Head: Normocephalic and atraumatic.  Right Ear: External ear normal.  Left Ear: External ear normal.  Mouth/Throat: Oropharynx is clear and moist. No oropharyngeal exudate.  Eyes: Conjunctivae and EOM are normal. Pupils are equal, round, and reactive to light. Right eye exhibits no discharge. Left eye exhibits no discharge. No scleral icterus.  Neck: Normal range of motion. Neck supple. No JVD present. No tracheal deviation present. No thyromegaly present.  Cardiovascular: Normal rate, regular rhythm, normal heart sounds and intact distal pulses.  Exam reveals no gallop and no friction rub.   No murmur heard. Pulmonary/Chest: Effort normal. No respiratory distress. She has no wheezes. She has rales. She exhibits no tenderness.  Crackles at  base  Abdominal: Soft. Bowel sounds are normal. She exhibits no distension and no mass. There is no tenderness. There is no rebound and no guarding.  Musculoskeletal: Normal range of motion. She exhibits no edema or tenderness.  Lymphadenopathy:    She has no cervical adenopathy.  Neurological: She is alert and oriented to person, place, and time. She has normal reflexes. No cranial nerve deficit. She exhibits normal muscle tone. Coordination normal.  Skin: Skin is warm and dry. No rash noted. She is not diaphoretic. No erythema. No pallor.  Psychiatric: She has a normal mood and affect. Her behavior is normal. Judgment and thought content normal.  Vitals reviewed.  Vitals:   10/10/16 1433  BP: 114/70  Pulse: 97  SpO2: (!) 68%  Weight: 209 lb (94.8 kg)  Height: 5' 7.5" (1.715 m)       Assessment:       ICD-9-CM ICD-10-CM   1. Pulmonary emphysema with fibrosis of lung (HCC) 492.8 J43.9    515 J84.10   2. IPF (idiopathic pulmonary fibrosis) (HCC) 516.31 J84.112   3. Pulmonary emphysema, unspecified emphysema type ([place]) 492.8 J43.9   4. Alpha-1-antitrypsin deficiency ([place]) 273.4 E88.01   5. Encounter for therapeutic drug monitoring V58.83 Z51.81        Plan:      Progressive disease oct 2016 through oct 2017 Significant side effect with esbriet (fatigue, nausea, bloated sensation)  Plan  - start prolastin replacement for MZ phenotype with decline lung function and repeated flare up  - continue esbriet but reduce dose to to 2 pill three times daily - take with food and ginger - check LFT 10/10/2016; will call with results  Followup  6 weeks with me or NP - morning appt only to report progress and check status on prolastin  - discuss rehab, weight loss and transplant at followup  > 50% of this > 25 min visit spent in face to face counseling or coordination of care    Dr. Brand Males, M.D., [facility].C.P Pulmonary and Critical Care Medicine Staff Physician [facility] Pager: [phone], If no answer or between  15:00h - 7:00h: call [phone]  10/10/2016 2:59 PM

## 2016-10-14 ENCOUNTER — Ambulatory Visit (INDEPENDENT_AMBULATORY_CARE_PROVIDER_SITE_OTHER): Payer: Medicare Other | Admitting: Cardiology

## 2016-10-14 ENCOUNTER — Encounter: Payer: Self-pay | Admitting: Cardiology

## 2016-10-14 ENCOUNTER — Other Ambulatory Visit: Payer: Self-pay | Admitting: Internal Medicine

## 2016-10-14 VITALS — BP 116/78 | HR 66 | Ht 67.5 in | Wt 209.0 lb

## 2016-10-14 DIAGNOSIS — J84112 Idiopathic pulmonary fibrosis: Secondary | ICD-10-CM

## 2016-10-14 DIAGNOSIS — I10 Essential (primary) hypertension: Secondary | ICD-10-CM

## 2016-10-14 DIAGNOSIS — R001 Bradycardia, unspecified: Secondary | ICD-10-CM

## 2016-10-14 DIAGNOSIS — E6609 Other obesity due to excess calories: Secondary | ICD-10-CM | POA: Diagnosis not present

## 2016-10-14 NOTE — Progress Notes (Signed)
Called and spoke to pt. Informed her of the results per MR. Pt verbalized understanding and denied any further questions or concerns at this time.  

## 2016-10-14 NOTE — Progress Notes (Signed)
[facility]. [address], [place]  [address] Phone: [phone] Fax:  [phone]  Date:  10/14/2016   ID:  Angela [name], DOB September 21, 1946, MRN [id number]  PCP:  Jonathon Bellows, MD   History of Present Illness: Angela [name] is a 70 y.o. female with diabetes, hypertension, hyperlipidemia, anxiety, chronic kidney disease here for the follow-up of hypertension.  She was in the emergency department on 04/24/15 with severe hypotension, systolic blood pressure of 64. She called EMS. She was transported via ambulance. We stopped her hydrochlorothiazide and amlodipine. Overall doing better. No further dizziness or syncope.  2009 stress test, NUC. Low risk.  Carotid bruit. Right. Doppler 07/2013 - no significant disease bilaterally.   In the past, we had to decrease her metoprolol because of bradycardia. This is improved. She also after having weight loss, had lower blood pressure in the past.  In the distant past, her blood pressure was always [id number] systolic. This changed however as she aged.  She also had been experiencing photophobia and was told that she had migraine without pain. She also sometimes gets a rash on her left inner ankle.  Recent cervical fusion surgery in May 2016.  She has been battling her pulmonary fibrosis, seen Dr. Chase Caller. Trying new medications.   Wt Readings from Last 3 Encounters:  10/14/16 209 lb (94.8 kg)  10/10/16 209 lb (94.8 kg)  07/07/16 210 lb (95.3 kg)     Past Medical History:  Diagnosis Date  . Alpha 1-antitrypsin PiMS phenotype   . Anemia 2014   mild  . Chronic kidney disease    early stages of some insufficiency  . Complication of anesthesia    passes out 24 hrs after getting  . COPD (chronic obstructive pulmonary disease) ([place])   . DDD (degenerative disc disease)   . Depression    with anxiety  . Diabetes mellitus   . Diverticulitis   . Diverticulosis   . Esophageal dysmotility 03/01/06   mild  . Fibromyalgia   .  Hypertension   . Shingles   . Shortness of breath dyspnea   . Sleep apnea    wears CPAP  . Stroke [facility]) 2010   3 tia's    Past Surgical History:  Procedure Laterality Date  . ABDOMINAL HYSTERECTOMY    . ANTERIOR FUSION CERVICAL SPINE    . APPENDECTOMY    . BACK SURGERY    . DE QUERVAIN'S RELEASE     right  . GASTRIC FUNDOPLICATION    . HERNIA REPAIR    . KNEE ARTHROSCOPY     left   . LUNG BIOPSY Left 03/23/2016   Procedure: LUNG BIOPSY;  Surgeon: Melrose Nakayama, MD;  Location: [facility];  Service: Thoracic;  Laterality: Left;  . LYMPH NODE BIOPSY Left 03/23/2016   Procedure: LYMPH NODE BIOPSY;  Surgeon: Melrose Nakayama, MD;  Location: [facility];  Service: Thoracic;  Laterality: Left;  . POSTERIOR CERVICAL FUSION/FORAMINOTOMY N/A 03/16/2015   Procedure: POSTERIOR CERVICAL LAMINECTOMY AND DISKECTOMOY AT CERVICAL SEVEN-THORACIC ONE ;POSTERIOR CERVICAL FUSION CERVICAL SIX TO THORACIC ONE;  Surgeon: Kary Kos, MD;  Location: [facility];  Service: Neurosurgery;  Laterality: N/A;  Site is Cervical/Thoracic  . TONSILLECTOMY    . VIDEO ASSISTED THORACOSCOPY Left 03/23/2016   Procedure: VIDEO ASSISTED THORACOSCOPY;  Surgeon: Melrose Nakayama, MD;  Location: [place];  Service: Thoracic;  Laterality: Left;    Current Outpatient Prescriptions  Medication Sig Dispense Refill  .  acetaminophen (TYLENOL) 500 MG tablet Take 2 tablets (1,000 mg total) by mouth every 6 (six) hours as needed for mild pain or fever. 30 tablet 0  . albuterol (PROVENTIL) (2.5 MG/3ML) 0.083% nebulizer solution INAHLE CONTENTS OF ONE VIAL VIA NEBULIZER EVERY 6 HOURS AS NEEDED FOR WHEEZING OR SHORTNESS OF BREATH 900 mL 1  . B Complex-C (SUPER B-C PO) Take 1 tablet by mouth daily at 12 noon.     Marland Kitchen buPROPion (WELLBUTRIN XL) 300 MG 24 hr tablet Take 1 tablet by mouth every morning.    . cyclobenzaprine (FLEXERIL) 10 MG tablet Take 10 mg by mouth at bedtime as needed for muscle spasms.   0  . fluticasone (FLONASE) 50  MCG/ACT nasal spray Place 2 sprays into both nostrils daily as needed for allergies.   4  . hydrochlorothiazide (MICROZIDE) 12.5 MG capsule Take 1 capsule (12.5 mg total) by mouth daily. 90 capsule 3  . losartan (COZAAR) 25 MG tablet TAKE 1 TABLET(25 MG) BY MOUTH DAILY 90 tablet 1  . Menthol, Topical Analgesic, (BIOFREEZE) 4 % GEL Apply 1 application topically 3 (three) times daily as needed (for back pain).    . metFORMIN (GLUCOPHAGE) 500 MG tablet Take 500 mg by mouth 2 (two) times daily with a meal.    . metoprolol succinate (TOPROL-XL) 25 MG 24 hr tablet TAKE 1 TABLET(25 MG) BY MOUTH DAILY 90 tablet 3  . Multiple Vitamin (MULTIVITAMIN) capsule Take 1 capsule by mouth daily.    . ondansetron (ZOFRAN ODT) 4 MG disintegrating tablet Take 1 tablet (4 mg total) by mouth every 8 (eight) hours as needed for nausea or vomiting. 10 tablet 0  . Pirfenidone (ESBRIET) 267 MG CAPS Take 3 capsules by mouth 3 (three) times daily. 270 capsule 11  . promethazine (PHENERGAN) 25 MG tablet Take 25 mg by mouth every 6 (six) hours as needed for nausea or vomiting. Reported on 04/12/2016    . sertraline (ZOLOFT) 50 MG tablet Take 50 mg by mouth daily.    Marland Kitchen SPIRIVA HANDIHALER 18 MCG inhalation capsule PLACE 1 CAPSULE INTO INHALER AND INHALE DAILY 30 capsule 0  . traMADol (ULTRAM) 50 MG tablet Take 1-2 tablets (50-100 mg total) by mouth every 6 (six) hours as needed (mild pain). 30 tablet 0  . vitamin B-12 (CYANOCOBALAMIN) 500 MCG tablet Take 500 mcg by mouth daily.     No current facility-administered medications for this visit.     Allergies:    Allergies  Allergen Reactions  . Codeine Nausea And Vomiting  . Iohexol      Desc: Pt states several years ago during a CT scan w/ iv cotnrast she had severe nausea and vomiting, sob w/ difficulty breathing and swallowing.  She had full 13hr. premeds today (5/14/7) and did fine w/o complications.   . Tizanidine Other (See Comments)    Severe decrease in BP   .  Ciprofloxacin Rash    Social History:  The patient  reports that she quit smoking about 4 years ago. Her smoking use included Cigarettes. She has a 22.00 pack-year smoking history. She has never used smokeless tobacco. She reports that she does not drink alcohol or use drugs.  She use to work in [employer].  ROS:  Please see the history of present illness.   No fevers, no chills, no bleeding    PHYSICAL EXAM: VS:  BP 116/78   Pulse 66   Ht 5' 7.5" (1.715 m)   Wt 209 lb (94.8 kg)  LMP  (LMP Unknown)   BMI 32.25 kg/m  Well nourished, well developed, in no acute distress  HEENT: normal Cervical spinal fusion scar noted. Posterior neck. Neck: no JVD  Cardiac:  normal S1, S2; RRR; no murmur  Lungs:  Mild scattered crackles noted, pulmonary fibrosis  Abd: soft, nontender, no hepatomegaly  Ext: trace edema  Skin: warm and dry  Neuro: no focal abnormalities noted  EKG:  None today     ASSESSMENT AND PLAN:  1. Hypotension related to drugs and weight loss-she was seen in the emergency department with quite significantly low blood pressures. I spoke with Dr. Winfred Leeds - we stopped amlodipine and hydrochlorothiazide. Her blood pressure is much better currently.  I will hesitate on increasing her medication.  2. Pulmonary fibrosis, Alpha 1 antitriypsin. - Dr. Romilda Garret. 3. Hypertension- as above.  4.  Bradycardia-on 07/16/14 her metoprolol was decreased to 25 mg a day because of sinus bradycardia rate 52 previously, doing well.   5. Obesity-continue to encourage weight loss.  She joined [employer]. As weight decreases, her blood pressure medication may decrease as well. We may need to pull back on other medications. 6. 12 month f/u   Signed, Candee Furbish, MD [facility]  10/14/2016 8:40 AM

## 2016-10-14 NOTE — Patient Instructions (Signed)

## 2016-10-17 ENCOUNTER — Telehealth: Payer: Self-pay | Admitting: Internal Medicine

## 2016-10-17 NOTE — Telephone Encounter (Signed)
lmtcb X1 for Angela [name] at [facility]. lmtcb for pt.

## 2016-10-17 NOTE — Telephone Encounter (Signed)
Patient stated her insurance denied Prolistin-C. Patient will like to speak with someone concerning this.

## 2016-10-18 NOTE — Telephone Encounter (Signed)
Angela [name] from [employer] returning phone call.   [phone] Ext: [phone]

## 2016-10-19 NOTE — Telephone Encounter (Signed)
MR - pt's Prolastin was denied, please advise what you would like to include in letter for appeal. Thanks.

## 2016-10-19 NOTE — Telephone Encounter (Signed)
TRy for Zemaira and if denied again I can do appeal  Dr. Brand Males, M.D., [facility].C.P Pulmonary and Critical Care Medicine Staff Physician [place] [facility] Pager: [phone], If no answer or between  15:00h - 7:00h: call [phone]  10/19/2016 5:10 PM   g

## 2016-10-21 NOTE — Telephone Encounter (Signed)
[phone] ext [place]

## 2016-10-21 NOTE — Telephone Encounter (Signed)
The Zemaira is 60 mg/kg body weight unless other specified. I have called pt to inform her, the Zemaira start form has been left up front to be signed by the pt. Will await signature to fax form.

## 2016-10-21 NOTE — Telephone Encounter (Signed)
MR please advise on the dosing and mg of the Zemaira. thanks

## 2016-10-25 ENCOUNTER — Telehealth: Payer: Self-pay | Admitting: Internal Medicine

## 2016-10-25 NOTE — Telephone Encounter (Signed)
Will forward to EE to follow up on.

## 2016-10-25 NOTE — Telephone Encounter (Signed)
Pt has signed this and is now needing MR's signature, placed in MR's look-at's. Will update chart once returned back to me.

## 2016-10-25 NOTE — Telephone Encounter (Signed)
Calling needing copy of denial letter and wanting to know if MR is going to do an appeal or have appeals nurse do it  They can be reached @ [phone] ext [place].Hillery Hunter

## 2016-10-26 NOTE — Telephone Encounter (Signed)
[facility] called and stated they spoke with the pt. And she stated Dr. Chase Caller changed for her Esbritet. They wanted to verify with what he changed it to. According to MR's notes he did in fact change it. Gave a verbal with the pharmacist. Nothing further is needed at this time.   3. Brand Males, MD (Physician) at 10/10/2016 2:56 PM - Signed      ICD-9-CM ICD-10-CM   1. Pulmonary emphysema with fibrosis of lung (HCC) 492.8 J43.9    515 J84.10   2. IPF (idiopathic pulmonary fibrosis) (HCC) 516.31 J84.112   3. Pulmonary emphysema, unspecified emphysema type ([place]) 492.8 J43.9   4. Alpha-1-antitrypsin deficiency ([place]) 273.4 E88.01   5. Encounter for therapeutic drug monitoring V58.83 Z51.81    Progressive disease oct 2016 through oct 2017 Significant side effect with esbriet (fatigue, nausea, bloated sensation)  Plan  - start prolastin replacement for MZ phenotype with decline lung function and repeated flare up  - continue esbriet but reduce dose to to 2 pill three times daily - take with food and ginger - check LFT 10/10/2016; will call with results  Followup  6 weeks with me or NP - morning appt only to report progress and check status on prolastin

## 2016-10-31 ENCOUNTER — Telehealth: Payer: Self-pay | Admitting: Internal Medicine

## 2016-10-31 NOTE — Telephone Encounter (Signed)
[phone] pt calling back

## 2016-10-31 NOTE — Telephone Encounter (Signed)
Attempted to contact pt. No answer, no option to leave a message. Will try back.  

## 2016-10-31 NOTE — Telephone Encounter (Signed)
ATC, NA and no option to leave msg, WCB

## 2016-11-01 NOTE — Telephone Encounter (Signed)
Letter created and appeals letter faxed back to [facility] [phone] Will send back to [facility] to follow up

## 2016-11-01 NOTE — Telephone Encounter (Signed)
Pt calling back again.

## 2016-11-01 NOTE — Telephone Encounter (Signed)
Pt was denies for Prolastin, MR would like to pursue Zemaira. Spoke with drug rep Heidi Dach and was advised that the pt is a mild case for replacement therapy. Please see phone note from 12.4.17. Called and made pt aware. Pt verbalized understanding and denied any further questions or concerns at this time.

## 2016-11-01 NOTE — Telephone Encounter (Signed)
Results for [name], [name] (MRN [id number]) as of 11/01/2016 16:50  Ref. Range 08/28/2015 16:37 03/04/2016 12:53 08/16/2016 10:21  FVC-Pre Latest Units: L 2.81 2.79 2.69  FVC-%Pred-Pre Latest Units: % 79 79 76  FEV1-Pre Latest Units: L 2.10 2.10 2.11  FEV1-%Pred-Pre Latest Units: % 77 78 79  Results for [name], [name] (MRN [id number]) as of 11/01/2016 16:50  Ref. Range 08/28/2015 16:37 03/04/2016 12:53 08/16/2016 10:21  DLCO unc Latest Units: ml/min/mmHg 15.56 14.76 14.10  DLCO unc % pred Latest Units: % 28 50 21    Angela [name]  LEt us do a letter stating that patient is MZ but having progressive loss of lung function as documented above + CT shows she has emphysema. Therefore, we think she will benefit from [facility]  THanks  Dr. Brand Males, M.D., [facility].C.P Pulmonary and Critical Care Medicine Staff Physician [place] [facility] Pager: [phone], If no answer or between  15:00h - 7:00h: call [phone]  11/01/2016 4:51 PM

## 2016-11-01 NOTE — Telephone Encounter (Signed)
Spoke with Prolastin drug rep Heidi Dach and was advised that because pt's phenotype is MZ and she is considered 'mild' then most of the alpha 1 replacement therapies will deny the pt therapy. Marlou Sa states that if MR is very sure pt will benefit from therapy then we can try and pursue the Prolastin and they can help get it covered or we can wait on therapy.   MR please advise. Thanks.

## 2016-11-03 ENCOUNTER — Telehealth: Payer: Self-pay | Admitting: Internal Medicine

## 2016-11-03 NOTE — Telephone Encounter (Signed)
Will await fax.

## 2016-11-04 NOTE — Telephone Encounter (Signed)
EE please advise if you have received this form from [facility].  thanks

## 2016-11-04 NOTE — Telephone Encounter (Signed)
Yes, I received this. This has been completed and faxed back to [facility] on 12.21.17. Please see phone note from 12.4.17 for more information. Will sign off.

## 2016-11-08 ENCOUNTER — Ambulatory Visit (INDEPENDENT_AMBULATORY_CARE_PROVIDER_SITE_OTHER): Payer: Medicare Other | Admitting: Internal Medicine

## 2016-11-08 ENCOUNTER — Encounter: Payer: Self-pay | Admitting: Internal Medicine

## 2016-11-08 ENCOUNTER — Telehealth: Payer: Self-pay | Admitting: Internal Medicine

## 2016-11-08 VITALS — BP 160/72 | HR 64

## 2016-11-08 DIAGNOSIS — R05 Cough: Secondary | ICD-10-CM

## 2016-11-08 DIAGNOSIS — J441 Chronic obstructive pulmonary disease with (acute) exacerbation: Secondary | ICD-10-CM | POA: Diagnosis not present

## 2016-11-08 DIAGNOSIS — R058 Other specified cough: Secondary | ICD-10-CM

## 2016-11-08 MED ORDER — DOXYCYCLINE HYCLATE 100 MG PO TABS: 100.0000 mg | ORAL_TABLET | Freq: Two times a day (BID) | ORAL | 0 refills | Status: DC

## 2016-11-08 MED ORDER — TIOTROPIUM BROMIDE MONOHYDRATE 1.25 MCG/ACT IN AERS: 2.0000 | INHALATION_SPRAY | Freq: Every day | RESPIRATORY_TRACT | 3 refills | Status: DC

## 2016-11-08 MED ORDER — PREDNISONE 10 MG PO TABS: ORAL_TABLET | ORAL | 0 refills | Status: DC

## 2016-11-08 NOTE — Telephone Encounter (Signed)
AT[facility] pharmacy-unable to reach caller. Will forward to [place] as she is familiar with patient and which medication this is for.

## 2016-11-08 NOTE — Telephone Encounter (Signed)
Spoke with pt. She has been scheduled to see MW today at 11:30am. Nothing further was needed.

## 2016-11-08 NOTE — Progress Notes (Signed)
Subjective:     Patient ID: Angela [name], female   DOB: 1945-12-08,     MRN: [id number]     Brief patient profile:  OV 10/10/2016  MR Chief Complaint  Patient presents with  . Follow-up    Pt states overall her breathing has slightly improved since last OV. Pt states she has prod cough with yellow. Pt c/o midsternal CP 20 min after 2nd or 3rd dose of Esbriet.   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 This was also personally visualized. The main issues that were 3+3 times daily of Pirfenidone (Esbriet) she's having significant GI side effects of fatigue, nausea and bloated sensation and even chest pain after her dose. She's tried ginger she's had between meals. She has pacingg doses for several hours. All and communication with the help desk - but no improvement. She rates his symptoms is severe. Nevertheless because of the severity of pulmonary fibrosis and she is willing to continue with the medications although at a lower dose. She's not interested in switching over to  Ofev at this point in time. In the past she's not being in her about participating in the Pulm fibrosis foundation support group or research trials. She's not lost weight. Not yet had transplant discussion in detail. She is asking about alpha one replacement therapy because of the decline in lung function and also recurrent exacerbations with the antibiotic courses since last visit.  rec  - start prolastin replacement for MZ phenotype with decline lung function and repeated flare up> denied  - continue esbriet but reduce dose to to 2 pill three times daily - take with food and ginger    11/08/2016 acute extended ov/[place] re:  GOLD 0 copd/ MZ quit smoking 2013 / underlying ILD on perfenidone  Chief Complaint  Patient presents with  . Acute Visit     coughing so much it burns, used nebulizer to help, congestion with clear/yellow mucus, headache  new cough that caused vomiting late 90s  zpak did not work/ doxy/pred  Worked last flare and lasted  4-5 weeks then acutely worse x one day prior to OV with severe coughing fits and midline cp brought on by coughing some better with neb/ ? spiriva dpi making it worse   No obvious day to day or daytime variability or assoc   mucus plugs or hemoptysis or cp or chest tightness, subjective wheeze or overt sinus or hb symptoms. No unusual exp hx or h/o childhood pna/ asthma or knowledge of premature birth.  Sleeping ok without nocturnal  or early am exacerbation  of respiratory  c/o's or need for noct saba. Also denies any obvious fluctuation of symptoms with weather or environmental changes or other aggravating or alleviating factors except as outlined above   Current Medications, Allergies, Complete Past Medical History, Past Surgical History, Family History, and Social History were reviewed in [employer] record.  ROS  The following are not active complaints unless bolded sore throat, dysphagia, dental problems, itching, sneezing,  nasal congestion or excess/ purulent secretions, ear ache,   fever, chills, sweats, unintended wt loss, classically pleuritic or exertional cp,  orthopnea pnd or leg swelling, presyncope, palpitations, abdominal pain, anorexia, nausea, vomiting, diarrhea  or change in bowel or bladder habits, change in stools or urine, dysuria,hematuria,  rash, arthralgias, visual complaints, headache, numbness, weakness  or ataxia or problems with walking or coordination,  change in mood/affect or memory.                         Objective:   Physical Exam    Obese amb wf nad  Wt Readings from Last 3 Encounters:  10/14/16 209 lb (94.8 kg)  10/10/16 209 lb (94.8 kg)  07/07/16 210 lb (95.3 kg)    Vital signs reviewed  - Note on arrival 02 sats  98% on RA            HENT:  Head: Normocephalic and atraumatic.  Right Ear: External ear normal.  Left Ear: External ear normal.  Mouth/Throat: Oropharynx is clear and moist. No oropharyngeal exudate.  Eyes: Conjunctivae and EOM are normal. Pupils are equal, round, and reactive to light. Right eye exhibits no discharge. Left eye exhibits no discharge. No scleral icterus.  Neck: Normal range of motion. Neck supple. No JVD present. No tracheal deviation present. No thyromegaly present.  Cardiovascular: Normal rate, regular rhythm, normal heart sounds and intact distal pulses.  Exam reveals no gallop and no friction rub.   No murmur heard. Pulmonary/Chest: Effort normal. No respiratory distress. She has no wheezes. She has rales. She exhibits no tenderness.  Crackles at base / mostly pseudowheeze  Abdominal: Soft. Bowel sounds are normal. She exhibits no distension and no mass. There is no tenderness. There is no rebound and no guarding.  Musculoskeletal: Normal range of motion. She exhibits no edema or tenderness.  Lymphadenopathy:    She has no cervical adenopathy.  Neurological: She is alert and oriented to person, place, and time. She has normal reflexes. No cranial nerve deficit. She exhibits normal muscle tone. Coordination normal.  Skin: Skin is warm and dry. No rash noted. She is not diaphoretic. No erythema. No pallor.  Psychiatric: She has a normal mood and affect. Her behavior is normal. Judgment and thought content normal.  Vitals reviewed.        Assessment:

## 2016-11-08 NOTE — Patient Instructions (Addendum)
As long as coughing/ anytime your cough starts back up > Try prilosec otc 20mg   Take 30-60 min before first meal of the day and Pepcid ac (famotidine) 20 mg one @  bedtime until cough is completely gone for at least a week without the need for cough suppression  Doxy 100 mg twice daily x 10 days  Prednisone 10 mg take  4 each am x 2 days,   2 each am x 2 days,  1 each am x 2 days and stop   Stop spiriva powder and start respimat 2 pffs each am  GERD (REFLUX)  is an extremely common cause of respiratory symptoms just like yours , many times with no obvious heartburn at all.    It can be treated with medication, but also with lifestyle changes including elevation of the head of your bed (ideally with 6 inch  bed blocks),  Smoking cessation, avoidance of late meals, excessive alcohol, and avoid fatty foods, chocolate, peppermint, colas, red wine, and acidic juices such as orange juice.  NO MINT OR MENTHOL PRODUCTS SO NO COUGH DROPS  USE SUGARLESS CANDY INSTEAD (Jolley ranchers or Stover's or Life Savers) or even ice chips will also do - the key is to swallow to prevent all throat clearing. NO OIL BASED VITAMINS - use powdered substitutes.    Keep appt to see Dr Chase Caller

## 2016-11-15 DIAGNOSIS — R05 Cough: Secondary | ICD-10-CM | POA: Insufficient documentation

## 2016-11-15 DIAGNOSIS — R058 Other specified cough: Secondary | ICD-10-CM | POA: Insufficient documentation

## 2016-11-15 NOTE — Assessment & Plan Note (Signed)
Try off dpi 11/08/2016 and on gerd rx whenever cough flares   Upper airway cough syndrome (previously labeled PNDS) , is  so named because it's frequently impossible to sort out how much is  CR/sinusitis with freq throat clearing (which can be related to primary GERD)   vs  causing  secondary (" extra esophageal")  GERD from wide swings in gastric pressure that occur with throat clearing, often  promoting self use of mint and menthol lozenges that reduce the lower esophageal sphincter tone and exacerbate the problem further in a cyclical fashion.   These are the same pts (now being labeled as having "irritable larynx syndrome" by some cough centers) who not infrequently have a history of having failed to tolerate ace inhibitors,  dry powder inhalers or biphosphonates or report having atypical/extraesophageal reflux symptoms that don't respond to standard doses of PPI  and are easily confused as having aecopd or asthma flares by even experienced allergists/ pulmonologists (myself included).   For now will try off dpi/ on gerd rx whenever cough flares.  In addition based on work of Use of PPI is associated with improved survival time and with decreased radiologic fibrosis per King's study published in [facility] vol 184 p1390.  Dec 2011 and also may have other beneficial effects as per the latest review in [place] vol 193 [id number] Jun 20016.  This may not always be cause and effect, but certainly for flares should be utilized  and if helps then also consider longterm rx   see avs for instructions unique to this ov

## 2016-11-15 NOTE — Assessment & Plan Note (Addendum)
PFT's  08/16/2016  FEV1 2.11 (79 % ) ratio 79  p no % improvement from saba p ? spiriva prior to study with DLCO  48/47c % corrects to 61  % for alv volume  - 11/08/2016  After extensive coaching device  effectiveness =    90% with respimat so changed from dpi  Not really clear these flares are copd related as she only has GOLD 0 findings and no better on spiriva dpi but it may be that the dpi is paradoxically making it worse because it's contributing to upper airway irritation   Will therefore repeat the doxy/pred rx that helped with last flare and change to the respimat   I had an extended discussion with the patient reviewing all relevant studies completed to date and  lasting 15 to 20 minutes of a 25 minute visit    Each maintenance medication was reviewed in detail including most importantly the difference between maintenance and prns and under what circumstances the prns are to be triggered using an action plan format that is not reflected in the computer generated alphabetically organized AVS.    Please see AVS for specific instructions unique to this visit that I personally wrote and verbalized to the the pt in detail and then reviewed with pt  by my nurse highlighting any  changes in therapy recommended at today's visit to their plan of care.

## 2016-11-15 NOTE — Telephone Encounter (Signed)
Spoke with [facility]  She states that the pt told them that she was no longer going to need Prolastin, and she was going to take Zemaira They are needing to verify this and see if okay to close her account for Prolastin  MR- please advise.  I see Marinell Blight was mentioned but just want to double check. Thanks

## 2016-11-16 NOTE — Telephone Encounter (Signed)
I recommended zemeira because proloastin got denied but I do not know if zemeria has been accepted as yet. In interim, the proloastin rep suggested we do appeal for proloastin because zemaira also might be denied. I am forwarding to [facility] to address issue  Thanks  Dr. Brand Males, M.D., [facility].C.P Pulmonary and Critical Care Medicine Staff Physician [place] [facility] Pager: [phone], If no answer or between  15:00h - 7:00h: call [phone]  11/16/2016 9:06 AM

## 2016-11-21 NOTE — Telephone Encounter (Signed)
Called Heidi Dach, rep for Prolastin, and he states if the appeal was denied we need to fax this to Maudie Mercury at [phone] so she is able to help with the denial. I called [facility] provider line on the back of the insurance card to check on status but the lines were crossed with another company and could not reach [facility]. Called Ryan with [facility] at [phone] and left a VM to check on status, if she states the med has been denied we will need the denial letter, I do not have this.

## 2016-11-21 NOTE — Telephone Encounter (Signed)
[phone] ext [phone] Maudie Mercury calling about Prolastin

## 2016-11-22 ENCOUNTER — Other Ambulatory Visit: Payer: Self-pay | Admitting: Family Medicine

## 2016-11-22 DIAGNOSIS — Z1231 Encounter for screening mammogram for malignant neoplasm of breast: Secondary | ICD-10-CM

## 2016-11-22 NOTE — Telephone Encounter (Signed)
lmtcb X1 for Angela [name] with Prolastin.

## 2016-11-22 NOTE — Telephone Encounter (Signed)
Spoke with Zella Richer with [place], states that she needs 2 denial letters for Prolastin as well as any clinical supporting documentation sent with original PA and appeal faxed to her at [phone].  Forwarding to [place] to follow up on.

## 2016-11-24 NOTE — Telephone Encounter (Signed)
See my documentation earlier in the phone note, I was unsure if the medication has been denied or not, I have not received notification either way. I called [facility] and spoke to a rep and was advised the pt's medication has been approved from 12.22.2017-12.31.2018. [place] and spoke to [place]. Maudie Mercury states she will arrange for shipment and will call us back when this has been done. Will await call back.   LMTCB for pt to inform.

## 2016-11-25 NOTE — Telephone Encounter (Signed)
Pt returning call.Angela [name] ° °

## 2016-11-25 NOTE — Telephone Encounter (Signed)
[phone] pt calling back

## 2016-11-25 NOTE — Telephone Encounter (Signed)
lmtcb x2 for pt. 

## 2016-11-25 NOTE — Telephone Encounter (Signed)
lmtcb x1 for pt. 

## 2016-11-25 NOTE — Telephone Encounter (Signed)
LM x 1 

## 2016-11-28 ENCOUNTER — Telehealth: Payer: Self-pay | Admitting: Internal Medicine

## 2016-11-28 NOTE — Telephone Encounter (Signed)
Spoke with pt, states she is switching insurance companies next month- she will know what company she will be with tomorrow but won't have plan information until February.  Because of this, pt does not wish to proceed with Prolastin at this time, wishes to wait until after she is set up with new insurance company to see what they cover.  I advised pt to provide [place] with updated insurance info as soon as she has it as to not further delay her treatment.  Pt expressed understanding. Forwarding to MR and Daneil Dan as Juluis Rainier.

## 2016-11-28 NOTE — Telephone Encounter (Signed)
LM for patient to contact our office

## 2016-11-28 NOTE — Telephone Encounter (Signed)
Patient is returning phone call.  °

## 2016-11-28 NOTE — Telephone Encounter (Signed)
Will sign off on message, please see phone note from 11/28/16 as pt wishes to hold off on starting prolastin till her new insurance.

## 2016-11-28 NOTE — Telephone Encounter (Signed)
lmtcb for pt. Spoke with clarification with Daneil Dan- we are letting the pt know that her Prolastin has been approved, and that the pharmacy will be contacting her to arrange shipment- they cannot ship this medication until they speak with her.

## 2016-12-02 ENCOUNTER — Ambulatory Visit (INDEPENDENT_AMBULATORY_CARE_PROVIDER_SITE_OTHER): Payer: Medicare Other | Admitting: Internal Medicine

## 2016-12-02 ENCOUNTER — Encounter: Payer: Self-pay | Admitting: Internal Medicine

## 2016-12-02 ENCOUNTER — Other Ambulatory Visit (INDEPENDENT_AMBULATORY_CARE_PROVIDER_SITE_OTHER): Payer: Medicare Other

## 2016-12-02 VITALS — BP 142/72 | HR 66 | Ht 67.5 in | Wt 211.4 lb

## 2016-12-02 DIAGNOSIS — J8410 Pulmonary fibrosis, unspecified: Secondary | ICD-10-CM

## 2016-12-02 DIAGNOSIS — E8801 Alpha-1-antitrypsin deficiency: Secondary | ICD-10-CM

## 2016-12-02 DIAGNOSIS — J439 Emphysema, unspecified: Secondary | ICD-10-CM

## 2016-12-02 DIAGNOSIS — J84112 Idiopathic pulmonary fibrosis: Secondary | ICD-10-CM | POA: Diagnosis not present

## 2016-12-02 LAB — HEPATIC FUNCTION PANEL
ALT: 17 U/L (ref 0–35)
AST: 16 U/L (ref 0–37)
Albumin: 4 g/dL (ref 3.5–5.2)
Alkaline Phosphatase: 116 U/L (ref 39–117)
BILIRUBIN DIRECT: 0 mg/dL (ref 0.0–0.3)
BILIRUBIN TOTAL: 0.3 mg/dL (ref 0.2–1.2)
TOTAL PROTEIN: 7.4 g/dL (ref 6.0–8.3)

## 2016-12-02 NOTE — Progress Notes (Signed)
Subjective:     Patient ID: Angela [name], female   DOB: May 24, 1946, 71 y.o.   MRN: [id number]  HPI   OV 10/10/2016  Chief Complaint  Patient presents with  . Follow-up    Pt states overall her breathing has slightly improved since last OV. Pt states she has prod cough with yellow. Pt c/o midsternal CP 20 min after 2nd or 3rd dose of Esbriet.     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 This was also personally visualized. The main issues that were 3+3 times daily of Pirfenidone (Esbriet) she's having significant GI side effects of fatigue, nausea and bloated sensation and even chest pain after her dose. She's tried ginger she's had between meals. She has pacingg doses for several hours. All and communication with the help desk - but no improvement. She rates his symptoms is severe. Nevertheless because of the severity of pulmonary fibrosis and she is willing to continue with the medications although at a lower dose. She's not interested in switching over to  Ofev at this point in time. In the past she's not being in her about participating in the Pulm fibrosis foundation support group or research trials. She's not lost weight. Not yet had transplant discussion in detail. She is asking about alpha one replacement therapy because of the decline in lung function and also recurrent exacerbations with the antibiotic courses since last visit.     for Angela, [name] (MRN [id number]) as of 10/10/2016 14:56  Ref. Range 08/28/2015 15:36 03/04/2016 12:53 08/16/2016 10:21  FVC-Pre Latest Units: L 2.81 2.79 2.69  FVC-%Pred-Pre Latest Units: % 79 79 76   Results for Angela, [name] (MRN [id number]) as of 10/10/2016 14:56  Ref. Range 08/28/2015 15:36 03/04/2016 12:53 08/16/2016 10:21  DLCO unc Latest Units: ml/min/mmHg 15.56 14.76 14.10   DLCO unc % pred Latest Units: % 53 50 48   OV 10/10/2016  MR Chief Complaint  Patient presents with  . Follow-up    Pt states overall her breathing has slightly improved since last OV. Pt states she has prod cough with yellow. Pt c/o midsternal CP 20 min after 2nd or 3rd dose of Esbriet.   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 This was also personally visualized. The main issues that were 3+3 times daily of Pirfenidone (Esbriet) she's having significant GI side effects of fatigue, nausea and bloated sensation and even chest pain after her dose. She's tried ginger she's had between meals. She has pacingg doses for several hours. All and communication with the help desk - but no improvement. She rates his symptoms is severe. Nevertheless because of the severity of pulmonary fibrosis and she is willing to continue with the medications although at a lower dose. She's not interested in switching over to  Ofev at this point in time. In the past she's not being in her about participating in the Pulm fibrosis foundation support group or research trials. She's not lost weight. Not yet had transplant discussion in detail. She is asking about alpha one replacement therapy because of the decline in lung function and also recurrent exacerbations with the antibiotic courses since last visit.  rec  - start prolastin replacement  for MZ phenotype with decline lung function and repeated flare up> denied by insurance  - continue esbriet but reduce dose to to 2 pill three times daily - take with food and ginger    11/08/2016 acute extended ov/Wert re:  GOLD 0 copd/ MZ quit smoking 2013 / underlying ILD on perfenidone  Chief Complaint  Patient presents with  . Acute Visit    coughing so much it burns, used nebulizer to help, congestion  with clear/yellow mucus, headache  new cough that caused vomiting late 90s  zpak did not work/ doxy/pred  Worked last flare and lasted  4-5 weeks then acutely worse x one day prior to OV with severe coughing fits and midline cp brought on by coughing some better with neb/ ? spiriva dpi making it worse   No obvious day to day or daytime variability or assoc   mucus plugs or hemoptysis or cp or chest tightness, subjective wheeze or overt sinus or hb symptoms. No unusual exp hx or h/o childhood pna/ asthma or knowledge of premature birth.  Sleeping ok without nocturnal  or early am exacerbation  of respiratory  c/o's or need for noct saba. Also denies any obvious fluctuation of symptoms with weather or environmental changes or other aggravating or alleviating factors except as outlined above  OV 12/02/2016  Chief Complaint  Patient presents with  . Follow-up    Pt is here for 6 wk f/u. Pt fibrosis has remained unchanged, but has a question about the esbriet and prolastin    Follow-up idiopathic pulmonary fibrosis Emphysema MZ phenotype  Overall stable since last visit. Alpha-1 replacement has been approved but because of her  Ex-husband's death she has more money now and therefore her co-pay for both Prolastin and esbriet will increase. She's been worked [employer] to keep the co-pay cost down. If she does not succeed with this she will give up on both drugs. Prolastin has not been started yet. No new issues.      has a past medical history of Alpha 1-antitrypsin PiMS phenotype; Anemia (2014); Chronic kidney disease; Complication of anesthesia; COPD (chronic obstructive pulmonary disease) ([place]); DDD (degenerative disc disease); Depression; Diabetes mellitus; Diverticulitis; Diverticulosis; Esophageal dysmotility (03/01/06); Fibromyalgia; Hypertension; Shingles; Shortness of breath dyspnea; Sleep apnea; and Stroke ([place]) (2010).   reports that she quit smoking about 4 years ago. Her smoking use  included Cigarettes. She has a 22.00 pack-year smoking history. She has never used smokeless tobacco.  Past Surgical History:  Procedure Laterality Date  . ABDOMINAL HYSTERECTOMY    . ANTERIOR FUSION CERVICAL SPINE    . APPENDECTOMY    . BACK SURGERY    . DE QUERVAIN'S RELEASE     right  . GASTRIC FUNDOPLICATION    . HERNIA REPAIR    . KNEE ARTHROSCOPY     left   . LUNG BIOPSY Left 03/23/2016   Procedure: LUNG BIOPSY;  Surgeon: Melrose Nakayama, MD;  Location: [place];  Service: Thoracic;  Laterality: Left;  . LYMPH NODE BIOPSY Left 03/23/2016   Procedure: LYMPH NODE BIOPSY;  Surgeon: Melrose Nakayama, MD;  Location: [place];  Service: Thoracic;  Laterality: Left;  . POSTERIOR CERVICAL FUSION/FORAMINOTOMY N/A 03/16/2015   Procedure: POSTERIOR CERVICAL LAMINECTOMY AND DISKECTOMOY AT CERVICAL SEVEN-THORACIC ONE ;POSTERIOR CERVICAL FUSION CERVICAL SIX TO THORACIC ONE;  Surgeon: Kary Kos, MD;  Location: [place] [facility];  Service: Neurosurgery;  Laterality: N/A;  Site is Cervical/Thoracic  . TONSILLECTOMY    . VIDEO ASSISTED  THORACOSCOPY Left 03/23/2016   Procedure: VIDEO ASSISTED THORACOSCOPY;  Surgeon: Melrose Nakayama, MD;  Location: [place];  Service: Thoracic;  Laterality: Left;    Allergies  Allergen Reactions  . Codeine Nausea And Vomiting  . Iohexol      Desc: Pt states several years ago during a CT scan w/ iv cotnrast she had severe nausea and vomiting, sob w/ difficulty breathing and swallowing.  She had full 13hr. premeds today (5/14/7) and did fine w/o complications.   . Tizanidine Other (See Comments)    Severe decrease in BP   . Ciprofloxacin Rash    Immunization History  Administered Date(s) Administered  . Pneumococcal-Unspecified 11/14/2012  . Tdap 09/05/2016    Family History  Problem Relation Age of Onset  . COPD Father   . Stomach cancer Maternal Grandfather   . Colon cancer Maternal Aunt      Current Outpatient Prescriptions:  .  acetaminophen  (TYLENOL) 500 MG tablet, Take 2 tablets (1,000 mg total) by mouth every 6 (six) hours as needed for mild pain or fever., Disp: 30 tablet, Rfl: 0 .  albuterol (PROVENTIL) (2.5 MG/3ML) 0.083% nebulizer solution, INAHLE CONTENTS OF ONE VIAL VIA NEBULIZER EVERY 6 HOURS AS NEEDED FOR WHEEZING OR SHORTNESS OF BREATH, Disp: 900 mL, Rfl: 1 .  atorvastatin (LIPITOR) 40 MG tablet, Take 1 tablet by mouth daily., Disp: , Rfl: 11 .  B Complex-C (SUPER B-C PO), Take 1 tablet by mouth daily at 12 noon. , Disp: , Rfl:  .  buPROPion (WELLBUTRIN XL) 300 MG 24 hr tablet, Take 1 tablet by mouth every morning., Disp: , Rfl:  .  cyclobenzaprine (FLEXERIL) 10 MG tablet, Take 10 mg by mouth at bedtime as needed for muscle spasms. , Disp: , Rfl: 0 .  fluticasone (FLONASE) 50 MCG/ACT nasal spray, Place 2 sprays into both nostrils daily as needed for allergies. , Disp: , Rfl: 4 .  hydrochlorothiazide (MICROZIDE) 12.5 MG capsule, Take 1 capsule (12.5 mg total) by mouth daily., Disp: 90 capsule, Rfl: 3 .  losartan (COZAAR) 25 MG tablet, TAKE 1 TABLET(25 MG) BY MOUTH DAILY, Disp: 90 tablet, Rfl: 1 .  Menthol, Topical Analgesic, (BIOFREEZE) 4 % GEL, Apply 1 application topically 3 (three) times daily as needed (for back pain)., Disp: , Rfl:  .  metFORMIN (GLUCOPHAGE) 500 MG tablet, Take 500 mg by mouth 2 (two) times daily with a meal., Disp: , Rfl:  .  metoprolol succinate (TOPROL-XL) 25 MG 24 hr tablet, TAKE 1 TABLET(25 MG) BY MOUTH DAILY, Disp: 90 tablet, Rfl: 3 .  Multiple Vitamin (MULTIVITAMIN) capsule, Take 1 capsule by mouth daily., Disp: , Rfl:  .  ondansetron (ZOFRAN ODT) 4 MG disintegrating tablet, Take 1 tablet (4 mg total) by mouth every 8 (eight) hours as needed for nausea or vomiting., Disp: 10 tablet, Rfl: 0 .  Pirfenidone (ESBRIET) 267 MG CAPS, Take 3 capsules by mouth 3 (three) times daily., Disp: 270 capsule, Rfl: 11 .  predniSONE (DELTASONE) 10 MG tablet, Take  4 each am x 2 days,   2 each am x 2 days,  1 each am  x 2 days and stop, Disp: 14 tablet, Rfl: 0 .  promethazine (PHENERGAN) 25 MG tablet, Take 25 mg by mouth every 6 (six) hours as needed for nausea or vomiting. Reported on 04/12/2016, Disp: , Rfl:  .  sertraline (ZOLOFT) 50 MG tablet, Take 50 mg by mouth daily., Disp: , Rfl:  .  Tiotropium Bromide Monohydrate (SPIRIVA RESPIMAT)  1.25 MCG/ACT AERS, Inhale 2 puffs into the lungs daily., Disp: 1 Inhaler, Rfl: 3 .  traMADol (ULTRAM) 50 MG tablet, Take 1-2 tablets (50-100 mg total) by mouth every 6 (six) hours as needed (mild pain)., Disp: 30 tablet, Rfl: 0 .  vitamin B-12 (CYANOCOBALAMIN) 500 MCG tablet, Take 500 mcg by mouth daily., Disp: , Rfl:  .  doxycycline (VIBRA-TABS) 100 MG tablet, Take 1 tablet (100 mg total) by mouth 2 (two) times daily. (Patient not taking: Reported on 12/02/2016), Disp: 14 tablet, Rfl: 0   Review of Systems     Objective:   Physical Exam  Constitutional: She is oriented to person, place, and time. She appears well-developed and well-nourished. No distress.  HENT:  Head: Normocephalic and atraumatic.  Right Ear: External ear normal.  Left Ear: External ear normal.  Mouth/Throat: Oropharynx is clear and moist. No oropharyngeal exudate.  Eyes: Conjunctivae and EOM are normal. Pupils are equal, round, and reactive to light. Right eye exhibits no discharge. Left eye exhibits no discharge. No scleral icterus.  Neck: Normal range of motion. Neck supple. No JVD present. No tracheal deviation present. No thyromegaly present.  Cardiovascular: Normal rate, regular rhythm, normal heart sounds and intact distal pulses.  Exam reveals no gallop and no friction rub.   No murmur heard. Pulmonary/Chest: Effort normal. No respiratory distress. She has no wheezes. She has rales. She exhibits no tenderness.  Abdominal: Soft. Bowel sounds are normal. She exhibits no distension and no mass. There is no tenderness. There is no rebound and no guarding.  Musculoskeletal: Normal range of motion.  She exhibits no edema or tenderness.  Lymphadenopathy:    She has no cervical adenopathy.  Neurological: She is alert and oriented to person, place, and time. She has normal reflexes. No cranial nerve deficit. She exhibits normal muscle tone. Coordination normal.  Skin: Skin is warm and dry. No rash noted. She is not diaphoretic. No erythema. No pallor.  Psychiatric: She has a normal mood and affect. Her behavior is normal. Judgment and thought content normal.  Vitals reviewed.   Vitals:   12/02/16 1020  BP: (!) 142/72  Pulse: 66  SpO2: 96%  Weight: 211 lb 6.4 oz (95.9 kg)  Height: 5' 7.5" (1.715 m)    Estimated body mass index is 32.62 kg/m as calculated from the following:   Height as of this encounter: 5' 7.5" (1.715 m).   Weight as of this encounter: 211 lb 6.4 oz (95.9 kg).       Assessment:         Plan:     Progressive disease oct 2016 through oct 2017 but currently clinically stable 12/02/2016   Significant side effect with esbriet (fatigue, nausea, bloated sensation) improved on lower dose of esbriet  Understood insurance benefit issues with proloastin and esbriet    Plan - continue inhalers and esbriet low dose  - hold off  prolastin replacement for MZ phenotype with decline lung function and repeated flare up  - due to insurance cost issue though drug approved  - continue esbriet atreduce dose to to 2 pill three times daily - take with food and ginger  -  check LFT 10/10/2016 normal  Followup 2 months do LFT check 1-2 months call us back with insurance status on esbriet and prolastin cost  6 months do pft (Pre-bd spiro and dlco only. No lung volume or bd response. No post-bd spiro) 6 months return to see DR Chase Caller   Dr. Brand Males, M.D.,  F.C.C.P Pulmonary and Critical Care Medicine Staff Physician [facility] [facility] Pager: [phone], If no answer or between  15:00h - 7:00h: call [phone]  12/02/2016 11:01 AM

## 2016-12-02 NOTE — Patient Instructions (Addendum)
ICD-9-CM ICD-10-CM   1. Pulmonary emphysema with fibrosis of lung (HCC) 492.8 J43.9 Hepatic function panel   515 J84.10 Pulmonary Function Test  2. IPF (idiopathic pulmonary fibrosis) (HCC) 516.31 J84.112   3. Alpha-1-antitrypsin deficiency ([place]) 273.4 E88.01     Progressive disease oct 2016 through oct 2017  Significant side effect with esbriet (fatigue, nausea, bloated sensation) improved on lower dose of esbriet  Understood insurance benefit issues with proloastin and esbriet    Plan  - hold off  prolastin replacement for MZ phenotype with decline lung function and repeated flare up  - due to insurance cost issue though drug approved  - continue esbriet atreduce dose to to 2 pill three times daily - take with food and ginger  -  check LFT 10/10/2016 normal  Followup 2 months do LFT check 1-2 months call us back with insurance status on esbriet and prolastin cost  6 months do pft (Pre-bd spiro and dlco only. No lung volume or bd response. No post-bd spiro) 6 months return to see DR Chase Caller

## 2016-12-07 NOTE — Progress Notes (Signed)
Spoke with patient and informed her of normal lab results. Pt did not have any additional questions. Nothing further needed.

## 2016-12-09 ENCOUNTER — Ambulatory Visit
Admission: RE | Admit: 2016-12-09 | Discharge: 2016-12-09 | Disposition: A | Discharge status: Home / Self Care | Payer: Medicare Other | Source: Ambulatory Visit | Attending: Family Medicine | Admitting: Family Medicine

## 2016-12-09 DIAGNOSIS — Z1231 Encounter for screening mammogram for malignant neoplasm of breast: Secondary | ICD-10-CM

## 2016-12-28 ENCOUNTER — Telehealth: Payer: Self-pay | Admitting: Internal Medicine

## 2016-12-28 NOTE — Telephone Encounter (Signed)
Form was received from [employer] and this will need to be filled out for the PA for the esbriet.  Placed in MR look at [place] and will forward message to [place] to follow up on.

## 2016-12-29 NOTE — Telephone Encounter (Signed)
Form was completed and additional clinical information has been faed back to [employer]. Will await decision.

## 2016-12-30 ENCOUNTER — Telehealth: Payer: Self-pay | Admitting: Internal Medicine

## 2016-12-30 NOTE — Telephone Encounter (Signed)
Called and had to [facility]

## 2017-01-02 NOTE — Telephone Encounter (Signed)
lmtcb for [facility] X2

## 2017-01-02 NOTE — Telephone Encounter (Signed)
Sonia Side with [employer] calling back.  States again that the request was denied and re-faxing denial paperwork with appeal information on it. She will be there for 1 more hour, direct line [phone].

## 2017-01-02 NOTE — Telephone Encounter (Signed)
LMTCB x2 for [facility]. Will await call back.

## 2017-01-02 NOTE — Telephone Encounter (Signed)
envision calling back [phone] the request was denied due to lack of response

## 2017-01-03 NOTE — Telephone Encounter (Signed)
[name] with [employer]in states spoke with patient a few minutes ago and patient states this was denied.  Wanting to know if we have recd denial letter.  CB is [phone] ext [phone].

## 2017-01-03 NOTE — Telephone Encounter (Signed)
Appeal information has been received. I have left a message with Nicole Kindred making him aware that we have received the denial letter.  MR - please advise if you want to proceed with appeal.   **Appeal information is in triage.**

## 2017-01-05 DIAGNOSIS — G4733 Obstructive sleep apnea (adult) (pediatric): Secondary | ICD-10-CM | POA: Diagnosis not present

## 2017-01-09 NOTE — Telephone Encounter (Signed)
Received fax from [employer] indicating the pt's Esbriet has been improved until 12.31.18 under Medicare part D. Pt aware. Nothing further needed at this time.

## 2017-01-11 ENCOUNTER — Telehealth: Payer: Self-pay | Admitting: Internal Medicine

## 2017-01-11 DIAGNOSIS — H40052 Ocular hypertension, left eye: Secondary | ICD-10-CM | POA: Diagnosis not present

## 2017-01-11 DIAGNOSIS — H5340 Unspecified visual field defects: Secondary | ICD-10-CM | POA: Diagnosis not present

## 2017-01-11 DIAGNOSIS — H40022 Open angle with borderline findings, high risk, left eye: Secondary | ICD-10-CM | POA: Diagnosis not present

## 2017-01-11 DIAGNOSIS — H11423 Conjunctival edema, bilateral: Secondary | ICD-10-CM | POA: Diagnosis not present

## 2017-01-11 DIAGNOSIS — H40011 Open angle with borderline findings, low risk, right eye: Secondary | ICD-10-CM | POA: Diagnosis not present

## 2017-01-11 DIAGNOSIS — H11153 Pinguecula, bilateral: Secondary | ICD-10-CM | POA: Diagnosis not present

## 2017-01-11 DIAGNOSIS — H353131 Nonexudative age-related macular degeneration, bilateral, early dry stage: Secondary | ICD-10-CM | POA: Diagnosis not present

## 2017-01-11 DIAGNOSIS — Z961 Presence of intraocular lens: Secondary | ICD-10-CM | POA: Diagnosis not present

## 2017-01-11 DIAGNOSIS — Z9849 Cataract extraction status, unspecified eye: Secondary | ICD-10-CM | POA: Diagnosis not present

## 2017-01-11 DIAGNOSIS — H18413 Arcus senilis, bilateral: Secondary | ICD-10-CM | POA: Diagnosis not present

## 2017-01-11 NOTE — Telephone Encounter (Signed)
Advised patient of PA approval.  She stated she has quit taking the Esbriet as it makes her stomach hurt.

## 2017-01-11 NOTE — Telephone Encounter (Signed)
Called patient and left voicemail to contact office for medication information. Pt will be informed once call is returned her esbriet medication was approved from 01/03/17-11/13/17 with reference number [id number] from [place].

## 2017-01-11 NOTE — Telephone Encounter (Signed)
Angela [name] with [employer] is calling back to check on status.  Advised of last msg update.   CB is [phone] ext [phone]

## 2017-01-11 NOTE — Telephone Encounter (Signed)
MR  Spoke with pt and she stated she has stopped taking her esbriet due to it causing her abdominal pain and making her feel sick. She states she is feeling much better since stopping it.

## 2017-01-12 NOTE — Telephone Encounter (Signed)
Understandabble. Pleas ask her to consider waiting 2 weeks and then  restart with 1 pilll tid and then a week later go to 2 pill tid and jus tstaying there. Lower dose effective against IPF and maybe she dont  Have side effects  Dr. Brand Males, M.D., [facility].C.P Pulmonary and Critical Care Medicine Staff Physician [place] [facility] Pager: [phone], If no answer or between  15:00h - 7:00h: call [phone]  01/12/2017 4:45 PM

## 2017-01-12 NOTE — Telephone Encounter (Signed)
Dr Chase Caller please advise how you wish to proceed with PA Appeal

## 2017-01-12 NOTE — Telephone Encounter (Signed)
lmomtcb x1 

## 2017-01-12 NOTE — Telephone Encounter (Signed)
Yes I want t  To proceed with appeal. The prollastin rep ? [name] was at office 01/12/2017 and he said they can help with appeal . I am fine with them trying appeal if is hipaa complian and patient ok with the process  Thanks  Dr. Brand Males, M.D., [facility].C.P Pulmonary and Critical Care Medicine Staff Physician [place] [facility] Pager: [phone], If no answer or between  15:00h - 7:00h: call [phone]  01/12/2017 4:46 PM

## 2017-01-13 NOTE — Telephone Encounter (Signed)
See phone note from 01/11/17

## 2017-01-13 NOTE — Telephone Encounter (Signed)
Patient returning call - she can be reached at [phone] -pr  °

## 2017-01-13 NOTE — Telephone Encounter (Signed)
lmomtcb x 2  

## 2017-01-13 NOTE — Telephone Encounter (Signed)
Spoke with pt and gave her MR recommendation. Pt expressed she does not want to take this medication at all. Nothing further is needed at this time.

## 2017-01-19 ENCOUNTER — Telehealth: Payer: Self-pay | Admitting: Internal Medicine

## 2017-01-19 NOTE — Telephone Encounter (Signed)
Per previous documentation, MR wanted an appeal to be started on Prolastin. The appeal information is no longer in triage.  Daneil Dan - has this appeal been started?

## 2017-01-19 NOTE — Telephone Encounter (Signed)
No, the appeal was not started as the phone note from 2.16.18 was never routed to me and maintained a triage message. I do not have the appeal information that was in triage.   LMTCB for Nicole Kindred.

## 2017-01-20 NOTE — Telephone Encounter (Signed)
Nicole Kindred returned call and said appeal information would have to be resent from [facility] and states you can call them [phone]. Nicole Kindred, [place].

## 2017-01-20 NOTE — Telephone Encounter (Signed)
lmom tcb x1 to tony, left on vm the triage fax number [phone])  to re-send information for the appeal

## 2017-01-24 NOTE — Telephone Encounter (Signed)
Angela [name] called - pt's insurance denied the Prolastin and in trying to be proactive he contacted pt's insurance company to have them fax the Prolastin denial to the office.  This should be received by this afternoon.  Angela [name] would like to know what route MR would like to take next - continue with the denial?  Denial not yet received Spoke with Daneil Dan to update her - pt was in the process of changing insurances in Jan 2018.  Did she do this? Called spoke with patient, she did change to [employer] Dec 15, 2016 and she sent a copy of the new insurance card to [employer] spoke with Angela [name] - verified that the Prolastin was done/denied through this new insurance  MR please advise what direction you would like to go with the Prolastin denial.  Thank you.

## 2017-01-27 NOTE — Telephone Encounter (Signed)
MR please advise thanks  

## 2017-01-31 NOTE — Telephone Encounter (Signed)
MR please advise thanks  

## 2017-02-03 NOTE — Telephone Encounter (Signed)
D/ Angela [name] yesterday - check patient if patient willing for her info to be given to [employer] for working on insurance denial. If so, ok with me  Dr. Brand Males, M.D., [facility].C.P Pulmonary and Critical Care Medicine Staff Physician [place] [facility] Pager: [phone], If no answer or between  15:00h - 7:00h: call [phone]  02/03/2017 8:34 AM

## 2017-02-03 NOTE — Telephone Encounter (Signed)
lmomtcb x1 

## 2017-02-06 NOTE — Telephone Encounter (Signed)
Spoke with pt and she stated she is ok with going forward but please note will only take if covered by her insurance.  Daneil Dan have you received any denial letters?

## 2017-02-09 NOTE — Telephone Encounter (Signed)
I have not received the denial letter. I called [employer] at [phone] and spoke to [place]. She will fax over the denial letter to front fax. Once this is received it will need to be sent to [employer] so they can help with the appeal. Heidi Dach is aware of this.

## 2017-02-13 NOTE — Telephone Encounter (Signed)
Checked with Daneil Dan, denial letter has not yet been received

## 2017-02-14 NOTE — Telephone Encounter (Signed)
Rose with [employer] (direct callback number is [phone] ), is refaxing denial letter to office so we can initiate appeal.  Will hold to look out for denial letter.

## 2017-02-14 NOTE — Telephone Encounter (Signed)
Checked with Daneil Dan, both fax machines, and up front folder and nothing in this pt  Called [employer] and LMTCB to see if they can send it as requested on 3/29

## 2017-02-20 NOTE — Telephone Encounter (Signed)
Spoke with Angela [name], drug rep. He states Angela [name] with [employer] has the pt's denial letter and will start the appeals process this week. Angela [name] will email me with updates, I will update the chart when I receive emails.

## 2017-02-20 NOTE — Telephone Encounter (Signed)
Angela [name] did you receive the denial letter?

## 2017-02-22 ENCOUNTER — Telehealth: Payer: Self-pay | Admitting: Internal Medicine

## 2017-02-22 NOTE — Telephone Encounter (Signed)
lmomtcb x1 

## 2017-02-23 NOTE — Telephone Encounter (Signed)
Called and LMOMTCB x 2 for [place].

## 2017-02-23 NOTE — Telephone Encounter (Signed)
Return call from [place] from [place].Hillery Hunter

## 2017-02-23 NOTE — Telephone Encounter (Signed)
lmtcb x3 for [employer]

## 2017-02-24 NOTE — Telephone Encounter (Signed)
Please see phone note from 4.11.2018. Will sign off.

## 2017-02-24 NOTE — Telephone Encounter (Signed)
Hoyle Sauer returned phone call: states will be moving ahead and already contacted patient.. Ask for Hoyle Sauer in appeals at  [phone].Angela [name]

## 2017-02-24 NOTE — Telephone Encounter (Signed)
Spoke with Gwenlyn Fudge with [employer]- states that pt's appeal for Prolastin is being processed and we will be kept updated with further recs as they have them.  If we have any further questions/concerns we can call Hoyle Sauer at below number.   Forwarding to [place] as FYI.

## 2017-02-28 NOTE — Telephone Encounter (Signed)
Spoke with Heidi Dach with [employer] and was advised they are waiting to receive a new appeal, once they receive this then they will be able to initiate the appeal. Will await updates from [facility].

## 2017-03-04 ENCOUNTER — Telehealth: Payer: Self-pay | Admitting: Pulmonary Disease

## 2017-03-04 NOTE — Telephone Encounter (Signed)
Received call from Butch Penny in reference to prolastin prior authorization.  Was informed that medicare requires same day call to service regardless of whether on call provider would not have necessary information.  Informed Butch Penny that indeed I do not have the necessary information.  She thanked me for my time, and will call to office on Monday to get necessary information.

## 2017-03-08 DIAGNOSIS — S6992XA Unspecified injury of left wrist, hand and finger(s), initial encounter: Secondary | ICD-10-CM | POA: Diagnosis not present

## 2017-03-09 ENCOUNTER — Other Ambulatory Visit: Payer: Self-pay | Admitting: Family Medicine

## 2017-03-09 ENCOUNTER — Ambulatory Visit
Admission: RE | Admit: 2017-03-09 | Discharge: 2017-03-09 | Disposition: A | Discharge status: Home / Self Care | Payer: PPO | Source: Ambulatory Visit | Attending: Family Medicine | Admitting: Family Medicine

## 2017-03-09 DIAGNOSIS — S6992XA Unspecified injury of left wrist, hand and finger(s), initial encounter: Secondary | ICD-10-CM | POA: Diagnosis not present

## 2017-03-09 DIAGNOSIS — T1490XA Injury, unspecified, initial encounter: Secondary | ICD-10-CM

## 2017-03-10 NOTE — Telephone Encounter (Signed)
Routing to Elise for follow-up. 

## 2017-03-10 NOTE — Telephone Encounter (Signed)
Elise: did not understand this. Please follow  Thanks  Dr. Brand Males, M.D., [facility].C.P Pulmonary and Critical Care Medicine Staff Physician [place] [facility] Pager: [phone], If no answer or between  15:00h - 7:00h: call [phone]  03/10/2017 12:33 PM

## 2017-03-13 NOTE — Telephone Encounter (Signed)
Angela [name] please advise if anything further is needed on this appeal.  Thanks!

## 2017-03-14 ENCOUNTER — Telehealth: Payer: Self-pay | Admitting: Internal Medicine

## 2017-03-14 NOTE — Telephone Encounter (Signed)
Called and spoke with Angela [name]--  He stated that the PA from earlier in April was denied and that they did not receive the information back from the pt in time, so this PA was denied.    Angela [name] stated that there was another PA that was intiated for this pt and he stated that this information was sent over to MR.  He stated that they are needing new IgA levels and the appeal/denial information sent to them along with this form.  Fax # [phone]  Will forward to elsie to follow up on this form that is needed.  Please advise if you have this. Thanks

## 2017-03-14 NOTE — Telephone Encounter (Signed)
Will close message on pt but will keep message in my box to f/u on. Will update chart when I receive updates.

## 2017-03-17 NOTE — Telephone Encounter (Signed)
Will speak with Marlou Sa with [employer]. Please see phone note from 4.11.2018. Will sign off.

## 2017-03-20 DIAGNOSIS — M5021 Other cervical disc displacement,  high cervical region: Secondary | ICD-10-CM | POA: Diagnosis not present

## 2017-03-20 DIAGNOSIS — I10 Essential (primary) hypertension: Secondary | ICD-10-CM | POA: Diagnosis not present

## 2017-03-20 DIAGNOSIS — Z6832 Body mass index (BMI) 32.0-32.9, adult: Secondary | ICD-10-CM | POA: Diagnosis not present

## 2017-03-20 NOTE — Telephone Encounter (Signed)
There is a new encounter opened on pt. Will continue to document in new phone note, please phone note from 5.1.2018. Will sign off.

## 2017-03-20 NOTE — Telephone Encounter (Signed)
I have not received any information on pt's PA.

## 2017-03-21 NOTE — Telephone Encounter (Signed)
I have called and lmom for Nicole Kindred to refax the forms that need to be completed.  Will forward back to elise to follow up on.

## 2017-03-22 NOTE — Telephone Encounter (Signed)
Noted. Will sign off on message and create a new message if Nicole Kindred calls back.   Dean with [employer] is aware that we have not been receiving the appeals or PA's, the pt has. Marlou Sa has spoken with Nicole Kindred to inform him of this.

## 2017-03-22 NOTE — Telephone Encounter (Signed)
Spoke with Nicole Kindred who stated that insurance told him that they faxed something back in April for the information that is needed but Nicole Kindred states it looks like we keep falling out of the time frame to have things back. Nicole Kindred states some forms were sent to pt about initiating an appeal and the pt has sent him the information and he will see how he can do it on his end. He stated he will call us if he needs [place].

## 2017-03-31 ENCOUNTER — Telehealth: Payer: Self-pay | Admitting: Internal Medicine

## 2017-03-31 DIAGNOSIS — Z5181 Encounter for therapeutic drug level monitoring: Secondary | ICD-10-CM

## 2017-03-31 NOTE — Telephone Encounter (Signed)
Called and lmomtcb x 1 for the pt.  Need to make her aware of labs that are pending that she will need to come in for.  Thanks

## 2017-03-31 NOTE — Telephone Encounter (Signed)
Spoke with pt and she is aware that her labs are pending she will come Monday to get them drawn. She had no further questions at this time. Nothing further is needed

## 2017-03-31 NOTE — Telephone Encounter (Signed)
Spoke with [facility] with [employer], who states IgA is needed for appeal.  It does not appear that pt has had an IgA  drawn. I asked Hoyle Sauer if IgA is all that is needed or if  igG, IgA and IgM is needed. Hoyle Sauer states per her records it appears that only IgA is needed, however if MR wishes to order all three labs just in case that would be okay as well. Pt is aware that this lab is needed.   MR please advise. Thanks.

## 2017-03-31 NOTE — Telephone Encounter (Signed)
Patient returning call - she can be reached at [phone] -pr

## 2017-03-31 NOTE — Telephone Encounter (Signed)
Ok to order IgA - this is because in IgA definiciencies (rare) the antibody infusion can be problematic. Also, if > 9months since last LFT check on esbriet - check LFT too  Dr. Brand Males, M.D., [facility].C.P Pulmonary and Critical Care Medicine Staff Physician [place] [facility] Pager: [phone], If no answer or between  15:00h - 7:00h: call [phone]  03/31/2017 1:15 PM

## 2017-04-04 ENCOUNTER — Other Ambulatory Visit (INDEPENDENT_AMBULATORY_CARE_PROVIDER_SITE_OTHER): Payer: PPO

## 2017-04-04 DIAGNOSIS — M4803 Spinal stenosis, cervicothoracic region: Secondary | ICD-10-CM | POA: Diagnosis not present

## 2017-04-04 DIAGNOSIS — Z5181 Encounter for therapeutic drug level monitoring: Secondary | ICD-10-CM | POA: Diagnosis not present

## 2017-04-04 DIAGNOSIS — M5021 Other cervical disc displacement,  high cervical region: Secondary | ICD-10-CM | POA: Diagnosis not present

## 2017-04-04 LAB — HEPATIC FUNCTION PANEL
ALBUMIN: 4.4 g/dL (ref 3.5–5.2)
ALT: 15 U/L (ref 0–35)
AST: 16 U/L (ref 0–37)
Alkaline Phosphatase: 161 U/L — ABNORMAL HIGH (ref 39–117)
BILIRUBIN DIRECT: 0.2 mg/dL (ref 0.0–0.3)
TOTAL PROTEIN: 7.4 g/dL (ref 6.0–8.3)
Total Bilirubin: 0.5 mg/dL (ref 0.2–1.2)

## 2017-04-04 LAB — IGA: IgA: 127 mg/dL (ref 68–378)

## 2017-04-07 DIAGNOSIS — M5023 Other cervical disc displacement, cervicothoracic region: Secondary | ICD-10-CM | POA: Diagnosis not present

## 2017-04-07 DIAGNOSIS — I10 Essential (primary) hypertension: Secondary | ICD-10-CM | POA: Diagnosis not present

## 2017-04-07 DIAGNOSIS — Z6832 Body mass index (BMI) 32.0-32.9, adult: Secondary | ICD-10-CM | POA: Diagnosis not present

## 2017-04-07 DIAGNOSIS — M542 Cervicalgia: Secondary | ICD-10-CM | POA: Diagnosis not present

## 2017-04-11 DIAGNOSIS — G4733 Obstructive sleep apnea (adult) (pediatric): Secondary | ICD-10-CM | POA: Diagnosis not present

## 2017-04-13 ENCOUNTER — Telehealth: Payer: Self-pay | Admitting: Internal Medicine

## 2017-04-13 NOTE — Telephone Encounter (Signed)
Spoke with Hoyle Sauer at [employer], requesting most recent IGA level.  This has been faxed to [place] at [phone].  Nothing further needed.

## 2017-04-25 ENCOUNTER — Telehealth: Payer: Self-pay | Admitting: Internal Medicine

## 2017-04-25 NOTE — Telephone Encounter (Addendum)
Called [employer] at the number provided  Spoke with Hoyle Sauer  She states that Prolastin has been denied  She is filing an external appeal  She asked me to have any older PFT's on pt to her at [phone] and so I faxed the one from 2016 and then the one from April 2017  Nothing further needed at this time

## 2017-04-26 ENCOUNTER — Telehealth: Payer: Self-pay | Admitting: Internal Medicine

## 2017-04-26 NOTE — Telephone Encounter (Signed)
Keenan Bachelor with [employer], states that he spoke to the pt and she wants to see if we can try to get infusion at [facility] instead of at pt's home.  Per Nicole Kindred, pt is anxious to start infusions and wants to have them done wherever will be the easiest to get approved.    We would need to call [employer] at [phone] to see if they will approve this coverage through [facility] instead of home infusions to see if this is covered if MR is agreeable.   MR please advise if you'd like to try getting these infusions approved to be done in hospital outpatient setting.  Thanks.

## 2017-05-02 ENCOUNTER — Telehealth: Payer: Self-pay | Admitting: Internal Medicine

## 2017-05-02 NOTE — Telephone Encounter (Signed)
MR please advise once you are back in the office.  thanks

## 2017-05-03 DIAGNOSIS — Z79899 Other long term (current) drug therapy: Secondary | ICD-10-CM | POA: Diagnosis not present

## 2017-05-03 DIAGNOSIS — J22 Unspecified acute lower respiratory infection: Secondary | ICD-10-CM | POA: Diagnosis not present

## 2017-05-03 NOTE — Telephone Encounter (Signed)
Spoke with pt. States that she is not feeling well. Apologized for someone not calling her back yesterday. Reports cough, SOB, wheezing and chest tightness. Cough is producing yellow mucus. Denies fever, chills or sweats. Symptoms started over 1 week ago. Has not taken any OTC medications. Would like to have something sent in if possible.  MR - please advise. Thanks.

## 2017-05-03 NOTE — Telephone Encounter (Signed)
Patient is calling back, as she has not heard from [place] since this am and is sick. CB is [phone].

## 2017-05-03 NOTE — Telephone Encounter (Signed)
Pt is aware that we are going to have one of the other MDs address her message.

## 2017-05-03 NOTE — Telephone Encounter (Signed)
Attempted to contact pt. No answer, no option to leave a message. Will try back.   Message will be routed to DOD.

## 2017-05-03 NOTE — Telephone Encounter (Signed)
Pt returned phone call..ert ° ° °

## 2017-05-03 NOTE — Telephone Encounter (Signed)
Please send in a prescription for the following and she should be scheduled to be seen ASAP. If she starts to feel better significantly she can just keep her July appointment with MR.  1.  Prednisone 40mg  daily x 4 days 2.  Doxycycline 100mg  po bid x 7 days -  Remind her to take with a full glass of water and remain upright for 1 hour after taking it, avoid excessive sunlight, and do not take it with any dairy products.

## 2017-05-03 NOTE — Telephone Encounter (Signed)
Spoke with the pt and gave recs per JN  She states that she is currently being treated at at [facility] multiple times for the delay in [place] getting back to her  TD aware of this and plans to discuss with the pt herself or [facility] will call her

## 2017-05-04 NOTE — Telephone Encounter (Signed)
Let patient Angela [name] know that these are home infusions and doing in hospital just adds layer of complexity from insurance approvals to scheduling etc., Generally the prolastin folks do a great job in getting this at home and so far we have not seen problems. So my preference is to start at hoome  Dr. Brand Males, M.D., [facility].C.P Pulmonary and Critical Care Medicine Staff Physician [place] [facility] Pager: [phone], If no answer or between  15:00h - 7:00h: call [phone]  05/04/2017 8:55 AM

## 2017-05-04 NOTE — Telephone Encounter (Signed)
Called spoke with patient to discuss MR's recommendations Pt stated that she would like to have her Prolastin done at home but she is concerned with her Medicare Part D and Part B coverage.  She states right now her drug coverage copay is $8 but is concerned that having the infusion at home will move her into the 80/20 category and she is concerned about that cost.  She really just wants the cheapest option.  Can someone from [employer] or her insurance give her an exact cost/copay?  Can MR speak with someone to see if her A1AT diagnosis can be discussed further and that MZ can really be treated with the ZZ treatment?  MR please advise, thank you.

## 2017-05-09 NOTE — Telephone Encounter (Signed)
MR please advise. Thanks! 

## 2017-05-11 NOTE — Telephone Encounter (Signed)
I have spoken with Nicole Kindred with [employer], who is requesting that our office summit a PA for Prolastin to be done at a outpatient facility. Nicole Kindred is aware that MR is out of office today, and ask that we return his call the beginning of next week with an update.  MR please advise. Thanks.

## 2017-05-11 NOTE — Telephone Encounter (Signed)
MR please advise thanks  

## 2017-05-11 NOTE — Telephone Encounter (Signed)
Nicole Kindred w/[employer], [phone] ext [phone] is asking we call him.  States patient is asking if she can now be approved with Prolastin as out patient since in-home has been denied.  Nicole Kindred they just really need to know what to do now and what steps to take from here for the patient.

## 2017-05-12 NOTE — Telephone Encounter (Signed)
MR please advise about the PA for the prolastin.

## 2017-05-15 NOTE — Telephone Encounter (Signed)
Called number provided which brought me to [facility], was told I could not initiate PA over the phone, but was instructed to go to [email or web address] and print off a PA form. Form printed and filled out to best of my ability, and placed in MR's look-at for further completion.   Routing back to [place] for follow-up.

## 2017-05-15 NOTE — Telephone Encounter (Signed)
Called and spoke to pt. She is aware of [employer] working on her case, she has signed a release form for release of PHI and consents. Pt states she was told by Nicole Kindred that her insurance does not cover in home infusions (part D) but does cover outpatient infusions (part B). Pt aware this would be more expensive but is aware that she may get financial help through prolastin.   Triage - can you call insurance to initiate PA for Prolastin to be given in outpatient facility [facility] short stay). Phone number per Nicole Kindred is: [phone].

## 2017-05-16 ENCOUNTER — Telehealth: Payer: Self-pay | Admitting: Internal Medicine

## 2017-05-16 NOTE — Telephone Encounter (Signed)
Gave information to TD.

## 2017-05-16 NOTE — Telephone Encounter (Signed)
TD called and spoke with the pt and issues have been resolved. Nothing further is needed.

## 2017-05-18 NOTE — Telephone Encounter (Signed)
EE please advise of update when available.  Thanks

## 2017-05-18 NOTE — Telephone Encounter (Signed)
Per Daneil Dan form did not need MR's sig, form as well as supported docuemnts were faxed to [phone]. Will hold in [place] box to follow up on

## 2017-05-19 NOTE — Telephone Encounter (Signed)
Spoke with Georgina Snell with [employer], who request for PA with CPT codes to be re faxed to [phone]. PA has been re faxed with CPT codes.  Will route to [place] to f/u on.

## 2017-05-19 NOTE — Telephone Encounter (Signed)
Left a message for [facility].

## 2017-05-19 NOTE — Telephone Encounter (Signed)
Calling a/b forms faxed needing them refaxed and include a request form they can be reached @ [phone] [facility]).Angela [name]

## 2017-05-19 NOTE — Telephone Encounter (Signed)
Georgina Snell from [facility] called - received authorization form and he not sure what we want - he can be reached at [phone] -pr

## 2017-05-22 ENCOUNTER — Other Ambulatory Visit: Payer: Self-pay | Admitting: Cardiology

## 2017-05-22 MED ORDER — HYDROCHLOROTHIAZIDE 12.5 MG PO CAPS: 12.5000 mg | ORAL_CAPSULE | Freq: Every day | ORAL | 1 refills | Status: DC

## 2017-05-22 MED ORDER — METOPROLOL SUCCINATE ER 25 MG PO TB24: ORAL_TABLET | ORAL | 1 refills | Status: AC

## 2017-05-22 NOTE — Telephone Encounter (Signed)
[facility] says  They need a call back ASAP concerning  The info you've ben faxing over

## 2017-05-22 NOTE — Telephone Encounter (Signed)
Deana from [facility] calling back - she can be reached at [phone] - they go to lunch btwn 12-1 -pr

## 2017-05-22 NOTE — Telephone Encounter (Signed)
LMTCB on VM after being on hold for 3 min

## 2017-05-22 NOTE — Telephone Encounter (Signed)
Triage, can you call [facility] please.

## 2017-05-22 NOTE — Telephone Encounter (Signed)
lmtcb x2 for [facility].

## 2017-05-22 NOTE — Telephone Encounter (Signed)
lmtcb x1 for [facility].

## 2017-05-23 NOTE — Telephone Encounter (Signed)
I have spoken with Judeen Hammans with [employer], who wanted to clarify that the PA forms and documentation received on 05/19/17 were in additional to 05/15/17. I have explained to Judeen Hammans that we were made aware by [place] that CPT codes were needed, which is what was added to the forms faxed on 05/19/17. Judeen Hammans states there was some confusing due to the fax cover sheet faxed over on 05/15/17 saying ATTN to whomever in appeals department and ICD-10 being included which are typically needed for in home infusions. Judeen Hammans states she is going to go ahead and process PA.  Will send to [facility] to f/u on.

## 2017-05-25 NOTE — Telephone Encounter (Signed)
Spoke with [name] with [employer], calling for an update. [phone] States that he gave [place] CPT codes the last time he spoke with her but I do not see this documented in the telephone note so it must be documented on the actual form.  Daneil Dan do you have any update on this or any information that needs to be past along to [place] regarding the PA?

## 2017-05-26 ENCOUNTER — Telehealth: Payer: Self-pay | Admitting: Internal Medicine

## 2017-05-26 NOTE — Telephone Encounter (Signed)
Called pt and informed her of our denial letter but she states she got an approval letter. She will fax [place] her approval letter to take a look at. [place] fax number given, will await fax

## 2017-05-26 NOTE — Telephone Encounter (Signed)
MR  Received a denial letter from [place] for patient's . One of the reasons patient was denied was " The patients FEV1 level is greater than 60% with no indication of rapid decline in lung function"

## 2017-05-26 NOTE — Telephone Encounter (Signed)
Called and spoke with  Nicole Kindred with prolastin---he stated that he got a call from the pt today stating that she got an approval letter from medicare for the infusion for the prolastin.  He stated that she sent this to him and it looks like it is only for a one time visit to the infusion center---but this of course will have to be a weekly visit and they are trying  To get [facility].  He wanted to see if we have received the same information that the pt received.  Will forward to EE to follow up on message.

## 2017-05-26 NOTE — Telephone Encounter (Signed)
Spoke with patient. She stated that she received a letter for her prolastin infusions stating that they had been approved, but only one time. She is supposed to receive these infusions weekly, thus this "one" week plan will not work.   Per the patient, this letter came from [facility]. Attempted to call [employer] at [phone]. Phone rang numerous times and no one answered. Will attempt to call later.

## 2017-05-29 NOTE — Telephone Encounter (Signed)
Please see other phone note from 7.13.18. Will sign off.

## 2017-05-29 NOTE — Telephone Encounter (Signed)
Called [employer], line rang over 3 minutes with no answer.  Will call back.

## 2017-05-29 NOTE — Telephone Encounter (Signed)
I have checked up front and MR's look at. We have not received this fax yet. Will await fax.

## 2017-05-30 NOTE — Telephone Encounter (Signed)
Called [employer] at the [phone]....number provided in previous message, got a rep who told be I called the wrong number and provided me with [phone], and was transferred around several times before I reached someone who states she is unsure why they gave me the number for [facility] when this a [employer] issue, I was then provided phone number [phone] for someone at [employer]. Called the number first rep kept asking me for a J Code which I could not provide, she placed me on hold to see if she could have someone else to help me. Was placed on hold for a long period, will need to try to call back

## 2017-05-31 NOTE — Telephone Encounter (Signed)
I have checked MR's look at. This fax still has not been received. lmtcb x1 for pt.

## 2017-05-31 NOTE — Telephone Encounter (Signed)
Called [employer]. I attempted to get in touch with the pharmacy claims department. Every time I would chose this option, I would get a recording that stated, "Goodbye." I could not get anyone in any other department to be able to help me. Will try back.

## 2017-06-01 NOTE — Telephone Encounter (Signed)
Spoke with patient who states someone from [employer] called her and stated that what we are sending in keeps getting approved but it does not state that we want her infusions to be weekly. Informed patient we will look into this. Spoke with Daneil Dan who states that we should reach out to [place] at [employer] and find out if he has any additional information. Got his number from previous phone notes. He was not at his desk, left a vm to call back

## 2017-06-01 NOTE — Telephone Encounter (Signed)
A secondary appeal is being requested through [place] since this request has been denied already.  Form has been received and filled out to best of my ability.  Form is requesting a supporting statement be typed and faxed as well as this form- form placed in MR's look-at for signature.   Per this form, pt has been denied Prolastin d/t her FEV1 level being greater than 60% (77%) with no inclination of rapid decline in lung function.    MR please advise on what needs to be included in appeal letter.  Thanks!

## 2017-06-01 NOTE — Telephone Encounter (Signed)
Pt calling a/b the approval of medication  Said something only being approved for 2 shots? And can be reached @ [phone].Hillery Hunter

## 2017-06-05 NOTE — Telephone Encounter (Signed)
Called and spoke with pt and she stated that she has faxed in the approval letter that she received.  I have looked through MR look at papers and did not see this.  EE please advise if you have seen this.  Thanks

## 2017-06-05 NOTE — Telephone Encounter (Signed)
Spoke with Renelda Loma and was advised that the pt will need a letter send to insurance. MR please advise on the information below, we can use this as a template to send a letter to insurance.   ------------------------------------------------  In big bold letters at the top of your appeals letter please type...Marland KitchenMarland KitchenMarland Kitchen  "we request an expedited appeal"    You need to start the letter with notes from the patients charts...PFT numbers, chronic issues this patient has, medications they are currently on or have failed on, results from alpha-1 test..... anything that is relevant to their current condition. This is your chance to provide medical information specific to your patient and their condition.  Then add the following.....  Alpha-1 antitrypsin deficiency disease is an inherited genetic disorder. Prolastin-C is a life-sustaining IV infused biological product with FDA approval and has been used successfully since 1987 to treat this in curable genetic disease.  My request for Prolastin-C augmentation therapy is based on the following data with this patient population. This documented patient data is titled: "effects of a disease management program and patients with alpha-1 antitryspin deficiency" and was published in COPD: Journal of chronic obstruction pulmonary disease, February 2009. This outcome study produced documented results in alpha-1 patients receiving Prolastin-C augmentation therapy that include:  * 10% reduction in COPD exacerbations  * 10% increase in the use of long acting bronchodilators, translating into more optical medication use  * 12% reduction in unscheduled doctor visits  * 21% reduction in emergency room visits

## 2017-06-06 DIAGNOSIS — H04123 Dry eye syndrome of bilateral lacrimal glands: Secondary | ICD-10-CM | POA: Diagnosis not present

## 2017-06-06 DIAGNOSIS — E119 Type 2 diabetes mellitus without complications: Secondary | ICD-10-CM | POA: Diagnosis not present

## 2017-06-06 DIAGNOSIS — H18413 Arcus senilis, bilateral: Secondary | ICD-10-CM | POA: Diagnosis not present

## 2017-06-06 DIAGNOSIS — H11153 Pinguecula, bilateral: Secondary | ICD-10-CM | POA: Diagnosis not present

## 2017-06-06 DIAGNOSIS — Z961 Presence of intraocular lens: Secondary | ICD-10-CM | POA: Diagnosis not present

## 2017-06-06 DIAGNOSIS — H47232 Glaucomatous optic atrophy, left eye: Secondary | ICD-10-CM | POA: Diagnosis not present

## 2017-06-06 DIAGNOSIS — H401121 Primary open-angle glaucoma, left eye, mild stage: Secondary | ICD-10-CM | POA: Diagnosis not present

## 2017-06-06 DIAGNOSIS — H353131 Nonexudative age-related macular degeneration, bilateral, early dry stage: Secondary | ICD-10-CM | POA: Diagnosis not present

## 2017-06-08 ENCOUNTER — Ambulatory Visit (INDEPENDENT_AMBULATORY_CARE_PROVIDER_SITE_OTHER): Payer: PPO | Admitting: Internal Medicine

## 2017-06-08 ENCOUNTER — Encounter: Payer: Self-pay | Admitting: Internal Medicine

## 2017-06-08 VITALS — BP 152/78 | HR 62 | Ht 67.5 in | Wt 210.0 lb

## 2017-06-08 DIAGNOSIS — J84112 Idiopathic pulmonary fibrosis: Secondary | ICD-10-CM

## 2017-06-08 DIAGNOSIS — J8410 Pulmonary fibrosis, unspecified: Secondary | ICD-10-CM

## 2017-06-08 DIAGNOSIS — J439 Emphysema, unspecified: Secondary | ICD-10-CM

## 2017-06-08 DIAGNOSIS — E8801 Alpha-1-antitrypsin deficiency: Secondary | ICD-10-CM | POA: Diagnosis not present

## 2017-06-08 LAB — PULMONARY FUNCTION TEST
DL/VA % pred: 63 %
DL/VA: 3.32 ml/min/mmHg/L
DLCO COR % PRED: 46 %
DLCO cor: 13.43 ml/min/mmHg
DLCO unc % pred: 45 %
DLCO unc: 13.31 ml/min/mmHg
FEF 25-75 Pre: 1.44 L/sec
FEF2575-%Pred-Pre: 66 %
FEV1-%PRED-PRE: 74 %
FEV1-PRE: 1.97 L
FEV1FVC-%Pred-Pre: 98 %
FEV6-%Pred-Pre: 77 %
FEV6-Pre: 2.59 L
FEV6FVC-%PRED-PRE: 102 %
FVC-%PRED-PRE: 75 %
FVC-PRE: 2.63 L
Pre FEV1/FVC ratio: 75 %
Pre FEV6/FVC Ratio: 98 %

## 2017-06-08 NOTE — Addendum Note (Signed)
Addended by: Collier Salina on: 06/08/2017 03:02 PM   Modules accepted: Orders

## 2017-06-08 NOTE — Progress Notes (Signed)
PFT done today. 

## 2017-06-08 NOTE — Patient Instructions (Addendum)
ICD-10-CM   1. Pulmonary emphysema with fibrosis of lung ([place]) J43.9    J84.10   2. IPF (idiopathic pulmonary fibrosis) ([place]) J84.112   3. Pulmonary emphysema, unspecified emphysema type ([place]) J43.9   4. Alpha-1-antitrypsin deficiency ([place]) E88.01    Your lung function has declined 6% FVC  And 9% DLCO April 2017 -> July 2018 This is a significant decline and is due to IPF and emphysema getting worse   PLANN Get HRCT supine and prone - will call with results Respect your desire not to re-consider esbriet or considere Ofev for IPF due to its side effect Respect and appreciate your desire to participate in IPF trial - will refer you to [facility] for [facility] Trial Will try to special appeal and get you alpha  1 replacement through Medicare Part D Continue spiriva as before scheduled Use albuterol as needed Flu shot in fall Please talk to PCP Maurice Small, MD -  and ensure you get  shingarix vaccine   Followup 3 months do Pre-bd spiro and dlco only. No lung volume or bd response. No post-bd spiro 3 months with Dr Chase Caller

## 2017-06-08 NOTE — Progress Notes (Signed)
Subjective:     Patient ID: Angela [name], female   DOB: Dec 08, 1945, 71 y.o.   MRN: [id number]  HPI   OV 10/10/2016  Chief Complaint  Patient presents with  . Follow-up    Pt states overall her breathing has slightly improved since last OV. Pt states she has prod cough with yellow. Pt c/o midsternal CP 20 min after 2nd or 3rd dose of Esbriet.     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 This was also personally visualized. The main issues that were 3+3 times daily of Pirfenidone (Esbriet) she's having significant GI side effects of fatigue, nausea and bloated sensation and even chest pain after her dose. She's tried ginger she's had between meals. She has pacingg doses for several hours. All and communication with the help desk - but no improvement. She rates his symptoms is severe. Nevertheless because of the severity of pulmonary fibrosis and she is willing to continue with the medications although at a lower dose. She's not interested in switching over to  Ofev at this point in time. In the past she's not being in her about participating in the Pulm fibrosis foundation support group or research trials. She's not lost weight. Not yet had transplant discussion in detail. She is asking about alpha one replacement therapy because of the decline in lung function and also recurrent exacerbations with the antibiotic courses since last visit.     for Angela [name] (MRN [id number]) as of 10/10/2016 14:56  Ref. Range 08/28/2015 15:36 03/04/2016 12:53 08/16/2016 10:21  FVC-Pre Latest Units: L 2.81 2.79 2.69  FVC-%Pred-Pre Latest Units: % 79 79 76   Results for Angela [name] (MRN [id number]) as of 10/10/2016 14:56  Ref. Range 08/28/2015 15:36 03/04/2016 12:53 08/16/2016 10:21  DLCO unc Latest Units: ml/min/mmHg 15.56 14.76 14.10   DLCO unc % pred Latest Units: % 53 50 48   OV 10/10/2016  MR Chief Complaint  Patient presents with  . Follow-up    Pt states overall her breathing has slightly improved since last OV. Pt states she has prod cough with yellow. Pt c/o midsternal CP 20 min after 2nd or 3rd dose of Esbriet.   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 This was also personally visualized. The main issues that were 3+3 times daily of Pirfenidone (Esbriet) she's having significant GI side effects of fatigue, nausea and bloated sensation and even chest pain after her dose. She's tried ginger she's had between meals. She has pacingg doses for several hours. All and communication with the help desk - but no improvement. She rates his symptoms is severe. Nevertheless because of the severity of pulmonary fibrosis and she is willing to continue with the medications although at a lower dose. She's not interested in switching over to  Ofev at this point in time. In the past she's not being in her about participating in the Pulm fibrosis foundation support group or research trials. She's not lost weight. Not yet had transplant discussion in detail. She is asking about alpha one replacement therapy because of the decline in lung function and also recurrent exacerbations with the antibiotic courses since last visit.  rec  - start prolastin replacement  for MZ phenotype with decline lung function and repeated flare up> denied by insurance  - continue esbriet but reduce dose to to 2 pill three times daily - take with food and ginger    11/08/2016 acute extended ov/Wert re:  GOLD 0 copd/ MZ quit smoking 2013 / underlying ILD on perfenidone  Chief Complaint  Patient presents with  . Acute Visit    coughing so much it burns, used nebulizer to help, congestion  with clear/yellow mucus, headache  new cough that caused vomiting late 90s  zpak did not work/ doxy/pred  Worked last flare and lasted  4-5 weeks then acutely worse x one day prior to OV with severe coughing fits and midline cp brought on by coughing some better with neb/ ? spiriva dpi making it worse   No obvious day to day or daytime variability or assoc   mucus plugs or hemoptysis or cp or chest tightness, subjective wheeze or overt sinus or hb symptoms. No unusual exp hx or h/o childhood pna/ asthma or knowledge of premature birth.  Sleeping ok without nocturnal  or early am exacerbation  of respiratory  c/o's or need for noct saba. Also denies any obvious fluctuation of symptoms with weather or environmental changes or other aggravating or alleviating factors except as outlined above    OV 12/02/2016  Chief Complaint  Patient presents with  . Follow-up    Pt is here for 6 wk f/u. Pt fibrosis has remained unchanged, but has a question about the esbriet and prolastin    Follow-up idiopathic pulmonary fibrosis Emphysema MZ phenotype  Overall stable since last visit. Alpha-1 replacement has been approved but because of her  Ex-husband's death she has more money now and therefore her co-pay for both Prolastin and esbriet will increase. She's been worked [employer] to keep the co-pay cost down. If she does not succeed with this she will give up on both drugs. Prolastin has not been started yet. No new issues.   OV 06/08/2017  Chief Complaint  Patient presents with  . Follow-up    Pt states her breathing is unchanged since last OV. Pt c/o dry cough. Pt denies CP/tightness and f/c/s.      .Follow-up idiopathic pulmonary fibrosis with associated emphysema MZ phenotype with low levels of 75 mg/dL  She is here for routine follow-up. Last visit January 2018. Since then she's reporting progressive worsening of cough and shortness of breath particularly with significant exertion such as  climbing stairs or walking fast. In terms of IPF she is not interested in Pirfenidone [facility]) because of side effects that she had. She gave this up late last year early this year. She is also not interested in Ofev because of the diarrhea side effects. She prefers to be on basic supportive care and she is interested in pulmonary IPF research trials. In terms of emphysema MZ phenotype she is very interested in alpha-1 replacement therapy. The last 6 months. Been getting insurance denial for the same. She showed me that Medicare part D criteria which is based on low levels, progression and also low FEV1. She will not be a candidate for low FEV1 . Results for  because of mixed emphysema and fibrosis. I will need to do a special appealed for this. Otherwise no other changes in health status.  Angela [name], Angela [name] (MRN [id number]) as of 06/08/2017 09:37  Ref. Range 08/28/2015 16:37 03/04/2016 12:53 08/16/2016 10:21 06/08/2017 08:48  FVC-Pre Latest Units: L 2.81 2.79 2.69 2.63  FVC-%Pred-Pre Latest Units: % 79 79 76 75   Results for Angela [name], Angela [name] (MRN [id number]) as of 06/08/2017 09:37  Ref. Range 08/28/2015 16:37 03/04/2016 12:53 08/16/2016 10:21 06/08/2017 08:48  DLCO unc Latest Units: ml/min/mmHg 15.56 14.76 14.10 13.31  DLCO unc % pred Latest Units: % 53 50 48 45      has a past medical history of Alpha 1-antitrypsin PiMS phenotype; Anemia (2014); Chronic kidney disease; Complication of anesthesia; COPD (chronic obstructive pulmonary disease) ([place]); DDD (degenerative disc disease); Depression; Diabetes mellitus; Diverticulitis; Diverticulosis; Esophageal dysmotility (03/01/06); Fibromyalgia; Hypertension; Shingles; Shortness of breath dyspnea; Sleep apnea; and Stroke ([place]) (2010).   reports that she quit smoking about 5 years ago. Her smoking use included Cigarettes. She has a 22.00 pack-year smoking history. She has never used smokeless tobacco.  Past Surgical History:  Procedure Laterality Date  . ABDOMINAL  HYSTERECTOMY    . ANTERIOR FUSION CERVICAL SPINE    . APPENDECTOMY    . BACK SURGERY    . DE QUERVAIN'S RELEASE     right  . GASTRIC FUNDOPLICATION    . HERNIA REPAIR    . KNEE ARTHROSCOPY     left   . LUNG BIOPSY Left 03/23/2016   Procedure: LUNG BIOPSY;  Surgeon: Melrose Nakayama, MD;  Location: [facility] [place];  Service: Thoracic;  Laterality: Left;  . LYMPH NODE BIOPSY Left 03/23/2016   Procedure: LYMPH NODE BIOPSY;  Surgeon: Melrose Nakayama, MD;  Location: [place];  Service: Thoracic;  Laterality: Left;  . POSTERIOR CERVICAL FUSION/FORAMINOTOMY N/A 03/16/2015   Procedure: POSTERIOR CERVICAL LAMINECTOMY AND DISKECTOMOY AT CERVICAL SEVEN-THORACIC ONE ;POSTERIOR CERVICAL FUSION CERVICAL SIX TO THORACIC ONE;  Surgeon: Kary Kos, MD;  Location: [facility];  Service: Neurosurgery;  Laterality: N/A;  Site is Cervical/Thoracic  . TONSILLECTOMY    . VIDEO ASSISTED THORACOSCOPY Left 03/23/2016   Procedure: VIDEO ASSISTED THORACOSCOPY;  Surgeon: Melrose Nakayama, MD;  Location: [place];  Service: Thoracic;  Laterality: Left;    Allergies  Allergen Reactions  . Codeine Nausea And Vomiting  . Iohexol      Desc: Pt states several years ago during a CT scan w/ iv cotnrast she had severe nausea and vomiting, sob w/ difficulty breathing and swallowing.  She had full 13hr. premeds today (5/14/7) and did fine w/o complications.   . Tizanidine Other (See Comments)    Severe decrease in BP   . Ciprofloxacin Rash    Immunization History  Administered Date(s) Administered  . Pneumococcal-Unspecified 11/14/2012  . Tdap 09/05/2016    Family History  Problem Relation Age of Onset  . COPD Father   . Stomach cancer Maternal Grandfather   . Colon cancer Maternal Aunt      Current Outpatient Prescriptions:  .  acetaminophen (TYLENOL) 500 MG tablet, Take 2 tablets (1,000 mg total) by mouth every 6 (six) hours as needed for mild pain or fever., Disp: 30 tablet, Rfl: 0 .  albuterol (PROVENTIL) (2.5  MG/3ML) 0.083% nebulizer solution, INAHLE CONTENTS OF ONE VIAL VIA NEBULIZER EVERY 6 HOURS AS NEEDED FOR WHEEZING OR SHORTNESS OF BREATH, Disp: 900 mL, Rfl: 1 .  atorvastatin (LIPITOR) 40 MG tablet, Take 1 tablet by mouth daily., Disp: , Rfl: 11 .  B Complex-C (SUPER B-C PO), Take 1 tablet by mouth daily at 12 noon. , Disp: , Rfl:  .  buPROPion (WELLBUTRIN XL) 300 MG 24 hr tablet, Take 1 tablet by mouth every morning., Disp: , Rfl:  .  cyclobenzaprine (FLEXERIL)  10 MG tablet, Take 10 mg by mouth at bedtime as needed for muscle spasms. , Disp: , Rfl: 0 .  fluticasone (FLONASE) 50 MCG/ACT nasal spray, Place 2 sprays into both nostrils daily as needed for allergies. , Disp: , Rfl: 4 .  hydrochlorothiazide (MICROZIDE) 12.5 MG capsule, Take 1 capsule (12.5 mg total) by mouth daily., Disp: 90 capsule, Rfl: 1 .  losartan (COZAAR) 25 MG tablet, TAKE 1 TABLET(25 MG) BY MOUTH DAILY, Disp: 90 tablet, Rfl: 1 .  Menthol, Topical Analgesic, (BIOFREEZE) 4 % GEL, Apply 1 application topically 3 (three) times daily as needed (for back pain)., Disp: , Rfl:  .  metFORMIN (GLUCOPHAGE) 500 MG tablet, Take 500 mg by mouth 2 (two) times daily with a meal., Disp: , Rfl:  .  metoprolol succinate (TOPROL-XL) 25 MG 24 hr tablet, TAKE 1 TABLET(25 MG) BY MOUTH DAILY, Disp: 90 tablet, Rfl: 1 .  Multiple Vitamin (MULTIVITAMIN) capsule, Take 1 capsule by mouth daily., Disp: , Rfl:  .  ondansetron (ZOFRAN ODT) 4 MG disintegrating tablet, Take 1 tablet (4 mg total) by mouth every 8 (eight) hours as needed for nausea or vomiting., Disp: 10 tablet, Rfl: 0 .  Pirfenidone (ESBRIET) 267 MG CAPS, Take 3 capsules by mouth 3 (three) times daily., Disp: 270 capsule, Rfl: 11 .  promethazine (PHENERGAN) 25 MG tablet, Take 25 mg by mouth every 6 (six) hours as needed for nausea or vomiting. Reported on 04/12/2016, Disp: , Rfl:  .  sertraline (ZOLOFT) 50 MG tablet, Take 50 mg by mouth daily., Disp: , Rfl:  .  Tiotropium Bromide Monohydrate  (SPIRIVA RESPIMAT) 1.25 MCG/ACT AERS, Inhale 2 puffs into the lungs daily., Disp: 1 Inhaler, Rfl: 3 .  traMADol (ULTRAM) 50 MG tablet, Take 1-2 tablets (50-100 mg total) by mouth every 6 (six) hours as needed (mild pain)., Disp: 30 tablet, Rfl: 0 .  vitamin B-12 (CYANOCOBALAMIN) 500 MCG tablet, Take 500 mcg by mouth daily., Disp: , Rfl:    Review of Systems     Objective:   Physical Exam  Constitutional: She is oriented to person, place, and time. She appears well-developed and well-nourished. No distress.  HENT:  Head: Normocephalic and atraumatic.  Right Ear: External ear normal.  Left Ear: External ear normal.  Mouth/Throat: Oropharynx is clear and moist. No oropharyngeal exudate.  Eyes: Pupils are equal, round, and reactive to light. Conjunctivae and EOM are normal. Right eye exhibits no discharge. Left eye exhibits no discharge. No scleral icterus.  Neck: Normal range of motion. Neck supple. No JVD present. No tracheal deviation present. No thyromegaly present.  Cardiovascular: Normal rate, regular rhythm, normal heart sounds and intact distal pulses.  Exam reveals no gallop and no friction rub.   No murmur heard. Pulmonary/Chest: Effort normal. No respiratory distress. She has no wheezes. She has rales. She exhibits no tenderness.  Abdominal: Soft. Bowel sounds are normal. She exhibits no distension and no mass. There is no tenderness. There is no rebound and no guarding.  Visceral obesity  Musculoskeletal: Normal range of motion. She exhibits no edema or tenderness.  Lymphadenopathy:    She has no cervical adenopathy.  Neurological: She is alert and oriented to person, place, and time. She has normal reflexes. No cranial nerve deficit. She exhibits normal muscle tone. Coordination normal.  Skin: Skin is warm and dry. No rash noted. She is not diaphoretic. No erythema. No pallor.  Psychiatric: She has a normal mood and affect. Her behavior is normal. Judgment and thought  content  normal.  Vitals reviewed.    Vitals:   06/08/17 0926  BP: (!) 152/78  Pulse: 62  SpO2: 96%  Weight: 210 lb (95.3 kg)  Height: 5' 7.5" (1.715 m)   Estimated body mass index is 32.41 kg/m as calculated from the following:   Height as of this encounter: 5' 7.5" (1.715 m).   Weight as of this encounter: 210 lb (95.3 kg).       Assessment:       ICD-10-CM   1. Pulmonary emphysema with fibrosis of lung ([place]) J43.9    J84.10   2. IPF (idiopathic pulmonary fibrosis) ([place]) J84.112   3. Pulmonary emphysema, unspecified emphysema type ([place]) J43.9   4. Alpha-1-antitrypsin deficiency ([place]) E88.01        Plan:      Your lung function has declined 6% FVC  And 9% DLCO April 2017 -> July 2018 This is a significant decline and is due to IPF and emphysema getting worse   PLANN Get HRCT supine and prone - will call with results Respect your desire not to re-consider esbriet or considere Ofev for IPF due to its side effect Respect and appreciate your desire to participate in IPF trial - will refer you to [facility] for [facility] Trial Will try to special appeal and get you alpha  1 replacement through Medicare Part D Continue spiriva as before scheduled Use albuterol as needed Flu shot in fall Please talk to PCP Maurice Small, MD -  and ensure you get  shingarix vaccine   Followup 3 months do Pre-bd spiro and dlco only. No lung volume or bd response. No post-bd spiro 3 months with Dr Chase Caller    > 50% of this > 40 min visit spent in face to face counseling or/and coordination of care  Includes doing letter 06/08/2017    Dr. Brand Males, M.D., [facility].C.P Pulmonary and Critical Care Medicine Staff Physician [place] [facility] Pager: [phone], If no answer or between  15:00h - 7:00h: call [phone]  06/08/2017 10:02 AM

## 2017-06-12 IMAGING — CR DG CHEST 2V
2 series · 2 of 2 positions shown · non-contrast
Comparison: Chest CT 12/15/2015

CLINICAL DATA: Interstitial lung disease.  Left lung nodule.

EXAM:
CHEST  2 VIEW

[w chest pa]
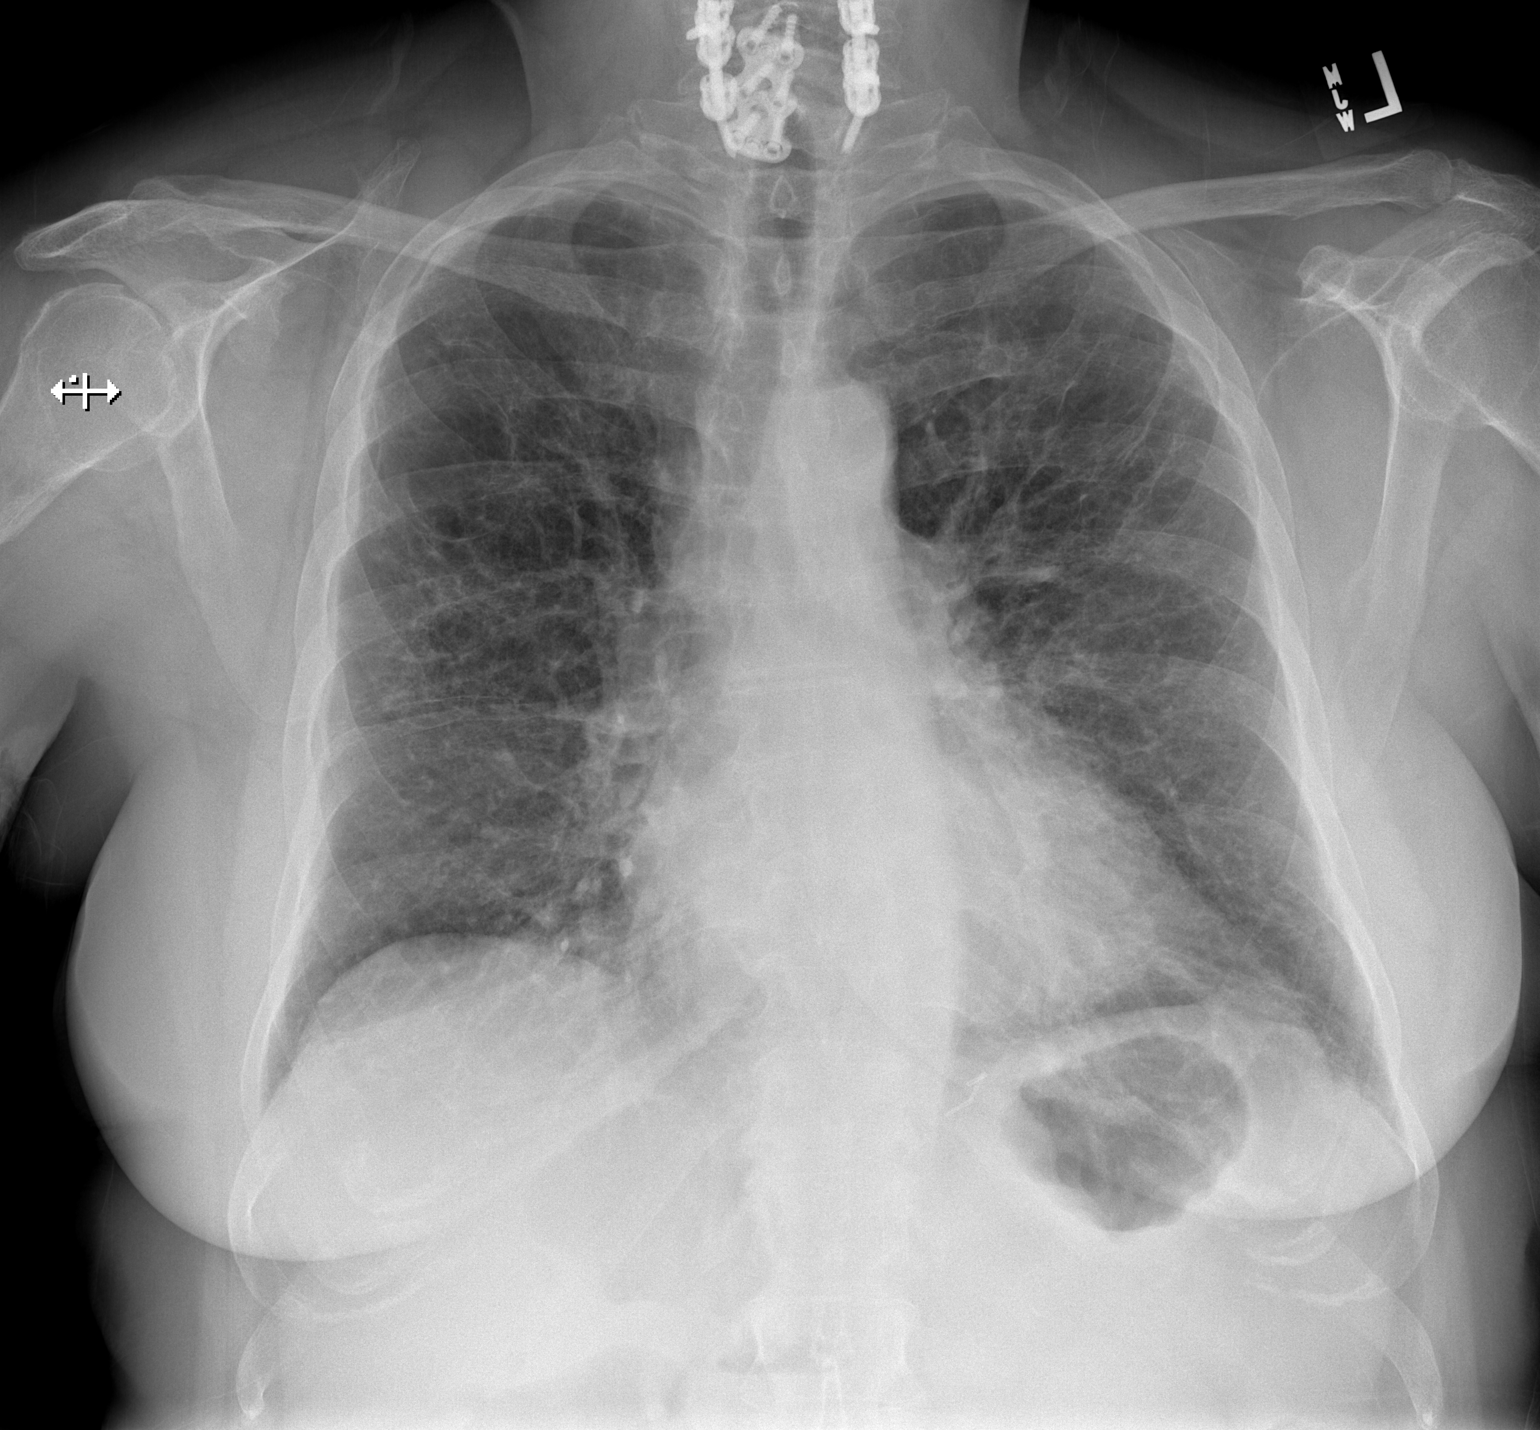

[w chest lat]
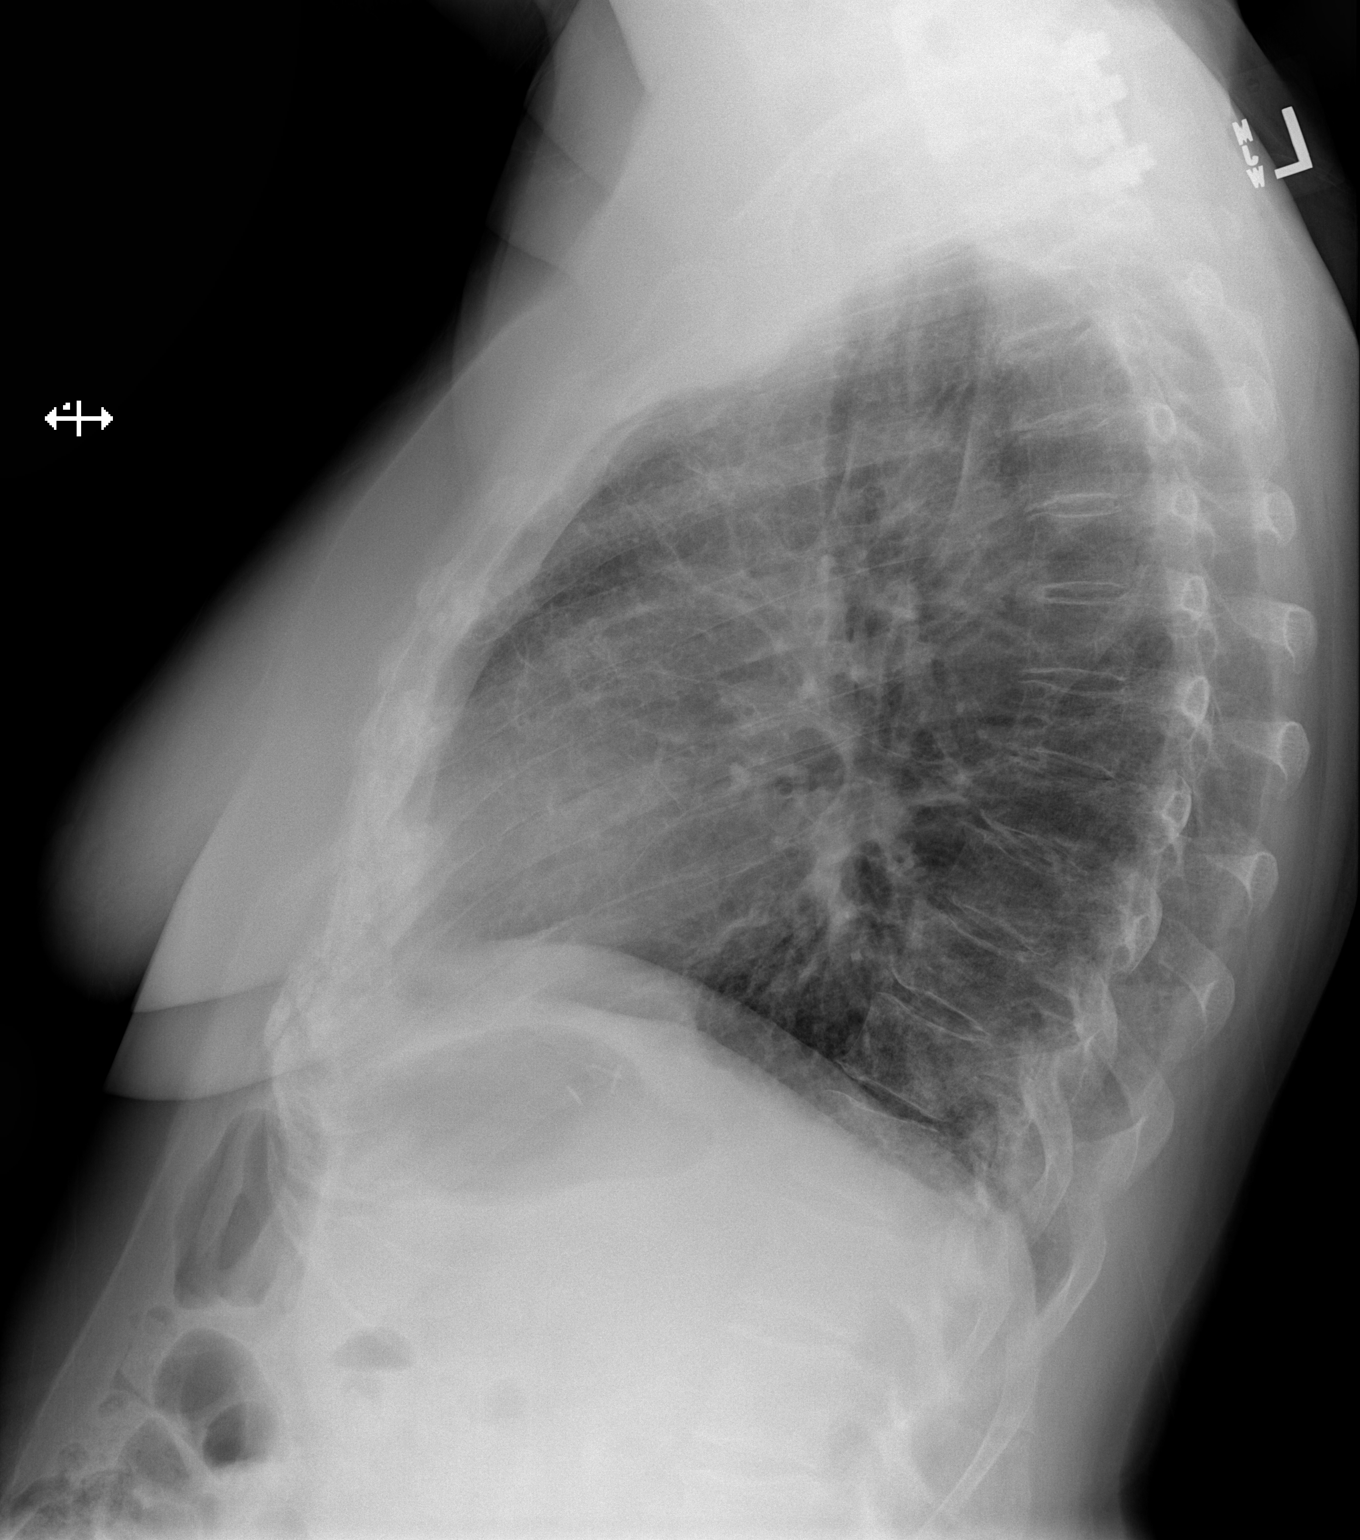

[2 of 2 positions shown; findings below may reference images not displayed]

FINDINGS: Two views of the chest demonstrate normal lung volumes. Again noted
are prominent interstitial lung markings that have not significantly
changed. Heart and mediastinum are within normal limits.
Postsurgical changes in the lower cervical spine. There is no focal
airspace disease or large areas of consolidation. No large pleural
effusions. The sub cm left pulmonary nodules seen on the prior chest
CT is too small to be evaluated on this examination. No acute bone
abnormality.
IMPRESSION: Chronic lung changes without acute findings.

## 2017-06-12 NOTE — Telephone Encounter (Signed)
Spoke with Angela [name] who reported that MR is contacting another physician for recommendations on how to proceed

## 2017-06-13 IMAGING — CT CT CHEST W/O CM
3 of 4 series · 17 of 30 positions shown, 19 images · non-contrast
Comparison: 12/15/2015

CLINICAL DATA: Preop assessment of lung nodule before open biopsy
of interstitial lung disease.

EXAM:
CT CHEST WITHOUT CONTRAST
TECHNIQUE: Multidetector CT imaging of the chest was performed following the
standard protocol without IV contrast.

[Series 3: chest w/o · axial · non-contrast · 0.70mm/px · z∈[-266,-40]mm · 7 of 121 slices shown, 9 images]
[im 16/121  mediastinal]
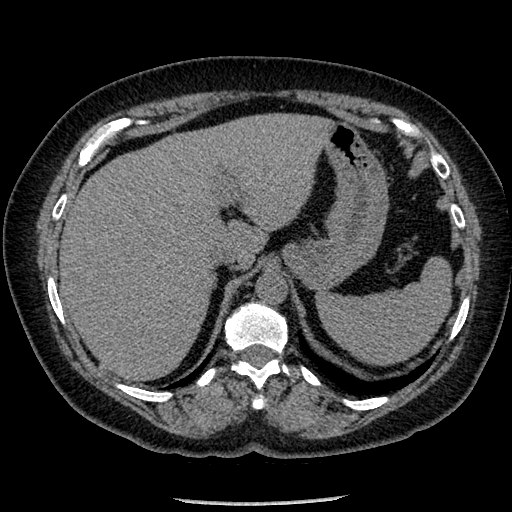
[im 16/121  lung]
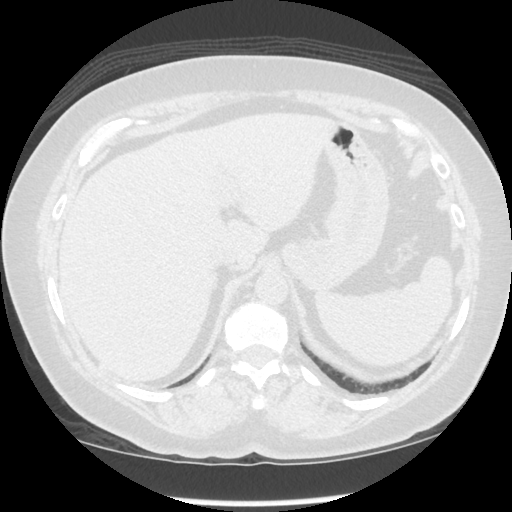
[im 31/121  lung]
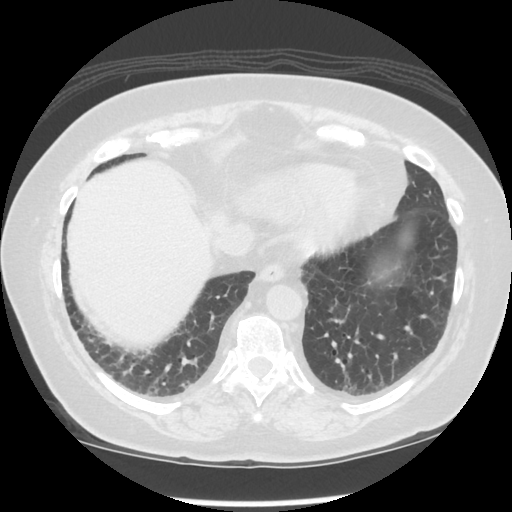
[im 46/121  lung]
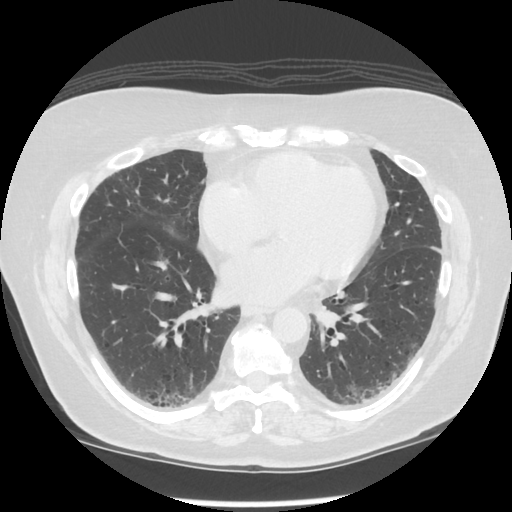
[im 61/121  lung]
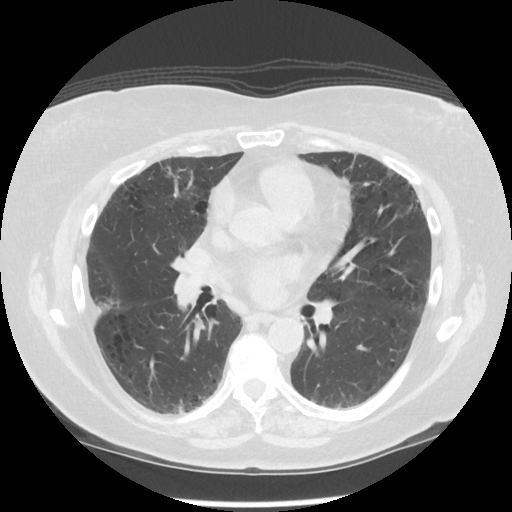
[im 76/121  mediastinal]
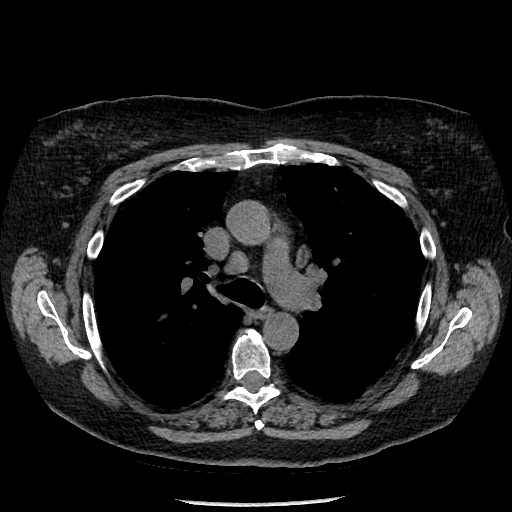
[im 76/121  lung]
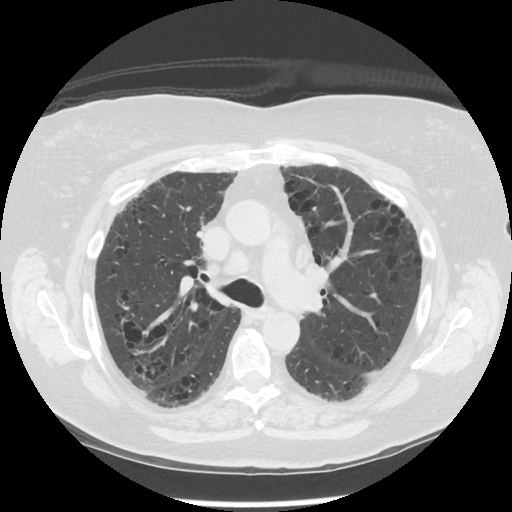
[im 91/121  lung]
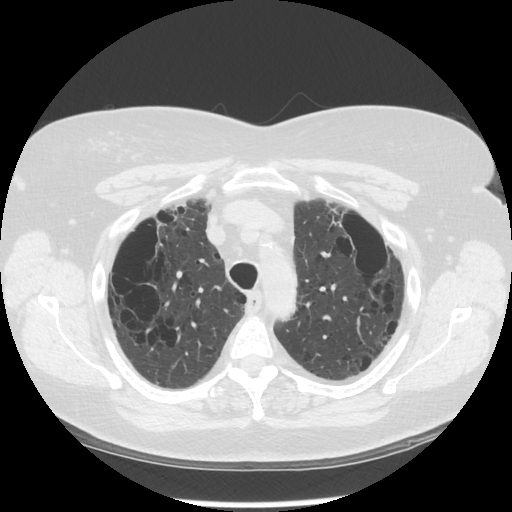
[im 106/121  lung]
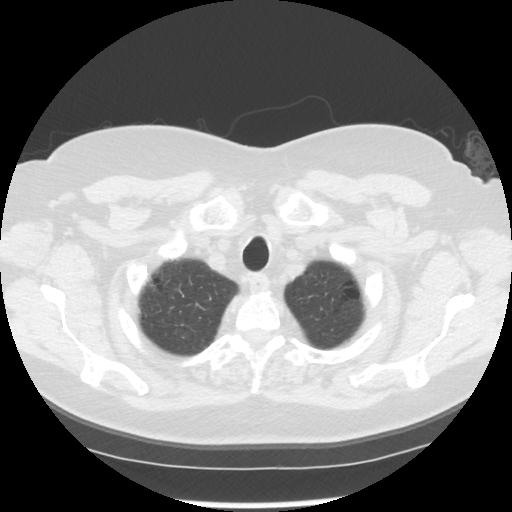

[Series 4: lung windows · axial · 0.70mm/px · z∈[-266,-40]mm · 7 of 121 slices shown]
[im 16/121  lung]
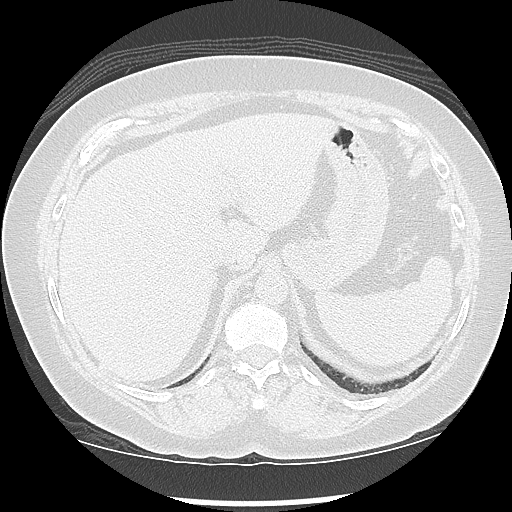
[im 31/121  lung]
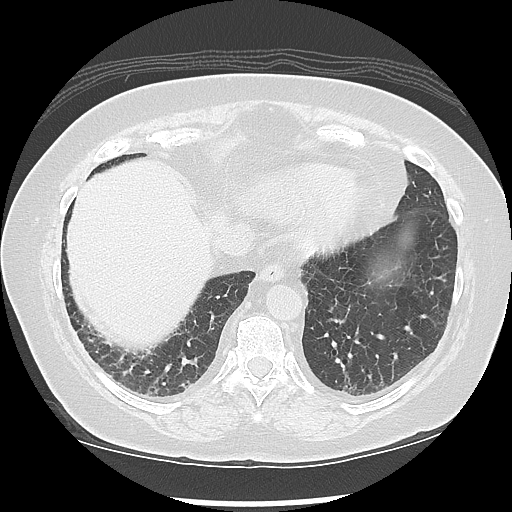
[im 46/121  lung]
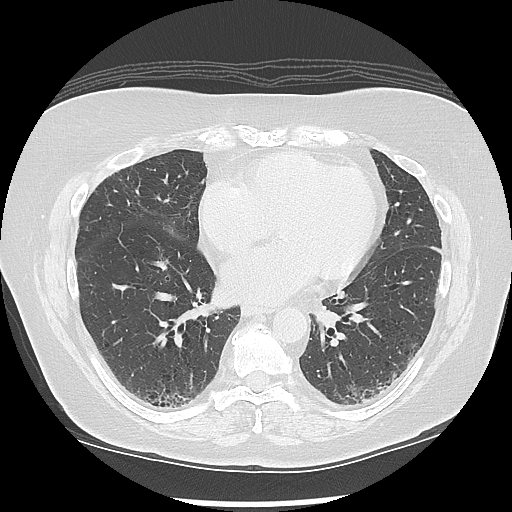
[im 61/121  lung]
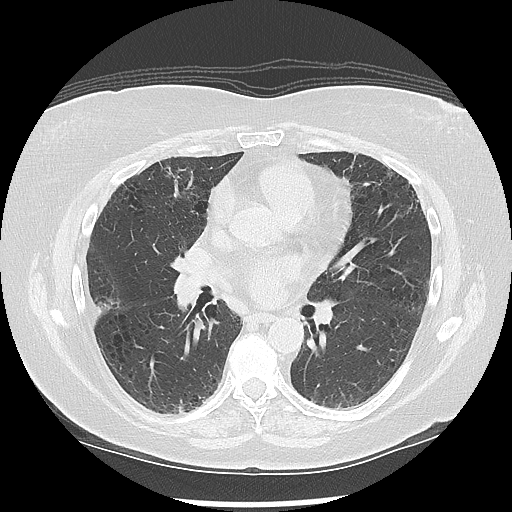
[im 76/121  lung]
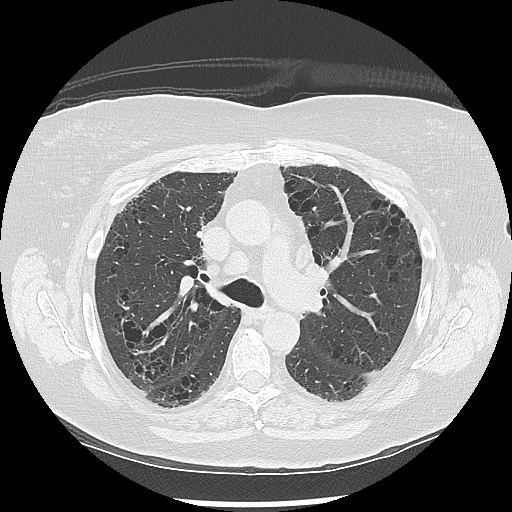
[im 91/121  lung]
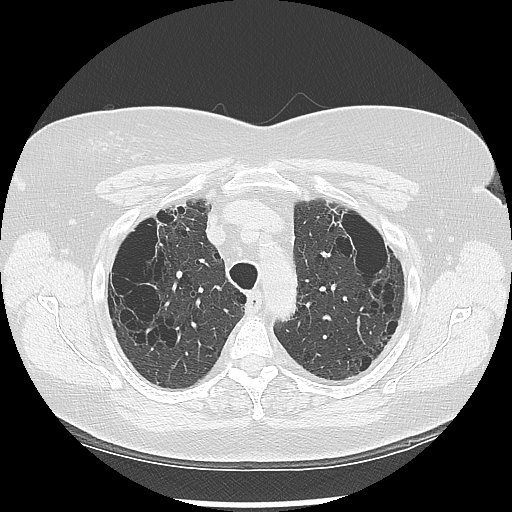
[im 106/121  lung]
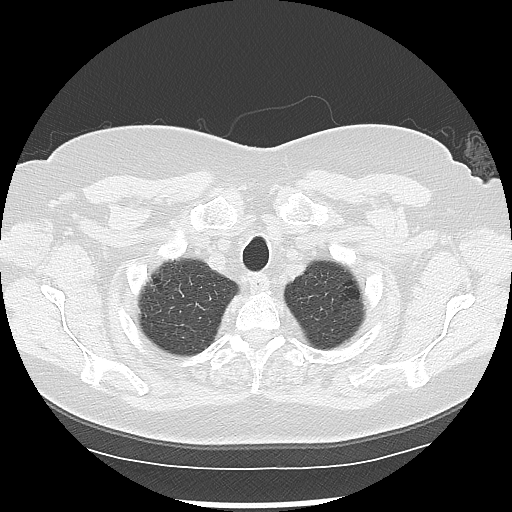

[Series 602: sagittal body · sagittal · 0.70mm/px · 3 of 145 slices shown]
[im 17/145  mediastinal]
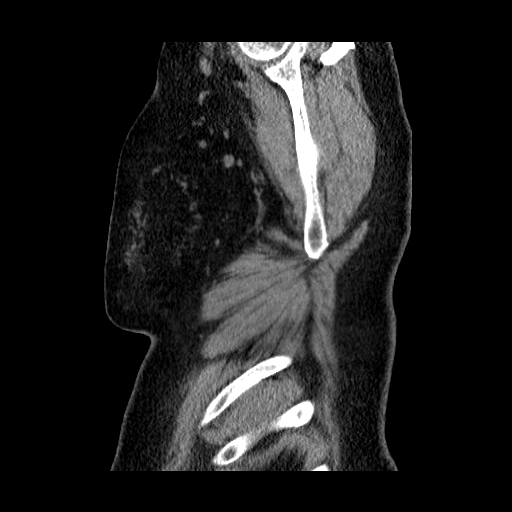
[im 33/145  mediastinal]
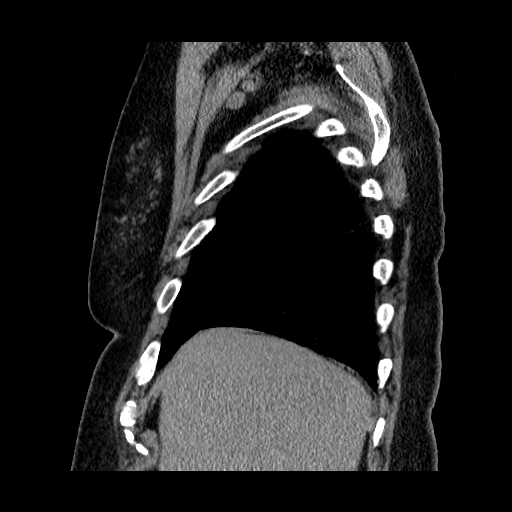
[im 49/145  mediastinal]
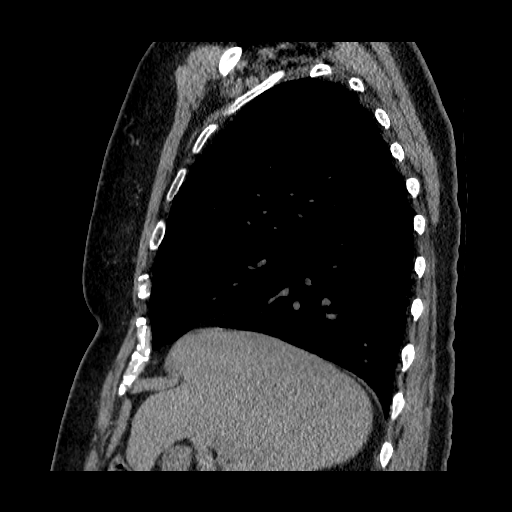

[17 of 30 positions shown; findings below may reference images not displayed]

FINDINGS: THORACIC INLET/BODY WALL:

No acute abnormality.

MEDIASTINUM:

Normal heart size. No pericardial effusion. No acute vascular
abnormality. Paratracheal, subcarinal, AP window lymph nodes have
decreased in size and are normal diameter today. Decreased size of
enlarged right hilar lymph nodes, 14 mm short axis on the right
today.

LUNG WINDOWS:

Emphysema and interstitial lung disease with honeycombing as
recently characterized by high-resolution CT. There is a 5 x 4 mm (5
mm mean diameter) pulmonary nodule re- identified along the lower
left major fissure ([DATE]) that is stable from prior. Triangular
morphology and location on sagittal reformats suggests lymph node.

UPPER ABDOMEN:

Changes of Nissen fundoplication

OSSEOUS:

No acute fracture.  No suspicious lytic or blastic lesions.
IMPRESSION: 1. The subpleural left lower lobe pulmonary nodule remains stable at
5 to 6 mm. Morphology suggests subpleural lymph node.
2. Mediastinal and hilar lymph nodes have decreased in size since
prior, many now normal diameter, suggesting improved inflammatory
adenopathy.
3. Interstitial lung disease without progression since prior.

## 2017-06-13 NOTE — Telephone Encounter (Signed)
Letter printed and appeal faxed to [place].  Will route back to [place] for follow-up.

## 2017-06-13 NOTE — Telephone Encounter (Signed)
Letter  To Whom It May Concerns Appeals Department, [name]   Re: [name] June 20, 1946 - prolastin approval  I am the treating physician for Angela [name] with date of birth 1945/12/12 and age 71 y.o. female. I am sincerely requesting you approve alpha 1 anti-trypsin replacement therapy for her (Prolastin-C). [name]'s situation is unusual.. Her smoking history is that she has 22 pack smoking history but quit 5 years ago. 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  She has normal IgA (04/04/2017). She has been under my care since atleast September 2016 for both emphysema and pulmonary fibrosis. The specific variety of pulmonary fibrosis was confirmed as IPF through surgical lung biopsy 03/23/2016. She has tried Pirfenidone [employer]) for the IPF and failed (2017) due to significant GI intolerance. She is therefore unwilling to switch to other anti-fibrotic agent Nintedanib because that too has significant GI side effects. In interim, she is having progressive decline in lung function (see below). Due to the combination of emphysema and fibrosis the decline is largely reflected in DLCO thought occasionally in FVC. By the time FeV1 declines the disease progression is very severe. This is because the combination of obstruction and restrictive lung disease normalize the FVC and FeV1 but make the DLCO signfiicantly reduced. So, at this point [name] is having progressive loss of lung function due to her mixed IPF and Alpha 1 MZ with low level emphysema. She is intolerant to the anti-fibrotic therapy for the IPF piece. Therefore, in my judgement Proloastin-C is one main line of defense in preserving her lung function.  Therefore, I request approval  Please do not hesitate to contact me if you have any questions  Sincerely yours  Dr. Brand Males, M.D., [facility].C.P Pulmonary and Critical Care Medicine Staff  Phys[facility] Pager: [phone], If no answer or between  15:00h - 7:00h: call [phone]  06/13/2017 1:27 AM  PS:  Results for [name], [name] (MRN [id number]) as of 06/13/2017 01:15  Ref. Range 08/28/2015 16:37 03/04/2016 12:53 08/16/2016 10:21 06/08/2017 08:48  FVC-Pre Latest Units: L 2.81 2.79 2.69 2.63  FVC-%Pred-Pre Latest Units: % 79 79 76 75  FEV1-Pre Latest Units: L 2.10 2.10 2.11 1.97  FEV1-%Pred-Pre Latest Units: % 77 78 79 74  Pre FEV1/FVC ratio Latest Units: % 75 75 79 75  Results for [name], [name] (MRN [id number]) as of 06/13/2017 01:15  Ref. Range 08/28/2015 16:37 03/04/2016 12:53 08/16/2016 10:21 06/08/2017 08:48  DLCO unc Latest Units: ml/min/mmHg 15.56 14.76 14.10 13.31  DLCO unc % pred Latest Units: % 53 50 48 45

## 2017-06-19 NOTE — Telephone Encounter (Signed)
Please see phone note from 7.13.2018. Will sign off.

## 2017-06-21 NOTE — Telephone Encounter (Signed)
Angela [name] please advise if the message can be closed. Thanks.

## 2017-06-22 ENCOUNTER — Ambulatory Visit (INDEPENDENT_AMBULATORY_CARE_PROVIDER_SITE_OTHER)
Admission: RE | Admit: 2017-06-22 | Discharge: 2017-06-22 | Disposition: A | Discharge status: Home / Self Care | Payer: PPO | Source: Ambulatory Visit | Attending: Internal Medicine | Admitting: Internal Medicine

## 2017-06-22 DIAGNOSIS — J84112 Idiopathic pulmonary fibrosis: Secondary | ICD-10-CM | POA: Diagnosis not present

## 2017-06-22 DIAGNOSIS — J8410 Pulmonary fibrosis, unspecified: Secondary | ICD-10-CM

## 2017-06-22 DIAGNOSIS — J439 Emphysema, unspecified: Secondary | ICD-10-CM | POA: Diagnosis not present

## 2017-06-22 DIAGNOSIS — J432 Centrilobular emphysema: Secondary | ICD-10-CM | POA: Diagnosis not present

## 2017-06-22 DIAGNOSIS — E8801 Alpha-1-antitrypsin deficiency: Secondary | ICD-10-CM

## 2017-06-26 NOTE — Telephone Encounter (Signed)
Discussed with Daneil Dan who reported Heidi Dach (rep for Prolastin) came to the office on Friday 8.10.18 and reported that 'they' received an approval for pt's Prolastin but this office has yet to receive such notification.  Will route back to [place] for follow up.

## 2017-06-27 DIAGNOSIS — M542 Cervicalgia: Secondary | ICD-10-CM | POA: Diagnosis not present

## 2017-06-27 NOTE — Telephone Encounter (Signed)
Per Marlou Sa, Prolastin is contacting the infusion center with [facility] and scheduling an infusion date and time. Will sign off but hold in my box to f/u on.

## 2017-06-27 NOTE — Telephone Encounter (Signed)
Checked fax machines in triage and up front, did not see a letter stating that this had been approved.

## 2017-06-28 ENCOUNTER — Telehealth: Payer: Self-pay | Admitting: Internal Medicine

## 2017-06-28 MED ORDER — PREDNISONE 10 MG PO TABS: ORAL_TABLET | ORAL | 0 refills | Status: DC

## 2017-06-28 NOTE — Telephone Encounter (Signed)
Prednisone 10 mg take  4 each am x 2 days,   2 each am x 2 days,  1 each am x 2 days and stop  

## 2017-06-28 NOTE — Telephone Encounter (Signed)
Spoke with patient. She is aware of MW's recs. Meds have been to her pharmacy. Nothing else was needed at time of call.

## 2017-06-28 NOTE — Telephone Encounter (Signed)
Spoke with patient. She was wondering about her Prolastin infusions. I advised the patient per the last update, the infusion center should be contacting her shortly. She verbalized understanding.   She also stated that she has been coughing and wheezing a lot since Sunday. She has a productive cough with clear phlegm. She has some chills. She wants to know what she should do.   MW, since [facility] is not the office, please advise. Thanks!

## 2017-06-30 ENCOUNTER — Telehealth: Payer: Self-pay | Admitting: Internal Medicine

## 2017-06-30 NOTE — Telephone Encounter (Signed)
Called and spoke with Nicole Kindred at [facility] and he stated that the pt did not qualify to have her first infusion done at home, so she will have to have the 1st infusion done at [facility] Short stay.  Nicole Kindred stated that he was informed that our office has this form that needs to be faxed over to them so they may set this up.  EE please advise thanks

## 2017-06-30 NOTE — Telephone Encounter (Signed)
This is the infusion form for short stay. This has been completed and faxed to [place] [facility] [place] at [facility].

## 2017-07-04 NOTE — Telephone Encounter (Signed)
EE do you know if the pt called short stay to set up the infusion?  Short stay will not call the pt.  Can we close this message?   Thanks

## 2017-07-04 NOTE — Telephone Encounter (Signed)
Pt is set up with short stay with [facility] on 8/28 for her Prolastin infusion.   Will forward to [facility] as FYI.

## 2017-07-04 NOTE — Telephone Encounter (Signed)
Please see phone note from 8.17.2018. Will sign off.

## 2017-07-10 ENCOUNTER — Other Ambulatory Visit (HOSPITAL_COMMUNITY): Payer: Self-pay | Admitting: *Deleted

## 2017-07-11 ENCOUNTER — Ambulatory Visit (HOSPITAL_COMMUNITY)
Admission: RE | Admit: 2017-07-11 | Discharge: 2017-07-11 | Disposition: A | Discharge status: Home / Self Care | Payer: PPO | Source: Ambulatory Visit | Attending: Internal Medicine | Admitting: Internal Medicine

## 2017-07-11 DIAGNOSIS — J439 Emphysema, unspecified: Secondary | ICD-10-CM | POA: Diagnosis not present

## 2017-07-11 MED ORDER — STERILE WATER FOR INJECTION IJ SOLN
6164.0000 mg | Status: DC
  Administered 2017-07-11: 10:00:00 6164 mg via INTRAVENOUS
  Filled 2017-07-11: qty 6164

## 2017-07-11 MED ORDER — ALPHA1-PROTEINASE INHIBITOR 1000 MG IV SOLR
60.0000 mg/kg | INTRAVENOUS | Status: DC
  Filled 2017-07-11 (×2): qty 6000

## 2017-07-11 NOTE — Discharge Instructions (Signed)
Alpha-1-proteinase Inhibitor Injection °What is this medicine? °ALPHA-1-PROTEINASE INHIBITOR (AL fa - 1 PRO tee nase in HIB i tor) is a drug that is used to replace an enzyme in patients with lung problems caused by low levels of alpha-1 antitrypsin (ATA). It is not a cure. °This medicine may be used for other purposes; ask your health care provider or pharmacist if you have questions. °COMMON BRAND NAME(S): Aralast, Aralast NP, Glassia, Prolastin, Prolastin C, Zemaira °What should I tell my health care provider before I take this medicine? °They need to know if you have any of these conditions: °-IgA (immunoglobulin A) deficiency °-an unusual or allergic reaction to alpha-1-proteinase inhibitor, other medicines, foods, dyes, or preservatives °-pregnant or trying to get pregnant °-breast-feeding °How should I use this medicine? °This medicine is for infusion into a vein. Your healthcare professional will decide if infusion in your home is right for you. You should be trained on how to do infusions by your healthcare professional. Follow the directions on the prescription label. Do not use your medicine more often than directed. °It is important that you put your used needles and supplies in a special sharps container. Do not put them in a trash can. If you do not have a sharps container, call your pharmacist or healthcare provider to get one. °Talk to your pediatrician regarding the use of this medicine in children. Special care may be needed. °Overdosage: If you think you have taken too much of this medicine contact a poison control center or emergency room at once. °NOTE: This medicine is only for you. Do not share this medicine with others. °What if I miss a dose? °This medicine is used once a week. It is important not to miss your dose. Call your health care professional if you are unable to keep an appointment. If you use this medicine at home and you miss a dose, use it as soon as you can and get back to a  weekly schedule. Call your healthcare professional if you are not sure what to do about a missed dose. °What may interact with this medicine? °Interactions are not expected. °This list may not describe all possible interactions. Give your health care provider a list of all the medicines, herbs, non-prescription drugs, or dietary supplements you use. Also tell them if you smoke, drink alcohol, or use illegal drugs. Some items may interact with your medicine. °What should I watch for while using this medicine? °Tell your doctor or healthcare professional if your symptoms do not start to get better or if they get worse. Your condition will be monitored carefully while you are receiving this medicine. °This medicine can cause serious allergic reactions. Ask your doctor or health care professional about an epinephrine pen and/or other supportive care for certain severe allergic reactions. °This medicine is made from human plasma, and there is a small risk that it may contain certain types of viruses or bacteria. All products are processed to kill most viruses and bacteria. If you have questions concerning the risk of infections, discuss them with your doctor or health care professional. °What side effects may I notice from receiving this medicine? °Side effects that you should report to your doctor or health care professional as soon as possible: °-allergic reactions like skin rash, itching or hives, swelling of the face, lips, or tongue °-breathing problems °-chest pain, tightness °-feeling faint or lightheaded, falls °-signs and symptoms of infection like fever or chills; cough; sore throat; pain or trouble passing urine °-swelling   in your legs or feet °-unusually weak or tired °Side effects that usually do not require medical attention (report to your doctor or health care professional if they continue or are bothersome): °-diarrhea °-dizziness °-headache °-hot flashes or flushing °-joint or muscle  pain °-nausea °-pain, redness, or irritation at site where injected °-runny or stuffy nose °-tiredness °This list may not describe all possible side effects. Call your doctor for medical advice about side effects. You may report side effects to FDA at 1-800-FDA-1088. °Where should I keep my medicine? °Keep out of the reach of children. °Unopened vials may be stored in a refrigerator between 2 and 8 degrees C (36 and 46 degrees F). Do not freeze. The vials may also be kept at room temperature, below 25 degrees C (77 degrees F) for up to 1 month. Do not shake. Do not re-refrigerate once the product has been stored at room temperature. Keep in the original carton until needed for use. Throw away any unused medicine after the expiration date on the label. °NOTE: This sheet is a summary. It may not cover all possible information. If you have questions about this medicine, talk to your doctor, pharmacist, or health care provider. °© 2018 [employer]vier/Gold Standard (2016-11-21 19:23:01) ° °

## 2017-07-12 ENCOUNTER — Telehealth: Payer: Self-pay | Admitting: Internal Medicine

## 2017-07-12 NOTE — Telephone Encounter (Signed)
Pt is aware of results and voiced her understanding. Nothing further needed.    Progressive ILD on CT. Glad you are getting the alpha 1 replacement though emohysema might be stable. Will disuss at fu   IMPRESSION:  1. There continues to be a spectrum of findings indicative of  interstitial lung disease with minimal progression compared to the  prior study. The overall appearance is considered a "probable UIP CT  pattern."  2. Diffuse bronchial wall thickening with moderate centrilobular and  paraseptal emphysema; imaging findings suggestive of underlying  COPD.  3. Aortic atherosclerosis, in addition to left main and 2 vessel  coronary artery disease. Please note that although the presence of  coronary artery calcium documents the presence of coronary artery  disease, the severity of this disease and any potential stenosis  cannot be assessed on this non-gated CT examination. Assessment for  potential risk factor modification, dietary therapy or pharmacologic  therapy may be warranted, if clinically indicated.  4. Mild hepatic steatosis.    Aortic Atherosclerosis (ICD10-I70.0) and Emphysema (ICD10-J43.9).      Electronically Signed  By: Vinnie Langton M.D.  On: 06/22/2017 17:00    Signed By: Etheleen Mayhew, MD on 06/22/2017 5:00 PM

## 2017-07-19 DIAGNOSIS — G4733 Obstructive sleep apnea (adult) (pediatric): Secondary | ICD-10-CM | POA: Diagnosis not present

## 2017-08-03 ENCOUNTER — Telehealth: Payer: Self-pay | Admitting: Internal Medicine

## 2017-08-03 NOTE — Telephone Encounter (Signed)
States having severe aches in muscles and joints she thinks = reaction to prolastin  rec hold further rx and I will let Dr Chase Caller decide next step

## 2017-08-04 ENCOUNTER — Emergency Department (HOSPITAL_COMMUNITY): Payer: PPO

## 2017-08-04 ENCOUNTER — Encounter (HOSPITAL_COMMUNITY): Payer: Self-pay

## 2017-08-04 ENCOUNTER — Emergency Department (HOSPITAL_COMMUNITY)
Admission: EM | Admit: 2017-08-04 | Discharge: 2017-08-04 | Disposition: A | Discharge status: Home / Self Care | Payer: PPO | Attending: Emergency Medicine | Admitting: Emergency Medicine

## 2017-08-04 DIAGNOSIS — E119 Type 2 diabetes mellitus without complications: Secondary | ICD-10-CM | POA: Insufficient documentation

## 2017-08-04 DIAGNOSIS — Z7984 Long term (current) use of oral hypoglycemic drugs: Secondary | ICD-10-CM | POA: Diagnosis not present

## 2017-08-04 DIAGNOSIS — R110 Nausea: Secondary | ICD-10-CM | POA: Diagnosis not present

## 2017-08-04 DIAGNOSIS — J449 Chronic obstructive pulmonary disease, unspecified: Secondary | ICD-10-CM | POA: Diagnosis not present

## 2017-08-04 DIAGNOSIS — Z79899 Other long term (current) drug therapy: Secondary | ICD-10-CM | POA: Diagnosis not present

## 2017-08-04 DIAGNOSIS — R100 Acute abdomen: Secondary | ICD-10-CM | POA: Diagnosis not present

## 2017-08-04 DIAGNOSIS — K5732 Diverticulitis of large intestine without perforation or abscess without bleeding: Secondary | ICD-10-CM | POA: Diagnosis not present

## 2017-08-04 DIAGNOSIS — N189 Chronic kidney disease, unspecified: Secondary | ICD-10-CM | POA: Insufficient documentation

## 2017-08-04 DIAGNOSIS — Z87891 Personal history of nicotine dependence: Secondary | ICD-10-CM | POA: Insufficient documentation

## 2017-08-04 DIAGNOSIS — K5730 Diverticulosis of large intestine without perforation or abscess without bleeding: Secondary | ICD-10-CM | POA: Diagnosis not present

## 2017-08-04 DIAGNOSIS — R1032 Left lower quadrant pain: Secondary | ICD-10-CM | POA: Diagnosis present

## 2017-08-04 DIAGNOSIS — I129 Hypertensive chronic kidney disease with stage 1 through stage 4 chronic kidney disease, or unspecified chronic kidney disease: Secondary | ICD-10-CM | POA: Diagnosis not present

## 2017-08-04 DIAGNOSIS — K5792 Diverticulitis of intestine, part unspecified, without perforation or abscess without bleeding: Secondary | ICD-10-CM | POA: Insufficient documentation

## 2017-08-04 LAB — URINALYSIS, ROUTINE W REFLEX MICROSCOPIC
Bacteria, UA: NONE SEEN
Bilirubin Urine: NEGATIVE
Glucose, UA: NEGATIVE mg/dL
Hgb urine dipstick: NEGATIVE
Ketones, ur: NEGATIVE mg/dL
Leukocytes, UA: NEGATIVE
Nitrite: NEGATIVE
Protein, ur: NEGATIVE mg/dL
Specific Gravity, Urine: 1.006 (ref 1.005–1.030)
pH: 7 (ref 5.0–8.0)

## 2017-08-04 LAB — CBC WITH DIFFERENTIAL/PLATELET
Basophils Absolute: 0.1 10*3/uL (ref 0.0–0.1)
Basophils Relative: 1 %
Eosinophils Absolute: 0.6 10*3/uL (ref 0.0–0.7)
Eosinophils Relative: 5 %
HCT: 35 % — ABNORMAL LOW (ref 36.0–46.0)
Hemoglobin: 11.6 g/dL — ABNORMAL LOW (ref 12.0–15.0)
Lymphocytes Relative: 22 %
Lymphs Abs: 3 10*3/uL (ref 0.7–4.0)
MCH: 28.3 pg (ref 26.0–34.0)
MCHC: 33.1 g/dL (ref 30.0–36.0)
MCV: 85.4 fL (ref 78.0–100.0)
Monocytes Absolute: 1.1 10*3/uL — ABNORMAL HIGH (ref 0.1–1.0)
Monocytes Relative: 8 %
Neutro Abs: 8.9 10*3/uL — ABNORMAL HIGH (ref 1.7–7.7)
Neutrophils Relative %: 64 %
Platelets: 200 10*3/uL (ref 150–400)
RBC: 4.1 MIL/uL (ref 3.87–5.11)
RDW: 13.4 % (ref 11.5–15.5)
WBC: 13.7 10*3/uL — ABNORMAL HIGH (ref 4.0–10.5)

## 2017-08-04 MED ORDER — KETOROLAC TROMETHAMINE 15 MG/ML IJ SOLN
15.0000 mg | Freq: Once | INTRAMUSCULAR | Status: AC
  Administered 2017-08-04: 15 mg via INTRAVENOUS
  Filled 2017-08-04: qty 1

## 2017-08-04 MED ORDER — SODIUM CHLORIDE 0.9 % IV SOLN
INTRAVENOUS | Status: DC
  Administered 2017-08-04: 11:00:00 via INTRAVENOUS

## 2017-08-04 MED ORDER — AMOXICILLIN-POT CLAVULANATE 875-125 MG PO TABS: 1.0000 | ORAL_TABLET | Freq: Two times a day (BID) | ORAL | 0 refills | Status: DC

## 2017-08-04 MED ORDER — HYDROMORPHONE HCL 1 MG/ML IJ SOLN
0.7500 mg | Freq: Once | INTRAMUSCULAR | Status: AC
  Administered 2017-08-04: 0.75 mg via INTRAVENOUS
  Filled 2017-08-04: qty 1

## 2017-08-04 MED ORDER — AMOXICILLIN-POT CLAVULANATE 875-125 MG PO TABS
1.0000 | ORAL_TABLET | Freq: Once | ORAL | Status: AC
  Administered 2017-08-04: 1 via ORAL
  Filled 2017-08-04: qty 1

## 2017-08-04 MED ORDER — BARIUM SULFATE 2.1 % PO SUSP
450.0000 mL | Freq: Two times a day (BID) | ORAL | Status: DC | PRN
  Administered 2017-08-04: 450 mL via ORAL

## 2017-08-04 MED ORDER — PROMETHAZINE HCL 25 MG PO TABS: 25.0000 mg | ORAL_TABLET | Freq: Four times a day (QID) | ORAL | 0 refills | Status: DC | PRN

## 2017-08-04 MED ORDER — TRAMADOL HCL 50 MG PO TABS: 50.0000 mg | ORAL_TABLET | Freq: Four times a day (QID) | ORAL | 0 refills | Status: DC | PRN

## 2017-08-04 NOTE — ED Notes (Signed)
[facility] Pt [facility] for LLQ pain that woke her up about 5am Pt has a hx of diverticulitis Describes this pain as a sharp stabbing pain

## 2017-08-04 NOTE — ED Notes (Signed)
rn will collect labs at IV start

## 2017-08-04 NOTE — ED Provider Notes (Signed)
[place] DEPT Provider Note   CSN: [id number] Arrival date & time: 08/04/17  2774     History   Chief Complaint Chief Complaint  Patient presents with  . Abdominal Pain    HPI Angela [name] is a 71 y.o. female.  HPI   71 year old female with abdominal pain. Left lower quadrant. Woke her from sleep around 5 AM this morning. Pain is sharp and constant since then. Does not radiate. She went to bed in her usual state of health. Pain is worse with certain movements. No urinary complaints. No change in her bowel movements. Mild nausea. Vomiting. She does not feel distended. No fever. Has not tried taking anything for her symptoms. She has a past history of diverticulitis but states that current pain feels a little bit different. Surgical history significant for appendectomy, hysterectomy and hernia repair.  Past Medical History:  Diagnosis Date  . Alpha 1-antitrypsin PiMS phenotype   . Anemia 2014   mild  . Chronic kidney disease    early stages of some insufficiency  . Complication of anesthesia    passes out 24 hrs after getting  . COPD (chronic obstructive pulmonary disease) ([place])   . DDD (degenerative disc disease)   . Depression    with anxiety  . Diabetes mellitus   . Diverticulitis   . Diverticulosis   . Esophageal dysmotility 03/01/06   mild  . Fibromyalgia   . Hypertension   . Shingles   . Shortness of breath dyspnea   . Sleep apnea    wears CPAP  . Stroke [facility]) 2010   3 tia's    Patient Active Problem List   Diagnosis Date Noted  . Upper airway cough syndrome 11/15/2016  . Encounter for therapeutic drug monitoring 07/07/2016  . IPF (idiopathic pulmonary fibrosis) ([place]) 04/14/2016  . COPD exacerbation ([place]) 09/09/2015  . Alpha-1-antitrypsin deficiency ([place]) 09/09/2015  . Pulmonary emphysema with fibrosis of lung ([place]) 09/09/2015  . ILD (interstitial lung disease) ([place]) 09/09/2015  . Smoking history 08/11/2015  . Family history of alpha 1 antitrypsin  deficiency 08/11/2015  . Dyspnea and respiratory abnormality 08/11/2015  . HNP (herniated nucleus pulposus) 03/16/2015  . Obesity 02/05/2014  . Internal hemorrhoid 08/23/2013  . RUQ abdominal pain 08/23/2013  . Nonspecific abnormal finding in stool contents 08/23/2013  . Abdominal pain, epigastric 06/03/2013  . Acute diverticulitis 05/29/2013  . Abdominal pain, left lower quadrant 05/29/2013  . Nausea and vomiting 05/29/2013  . Leukocytosis 05/29/2013  . Acute renal failure ([place]) 05/29/2013  . Anemia 05/29/2013  . Diabetes ([place]) 05/29/2013  . Dyslipidemia 05/29/2013  . HTN (hypertension) 04/14/2013    Past Surgical History:  Procedure Laterality Date  . ABDOMINAL HYSTERECTOMY    . ANTERIOR FUSION CERVICAL SPINE    . APPENDECTOMY    . BACK SURGERY    . DE QUERVAIN'S RELEASE     right  . GASTRIC FUNDOPLICATION    . HERNIA REPAIR    . KNEE ARTHROSCOPY     left   . LUNG BIOPSY Left 03/23/2016   Procedure: LUNG BIOPSY;  Surgeon: Melrose Nakayama, MD;  Location: [facility];  Service: Thoracic;  Laterality: Left;  . LYMPH NODE BIOPSY Left 03/23/2016   Procedure: LYMPH NODE BIOPSY;  Surgeon: Melrose Nakayama, MD;  Location: [facility];  Service: Thoracic;  Laterality: Left;  . POSTERIOR CERVICAL FUSION/FORAMINOTOMY N/A 03/16/2015   Procedure: POSTERIOR CERVICAL LAMINECTOMY AND DISKECTOMOY AT CERVICAL SEVEN-THORACIC ONE ;POSTERIOR CERVICAL FUSION CERVICAL SIX TO THORACIC ONE;  Surgeon: Kary Kos, MD;  Location[facility] NEURO ORS;  Service: Neurosurgery;  Laterality: N/A;  Site is Cervical/Thoracic  . TONSILLECTOMY    . VIDEO ASSISTED THORACOSCOPY Left 03/23/2016   Procedure: VIDEO ASSISTED THORACOSCOPY;  Surgeon: Melrose Nakayama, MD;  Location: [facility];  Service: Thoracic;  Laterality: Left;    OB History    No data available       Home Medications    Prior to Admission medications   Medication Sig Start Date End Date Taking? Authorizing Provider  acetaminophen (TYLENOL) 325 MG  tablet Take 650 mg by mouth every 6 (six) hours as needed (joint pain/swelling).   Yes [provider]  albuterol (PROVENTIL) (2.5 MG/3ML) 0.083% nebulizer solution INAHLE CONTENTS OF ONE VIAL VIA NEBULIZER EVERY 6 HOURS AS NEEDED FOR WHEEZING OR SHORTNESS OF BREATH 09/21/15  Yes Brand Males, MD  atorvastatin (LIPITOR) 40 MG tablet Take 1 tablet by mouth daily. 11/15/16  Yes [provider]  brimonidine (ALPHAGAN) 0.15 % ophthalmic solution Place 1 drop into both eyes 3 (three) times daily. 07/12/17  Yes [provider]  buPROPion (WELLBUTRIN XL) 300 MG 24 hr tablet Take 1 tablet by mouth every morning. 05/04/16  Yes [provider]  cyclobenzaprine (FLEXERIL) 10 MG tablet Take 10 mg by mouth at bedtime as needed for muscle spasms.  06/09/15  Yes [provider]  fluticasone (FLONASE) 50 MCG/ACT nasal spray Place 2 sprays into both nostrils daily as needed for allergies.  11/20/14  Yes [provider]  gabapentin (NEURONTIN) 300 MG capsule Take 300 mg by mouth 3 (three) times daily. 06/27/17  Yes [provider]  hydrochlorothiazide (MICROZIDE) 12.5 MG capsule Take 1 capsule (12.5 mg total) by mouth daily. 05/22/17  Yes Jerline Pain, MD  losartan (COZAAR) 25 MG tablet TAKE 1 TABLET(25 MG) BY MOUTH DAILY 09/09/15  Yes Brand Males, MD  Menthol, Topical Analgesic, (BIOFREEZE) 4 % GEL Apply 1 application topically 3 (three) times daily as needed (for back pain).   Yes [provider]  metFORMIN (GLUCOPHAGE) 500 MG tablet Take 500 mg by mouth 2 (two) times daily with a meal.   Yes [provider]  metoprolol succinate (TOPROL-XL) 25 MG 24 hr tablet TAKE 1 TABLET(25 MG) BY MOUTH DAILY 05/22/17  Yes Jerline Pain, MD  Multiple Vitamin (MULTIVITAMIN) capsule Take 1 capsule by mouth daily.   Yes [provider]  ondansetron (ZOFRAN ODT) 4 MG disintegrating tablet Take 1 tablet (4 mg total) by mouth every 8 (eight) hours  as needed for nausea or vomiting. 07/25/15  Yes Little, Wenda Overland, MD  PROLASTIN-C 1000 MG SOLR injection Inject 1,000 mg into the skin once a week. 07/12/17  Yes [provider]  promethazine (PHENERGAN) 25 MG tablet Take 25 mg by mouth every 6 (six) hours as needed for nausea or vomiting. Reported on 04/12/2016   Yes [provider]  sertraline (ZOLOFT) 50 MG tablet Take 50 mg by mouth daily.   Yes [provider]  Tiotropium Bromide Monohydrate (SPIRIVA RESPIMAT) 1.25 MCG/ACT AERS Inhale 2 puffs into the lungs daily. 11/08/16  Yes Tanda Rockers, MD  traMADol (ULTRAM) 50 MG tablet Take 1-2 tablets (50-100 mg total) by mouth every 6 (six) hours as needed (mild pain). 03/26/16  Yes Barrett, Erin R, PA-C  predniSONE (DELTASONE) 10 MG tablet Take 4 tabs for 2 days, then 3 tabs for 2 days, 2 tabs for 2 days, then 1 tab for 2 days, then stop.  Patient not taking: Reported on 08/04/2017 06/28/17   Tanda Rockers, MD    Family History Family History  Problem Relation Age of Onset  . COPD Father   . Stomach cancer Maternal Grandfather   . Colon cancer Maternal Aunt     Social History Social History  Substance Use Topics  . Smoking status: Former Smoker    Packs/day: 1.00    Years: 22.00    Types: Cigarettes    Quit date: 04/14/2012  . Smokeless tobacco: Never Used     Comment: Quit 1 year ago in June 2013.    Marland Kitchen Alcohol use No     Allergies   Codeine; Iohexol; Tizanidine; and Ciprofloxacin   Review of Systems Review of Systems  All systems reviewed and negative, other than as noted in HPI.  Physical Exam Updated Vital Signs BP (!) 180/77 (BP Location: Right Arm)   Pulse (!) 59   Temp 98.4 F (36.9 C) (Oral)   Resp 16   Ht 5' 7.5" (1.715 m)   Wt 95.7 kg (211 lb)   LMP  (LMP Unknown)   SpO2 98%   BMI 32.56 kg/m   Physical Exam  Constitutional: She appears well-developed and well-nourished. No distress.  Laying in bed. Awake and appears  reasonably comfortable.  HENT:  Head: Normocephalic and atraumatic.  Eyes: Conjunctivae are normal. Right eye exhibits no discharge. Left eye exhibits no discharge.  Neck: Neck supple.  Cardiovascular: Normal rate, regular rhythm and normal heart sounds.  Exam reveals no gallop and no friction rub.   No murmur heard. Pulmonary/Chest: Effort normal and breath sounds normal. No respiratory distress.  Abdominal: Soft. She exhibits no distension. There is tenderness.  Tenderness in the left lower quadrant into a lesser degree suprapubically. No rebound or guarding. Does not seem distended.    Musculoskeletal: She exhibits no edema or tenderness.  Neurological: She is alert.  Skin: Skin is warm and dry.  Psychiatric: She has a normal mood and affect. Her behavior is normal. Thought content normal.  Nursing note and vitals reviewed.    ED Treatments / Results  Labs (all labs ordered are listed, but only abnormal results are displayed) Labs Reviewed  URINALYSIS, ROUTINE W REFLEX MICROSCOPIC - Abnormal; Notable for the following:       Result Value   Color, Urine STRAW (*)    Squamous Epithelial / LPF 0-5 (*)    All other components within normal limits  CBC WITH DIFFERENTIAL/PLATELET - Abnormal; Notable for the following:    WBC 13.7 (*)    Hemoglobin 11.6 (*)    HCT 35.0 (*)    Neutro Abs 8.9 (*)    Monocytes Absolute 1.1 (*)    All other components within normal limits    EKG  EKG Interpretation None       Radiology Ct Abdomen Pelvis Wo Contrast  Result Date: 08/04/2017 CLINICAL DATA:  Left lower quadrant abdominal pain since 5 a.m. today. Nausea. EXAM: CT ABDOMEN AND PELVIS WITHOUT CONTRAST TECHNIQUE: Multidetector CT imaging of the abdomen and pelvis was performed following the standard protocol without IV contrast. COMPARISON:  01/28/2016. FINDINGS: Lower chest: Interval small amount of linear atelectasis or scarring at both lung bases. Hepatobiliary: No focal liver  abnormality is seen. No gallstones, gallbladder wall thickening, or biliary dilatation. Pancreas: Unremarkable. No pancreatic ductal dilatation or surrounding inflammatory changes. Spleen: Normal in size without focal abnormality. Adrenals/Urinary Tract: Adrenal glands are unremarkable. Kidneys are normal, without renal calculi, focal lesion,  or hydronephrosis. Bladder is unremarkable. Stomach/Bowel: Large number of sigmoid and descending colon diverticula. There is concentric wall thickening and pericolonic soft tissue stranding adjacent to several larger diverticula in the distal descending/proximal sigmoid colon. Surgically absent appendix. Unremarkable small bowel. Post Nissen fundoplication changes. Vascular/Lymphatic: Atheromatous arterial calcifications without aneurysm. Mildly enlarged left lower quadrant abdominal lymph node in the area of soft tissue stranding. Reproductive: Status post hysterectomy. No adnexal masses. Other: No abdominal wall hernia or abnormality. No abdominopelvic ascites. Musculoskeletal: Lumbar and lower thoracic spine degenerative changes. These include marked disc space narrowing with vacuum phenomena at the L3-4 and L4-5 levels with posterior disc protrusions containing gas on the left at the L4-5 level and bilaterally at the L3-4 level. Mild bilateral hip degenerative changes. IMPRESSION: 1. Acute diverticulitis at the descending colon/sigmoid colon junction without abscess. Correlation with sigmoidoscopy/colonoscopy is recommended to exclude an underlying neoplasm. 2. Small, mildly reactive lymph node in the area of diverticulitis. 3. Extensive sigmoid and descending colon diverticulosis. Electronically Signed   By: Claudie Revering M.D.   On: 08/04/2017 13:17    Procedures Procedures (including critical care time)  Medications Ordered in ED Medications  0.9 %  sodium chloride infusion (not administered)  HYDROmorphone (DILAUDID) injection 0.75 mg (not administered)      Initial Impression / Assessment and Plan / ED Course  I have reviewed the triage vital signs and the nursing notes.  Pertinent labs & imaging results that were available during my care of the patient were reviewed by me and considered in my medical decision making (see chart for details).     CT does show diverticulitis. No perforation or abscess. Pain improved. I feel she is appropriate for outpatient treatment. Allergy to ciprofloxacin. She will be placed on Augmentin. When necessary tramadol and Phenergan. Return precautions discussed. Outpatient follow-up otherwise.  Final Clinical Impressions(s) / ED Diagnoses   Final diagnoses:  Diverticulitis    New Prescriptions New Prescriptions   No medications on file     Virgel Manifold, MD 08/04/17 1334

## 2017-08-08 ENCOUNTER — Telehealth: Payer: Self-pay | Admitting: Internal Medicine

## 2017-08-08 NOTE — Telephone Encounter (Signed)
Spoke with pt, who states she has the infusions of prolastin with the last one being 07/27/17. Pt states after her 07/27/17 infusion she developed joint pain. Pt was scheduled for her next infusion on 08/04/17. Pt called after hours on 08/03/17 due to pain and was instructed to hold 08/04/17 infusion until joint pain improved. Pt states joint pain has improved but not subsided.  Pt would like to know if she can resume infusions.  MR please advise. Thanks.

## 2017-08-08 NOTE — Telephone Encounter (Signed)
Pt is aware of MR recommendations and voiced her understanding. Nothing further needed.

## 2017-08-08 NOTE — Telephone Encounter (Signed)
Let patient know that - back pain reported < 5%, while mucle pain <16%. I do not see joint pain reported . So, I am not sure if is definitely prolastin related. She should get herr joint checked out especially if swollen. Regardless, I would say she should hold till pain resolves.    Dr. Brand Males, M.D., [facility].C.P Pulmonary and Critical Care Medicine Staff Physician [place] [facility] Pager: [phone], If no answer or between  15:00h - 7:00h: call [phone]  08/08/2017 10:57 AM    ..................................Marland Kitchen   Adverse Reactions Actual incidence may vary by product. >10%:  Central nervous system: Headache (?18%)  Gastrointestinal: Nausea (?11%)  Genitourinary: Urinary tract infection (?13%)  Local: Bruising at injection site (?16%)  Neuromuscular & skeletal: Myalgia (discomfort: ?16%)  Respiratory: Respiratory tract infection (?96%), exacerbation of chronic obstructive pulmonary disease (?38%), cough (?24%)  1% to 10%:  Cardiovascular: Chest pain (?8%), chest discomfort (?6%), vasodilatation (?5%), peripheral edema (?4%)  Central nervous system: Dizziness (?6%), fatigue (?6%), chills (?5%)  Dermatologic: Pruritus (?4%), urticaria (?4%)  Endocrine & metabolic: Hot flash (?5%)  Gastrointestinal: Diarrhea (?6%), sore throat (?5%), oral candidiasis (?5%)  Hepatic: Increased liver enzymes (?11%)  Immunologic: Antibody development (?8%)  Local: Bleeding at injection site (?5%)  Neuromuscular & skeletal: Weakness (?5%), back pain (?5%)  Respiratory: Rhinorrhea (?6%), sinusitis (?6%), bronchitis (?5%), rhinitis (?5%), dyspnea (?5%), pharyngitis (?1%)  Miscellaneous: Fever (?5%)  <1%, postmarketing and/or case reports: Abdominal distention, abdominal pain, anaphylactoid reaction, anaphylaxis, arthralgia, asthma, confusion, conjunctivitis, drowsiness, dysmenorrhea, exfoliative dermatitis, facial edema, feeling  abnormal, flu-like symptoms, flushing, hyperhidrosis, hypersensitivity reaction, hypertonia, hypoesthesia, hypotension, hypoxia, increased blood pressure, injection site reaction, insomnia, lip edema, lymph node pain, malaise, nervousness, pain, paresthesia, periorbital swelling, pharyngeal edema, pulmonary disease, skin rash, syncope, tachycardia, tinnitus, visual disturbance, vomiting  Contraindications History of anaphylaxis or other severe systemic reaction to A1-proteinase inhibitor or any component of the formulation; IgA deficient patients with antibodies against IgA.

## 2017-08-10 ENCOUNTER — Telehealth: Payer: Self-pay | Admitting: Internal Medicine

## 2017-08-10 NOTE — Telephone Encounter (Signed)
Ok to restart per cycle day whenever it is suspposed to be. If joint pain happens again - she should let us know  Dr. Brand Males, M.D., [facility].C.P Pulmonary and Critical Care Medicine Staff Physician [place] [facility] Pager: [phone], If no answer or between  15:00h - 7:00h: call [phone]  08/10/2017 4:30 PM

## 2017-08-10 NOTE — Telephone Encounter (Signed)
Spoke with pt, states she has been without joint pain X2 days, would like recs as to when to start Prolastin infusions.  MR please advise. Thanks.

## 2017-08-10 NOTE — Telephone Encounter (Signed)
Pt is aware of MR's recommendations and voiced her understanding. Nothing further needed.

## 2017-08-26 ENCOUNTER — Other Ambulatory Visit: Payer: Self-pay | Admitting: Cardiology

## 2017-08-30 DIAGNOSIS — H401121 Primary open-angle glaucoma, left eye, mild stage: Secondary | ICD-10-CM | POA: Diagnosis not present

## 2017-08-30 DIAGNOSIS — H04123 Dry eye syndrome of bilateral lacrimal glands: Secondary | ICD-10-CM | POA: Diagnosis not present

## 2017-08-30 DIAGNOSIS — H3530 Unspecified macular degeneration: Secondary | ICD-10-CM | POA: Diagnosis not present

## 2017-08-30 DIAGNOSIS — H11423 Conjunctival edema, bilateral: Secondary | ICD-10-CM | POA: Diagnosis not present

## 2017-08-30 DIAGNOSIS — H47232 Glaucomatous optic atrophy, left eye: Secondary | ICD-10-CM | POA: Diagnosis not present

## 2017-08-30 DIAGNOSIS — H353131 Nonexudative age-related macular degeneration, bilateral, early dry stage: Secondary | ICD-10-CM | POA: Diagnosis not present

## 2017-08-30 DIAGNOSIS — Z9849 Cataract extraction status, unspecified eye: Secondary | ICD-10-CM | POA: Diagnosis not present

## 2017-08-30 DIAGNOSIS — H11153 Pinguecula, bilateral: Secondary | ICD-10-CM | POA: Diagnosis not present

## 2017-08-30 DIAGNOSIS — H18413 Arcus senilis, bilateral: Secondary | ICD-10-CM | POA: Diagnosis not present

## 2017-08-30 DIAGNOSIS — Z961 Presence of intraocular lens: Secondary | ICD-10-CM | POA: Diagnosis not present

## 2017-09-01 DIAGNOSIS — R350 Frequency of micturition: Secondary | ICD-10-CM | POA: Diagnosis not present

## 2017-09-13 ENCOUNTER — Encounter: Payer: Self-pay | Admitting: Internal Medicine

## 2017-09-13 ENCOUNTER — Ambulatory Visit (INDEPENDENT_AMBULATORY_CARE_PROVIDER_SITE_OTHER): Payer: PPO | Admitting: Internal Medicine

## 2017-09-13 VITALS — BP 130/80 | HR 59 | Ht 67.5 in | Wt 216.0 lb

## 2017-09-13 DIAGNOSIS — Z23 Encounter for immunization: Secondary | ICD-10-CM | POA: Diagnosis not present

## 2017-09-13 DIAGNOSIS — J439 Emphysema, unspecified: Secondary | ICD-10-CM

## 2017-09-13 DIAGNOSIS — J84112 Idiopathic pulmonary fibrosis: Secondary | ICD-10-CM

## 2017-09-13 DIAGNOSIS — E8801 Alpha-1-antitrypsin deficiency: Secondary | ICD-10-CM | POA: Diagnosis not present

## 2017-09-13 DIAGNOSIS — J8410 Pulmonary fibrosis, unspecified: Secondary | ICD-10-CM | POA: Diagnosis not present

## 2017-09-13 LAB — PULMONARY FUNCTION TEST
DL/VA % PRED: 68 %
DL/VA: 3.55 ml/min/mmHg/L
DLCO COR: 12.35 ml/min/mmHg
DLCO cor % pred: 42 %
DLCO unc % pred: 42 %
DLCO unc: 12.31 ml/min/mmHg
FEF 25-75 PRE: 1.69 L/s
FEF2575-%PRED-PRE: 79 %
FEV1-%PRED-PRE: 72 %
FEV1-PRE: 1.9 L
FEV1FVC-%Pred-Pre: 101 %
FEV6-%PRED-PRE: 72 %
FEV6-PRE: 2.42 L
FEV6FVC-%PRED-PRE: 103 %
FVC-%PRED-PRE: 70 %
FVC-PRE: 2.45 L
PRE FEV6/FVC RATIO: 100 %
Pre FEV1/FVC ratio: 77 %

## 2017-09-13 NOTE — Patient Instructions (Addendum)
ICD-10-CM   1. IPF (idiopathic pulmonary fibrosis) ([place]) J84.112   2. Pulmonary emphysema with fibrosis of lung ([place]) J43.9    J84.10   3. Alpha-1-antitrypsin deficiency ([place]) E88.01   4. Needs flu shot Z23     Glad you are feeling better with prolastin Lung function though shows continued mild decline - we hve to have open mind if is the IPF Respect desire not to take IPF fibrotic therapy  Plan Flu shot 09/13/2017 Continue proloastin Continue scheduled inhalers for copd Refer pulmonix for ILD PRO study - they will contact your next few months  Followup - in 4 months do Pre-bd spiro and dlco only. No lung volume or bd response. No post-bd spiro - return to see me in 4 months or sooner if neede

## 2017-09-13 NOTE — Patient Instructions (Signed)
PFT done today. 

## 2017-09-13 NOTE — Progress Notes (Signed)
Subjective:     Patient ID: Angela [name], female   DOB: 1946/06/12, 71 y.o.   MRN: [id number]  HPI   \  OV 10/10/2016  Chief Complaint  Patient presents with  . Follow-up    Pt states overall her breathing has slightly improved since last OV. Pt states she has prod cough with yellow. Pt c/o midsternal CP 20 min after 2nd or 3rd dose of Esbriet.     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 This was also personally visualized. The main issues that were 3+3 times daily of Pirfenidone (Esbriet) she's having significant GI side effects of fatigue, nausea and bloated sensation and even chest pain after her dose. She's tried ginger she's had between meals. She has pacingg doses for several hours. All and communication with the help desk - but no improvement. She rates his symptoms is severe. Nevertheless because of the severity of pulmonary fibrosis and she is willing to continue with the medications although at a lower dose. She's not interested in switching over to  Ofev at this point in time. In the past she's not being in her about participating in the Pulm fibrosis foundation support group or research trials. She's not lost weight. Not yet had transplant discussion in detail. She is asking about alpha one replacement therapy because of the decline in lung function and also recurrent exacerbations with the antibiotic courses since last visit.    OV 10/10/2016  MR Chief Complaint  Patient presents with  . Follow-up    Pt states overall her breathing has slightly improved since last OV. Pt states she has prod cough with yellow. Pt c/o midsternal CP 20 min after 2nd or 3rd dose of Esbriet.   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 This was also personally visualized. The main issues that were 3+3 times daily of Pirfenidone (Esbriet) she's having significant GI side effects of fatigue, nausea and bloated sensation and even chest pain after her dose. She's tried ginger she's had between meals. She has pacingg doses for several hours. All and communication with the help desk - but no improvement. She rates his symptoms is severe. Nevertheless because of the severity of pulmonary fibrosis and she is willing to continue with the medications although at a lower dose. She's not interested in switching over to  Ofev at this point in time. In the past she's not being in her about participating in the Pulm fibrosis foundation support group or research trials. She's not lost weight. Not yet had transplant discussion in detail. She is asking about alpha one replacement therapy because of the decline in lung function and also recurrent exacerbations with the antibiotic courses since last visit.  rec    - start prolastin replacement for MZ phenotype with decline lung function and repeated flare up> denied by insurance  - continue esbriet but reduce dose to to 2 pill three times daily - take with food and ginger    11/08/2016 acute extended ov/Wert re:  GOLD 0 copd/ MZ quit smoking 2013 / underlying ILD on perfenidone  Chief Complaint  Patient presents with  . Acute Visit    coughing so much it burns, used nebulizer  to help, congestion with clear/yellow mucus, headache  new cough that caused vomiting late 90s  zpak did not work/ doxy/pred  Worked last flare and lasted  4-5 weeks then acutely worse x one day prior to OV with severe coughing fits and midline cp brought on by coughing some better with neb/ ? spiriva dpi making it worse   No obvious day to day or daytime variability or assoc   mucus plugs or hemoptysis or cp or chest tightness,  subjective wheeze or overt sinus or hb symptoms. No unusual exp hx or h/o childhood pna/ asthma or knowledge of premature birth.  Sleeping ok without nocturnal  or early am exacerbation  of respiratory  c/o's or need for noct saba. Also denies any obvious fluctuation of symptoms with weather or environmental changes or other aggravating or alleviating factors except as outlined above    OV 12/02/2016  Chief Complaint  Patient presents with  . Follow-up    Pt is here for 6 wk f/u. Pt fibrosis has remained unchanged, but has a question about the esbriet and prolastin    Follow-up idiopathic pulmonary fibrosis Emphysema MZ phenotype  Overall stable since last visit. Alpha-1 replacement has been approved but because of her  Ex-husband's death she has more money now and therefore her co-pay for both Prolastin and esbriet will increase. She's been worked [employer] to keep the co-pay cost down. If she does not succeed with this she will give up on both drugs. Prolastin has not been started yet. No new issues.   OV 06/08/2017  Chief Complaint  Patient presents with  . Follow-up    Pt states her breathing is unchanged since last OV. Pt c/o dry cough. Pt denies CP/tightness and f/c/s.      .Follow-up idiopathic pulmonary fibrosis with associated emphysema MZ phenotype with low levels of 75 mg/dL  She is here for routine follow-up. Last visit January 2018. Since then she's reporting progressive worsening of cough and shortness of breath particularly with significant exertion such as climbing stairs or walking fast. In terms of IPF she is not interested in Pirfenidone [facility]) because of side effects that she had. She gave this up late last year early this year. She is also not interested in Ofev because of the diarrhea side effects. She prefers to be on basic supportive care and she is interested in pulmonary IPF research trials. In terms of emphysema MZ phenotype she is very interested in alpha-1  replacement therapy. The last 6 months. Been getting insurance denial for the same. She showed me that Medicare part D criteria which is based on low levels, progression and also low FEV1. She will not be a candidate for low FEV1 . Results for  because of mixed emphysema and fibrosis. I will need to do a special appealed for this. Otherwise no other changes in health status.     OV 09/13/2017  Chief Complaint  Patient presents with  . Follow-up    PFT done today, breathing better on prolastin    Follow-up idiopathic pulmonary fibrosis on basic supportive care because she does not want to do either fibrotic Follow-up associated emphysema MZ phenotype with low levels - on Prolastin therapy  She is following up. She again continues to refuse anti-fibrotic therapy. She is now on Prolastin and has had 9 infusions. She is tolerating this well. She tells me that overall she feels the lungs a stronger Her cough quality is better able to bring her better sputum.  She feels that she is less at risk for infections. He just makes her feel good. In fact she feels confident that she can tolerate a flu shot and is willing to have at this time.however pulmonary function test continues to show decline as documented below    Results for Angela [name], Angela [name] (MRN [id number]) as of 09/13/2017 09:33  Ref. Range 08/28/2015 16:37 03/04/2016 12:53 08/16/2016 10:21 06/08/2017 08:48 09/13/2017 08:45  FVC-Pre Latest Units: L 2.81 2.79 2.69 2.63 2.45  FVC-%Pred-Pre Latest Units: % 79 79 76 75 70  Results for Angela [name], Angela [name] (MRN [id number]) as of 09/13/2017 09:33  Ref. Range 08/28/2015 16:37 03/04/2016 12:53 08/16/2016 10:21 06/08/2017 08:48 09/13/2017 08:45  DLCO cor Latest Units: ml/min/mmHg  15.20 13.90 13.43 12.35  DLCO cor % pred Latest Units: %  52 47 46 42       has a past medical history of Alpha 1-antitrypsin PiMS phenotype; Anemia (2014); Chronic kidney disease; Complication of anesthesia; COPD (chronic obstructive  pulmonary disease) ([place]); DDD (degenerative disc disease); Depression; Diabetes mellitus; Diverticulitis; Diverticulosis; Esophageal dysmotility (03/01/06); Fibromyalgia; Hypertension; Shingles; Shortness of breath dyspnea; Sleep apnea; and Stroke ([place]) (2010).   reports that she quit smoking about 5 years ago. Her smoking use included Cigarettes. She has a 22.00 pack-year smoking history. She has never used smokeless tobacco.  Past Surgical History:  Procedure Laterality Date  . ABDOMINAL HYSTERECTOMY    . ANTERIOR FUSION CERVICAL SPINE    . APPENDECTOMY    . BACK SURGERY    . DE QUERVAIN'S RELEASE     right  . GASTRIC FUNDOPLICATION    . HERNIA REPAIR    . KNEE ARTHROSCOPY     left   . LUNG BIOPSY Left 03/23/2016   Procedure: LUNG BIOPSY;  Surgeon: Melrose Nakayama, MD;  Location: [facility];  Service: Thoracic;  Laterality: Left;  . LYMPH NODE BIOPSY Left 03/23/2016   Procedure: LYMPH NODE BIOPSY;  Surgeon: Melrose Nakayama, MD;  Location: [facility];  Service: Thoracic;  Laterality: Left;  . POSTERIOR CERVICAL FUSION/FORAMINOTOMY N/A 03/16/2015   Procedure: POSTERIOR CERVICAL LAMINECTOMY AND DISKECTOMOY AT CERVICAL SEVEN-THORACIC ONE ;POSTERIOR CERVICAL FUSION CERVICAL SIX TO THORACIC ONE;  Surgeon: Kary Kos, MD;  Location: [facility];  Service: Neurosurgery;  Laterality: N/A;  Site is Cervical/Thoracic  . TONSILLECTOMY    . VIDEO ASSISTED THORACOSCOPY Left 03/23/2016   Procedure: VIDEO ASSISTED THORACOSCOPY;  Surgeon: Melrose Nakayama, MD;  Location: [facility];  Service: Thoracic;  Laterality: Left;    Allergies  Allergen Reactions  . Codeine Nausea And Vomiting  . Iohexol      Desc: Pt states several years ago during a CT scan w/ iv cotnrast she had severe nausea and vomiting, sob w/ difficulty breathing and swallowing.  She had full 13hr. premeds today (5/14/7) and did fine w/o complications.   . Tizanidine Other (See Comments)    Severe decrease in BP   . Ciprofloxacin Rash     Immunization History  Administered Date(s) Administered  . Pneumococcal-Unspecified 11/14/2012  . Tdap 09/05/2016    Family History  Problem Relation Age of Onset  . COPD Father   . Stomach cancer Maternal Grandfather   . Colon cancer Maternal Aunt      Current Outpatient Prescriptions:  .  acetaminophen (TYLENOL) 325 MG tablet, Take 650 mg by mouth every 6 (six) hours as needed (joint pain/swelling)., Disp: , Rfl:  .  albuterol (PROVENTIL) (2.5 MG/3ML) 0.083% nebulizer solution, INAHLE CONTENTS OF ONE  VIAL VIA NEBULIZER EVERY 6 HOURS AS NEEDED FOR WHEEZING OR SHORTNESS OF BREATH, Disp: 900 mL, Rfl: 1 .  atorvastatin (LIPITOR) 40 MG tablet, Take 1 tablet by mouth daily., Disp: , Rfl: 11 .  brimonidine (ALPHAGAN) 0.15 % ophthalmic solution, Place 1 drop into both eyes 3 (three) times daily., Disp: , Rfl: 3 .  buPROPion (WELLBUTRIN XL) 300 MG 24 hr tablet, Take 1 tablet by mouth every morning., Disp: , Rfl:  .  cyclobenzaprine (FLEXERIL) 10 MG tablet, Take 10 mg by mouth at bedtime as needed for muscle spasms. , Disp: , Rfl: 0 .  gabapentin (NEURONTIN) 300 MG capsule, Take 300 mg by mouth 3 (three) times daily., Disp: , Rfl: 3 .  hydrochlorothiazide (MICROZIDE) 12.5 MG capsule, Take 1 capsule (12.5 mg total) by mouth daily., Disp: 90 capsule, Rfl: 1 .  losartan (COZAAR) 25 MG tablet, TAKE 1 TABLET(25 MG) BY MOUTH DAILY, Disp: 90 tablet, Rfl: 1 .  Menthol, Topical Analgesic, (BIOFREEZE) 4 % GEL, Apply 1 application topically 3 (three) times daily as needed (for back pain)., Disp: , Rfl:  .  metFORMIN (GLUCOPHAGE) 500 MG tablet, Take 500 mg by mouth 2 (two) times daily with a meal., Disp: , Rfl:  .  metoprolol succinate (TOPROL-XL) 25 MG 24 hr tablet, TAKE 1 TABLET(25 MG) BY MOUTH DAILY, Disp: 90 tablet, Rfl: 1 .  Multiple Vitamin (MULTIVITAMIN) capsule, Take 1 capsule by mouth daily., Disp: , Rfl:  .  ondansetron (ZOFRAN ODT) 4 MG disintegrating tablet, Take 1 tablet (4 mg total) by  mouth every 8 (eight) hours as needed for nausea or vomiting., Disp: 10 tablet, Rfl: 0 .  PROLASTIN-C 1000 MG SOLR injection, Inject 1,000 mg into the skin once a week., Disp: , Rfl:  .  promethazine (PHENERGAN) 25 MG tablet, Take 1 tablet (25 mg total) by mouth every 6 (six) hours as needed for nausea or vomiting., Disp: 12 tablet, Rfl: 0 .  sertraline (ZOLOFT) 50 MG tablet, Take 50 mg by mouth daily., Disp: , Rfl:  .  Tiotropium Bromide Monohydrate (SPIRIVA RESPIMAT) 1.25 MCG/ACT AERS, Inhale 2 puffs into the lungs daily., Disp: 1 Inhaler, Rfl: 3 .  traMADol (ULTRAM) 50 MG tablet, Take 1-2 tablets (50-100 mg total) by mouth every 6 (six) hours as needed (mild pain)., Disp: 30 tablet, Rfl: 0   Review of Systems     Objective:   Physical Exam  Constitutional: She is oriented to person, place, and time. She appears well-developed and well-nourished. No distress.  HENT:  Head: Normocephalic and atraumatic.  Right Ear: External ear normal.  Left Ear: External ear normal.  Mouth/Throat: Oropharynx is clear and moist. No oropharyngeal exudate.  Eyes: Pupils are equal, round, and reactive to light. Conjunctivae and EOM are normal. Right eye exhibits no discharge. Left eye exhibits no discharge. No scleral icterus.  Neck: Normal range of motion. Neck supple. No JVD present. No tracheal deviation present. No thyromegaly present.  Cardiovascular: Normal rate, regular rhythm, normal heart sounds and intact distal pulses.  Exam reveals no gallop and no friction rub.   No murmur heard. Pulmonary/Chest: Effort normal. No respiratory distress. She has no wheezes. She has rales. She exhibits no tenderness.  Abdominal: Soft. Bowel sounds are normal. She exhibits no distension and no mass. There is no tenderness. There is no rebound and no guarding.  Musculoskeletal: Normal range of motion. She exhibits no edema or tenderness.  Lymphadenopathy:    She has no cervical adenopathy.  Neurological: She  is  alert and oriented to person, place, and time. She has normal reflexes. No cranial nerve deficit. She exhibits normal muscle tone. Coordination normal.  Skin: Skin is warm and dry. No rash noted. She is not diaphoretic. No erythema. No pallor.  Psychiatric: She has a normal mood and affect. Her behavior is normal. Judgment and thought content normal.  Vitals reviewed.  Vitals:   09/13/17 0934  BP: 130/80  Pulse: (!) 59  SpO2: 96%    There is no height or weight on file to calculate BMI.     Assessment:       ICD-10-CM   1. IPF (idiopathic pulmonary fibrosis) ([place]) J84.112   2. Pulmonary emphysema with fibrosis of lung ([place]) J43.9    J84.10   3. Alpha-1-antitrypsin deficiency ([place]) E88.01   4. Needs flu shot Z23        Plan:       Glad you are feeling better with prolastin Lung function though shows continued mild decline - we hve to have open mind if is the IPF Respect desire not to take IPF fibrotic therapy  Plan Flu shot 09/13/2017 Continue proloastin Continue scheduled inhalers for copd Refer [facility]ix for ILD PRO study - they will contact your next few months  Followup - in 4 months do Pre-bd spiro and dlco only. No lung volume or bd response. No post-bd spiro - return to see me in 4 months or sooner if neede   Dr. Brand Males, M.D., [facility].C.P Pulmonary and Critical Care Medicine Staff Physician [facility] Pager: [phone], If no answer or between  15:00h - 7:00h: call [phone]  09/13/2017 9:45 AM

## 2017-09-13 NOTE — Addendum Note (Signed)
Addended by: Rocky Morel D on: 09/13/2017 10:00 AM   Modules accepted: Orders

## 2017-09-19 DIAGNOSIS — S61213A Laceration without foreign body of left middle finger without damage to nail, initial encounter: Secondary | ICD-10-CM | POA: Diagnosis not present

## 2017-09-26 DIAGNOSIS — G5603 Carpal tunnel syndrome, bilateral upper limbs: Secondary | ICD-10-CM | POA: Diagnosis not present

## 2017-10-09 DIAGNOSIS — I10 Essential (primary) hypertension: Secondary | ICD-10-CM | POA: Diagnosis not present

## 2017-10-09 DIAGNOSIS — Z6834 Body mass index (BMI) 34.0-34.9, adult: Secondary | ICD-10-CM | POA: Diagnosis not present

## 2017-10-09 DIAGNOSIS — G5603 Carpal tunnel syndrome, bilateral upper limbs: Secondary | ICD-10-CM | POA: Diagnosis not present

## 2017-10-23 DIAGNOSIS — G4733 Obstructive sleep apnea (adult) (pediatric): Secondary | ICD-10-CM | POA: Diagnosis not present

## 2017-10-25 ENCOUNTER — Other Ambulatory Visit: Payer: Self-pay | Admitting: Internal Medicine

## 2017-10-31 DIAGNOSIS — G5603 Carpal tunnel syndrome, bilateral upper limbs: Secondary | ICD-10-CM | POA: Diagnosis not present

## 2017-10-31 DIAGNOSIS — G5602 Carpal tunnel syndrome, left upper limb: Secondary | ICD-10-CM | POA: Diagnosis not present

## 2017-10-31 DIAGNOSIS — I10 Essential (primary) hypertension: Secondary | ICD-10-CM | POA: Diagnosis not present

## 2017-10-31 DIAGNOSIS — Z6833 Body mass index (BMI) 33.0-33.9, adult: Secondary | ICD-10-CM | POA: Diagnosis not present

## 2017-11-06 DIAGNOSIS — K5792 Diverticulitis of intestine, part unspecified, without perforation or abscess without bleeding: Secondary | ICD-10-CM | POA: Diagnosis not present

## 2017-11-06 DIAGNOSIS — R1032 Left lower quadrant pain: Secondary | ICD-10-CM | POA: Diagnosis not present

## 2017-11-08 DIAGNOSIS — G5602 Carpal tunnel syndrome, left upper limb: Secondary | ICD-10-CM | POA: Diagnosis not present

## 2017-11-21 DIAGNOSIS — I129 Hypertensive chronic kidney disease with stage 1 through stage 4 chronic kidney disease, or unspecified chronic kidney disease: Secondary | ICD-10-CM | POA: Diagnosis not present

## 2017-11-21 DIAGNOSIS — E785 Hyperlipidemia, unspecified: Secondary | ICD-10-CM | POA: Diagnosis not present

## 2017-11-21 DIAGNOSIS — E1122 Type 2 diabetes mellitus with diabetic chronic kidney disease: Secondary | ICD-10-CM | POA: Diagnosis not present

## 2017-12-05 DIAGNOSIS — I129 Hypertensive chronic kidney disease with stage 1 through stage 4 chronic kidney disease, or unspecified chronic kidney disease: Secondary | ICD-10-CM | POA: Diagnosis not present

## 2017-12-26 DIAGNOSIS — I129 Hypertensive chronic kidney disease with stage 1 through stage 4 chronic kidney disease, or unspecified chronic kidney disease: Secondary | ICD-10-CM | POA: Diagnosis not present

## 2018-01-16 ENCOUNTER — Other Ambulatory Visit: Payer: Self-pay | Admitting: Internal Medicine

## 2018-01-16 DIAGNOSIS — R0600 Dyspnea, unspecified: Secondary | ICD-10-CM

## 2018-01-17 ENCOUNTER — Ambulatory Visit: Payer: PPO | Admitting: Internal Medicine

## 2018-01-26 DIAGNOSIS — G5601 Carpal tunnel syndrome, right upper limb: Secondary | ICD-10-CM | POA: Diagnosis not present

## 2018-02-01 ENCOUNTER — Other Ambulatory Visit: Payer: Self-pay | Admitting: Family Medicine

## 2018-02-01 DIAGNOSIS — M7989 Other specified soft tissue disorders: Secondary | ICD-10-CM

## 2018-02-02 ENCOUNTER — Other Ambulatory Visit: Payer: PPO

## 2018-02-14 ENCOUNTER — Ambulatory Visit (INDEPENDENT_AMBULATORY_CARE_PROVIDER_SITE_OTHER): Payer: PPO | Admitting: Internal Medicine

## 2018-02-14 ENCOUNTER — Encounter: Payer: Self-pay | Admitting: Internal Medicine

## 2018-02-14 VITALS — BP 128/88 | HR 58 | Ht 67.0 in | Wt 218.0 lb

## 2018-02-14 DIAGNOSIS — R0600 Dyspnea, unspecified: Secondary | ICD-10-CM

## 2018-02-14 DIAGNOSIS — J439 Emphysema, unspecified: Secondary | ICD-10-CM

## 2018-02-14 DIAGNOSIS — J8410 Pulmonary fibrosis, unspecified: Secondary | ICD-10-CM

## 2018-02-14 LAB — PULMONARY FUNCTION TEST
DL/VA % pred: 64 %
DL/VA: 3.34 ml/min/mmHg/L
DLCO unc % pred: 41 %
DLCO unc: 12.16 ml/min/mmHg
FEF 25-75 Pre: 1.65 L/sec
FEF2575-%Pred-Pre: 78 %
FEV1-%PRED-PRE: 72 %
FEV1-PRE: 1.89 L
FEV1FVC-%Pred-Pre: 102 %
FEV6-%Pred-Pre: 73 %
FEV6-PRE: 2.43 L
FEV6FVC-%Pred-Pre: 104 %
FVC-%PRED-PRE: 70 %
FVC-PRE: 2.44 L
PRE FEV6/FVC RATIO: 100 %
Pre FEV1/FVC ratio: 78 %

## 2018-02-14 NOTE — Progress Notes (Signed)
Patient completed Spiro & DLCO today. 

## 2018-02-19 ENCOUNTER — Encounter: Payer: Self-pay | Admitting: Acute Care

## 2018-02-19 ENCOUNTER — Ambulatory Visit: Payer: PPO | Admitting: Acute Care

## 2018-02-19 ENCOUNTER — Telehealth: Payer: Self-pay | Admitting: Acute Care

## 2018-02-19 VITALS — BP 126/82 | HR 87 | Ht 67.0 in | Wt 220.0 lb

## 2018-02-19 DIAGNOSIS — J439 Emphysema, unspecified: Secondary | ICD-10-CM

## 2018-02-19 DIAGNOSIS — J849 Interstitial pulmonary disease, unspecified: Secondary | ICD-10-CM

## 2018-02-19 DIAGNOSIS — J441 Chronic obstructive pulmonary disease with (acute) exacerbation: Secondary | ICD-10-CM | POA: Diagnosis not present

## 2018-02-19 DIAGNOSIS — J8410 Pulmonary fibrosis, unspecified: Secondary | ICD-10-CM

## 2018-02-19 DIAGNOSIS — E8801 Alpha-1-antitrypsin deficiency: Secondary | ICD-10-CM | POA: Diagnosis not present

## 2018-02-19 MED ORDER — AZITHROMYCIN 1 G PO PACK: 1.0000 g | PACK | Freq: Once | ORAL | 0 refills | Status: AC

## 2018-02-19 MED ORDER — PREDNISONE 10 MG PO TABS: ORAL_TABLET | ORAL | 0 refills | Status: DC

## 2018-02-19 NOTE — Telephone Encounter (Signed)
I saw Angela [name] today. She wants to know if there are any new drugs on the market for her IPF. Please let me know. Thanks so much.

## 2018-02-19 NOTE — Progress Notes (Signed)
History of Present Illness Angela [name] is a 72 y.o. 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 She is followed by Dr. Chase Caller.     02/19/2018 Follow up for PFT's: Pt. Presents for follow up after PFT's. She states she is doing well.She is no longer takes Esbriet for her fibrosis as she states her quality of life was poor while on the medication .She states she did have some shortness of breath last night. She took a breathing treatment and that helped. She thinks this is pollen/ allergy triggered.She does complain of reflux. She is currently not on her reflux medication.She states she is compliant with her CPAP.She states she has had some occasional wheezing and is concerned the pollen is a trigger for her bronchitis/ COPD at present. We reviewed her PFT's today. They are essentially stable. She states she is compliant with her Spiriva.She denies fever, chest pain, orthopnea or hemoptysis.  Test Results: PFT 02/14/2018>> FVC : 2.44/3.45/70% FEV1:  1.89/2.62/72% F/F Ratio: 78/ 76/ 102% DLCO 12.16 / 28.97/ 41%  CBC Latest Ref Rng & Units 08/04/2017 09/05/2016 03/25/2016  WBC 4.0 - 10.5 K/uL 13.7(H) - 10.3  Hemoglobin 12.0 - 15.0 g/dL 11.6(L) 11.9(L) 9.8(L)  Hematocrit 36.0 - 46.0 % 35.0(L) 35.0(L) 31.8(L)  Platelets 150 - 400 K/uL 200 - 205    BMP Latest Ref Rng & Units 09/05/2016 03/25/2016 03/24/2016  Glucose 65 - 99 mg/dL 93 114(H) 98  BUN 6 - 20 mg/dL 17 13 12   Creatinine 0.44 - 1.00 mg/dL 1.20(H) 1.17(H) 1.03(H)  Sodium 135 - 145 mmol/L 142 141 140  Potassium 3.5 - 5.1 mmol/L 4.1 4.8 4.7  Chloride 101 - 111 mmol/L 104 108 108  CO2 22 - 32 mmol/L - 24 24  Calcium 8.9 - 10.3 mg/dL - 8.8(L) 8.8(L)    BNP No results found for: BNP  ProBNP No results found for: PROBNP  PFT    Component Value Date/Time   FEV1PRE 1.89 02/14/2018 0914   FEV1POST 2.20 03/04/2016 1253   FVCPRE 2.44 02/14/2018  0914   FVCPOST 2.84 03/04/2016 1253   TLC 4.85 03/04/2016 1253   DLCOUNC 12.16 02/14/2018 0914   PREFEV1FVCRT 78 02/14/2018 0914   PSTFEV1FVCRT 77 03/04/2016 1253    No results found.   Past medical hx Past Medical History:  Diagnosis Date  . Alpha 1-antitrypsin PiMS phenotype   . Anemia 2014   mild  . Chronic kidney disease    early stages of some insufficiency  . Complication of anesthesia    passes out 24 hrs after getting  . COPD (chronic obstructive pulmonary disease) ([place])   . DDD (degenerative disc disease)   . Depression    with anxiety  . Diabetes mellitus   . Diverticulitis   . Diverticulosis   . Esophageal dysmotility 03/01/06   mild  . Fibromyalgia   . Hypertension   . Shingles   . Shortness of breath dyspnea   . Sleep apnea    wears CPAP  . Stroke [facility]) 2010   3 tia's     Social History   Tobacco Use  . Smoking status: Former Smoker    Packs/day: 1.00    Years: 22.00    Pack years: 22.00    Types: Cigarettes    Last attempt to quit: 04/14/2012    Years since quitting: 5.8  . Smokeless tobacco: Never Used  . Tobacco comment: Quit 1 year  ago in June 2013.    Substance Use Topics  . Alcohol use: No    Alcohol/week: 0.0 oz  . Drug use: No    [name] reports that she quit smoking about 5 years ago. Her smoking use included cigarettes. She has a 22.00 pack-year smoking history. She has never used smokeless tobacco. She reports that she does not drink alcohol or use drugs.  Tobacco Cessation: Former smoker with a 22 pack year smoking history, quit 2013  Past surgical hx, Family hx, Social hx all reviewed.  Current Outpatient Medications on File Prior to Visit  Medication Sig  . acetaminophen (TYLENOL) 325 MG tablet Take 650 mg by mouth every 6 (six) hours as needed (joint pain/swelling).  Marland Kitchen albuterol (PROVENTIL) (2.5 MG/3ML) 0.083% nebulizer solution INAHLE CONTENTS OF ONE VIAL VIA NEBULIZER EVERY 6 HOURS AS NEEDED FOR WHEEZING OR SHORTNESS OF  BREATH  . atorvastatin (LIPITOR) 40 MG tablet Take 1 tablet by mouth daily.  . brimonidine (ALPHAGAN) 0.15 % ophthalmic solution Place 1 drop into both eyes 3 (three) times daily.  Marland Kitchen buPROPion (WELLBUTRIN XL) 300 MG 24 hr tablet Take 1 tablet by mouth every morning.  . cyclobenzaprine (FLEXERIL) 10 MG tablet Take 10 mg by mouth at bedtime as needed for muscle spasms.   Marland Kitchen gabapentin (NEURONTIN) 300 MG capsule Take 300 mg by mouth 3 (three) times daily.  Marland Kitchen glipiZIDE (GLUCOTROL XL) 5 MG 24 hr tablet TK 1 T PO QD  . hydrochlorothiazide (MICROZIDE) 12.5 MG capsule Take 1 capsule (12.5 mg total) by mouth daily.  Marland Kitchen losartan (COZAAR) 25 MG tablet TAKE 1 TABLET(25 MG) BY MOUTH DAILY  . Menthol, Topical Analgesic, (BIOFREEZE) 4 % GEL Apply 1 application topically 3 (three) times daily as needed (for back pain).  . metFORMIN (GLUCOPHAGE-XR) 500 MG 24 hr tablet TK 2 TS PO BID  . metoprolol succinate (TOPROL-XL) 25 MG 24 hr tablet TAKE 1 TABLET(25 MG) BY MOUTH DAILY  . Multiple Vitamin (MULTIVITAMIN) capsule Take 1 capsule by mouth daily.  . ondansetron (ZOFRAN ODT) 4 MG disintegrating tablet Take 1 tablet (4 mg total) by mouth every 8 (eight) hours as needed for nausea or vomiting.  Marland Kitchen PROLASTIN-C 1000 MG SOLR injection Inject 1,000 mg into the skin once a week.  . promethazine (PHENERGAN) 25 MG tablet Take 1 tablet (25 mg total) by mouth every 6 (six) hours as needed for nausea or vomiting.  . sertraline (ZOLOFT) 50 MG tablet Take 50 mg by mouth daily.  Marland Kitchen SPIRIVA HANDIHALER 18 MCG inhalation capsule PLACE 1 CAPSULE INTO INHALER AND INHALE DAILY  . Tiotropium Bromide Monohydrate (SPIRIVA RESPIMAT) 1.25 MCG/ACT AERS Inhale 2 puffs into the lungs daily.  . traMADol (ULTRAM) 50 MG tablet Take 1-2 tablets (50-100 mg total) by mouth every 6 (six) hours as needed (mild pain).   No current facility-administered medications on file prior to visit.      Allergies  Allergen Reactions  . Codeine Nausea And  Vomiting  . Iohexol      Desc: Pt states several years ago during a CT scan w/ iv cotnrast she had severe nausea and vomiting, sob w/ difficulty breathing and swallowing.  She had full 13hr. premeds today (5/14/7) and did fine w/o complications.   . Tizanidine Other (See Comments)    Severe decrease in BP   . Betadine [Povidone Iodine] Rash  . Ciprofloxacin Rash    Review Of Systems:  Constitutional:   No  weight loss, night sweats,  Fevers, chills, +  fatigue, or  lassitude.  HEENT:   No headaches,  Difficulty swallowing,  Tooth/dental problems, or  Sore throat,                No sneezing, itching, ear ache, nasal congestion, post nasal drip,   CV:  No chest pain,  Orthopnea, PND, swelling in lower extremities, anasarca, dizziness, palpitations, syncope.   GI  No heartburn, indigestion, abdominal pain, nausea, vomiting, diarrhea, change in bowel habits, loss of appetite, bloody stools.   Resp: + shortness of breath with exertion less at rest.  No excess mucus, + productive cough,  No non-productive cough,  No coughing up of blood.  No change in color of mucus.  No wheezing.  No chest wall deformity  Skin: no rash or lesions.  GU: no dysuria, change in color of urine, no urgency or frequency.  No flank pain, no hematuria   MS:  No joint pain or swelling.  No decreased range of motion.  No back pain.  Psych:  No change in mood or affect. No depression or anxiety.  No memory loss.   Vital Signs BP 126/82 (BP Location: Left Arm, Cuff Size: Normal)   Pulse 87   Ht 5\' 7"  (1.702 m)   Wt 220 lb (99.8 kg)   LMP  (LMP Unknown)   SpO2 96%   BMI 34.46 kg/m    Physical Exam:  General- No distress,  A&Ox3, pleasant ENT: No sinus tenderness, TM clear, pale nasal mucosa, no oral exudate,no post nasal drip, no LAN Cardiac: S1, S2, regular rate and rhythm, no murmur Chest: + wheeze/ No  rales/ dullness; no accessory muscle use, no nasal flaring, no sternal retractions, crackles per  bases. Abd.: Soft Non-tender, ND, obese Ext: No clubbing cyanosis, edema Neuro:  normal strength Skin: No rashes, warm and dry Psych: normal mood and behavior   Assessment/Plan  COPD exacerbation (HCC) Flare 2/2 pollen as trigger Plan: Z pack Prednisone taper; 10 mg tablets: 4 tabs x 2 days, 3 tabs x 2 days, 2 tabs x 2 days 1 tab x 2 days then stop. Your PFT's look stable as compared to the ones done in 08/2017. Add Claritin or any other non-sedating antihistamine for your allergies daily. Restart prilosec for your reflux. Delsym for cough every 12 hours Avoid menthol, and mints Throat soothing with sugar free werthers or jolly ranchers.. Continue your prolastin infusions as you have been doing. Continue Spiriva daily. Follow up in 4 months with Dr. Chase Caller. 6 minute walk prior to follow up in 4 months. Please contact office for sooner follow up if symptoms do not improve or worsen or seek emergency care     Alpha-1-antitrypsin deficiency ([place]) Continue weekly prolastin infusions  Pulmonary emphysema with fibrosis of lung ([place]) Stable interval PFT's with little change Plan: Your PFT's look stable as compared to the ones done in 08/2017. Resume GERD PPI treatment Follow up in 4 months with Dr. Chase Caller. 6 minute walk prior to follow up in 4 months. Please contact office for sooner follow up if symptoms do not improve or worsen or seek emergency care     OSA>> Unsure who is managing  Continue on CPAP at bedtime. You appear to be benefiting from the treatment Goal is to wear for at least 6 hours each night for maximal clinical benefit. Continue to work on weight loss, as the link between excess weight  and sleep apnea is well established.  Do not drive if sleepy. Remember to clean  mask, tubing, filter, and reservoir once weekly with soapy water.  Follow up with Dr. Chase Caller or NP  In 4 months  or before as needed.  Please contact office for sooner follow up if  symptoms do not improve or worsen or seek emergency care   Magdalen Spatz, NP 02/19/2018  3:04 PM

## 2018-02-19 NOTE — Telephone Encounter (Signed)
esbriet or ofev or can do research trial  Dr. Brand Males, M.D., [facility].C.P Pulmonary and Critical Care Medicine Staff Physician, [place] Director - Interstitial Lung Disease  Program  Pulmonary [place] at [place], [place], [address]  Pager: [phone], If no answer or between  15:00h - 7:00h: call [phone] Telephone: [phone]

## 2018-02-19 NOTE — Assessment & Plan Note (Signed)
Flare 2/2 pollen as trigger Plan: Z pack Prednisone taper; 10 mg tablets: 4 tabs x 2 days, 3 tabs x 2 days, 2 tabs x 2 days 1 tab x 2 days then stop. Your PFT's look stable as compared to the ones done in 08/2017. Add Claritin or any other non-sedating antihistamine for your allergies daily. Restart prilosec for your reflux. Delsym for cough every 12 hours Avoid menthol, and mints Throat soothing with sugar free werthers or jolly ranchers.. Continue your prolastin infusions as you have been doing. Continue Spiriva daily. Follow up in 4 months with Dr. Chase Caller. 6 minute walk prior to follow up in 4 months. Please contact office for sooner follow up if symptoms do not improve or worsen or seek emergency care

## 2018-02-19 NOTE — Telephone Encounter (Signed)
Called and spoke with pt letting her know the two meds to help with her IPF are Esbriet and OFEV or she could do research trial.  Pt stated to me she previously was on Esbriet about a year ago and stated it made her so sick that she does not want to go back on it.  Pt also stated to me she does not want to take OFEV and also does not want to be apart of the research study.  Pt stated to me she thought MR had mentioned some new meds that were supposed to be coming out 2019 and wanted to know names of those meds.  MR, please advise if there are new meds other than esbriet and ofev that have come out this year?  Thanks!

## 2018-02-19 NOTE — Assessment & Plan Note (Addendum)
Continue weekly prolastin infusions

## 2018-02-19 NOTE — Patient Instructions (Addendum)
It is good to meet you today. Z pack Prednisone taper; 10 mg tablets: 4 tabs x 2 days, 3 tabs x 2 days, 2 tabs x 2 days 1 tab x 2 days then stop. Your PFT's look stable as compared to the ones done in 08/2017. Add Claritin or any other non-sedating antihistamine for your allergies daily. Restart prilosec for your reflux. Delsym for cough every 12 hours Avoid menthol, and mints Throat soothing with sugar free werthers or jolly ranchers.. Continue your prolastin infusions as you have been doing. Continue Spiriva daily. Continue CPAP daily as you have been doing. Follow up in 4 months with Dr. Chase Caller. 6 minute walk prior to follow up in 4 months. Please contact office for sooner follow up if symptoms do not improve or worsen or seek emergency care

## 2018-02-19 NOTE — Assessment & Plan Note (Signed)
Stable interval PFT's with little change Plan: Your PFT's look stable as compared to the ones done in 08/2017. Resume GERD PPI treatment Follow up in 4 months with Dr. Chase Caller. 6 minute walk prior to follow up in 4 months. Please contact office for sooner follow up if symptoms do not improve or worsen or seek emergency care

## 2018-02-20 NOTE — Telephone Encounter (Signed)
There are NO NEW MEDS other than esbriet/ofev.   You can also repeat, "There are plenty of research trials where she could help/volunteer in drug development for th future of patients like her and maybe in the process she might benefit  However, if research is not her thing is ok". There are tablet trials, inhaler trials and infusion trials she can potentially avail of in the summer/fall 2019 if she meets criteria

## 2018-02-28 NOTE — Telephone Encounter (Signed)
Called and spoke with pt letting her know the information stated by MR.  Pt stated at this time she will hold off on trying new meds and would update [place] if she changed her mind.  Nothing further needed at this time.

## 2018-05-06 NOTE — Progress Notes (Signed)
Canceled visit

## 2018-07-05 ENCOUNTER — Ambulatory Visit (INDEPENDENT_AMBULATORY_CARE_PROVIDER_SITE_OTHER): Payer: PPO | Admitting: *Deleted

## 2018-07-05 ENCOUNTER — Ambulatory Visit (INDEPENDENT_AMBULATORY_CARE_PROVIDER_SITE_OTHER): Payer: PPO | Admitting: Internal Medicine

## 2018-07-05 ENCOUNTER — Encounter: Payer: Self-pay | Admitting: Internal Medicine

## 2018-07-05 VITALS — BP 144/58 | HR 72 | Ht 67.5 in | Wt 213.4 lb

## 2018-07-05 DIAGNOSIS — J849 Interstitial pulmonary disease, unspecified: Secondary | ICD-10-CM

## 2018-07-05 DIAGNOSIS — J8410 Pulmonary fibrosis, unspecified: Secondary | ICD-10-CM | POA: Diagnosis not present

## 2018-07-05 DIAGNOSIS — J84112 Idiopathic pulmonary fibrosis: Secondary | ICD-10-CM

## 2018-07-05 DIAGNOSIS — J439 Emphysema, unspecified: Secondary | ICD-10-CM

## 2018-07-05 DIAGNOSIS — E8801 Alpha-1-antitrypsin deficiency: Secondary | ICD-10-CM | POA: Diagnosis not present

## 2018-07-05 MED ORDER — ALBUTEROL SULFATE (2.5 MG/3ML) 0.083% IN NEBU: 2.5000 mg | INHALATION_SOLUTION | Freq: Four times a day (QID) | RESPIRATORY_TRACT | 1 refills | Status: DC | PRN

## 2018-07-05 NOTE — Patient Instructions (Addendum)
ICD-10-CM   1. Pulmonary emphysema with fibrosis of lung ([place]) J43.9    J84.10   2. Alpha-1-antitrypsin deficiency ([place]) E88.01   3. Pulmonary emphysema, unspecified emphysema type ([place]) J43.9   4. IPF (idiopathic pulmonary fibrosis) (HCC) J84.112     Lung stable x 10 months  Plan Continue proloastin Continue spiriva Respect no IPF antifibrotics or IPF trial per your wish  Followup Routine visit in 6 months; CAT Score and simple (not 78mwd) at followup Spiromerty and dlco at followup Consider CT at followup

## 2018-07-05 NOTE — Progress Notes (Signed)
   SIX MIN WALK 07/05/2018 06/08/2017 07/07/2016 08/13/2015  Medications glucophage xr,toprol,zoloft,spiriva,lipitor,glucotrol,hydrochlorthiazide,losartan-all taken at 0800 - - -  Supplimental Oxygen during Test? (L/min) No No No No  Laps 9 - - -  Partial Lap (in Meters) 24 - - -  Baseline BP (sitting) 142/60 - - -  Baseline Heartrate 66 - - -  Baseline Dyspnea (Borg Scale) 1 - - -  Baseline Fatigue (Borg Scale) 1 - - -  Baseline SPO2 96 - - -  BP (sitting) 160/62 - - -  Heartrate 98 - - -  Dyspnea (Borg Scale) 3 - - -  Fatigue (Borg Scale) 6 - - -  SPO2 92 - - -  BP (sitting) 158/62 - - -  Heartrate 82 - - -  SPO2 96 - - -  Stopped or Paused before Six Minutes No - - -  Distance Completed 456 - - -  Tech Comments: rechecked BP 5 min post 150/34 - - -

## 2018-07-05 NOTE — Progress Notes (Signed)
Subjective:     Patient ID: Angela [name], female   DOB: Apr 03, 1946, 72 y.o.   MRN: [id number]  HPI   \  OV 10/10/2016  Chief Complaint  Patient presents with  . Follow-up    Pt states overall her breathing has slightly improved since last OV. Pt states she has prod cough with yellow. Pt c/o midsternal CP 20 min after 2nd or 3rd dose of Esbriet.     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 This was also personally visualized. The main issues that were 3+3 times daily of Pirfenidone (Esbriet) she's having significant GI side effects of fatigue, nausea and bloated sensation and even chest pain after her dose. She's tried ginger she's had between meals. She has pacingg doses for several hours. All and communication with the help desk - but no improvement. She rates his symptoms is severe. Nevertheless because of the severity of pulmonary fibrosis and she is willing to continue with the medications although at a lower dose. She's not interested in switching over to  Ofev at this point in time. In the past she's not being in her about participating in the Pulm fibrosis foundation support group or research trials. She's not lost weight. Not yet had transplant discussion in detail. She is asking about alpha one replacement therapy because of the decline in lung function and also recurrent exacerbations with the antibiotic courses since last visit.    OV 10/10/2016  MR Chief Complaint  Patient presents with  . Follow-up    Pt states overall her breathing has slightly improved since last OV. Pt states she has prod cough with yellow. Pt c/o midsternal CP 20 min after 2nd or 3rd dose of Esbriet.   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 This was also personally visualized. The main issues that were 3+3 times daily of Pirfenidone (Esbriet) she's having significant GI side effects of fatigue, nausea and bloated sensation and even chest pain after her dose. She's tried ginger she's had between meals. She has pacingg doses for several hours. All and communication with the help desk - but no improvement. She rates his symptoms is severe. Nevertheless because of the severity of pulmonary fibrosis and she is willing to continue with the medications although at a lower dose. She's not interested in switching over to  Ofev at this point in time. In the past she's not being in her about participating in the Pulm fibrosis foundation support group or research trials. She's not lost weight. Not yet had transplant discussion in detail. She is asking about alpha one replacement therapy because of the decline in lung function and also recurrent exacerbations with the antibiotic courses since last visit.  rec    - start prolastin replacement for MZ phenotype with decline lung function and repeated flare up> denied by insurance  - continue esbriet but reduce dose to to 2 pill three times daily - take with food and ginger    11/08/2016 acute extended ov/Wert re:  GOLD 0 copd/ MZ quit smoking 2013 / underlying ILD on perfenidone  Chief Complaint  Patient presents with  . Acute Visit    coughing so much it burns, used nebulizer  to help, congestion with clear/yellow mucus, headache  new cough that caused vomiting late 90s  zpak did not work/ doxy/pred  Worked last flare and lasted  4-5 weeks then acutely worse x one day prior to OV with severe coughing fits and midline cp brought on by coughing some better with neb/ ? spiriva dpi making it worse   No obvious day to day or daytime variability or assoc   mucus plugs or hemoptysis or cp or chest tightness,  subjective wheeze or overt sinus or hb symptoms. No unusual exp hx or h/o childhood pna/ asthma or knowledge of premature birth.  Sleeping ok without nocturnal  or early am exacerbation  of respiratory  c/o's or need for noct saba. Also denies any obvious fluctuation of symptoms with weather or environmental changes or other aggravating or alleviating factors except as outlined above    OV 12/02/2016  Chief Complaint  Patient presents with  . Follow-up    Pt is here for 6 wk f/u. Pt fibrosis has remained unchanged, but has a question about the esbriet and prolastin    Follow-up idiopathic pulmonary fibrosis Emphysema MZ phenotype  Overall stable since last visit. Alpha-1 replacement has been approved but because of her  Ex-husband's death she has more money now and therefore her co-pay for both Prolastin and esbriet will increase. She's been worked [employer] to keep the co-pay cost down. If she does not succeed with this she will give up on both drugs. Prolastin has not been started yet. No new issues.   OV 06/08/2017  Chief Complaint  Patient presents with  . Follow-up    Pt states her breathing is unchanged since last OV. Pt c/o dry cough. Pt denies CP/tightness and f/c/s.      .Follow-up idiopathic pulmonary fibrosis with associated emphysema MZ phenotype with low levels of 75 mg/dL  She is here for routine follow-up. Last visit January 2018. Since then she's reporting progressive worsening of cough and shortness of breath particularly with significant exertion such as climbing stairs or walking fast. In terms of IPF she is not interested in Pirfenidone [facility]) because of side effects that she had. She gave this up late last year early this year. She is also not interested in Ofev because of the diarrhea side effects. She prefers to be on basic supportive care and she is interested in pulmonary IPF research trials. In terms of emphysema MZ phenotype she is very interested in alpha-1  replacement therapy. The last 6 months. Been getting insurance denial for the same. She showed me that Medicare part D criteria which is based on low levels, progression and also low FEV1. She will not be a candidate for low FEV1 . Results for  because of mixed emphysema and fibrosis. I will need to do a special appealed for this. Otherwise no other changes in health status.     OV 09/13/2017  Chief Complaint  Patient presents with  . Follow-up    PFT done today, breathing better on prolastin    Follow-up idiopathic pulmonary fibrosis on basic supportive care because she does not want to do either fibrotic Follow-up associated emphysema MZ phenotype with low levels - on Prolastin therapy  She is following up. She again continues to refuse anti-fibrotic therapy. She is now on Prolastin and has had 9 infusions. She is tolerating this well. She tells me that overall she feels the lungs a stronger Her cough quality is better able to bring her better sputum.  She feels that she is less at risk for infections. He just makes her feel good. In fact she feels confident that she can tolerate a flu shot and is willing to have at this time.however pulmonary function test continues to show decline as documented below    OV 07/05/2018  Chief Complaint  Patient presents with  . Follow-up    Pt had a 1mw performed today.  Pt states she has been having some problems with the heat and breathing in dirt from where they have been doing a lot of construction. States she has been having an occ. cough with yellow mucus and states when it rains she will have some pain in her left lung.     Follow-up idiopathic pulmonary fibrosis on basic supportive care because she does not want to do either fibrotic Follow-up associated emphysema MZ phenotype with low levels - on Prolastin therapy   Angela [name] - returns for follow-up of the above. She continues to do well. Minimally symptomatic. Walking desaturation test 6  minute walk test did not desaturate. I was very concerned in October 2018 that despite Prolastin her lung function was declining as documented below but since then she's not had any exacerbations except one in spring 2019And April 2019 Angela [name] function test showed stability. She certainly feels she is stable. She does not want any antibiotics. She's not interested in IPF research trials  Results for Angela [name], Angela "LAIKEN SANDY" (MRN [id number]) as of 07/05/2018 11:21  Ref. Range 08/28/2015 16:37 03/04/2016 12:53 08/16/2016 10:21 06/08/2017 08:48 09/13/2017 08:45 02/14/2018 09:14  FVC-Pre Latest Units: L 2.81 2.79 2.69 2.63 2.45 2.44  FVC-%Pred-Pre Latest Units: % 79 79 76 75 70 70     Results for Angela [name], Angela "KESHAYLA SCHRUM" (MRN [id number]) as of 07/05/2018 11:21  Ref. Range 08/28/2015 16:37 03/04/2016 12:53 08/16/2016 10:21 06/08/2017 08:48 09/13/2017 08:45 02/14/2018 09:14  DLCO unc Latest Units: ml/min/mmHg 15.56 14.76 14.10 13.31 12.31 12.16  DLCO unc % pred Latest Units: % 53 50 48 45 42 41        has a past medical history of Alpha 1-antitrypsin PiMS phenotype, Anemia (2014), Chronic kidney disease, Complication of anesthesia, COPD (chronic obstructive pulmonary disease) ([place]), DDD (degenerative disc disease), Depression, Diabetes mellitus, Diverticulitis, Diverticulosis, Esophageal dysmotility (03/01/06), Fibromyalgia, Hypertension, Shingles, Shortness of breath dyspnea, Sleep apnea, and Stroke ([place]) (2010).   reports that she quit smoking about 6 years ago. Her smoking use included cigarettes. She has a 22.00 pack-year smoking history. She has never used smokeless tobacco.  Past Surgical History:  Procedure Laterality Date  . ABDOMINAL HYSTERECTOMY    . ANTERIOR FUSION CERVICAL SPINE    . APPENDECTOMY    . BACK SURGERY    . DE QUERVAIN'S RELEASE     right  . GASTRIC FUNDOPLICATION    . HERNIA REPAIR    . KNEE ARTHROSCOPY     left   . LUNG BIOPSY Left 03/23/2016   Procedure: LUNG BIOPSY;   Surgeon: Melrose Nakayama, MD;  Location: [facility];  Service: Thoracic;  Laterality: Left;  . LYMPH NODE BIOPSY Left 03/23/2016   Procedure: LYMPH NODE BIOPSY;  Surgeon: Melrose Nakayama, MD;  Location: [facility];  Service: Thoracic;  Laterality: Left;  . POSTERIOR CERVICAL FUSION/FORAMINOTOMY N/A 03/16/2015   Procedure: POSTERIOR CERVICAL LAMINECTOMY AND DISKECTOMOY AT CERVICAL SEVEN-THORACIC ONE ;POSTERIOR CERVICAL FUSION CERVICAL SIX TO THORACIC ONE;  Surgeon: Kary Kos, MD;  Location: [facility];  Service: Neurosurgery;  Laterality: N/A;  Site is Cervical/Thoracic  . TONSILLECTOMY    . VIDEO ASSISTED THORACOSCOPY Left 03/23/2016   Procedure: VIDEO ASSISTED THORACOSCOPY;  Surgeon: Melrose Nakayama, MD;  Location: [facility];  Service: Thoracic;  Laterality: Left;    Allergies  Allergen Reactions  . Codeine Nausea And Vomiting  . Iohexol      Desc: Pt states several years ago during a CT scan w/ iv cotnrast she had severe nausea and vomiting, sob w/ difficulty breathing and swallowing.  She had full 13hr. premeds today (5/14/7) and did fine w/o complications.   . Tizanidine Other (See Comments)    Severe decrease in BP   . Betadine [Povidone Iodine] Rash  . Ciprofloxacin Rash    Immunization History  Administered Date(s) Administered  . Influenza, High Dose Seasonal PF 09/13/2017  . Pneumococcal-Unspecified 11/14/2012  . Tdap 09/05/2016    Family History  Problem Relation Age of Onset  . COPD Father   . Stomach cancer Maternal Grandfather   . Colon cancer Maternal Aunt      Current Outpatient Medications:  .  acetaminophen (TYLENOL) 325 MG tablet, Take 650 mg by mouth every 6 (six) hours as needed (joint pain/swelling)., Disp: , Rfl:  .  albuterol (PROVENTIL) (2.5 MG/3ML) 0.083% nebulizer solution, Take 3 mLs (2.5 mg total) by nebulization every 6 (six) hours as needed for wheezing or shortness of breath., Disp: 900 mL, Rfl: 1 .  atorvastatin (LIPITOR) 40 MG tablet, Take 1  tablet by mouth daily., Disp: , Rfl: 11 .  brimonidine (ALPHAGAN) 0.15 % ophthalmic solution, Place 1 drop into both eyes 3 (three) times daily., Disp: , Rfl: 3 .  buPROPion (WELLBUTRIN XL) 300 MG 24 hr tablet, Take 1 tablet by mouth every morning., Disp: , Rfl:  .  cyclobenzaprine (FLEXERIL) 10 MG tablet, Take 10 mg by mouth at bedtime as needed for muscle spasms. , Disp: , Rfl: 0 .  glipiZIDE (GLUCOTROL XL) 5 MG 24 hr tablet, TK 1 T PO QD, Disp: , Rfl: 11 .  hydrochlorothiazide (MICROZIDE) 12.5 MG capsule, Take 1 capsule (12.5 mg total) by mouth daily., Disp: 90 capsule, Rfl: 1 .  losartan (COZAAR) 25 MG tablet, TAKE 1 TABLET(25 MG) BY MOUTH DAILY, Disp: 90 tablet, Rfl: 1 .  Menthol, Topical Analgesic, (BIOFREEZE) 4 % GEL, Apply 1 application topically 3 (three) times daily as needed (for back pain)., Disp: , Rfl:  .  metFORMIN (GLUCOPHAGE-XR) 500 MG 24 hr tablet, TK 2 TS PO BID, Disp: , Rfl: 11 .  metoprolol succinate (TOPROL-XL) 25 MG 24 hr tablet, TAKE 1 TABLET(25 MG) BY MOUTH DAILY, Disp: 90 tablet, Rfl: 1 .  Multiple Vitamin (MULTIVITAMIN) capsule, Take 1 capsule by mouth daily., Disp: , Rfl:  .  ondansetron (ZOFRAN ODT) 4 MG disintegrating tablet, Take 1 tablet (4 mg total) by mouth every 8 (eight) hours as needed for nausea or vomiting., Disp: 10 tablet, Rfl: 0 .  PROLASTIN-C 1000 MG SOLR injection, Inject 1,000 mg into the skin once a week., Disp: , Rfl:  .  promethazine (PHENERGAN) 25 MG tablet, Take 1 tablet (25 mg total) by mouth every 6 (six) hours as needed for nausea or vomiting., Disp: 12 tablet, Rfl: 0 .  sertraline (ZOLOFT) 50 MG tablet, Take 50 mg by mouth daily., Disp: , Rfl:  .  SPIRIVA HANDIHALER 18 MCG inhalation capsule, PLACE 1 CAPSULE INTO INHALER AND INHALE DAILY, Disp: 30 capsule, Rfl: 5 .  traMADol (ULTRAM) 50 MG tablet, Take 1-2 tablets (50-100 mg  total) by mouth every 6 (six) hours as needed (mild pain)., Disp: 30 tablet, Rfl: 0   Review of Systems     Objective:    Physical Exam  Constitutional: She is oriented to person, place, and time. She appears well-developed and well-nourished. No distress.  HENT:  Head: Normocephalic and atraumatic.  Right Ear: External ear normal.  Left Ear: External ear normal.  Mouth/Throat: Oropharynx is clear and moist. No oropharyngeal exudate.  Eyes: Pupils are equal, round, and reactive to light. Conjunctivae and EOM are normal. Right eye exhibits no discharge. Left eye exhibits no discharge. No scleral icterus.  Neck: Normal range of motion. Neck supple. No JVD present. No tracheal deviation present. No thyromegaly present.  Cardiovascular: Normal rate, regular rhythm, normal heart sounds and intact distal pulses. Exam reveals no gallop and no friction rub.  No murmur heard. Pulmonary/Chest: Effort normal and breath sounds normal. No respiratory distress. She has no wheezes. She has no rales. She exhibits no tenderness.  Scattered basal crackles  Abdominal: Soft. Bowel sounds are normal. She exhibits no distension and no mass. There is no tenderness. There is no rebound and no guarding.  Musculoskeletal: Normal range of motion. She exhibits no edema or tenderness.  Lymphadenopathy:    She has no cervical adenopathy.  Neurological: She is alert and oriented to person, place, and time. She has normal reflexes. No cranial nerve deficit. She exhibits normal muscle tone. Coordination normal.  Skin: Skin is warm and dry. No rash noted. She is not diaphoretic. No erythema. No pallor.  Psychiatric: She has a normal mood and affect. Her behavior is normal. Judgment and thought content normal.  Vitals reviewed.   Vitals:   07/05/18 1107  BP: (!) 144/58  Pulse: 72  SpO2: 97%  Weight: 213 lb 6.4 oz (96.8 kg)  Height: 5' 7.5" (1.715 m)    Estimated body mass index is 32.93 kg/m as calculated from the following:   Height as of this encounter: 5' 7.5" (1.715 m).   Weight as of this encounter: 213 lb 6.4 oz (96.8  kg).      Assessment:       ICD-10-CM   1. Pulmonary emphysema with fibrosis of lung ([place]) J43.9    J84.10   2. Alpha-1-antitrypsin deficiency ([place]) E88.01   3. Pulmonary emphysema, unspecified emphysema type ([facility]) J43.9   4. IPF (idiopathic pulmonary fibrosis) ([facility]) J84.112        Plan:      Lung stable x 10 months  Plan Continue proloastin Continue spiriva Respect no IPF antifibrotics or IPF trial per your wish  Followup Routine visit in 6 months; CAT Score and simple (not 77mwd) at followup Spiro/dlco at followup    Dr. Brand Males, M.D., [facility].C.P Pulmonary and Critical Care Medicine Staff Physician, [place] Director - Interstitial Lung Disease  Program  Pulmonary [place] [facility] [place], [place], [address]  Pager: [phone], If no answer or between  15:00h - 7:00h: call [phone] Telephone: [phone]

## 2018-07-09 ENCOUNTER — Encounter: Payer: Self-pay | Admitting: Nurse Practitioner

## 2018-07-24 DIAGNOSIS — H40001 Preglaucoma, unspecified, right eye: Secondary | ICD-10-CM | POA: Diagnosis not present

## 2018-07-24 DIAGNOSIS — H40011 Open angle with borderline findings, low risk, right eye: Secondary | ICD-10-CM | POA: Diagnosis not present

## 2018-07-24 DIAGNOSIS — H401121 Primary open-angle glaucoma, left eye, mild stage: Secondary | ICD-10-CM | POA: Diagnosis not present

## 2018-07-24 DIAGNOSIS — H47232 Glaucomatous optic atrophy, left eye: Secondary | ICD-10-CM | POA: Diagnosis not present

## 2018-07-24 DIAGNOSIS — H353130 Nonexudative age-related macular degeneration, bilateral, stage unspecified: Secondary | ICD-10-CM | POA: Diagnosis not present

## 2018-07-24 DIAGNOSIS — Z961 Presence of intraocular lens: Secondary | ICD-10-CM | POA: Diagnosis not present

## 2018-07-24 DIAGNOSIS — H52223 Regular astigmatism, bilateral: Secondary | ICD-10-CM | POA: Diagnosis not present

## 2018-07-24 DIAGNOSIS — H5203 Hypermetropia, bilateral: Secondary | ICD-10-CM | POA: Diagnosis not present

## 2018-07-27 ENCOUNTER — Ambulatory Visit (INDEPENDENT_AMBULATORY_CARE_PROVIDER_SITE_OTHER): Payer: PPO | Admitting: Nurse Practitioner

## 2018-07-27 ENCOUNTER — Other Ambulatory Visit (INDEPENDENT_AMBULATORY_CARE_PROVIDER_SITE_OTHER): Payer: PPO

## 2018-07-27 ENCOUNTER — Encounter: Payer: Self-pay | Admitting: Nurse Practitioner

## 2018-07-27 VITALS — BP 154/68 | HR 84 | Ht 67.5 in | Wt 214.5 lb

## 2018-07-27 DIAGNOSIS — R1010 Upper abdominal pain, unspecified: Secondary | ICD-10-CM | POA: Diagnosis not present

## 2018-07-27 LAB — LIPASE: LIPASE: 36 U/L (ref 11.0–59.0)

## 2018-07-27 LAB — COMPREHENSIVE METABOLIC PANEL
ALBUMIN: 4.2 g/dL (ref 3.5–5.2)
ALT: 29 U/L (ref 0–35)
AST: 25 U/L (ref 0–37)
Alkaline Phosphatase: 112 U/L (ref 39–117)
BILIRUBIN TOTAL: 0.3 mg/dL (ref 0.2–1.2)
BUN: 21 mg/dL (ref 6–23)
CALCIUM: 9.5 mg/dL (ref 8.4–10.5)
CO2: 25 mEq/L (ref 19–32)
CREATININE: 1.28 mg/dL — AB (ref 0.40–1.20)
Chloride: 105 mEq/L (ref 96–112)
GFR: 43.79 mL/min — ABNORMAL LOW (ref >60.00)
Glucose, Bld: 212 mg/dL — ABNORMAL HIGH (ref 70–99)
Potassium: 4.4 mEq/L (ref 3.5–5.1)
SODIUM: 140 meq/L (ref 135–145)
TOTAL PROTEIN: 7.3 g/dL (ref 6.0–8.3)

## 2018-07-27 LAB — CBC
HCT: 39.1 % (ref 36.0–46.0)
Hemoglobin: 13 g/dL (ref 12.0–15.0)
MCHC: 33.3 g/dL (ref 30.0–36.0)
MCV: 87.5 fl (ref 78.0–100.0)
Platelets: 300 10*3/uL (ref 150.0–400.0)
RBC: 4.47 Mil/uL (ref 3.87–5.11)
RDW: 13.3 % (ref 11.5–15.5)
WBC: 10.7 10*3/uL — AB (ref 4.0–10.5)

## 2018-07-27 NOTE — Progress Notes (Signed)
GI Provider: previously Delfin Edis, MD             Chief Complaint: abdominal pain   ASSESSMENT AND PLAN;   26. 72 yo female with hx of GERD / remote Nissen fundoplication. She presents with chronic intermittent epigastric pain, often exacerbated by eating. Some associated nausea. No recurrent GERD sx. Etiology of pain unclear, there does seem to be a musculoskeletal component to her pain but need to rule out other etiologies.  -No recent lab work. Will obtain lipase, serum chemistries, CBC -If above studies unrevealing then consider Surgical evaluation to rule out adhesions / mesh related discomfort.   2. Colon cancer screening.  Up to date on screening. She had a complete colonoscopy with good bowel prep in 2014.  No cancers nor polyps.  No history of colon cancer in primary relative.   HPI:    72 yo female with a hx of idiopathic pulmonary fibrosis / COPD / OSA, diverticulitis, GERD / hx of Nissen fundoplication, . She is s/p partial hysterectomy and appendectomy, ventral hernia repair and multiple back surgeries.   Two years patient suddenly developed upper abdoiminal apain. She thought the sudden pain was related to the fundoplication and went to see her Surgeon. Apparently told fundoplication was fine but that she had a "small tear in mesh used for hernia repair".  Pain has never resolved, it has been getting worse. Surgeon retired so she hasn't returned to [facility], not even to see someone else in the practice. The pain is intermittent, located in epigastrium, generally doesn't radiate through to her back. Eating does seem to exacerbate the pain. She has occasional nausea, doesn't vomit because of the fundoplication. Rarely takes NSAIDS. Biofeeze sometimes helps the pain. Weight overall stable.  In the last few months she has also developed blood in urine. Saw Urology but says nothing was found. She feels certain blood is not vaginal nor rectal.      Past Medical History:  Diagnosis  Date  . Alpha 1-antitrypsin PiMS phenotype   . Anemia 2014   mild  . Anxiety   . Arthritis   . Chronic kidney disease    early stages of some insufficiency  . Complication of anesthesia    passes out 24 hrs after getting  . COPD (chronic obstructive pulmonary disease) ([place])   . DDD (degenerative disc disease)   . Depression    with anxiety  . Diabetes mellitus   . Diverticulitis   . Diverticulosis   . Esophageal dysmotility 03/01/06   mild  . Fibromyalgia   . GERD (gastroesophageal reflux disease)   . Hypercholesterolemia   . Hypertension   . Shingles   . Sleep apnea    wears CPAP  . Stroke [facility]) 2010   3 tia's     Past Surgical History:  Procedure Laterality Date  . ABDOMINAL HYSTERECTOMY    . ANTERIOR FUSION CERVICAL SPINE    . APPENDECTOMY    . BACK SURGERY    . CARPAL TUNNEL RELEASE Bilateral   . DE QUERVAIN'S RELEASE     right  . GASTRIC FUNDOPLICATION    . HERNIA REPAIR    . KNEE ARTHROSCOPY     left   . LUNG BIOPSY Left 03/23/2016   Procedure: LUNG BIOPSY;  Surgeon: Melrose Nakayama, MD;  Location: [facility];  Service: Thoracic;  Laterality: Left;  . LYMPH NODE BIOPSY Left 03/23/2016   Procedure: LYMPH NODE BIOPSY;  Surgeon: Melrose Nakayama, MD;  Location: [facility];  Service: Thoracic;  Laterality: Left;  . POSTERIOR CERVICAL FUSION/FORAMINOTOMY N/A 03/16/2015   Procedure: POSTERIOR CERVICAL LAMINECTOMY AND DISKECTOMOY AT CERVICAL SEVEN-THORACIC ONE ;POSTERIOR CERVICAL FUSION CERVICAL SIX TO THORACIC ONE;  Surgeon: Kary Kos, MD;  Location: [facility];  Service: Neurosurgery;  Laterality: N/A;  Site is Cervical/Thoracic  . TONSILLECTOMY    . VIDEO ASSISTED THORACOSCOPY Left 03/23/2016   Procedure: VIDEO ASSISTED THORACOSCOPY;  Surgeon: Melrose Nakayama, MD;  Location: [facility];  Service: Thoracic;  Laterality: Left;   Family History  Problem Relation Age of Onset  . Cancer Mother        brain  . Lung cancer Mother   . COPD Father   . Stomach cancer  Maternal Grandfather   . Colon cancer Maternal Aunt    Social History   Tobacco Use  . Smoking status: Former Smoker    Packs/day: 1.00    Years: 22.00    Pack years: 22.00    Types: Cigarettes    Last attempt to quit: 04/14/2012    Years since quitting: 6.2  . Smokeless tobacco: Never Used  . Tobacco comment: Quit 1 year ago in June 2013.    Substance Use Topics  . Alcohol use: No    Alcohol/week: 0.0 standard drinks  . Drug use: No   Current Outpatient Medications  Medication Sig Dispense Refill  . acetaminophen (TYLENOL) 325 MG tablet Take 650 mg by mouth every 6 (six) hours as needed (joint pain/swelling).    Marland Kitchen albuterol (PROVENTIL) (2.5 MG/3ML) 0.083% nebulizer solution Take 3 mLs (2.5 mg total) by nebulization every 6 (six) hours as needed for wheezing or shortness of breath. 900 mL 1  . atorvastatin (LIPITOR) 40 MG tablet Take 1 tablet by mouth daily.  11  . brimonidine (ALPHAGAN) 0.15 % ophthalmic solution Place 1 drop into both eyes 3 (three) times daily.  3  . buPROPion (WELLBUTRIN XL) 300 MG 24 hr tablet Take 1 tablet by mouth every morning.    . cyclobenzaprine (FLEXERIL) 10 MG tablet Take 10 mg by mouth at bedtime as needed for muscle spasms.   0  . glipiZIDE (GLUCOTROL XL) 5 MG 24 hr tablet TK 1 T PO QD  11  . hydrochlorothiazide (MICROZIDE) 12.5 MG capsule Take 1 capsule (12.5 mg total) by mouth daily. 90 capsule 1  . latanoprost (XALATAN) 0.005 % ophthalmic solution 1 drop at bedtime. Left eye    . losartan (COZAAR) 25 MG tablet TAKE 1 TABLET(25 MG) BY MOUTH DAILY 90 tablet 1  . Menthol, Topical Analgesic, (BIOFREEZE) 4 % GEL Apply 1 application topically 3 (three) times daily as needed (for back pain).    . metFORMIN (GLUCOPHAGE-XR) 500 MG 24 hr tablet TK 2 TS PO BID  11  . metoprolol succinate (TOPROL-XL) 25 MG 24 hr tablet TAKE 1 TABLET(25 MG) BY MOUTH DAILY 90 tablet 1  . Multiple Vitamin (MULTIVITAMIN) capsule Take 1 capsule by mouth daily.    . ondansetron  (ZOFRAN ODT) 4 MG disintegrating tablet Take 1 tablet (4 mg total) by mouth every 8 (eight) hours as needed for nausea or vomiting. 10 tablet 0  . PROLASTIN-C 1000 MG SOLR injection Inject 1,000 mg into the skin once a week.    . promethazine (PHENERGAN) 25 MG tablet Take 1 tablet (25 mg total) by mouth every 6 (six) hours as needed for nausea or vomiting. 12 tablet 0  . sertraline (ZOLOFT) 50 MG tablet Take 50 mg by mouth  daily.    Marland Kitchen SPIRIVA HANDIHALER 18 MCG inhalation capsule PLACE 1 CAPSULE INTO INHALER AND INHALE DAILY 30 capsule 5  . traMADol (ULTRAM) 50 MG tablet Take 1-2 tablets (50-100 mg total) by mouth every 6 (six) hours as needed (mild pain). 30 tablet 0   No current facility-administered medications for this visit.    Allergies  Allergen Reactions  . Codeine Nausea And Vomiting  . Iohexol      Desc: Pt states several years ago during a CT scan w/ iv cotnrast she had severe nausea and vomiting, sob w/ difficulty breathing and swallowing.  She had full 13hr. premeds today (5/14/7) and did fine w/o complications.   . Tizanidine Other (See Comments)    Severe decrease in BP   . Betadine [Povidone Iodine] Rash  . Ciprofloxacin Rash     Review of Systems: For back pain, blood in urine, vision changes, cough, fatigue, shortness of breath and excessive thirst.  All other systems reviewed and negative except where noted in HPI.   Creatinine clearance cannot be calculated (Patient's most recent lab result is older than the maximum 21 days allowed.)   Physical Exam:    Wt Readings from Last 3 Encounters:  07/27/18 214 lb 8 oz (97.3 kg)  07/05/18 213 lb 6.4 oz (96.8 kg)  02/19/18 220 lb (99.8 kg)    BP (!) 154/68   Pulse 84   Ht 5' 7.5" (1.715 m)   Wt 214 lb 8 oz (97.3 kg)   LMP  (LMP Unknown)   BMI 33.10 kg/m  Constitutional:  Pleasant female in no acute distress. Psychiatric: Normal mood and affect. Behavior is normal. EENT: Pupils normal.  Conjunctivae are normal. No  scleral icterus. Neck supple.  Cardiovascular: Normal rate, regular rhythm. No edema Pulmonary/chest: Effort normal and breath sounds normal. No wheezing, rales or rhonchi. Abdominal: Soft, nondistended, nontender. Bowel sounds active throughout. There are no masses palpable. No hepatomegaly. Neurological: Alert and oriented to person place and time. Skin: Skin is warm and dry. No rashes noted.  Tye Savoy, NP  07/27/2018, 11:29 AM

## 2018-07-27 NOTE — Patient Instructions (Signed)
If you are age 70 or older, your body mass index should be between 23-30. Your Body mass index is 33.1 kg/m. If this is out of the aforementioned range listed, please consider follow up with your Primary Care Provider.  If you are age 34 or younger, your body mass index should be between 19-25. Your Body mass index is 33.1 kg/m. If this is out of the aformentioned range listed, please consider follow up with your Primary Care Provider.   You have been scheduled for an endoscopy. Please follow written instructions given to you at your visit today. If you use inhalers (even only as needed), please bring them with you on the day of your procedure. Your physician has requested that you go to www.startemmi.com and enter the access code given to you at your visit today. This web site gives a general overview about your procedure. However, you should still follow specific instructions given to you by our office regarding your preparation for the procedure.  Your provider has requested that you go to the basement level for lab work before leaving today. Press "B" on the elevator. The lab is located at the first door on the left as you exit the elevator.  Thank you for choosing me and [facility].   Tye Savoy, NP

## 2018-07-30 ENCOUNTER — Encounter: Payer: Self-pay | Admitting: Nurse Practitioner

## 2018-07-30 NOTE — Progress Notes (Signed)
Agree with assessment and plan 

## 2018-08-02 ENCOUNTER — Encounter: Payer: Self-pay | Admitting: Gastroenterology

## 2018-08-02 ENCOUNTER — Ambulatory Visit (AMBULATORY_SURGERY_CENTER): Payer: PPO | Admitting: Gastroenterology

## 2018-08-02 VITALS — BP 141/78 | HR 58 | Temp 98.4°F | Resp 13 | Ht 67.0 in | Wt 214.0 lb

## 2018-08-02 DIAGNOSIS — K2970 Gastritis, unspecified, without bleeding: Secondary | ICD-10-CM

## 2018-08-02 DIAGNOSIS — K2950 Unspecified chronic gastritis without bleeding: Secondary | ICD-10-CM | POA: Diagnosis not present

## 2018-08-02 DIAGNOSIS — K2990 Gastroduodenitis, unspecified, without bleeding: Secondary | ICD-10-CM | POA: Diagnosis not present

## 2018-08-02 DIAGNOSIS — J449 Chronic obstructive pulmonary disease, unspecified: Secondary | ICD-10-CM | POA: Diagnosis not present

## 2018-08-02 DIAGNOSIS — I10 Essential (primary) hypertension: Secondary | ICD-10-CM | POA: Diagnosis not present

## 2018-08-02 DIAGNOSIS — E119 Type 2 diabetes mellitus without complications: Secondary | ICD-10-CM | POA: Diagnosis not present

## 2018-08-02 DIAGNOSIS — K3189 Other diseases of stomach and duodenum: Secondary | ICD-10-CM | POA: Diagnosis not present

## 2018-08-02 DIAGNOSIS — R1010 Upper abdominal pain, unspecified: Secondary | ICD-10-CM

## 2018-08-02 DIAGNOSIS — M797 Fibromyalgia: Secondary | ICD-10-CM | POA: Diagnosis not present

## 2018-08-02 DIAGNOSIS — G4733 Obstructive sleep apnea (adult) (pediatric): Secondary | ICD-10-CM | POA: Diagnosis not present

## 2018-08-02 MED ORDER — SODIUM CHLORIDE 0.9 % IV SOLN: 500.0000 mL | Freq: Once | INTRAVENOUS | Status: DC

## 2018-08-02 MED ORDER — OMEPRAZOLE 40 MG PO CPDR: 40.0000 mg | DELAYED_RELEASE_CAPSULE | Freq: Two times a day (BID) | ORAL | 3 refills | Status: DC

## 2018-08-02 NOTE — Progress Notes (Signed)
Report to PACU, RN, vss, BBS= Clear.  

## 2018-08-02 NOTE — Patient Instructions (Signed)
Handout given on gastritis. Take Omeprazole 40mg  twice daily for next 8 weeks, then transition to 40mg  daily thereafter. Plan to follow-up in clinic/office around the 2 month mark.  Minimize NSAID use (take Tylenol/Acetaminophen instead).  YOU HAD AN ENDOSCOPIC PROCEDURE TODAY AT [facility]:   Refer to the procedure report that was given to you for any specific questions about what was found during the examination.  If the procedure report does not answer your questions, please call your gastroenterologist to clarify.  If you requested that your care partner not be given the details of your procedure findings, then the procedure report has been included in a sealed envelope for you to review at your convenience later.  YOU SHOULD EXPECT: Some feelings of bloating in the abdomen. Passage of more gas than usual.  Walking can help get rid of the air that was put into your GI tract during the procedure and reduce the bloating. If you had a lower endoscopy (such as a colonoscopy or flexible sigmoidoscopy) you may notice spotting of blood in your stool or on the toilet paper. If you underwent a bowel prep for your procedure, you may not have a normal bowel movement for a few days.  Please Note:  You might notice some irritation and congestion in your nose or some drainage.  This is from the oxygen used during your procedure.  There is no need for concern and it should clear up in a day or so.  SYMPTOMS TO REPORT IMMEDIATELY:    Following upper endoscopy (EGD)  Vomiting of blood or coffee ground material  New chest pain or pain under the shoulder blades  Painful or persistently difficult swallowing  New shortness of breath  Fever of 100F or higher  Black, tarry-looking stools  For urgent or emergent issues, a gastroenterologist can be reached at any hour by calling [phone].   DIET:  We do recommend a small meal at first, but then you may proceed to your regular diet.  Drink  plenty of fluids but you should avoid alcoholic beverages for 24 hours.  ACTIVITY:  You should plan to take it easy for the rest of today and you should NOT DRIVE or use heavy machinery until tomorrow (because of the sedation medicines used during the test).    FOLLOW UP: Our staff will call the number listed on your records the next business day following your procedure to check on you and address any questions or concerns that you may have regarding the information given to you following your procedure. If we do not reach you, we will leave a message.  However, if you are feeling well and you are not experiencing any problems, there is no need to return our call.  We will assume that you have returned to your regular daily activities without incident.  If any biopsies were taken you will be contacted by phone or by letter within the next 1-3 weeks.  Please call us at [phone] if you have not heard about the biopsies in 3 weeks.    SIGNATURES/CONFIDENTIALITY: You and/or your care partner have signed paperwork which will be entered into your electronic medical record.  These signatures attest to the fact that that the information above on your After Visit Summary has been reviewed and is understood.  Full responsibility of the confidentiality of this discharge information lies with you and/or your care-partner.

## 2018-08-02 NOTE — Op Note (Signed)
[facility] Patient Name: Angela [name] Procedure Date: 08/02/2018 7:48 AM MRN: [id number] Endoscopist: Justice Britain , MD Age: 72 Referring MD:  Date of Birth: 10/18/1946 Gender: Female Account #: [id number] Procedure:                Upper GI endoscopy Indications:              Diagnostic procedure, Epigastric abdominal pain,                            Abdominal pain in the left upper quadrant,                            Follow-up of esophageal reflux Medicines:                Monitored Anesthesia Care Procedure:                Pre-Anesthesia Assessment:                           - Prior to the procedure, a History and Physical                            was performed, and patient medications and                            allergies were reviewed. The patient's tolerance of                            previous anesthesia was also reviewed. The risks                            and benefits of the procedure and the sedation                            options and risks were discussed with the patient.                            All questions were answered, and informed consent                            was obtained. Prior Anticoagulants: The patient has                            taken no previous anticoagulant or antiplatelet                            agents. ASA Grade Assessment: III - A patient with                            severe systemic disease. After reviewing the risks                            and benefits, the patient was deemed in  satisfactory condition to undergo the procedure.                           After obtaining informed consent, the endoscope was                            passed under direct vision. Throughout the                            procedure, the patient's blood pressure, pulse, and                            oxygen saturations were monitored continuously. The                            Model GIF-HQ190 [id number])  scope was introduced                            through the mouth, and advanced to the second part                            of duodenum. The upper GI endoscopy was                            accomplished without difficulty. The patient                            tolerated the procedure well. Scope In: Scope Out: Findings:                 No gross lesions were noted in the entire                            esophagus. Z-line at 40 cm.                           Multiple dispersed, small non-bleeding erosions                            were found in the cardia, in the gastric body, at                            the incisura and in the gastric antrum. There were                            no stigmata of recent bleeding.                           Evidence of a prior Nissen fundoplication was found                            in the cardia and in the gastric fundus. This was  characterized by healthy appearing mucosa.                           No gross lesions were noted in the entire examined                            stomach otherwise. Biopsies were taken with a cold                            forceps for histology and Helicobacter pylori                            testing from the antrum/incisura/greater                            curve/lesser curve/cardia/fundus.                           Multiple dispersed, small erosions without bleeding                            were found in the duodenal bulb and in the D1/D2                            angle.                           No other gross lesions were noted in the second                            portion of the duodenum. Biopsies were taken with a                            cold forceps for histology from the bulb and D1/D2                            angle and D2. Complications:            No immediate complications. Estimated Blood Loss:     Estimated blood loss was minimal. Impression:               - No gross  lesions in esophagus. Z-line at 40 cm.                           - Non-bleeding erosive gastropathy. A Nissen                            fundoplication was found, characterized by healthy                            appearing mucosa. No other gross lesions in the                            stomach. Biopsied stomach for HP evaluation.                           -  Erosive duodenopathy without bleeding in bulb and                            D1/D2 angle. Otherwise, no gross lesions in the                            second portion of the duodenum. Biopsied to rule                            out enteropathy. Recommendation:           - The patient will be observed post-procedure,                            until all discharge criteria are met.                           - Discharge patient to home.                           - Patient has a contact number available for                            emergencies. The signs and symptoms of potential                            delayed complications were discussed with the                            patient. Return to normal activities tomorrow.                            Written discharge instructions were provided to the                            patient.                           - Resume previous diet.                           - Continue present medications.                           - Start Omeprazole 40 mg twice daily for next                            8-weeks, then transition to 40 mg daily thereafter                            with plan to follow up in clinic (Rx sent to                            pharmacy).                           -  Minimize NSAID use (though none on patient's                            medication chart are noted) and if necessary should                            remain on PPI daily.                           - Patient's post-prandial discomfort and pain may                            have some association with the  gastropathy and                            duodenopathy found today. However, the patient's                            concern and prior PA evaluation, does suggest the                            possibility of the discomfort being/having a MSK                            component and from prior hernia mesh issues. Will                            continue to follow. Will also consider the role of                            an earlier colonsocopy (2018 had a CT scan showing                            thickening in the region of the [facility] (though had a                            Colonoscopy in 2014 that showed tortuous                            diverticula in region and no other issues, so would                            keep that in mind should earlier colonscopy be                            required).                           - The findings and recommendations were discussed                            with the patient.                           -  The findings and recommendations were discussed                            with the patient's family. Justice Britain, MD 08/02/2018 8:16:03 AM

## 2018-08-02 NOTE — Progress Notes (Signed)
Called to room to assist during endoscopic procedure.  Patient ID and intended procedure confirmed with present staff. Received instructions for my participation in the procedure from the performing physician.  

## 2018-08-02 NOTE — Progress Notes (Signed)
Pt states she is passing blood in urine for about 3 months- she states that her family MD is aware.  Pt has a hx of IPF- CRNA made aware of this

## 2018-08-03 ENCOUNTER — Telehealth: Payer: Self-pay

## 2018-08-03 NOTE — Telephone Encounter (Signed)
  Follow up Call-  Call back number 08/02/2018  Post procedure Call Back phone  # [phone]  Permission to leave phone message Yes  Some recent data might be hidden     Patient questions:  Do you have a fever, pain , or abdominal swelling? No. Pain Score  0 *  Have you tolerated food without any problems? Yes.    Have you been able to return to your normal activities? Yes.    Do you have any questions about your discharge instructions: Diet   No. Medications  No. Follow up visit  No.  Do you have questions or concerns about your Care? No.  Actions: * If pain score is 4 or above: No action needed, pain <4.

## 2018-08-06 ENCOUNTER — Encounter (INDEPENDENT_AMBULATORY_CARE_PROVIDER_SITE_OTHER): Payer: PPO | Admitting: Ophthalmology

## 2018-08-06 DIAGNOSIS — H43813 Vitreous degeneration, bilateral: Secondary | ICD-10-CM

## 2018-08-06 DIAGNOSIS — H353132 Nonexudative age-related macular degeneration, bilateral, intermediate dry stage: Secondary | ICD-10-CM | POA: Diagnosis not present

## 2018-08-06 DIAGNOSIS — H35033 Hypertensive retinopathy, bilateral: Secondary | ICD-10-CM

## 2018-08-06 DIAGNOSIS — I10 Essential (primary) hypertension: Secondary | ICD-10-CM | POA: Diagnosis not present

## 2018-08-06 DIAGNOSIS — H27132 Posterior dislocation of lens, left eye: Secondary | ICD-10-CM

## 2018-08-07 ENCOUNTER — Encounter: Payer: Self-pay | Admitting: Gastroenterology

## 2018-08-22 ENCOUNTER — Telehealth: Payer: Self-pay

## 2018-08-22 NOTE — Telephone Encounter (Signed)
-----   Message from Willia Craze, NP sent at 08/22/2018  1:33 PM EDT ----- EGD results noted. Beth, please see if still having the abdominal pain. Did she ever follow up with Surgery?  Also, she should come in for follow up with me or [facility]. Thanks  ----- Message ----- From: Irving Copas., MD Sent: 08/07/2018   3:50 AM EDT To: Willia Craze, NP  Nevin Bloodgood, Lots of small erosions and looks like only chronic inflammation. I started her on PPI therapy. Maybe that helps, but agree unlikely. She had a CT in 2018 that showed some thickening in region of her known diverticulosis, but with imaging finding changes, could consider an earlier colonoscopy. I think that seeing a surgeon and getting an earlier colonoscopy if you see her back before me is the most reasonable approach. Thanks. Gabe ----- Message ----- From: Willia Craze, NP Sent: 08/06/2018   2:22 PM EDT To: Irving Copas., MD  Gabe, Labs unrevealing. Gastric erosions on EGD probably not causing her pain. Unless you have anything else to add then I am asking patient to see her Surgeon.  She had same pain a couple of years ago, says Surgeon told her then that there may be a small tear in mesh??.   Thanks Nevin Bloodgood

## 2018-08-22 NOTE — Telephone Encounter (Signed)
Left a message for the patient to return my call.  

## 2018-09-07 NOTE — Telephone Encounter (Signed)
Left message for the patient. Asked her to let us know how she is doing, is she having any further abdominal pain and let call us for an appointment.

## 2018-10-17 ENCOUNTER — Telehealth: Payer: Self-pay | Admitting: Gastroenterology

## 2018-10-17 NOTE — Telephone Encounter (Signed)
Pt was returning your calls, she stated that she has been sick and is finally feeling better. Pls call her, she would like to speak with you regarding sxs with her hernia.

## 2018-10-17 NOTE — Telephone Encounter (Signed)
She is ready to discuss surgery with the surgeon for the hernia. She reports she has "a belly button poking out at me."  Records faxed to [facility].

## 2018-10-23 DIAGNOSIS — H401121 Primary open-angle glaucoma, left eye, mild stage: Secondary | ICD-10-CM | POA: Diagnosis not present

## 2018-10-23 DIAGNOSIS — H47232 Glaucomatous optic atrophy, left eye: Secondary | ICD-10-CM | POA: Diagnosis not present

## 2018-10-23 DIAGNOSIS — H04123 Dry eye syndrome of bilateral lacrimal glands: Secondary | ICD-10-CM | POA: Diagnosis not present

## 2018-10-23 DIAGNOSIS — Z961 Presence of intraocular lens: Secondary | ICD-10-CM | POA: Diagnosis not present

## 2018-10-23 DIAGNOSIS — H40011 Open angle with borderline findings, low risk, right eye: Secondary | ICD-10-CM | POA: Diagnosis not present

## 2018-10-23 DIAGNOSIS — H40001 Preglaucoma, unspecified, right eye: Secondary | ICD-10-CM | POA: Diagnosis not present

## 2018-10-23 DIAGNOSIS — H1045 Other chronic allergic conjunctivitis: Secondary | ICD-10-CM | POA: Diagnosis not present

## 2018-10-23 DIAGNOSIS — H18413 Arcus senilis, bilateral: Secondary | ICD-10-CM | POA: Diagnosis not present

## 2018-10-23 DIAGNOSIS — H11153 Pinguecula, bilateral: Secondary | ICD-10-CM | POA: Diagnosis not present

## 2018-10-23 DIAGNOSIS — H353131 Nonexudative age-related macular degeneration, bilateral, early dry stage: Secondary | ICD-10-CM | POA: Diagnosis not present

## 2018-12-16 ENCOUNTER — Emergency Department (HOSPITAL_COMMUNITY)
Admission: EM | Admit: 2018-12-16 | Discharge: 2018-12-16 | Disposition: A | Discharge status: Home / Self Care | Payer: PPO | Attending: Emergency Medicine | Admitting: Emergency Medicine

## 2018-12-16 ENCOUNTER — Emergency Department (HOSPITAL_COMMUNITY): Payer: PPO

## 2018-12-16 ENCOUNTER — Encounter (HOSPITAL_COMMUNITY): Payer: Self-pay | Admitting: Emergency Medicine

## 2018-12-16 ENCOUNTER — Other Ambulatory Visit: Payer: Self-pay

## 2018-12-16 DIAGNOSIS — R110 Nausea: Secondary | ICD-10-CM | POA: Insufficient documentation

## 2018-12-16 DIAGNOSIS — H81399 Other peripheral vertigo, unspecified ear: Secondary | ICD-10-CM | POA: Insufficient documentation

## 2018-12-16 DIAGNOSIS — N289 Disorder of kidney and ureter, unspecified: Secondary | ICD-10-CM | POA: Diagnosis not present

## 2018-12-16 DIAGNOSIS — Z87891 Personal history of nicotine dependence: Secondary | ICD-10-CM | POA: Insufficient documentation

## 2018-12-16 DIAGNOSIS — Z7984 Long term (current) use of oral hypoglycemic drugs: Secondary | ICD-10-CM | POA: Insufficient documentation

## 2018-12-16 DIAGNOSIS — Z79899 Other long term (current) drug therapy: Secondary | ICD-10-CM | POA: Insufficient documentation

## 2018-12-16 DIAGNOSIS — E119 Type 2 diabetes mellitus without complications: Secondary | ICD-10-CM | POA: Diagnosis not present

## 2018-12-16 DIAGNOSIS — R42 Dizziness and giddiness: Secondary | ICD-10-CM | POA: Diagnosis present

## 2018-12-16 DIAGNOSIS — J449 Chronic obstructive pulmonary disease, unspecified: Secondary | ICD-10-CM | POA: Diagnosis not present

## 2018-12-16 DIAGNOSIS — I10 Essential (primary) hypertension: Secondary | ICD-10-CM | POA: Insufficient documentation

## 2018-12-16 LAB — BASIC METABOLIC PANEL
Anion gap: 12 (ref 5–15)
BUN: 20 mg/dL (ref 8–23)
CO2: 22 mmol/L (ref 22–32)
Calcium: 9.2 mg/dL (ref 8.9–10.3)
Chloride: 102 mmol/L (ref 98–111)
Creatinine, Ser: 1.2 mg/dL — ABNORMAL HIGH (ref 0.44–1.00)
GFR calc Af Amer: 53 mL/min — ABNORMAL LOW (ref >60)
GFR calc non Af Amer: 46 mL/min — ABNORMAL LOW (ref >60)
Glucose, Bld: 130 mg/dL — ABNORMAL HIGH (ref 70–99)
Potassium: 4.3 mmol/L (ref 3.5–5.1)
Sodium: 136 mmol/L (ref 135–145)

## 2018-12-16 LAB — CBC WITH DIFFERENTIAL/PLATELET
Abs Immature Granulocytes: 0.03 10*3/uL (ref 0.00–0.07)
Basophils Absolute: 0.1 10*3/uL (ref 0.0–0.1)
Basophils Relative: 1 %
EOS ABS: 0.3 10*3/uL (ref 0.0–0.5)
EOS PCT: 3 %
HEMATOCRIT: 35.4 % — AB (ref 36.0–46.0)
Hemoglobin: 11 g/dL — ABNORMAL LOW (ref 12.0–15.0)
Immature Granulocytes: 0 %
Lymphocytes Relative: 19 %
Lymphs Abs: 2.2 10*3/uL (ref 0.7–4.0)
MCH: 27.6 pg (ref 26.0–34.0)
MCHC: 31.1 g/dL (ref 30.0–36.0)
MCV: 88.7 fL (ref 80.0–100.0)
MONOS PCT: 7 %
Monocytes Absolute: 0.8 10*3/uL (ref 0.1–1.0)
NRBC: 0 % (ref 0.0–0.2)
Neutro Abs: 8.3 10*3/uL — ABNORMAL HIGH (ref 1.7–7.7)
Neutrophils Relative %: 70 %
Platelets: 213 10*3/uL (ref 150–400)
RBC: 3.99 MIL/uL (ref 3.87–5.11)
RDW: 13.5 % (ref 11.5–15.5)
WBC: 11.8 10*3/uL — ABNORMAL HIGH (ref 4.0–10.5)

## 2018-12-16 LAB — I-STAT TROPONIN, ED
Troponin i, poc: 0 ng/mL (ref 0.00–0.08)
Troponin i, poc: 0 ng/mL (ref 0.00–0.08)

## 2018-12-16 MED ORDER — MECLIZINE HCL 12.5 MG PO TABS: 12.5000 mg | ORAL_TABLET | Freq: Three times a day (TID) | ORAL | 0 refills | Status: AC | PRN

## 2018-12-16 MED ORDER — MECLIZINE HCL 25 MG PO TABS
25.0000 mg | ORAL_TABLET | Freq: Once | ORAL | Status: AC
  Administered 2018-12-16: 25 mg via ORAL
  Filled 2018-12-16: qty 1

## 2018-12-16 NOTE — ED Notes (Signed)
Patient verbalizes understanding of discharge instructions. Opportunity for questioning and answers were provided. Armband removed by staff, pt discharged from ED in wheelchair.  

## 2018-12-16 NOTE — ED Provider Notes (Signed)
[facility] DEPARTMENT Provider Note   CSN: [id number] Arrival date & time: 12/16/18  0235     History   Chief Complaint Chief Complaint  Patient presents with  . Hypertension    HPI Angela [name] is a 73 y.o. female.  The history is provided by the patient.  Hypertension  This is a chronic problem. The current episode started more than 1 week ago. The problem occurs constantly. The problem has been gradually worsening (did not take medication today). Pertinent negatives include no chest pain, no abdominal pain, no headaches and no shortness of breath. Nothing aggravates the symptoms. Nothing relieves the symptoms. She has tried nothing for the symptoms. The treatment provided moderate relief.  Dizziness  Quality:  Head spinning Severity:  Severe Onset quality:  Sudden Timing:  Constant Progression:  Unchanged Chronicity:  Recurrent Context: head movement   Context: not with inactivity and not with medication   Relieved by:  Nothing Worsened by:  Nothing Associated symptoms: nausea and tinnitus   Associated symptoms: no chest pain, no headaches, no shortness of breath and no weakness   Risk factors: hx of vertigo   Also is having vertigo w  Past Medical History:  Diagnosis Date  . Allergy   . Alpha 1-antitrypsin PiMS phenotype   . Anemia 2014   mild  . Anxiety   . Arthritis   . Asthma   . Blood transfusion without reported diagnosis   . Cataract   . Chronic kidney disease    early stages of some insufficiency  . Complication of anesthesia    passes out 24 hrs after getting  . COPD (chronic obstructive pulmonary disease) ([place])   . DDD (degenerative disc disease)   . Depression    with anxiety  . Diabetes mellitus   . Diverticulitis   . Diverticulosis   . Esophageal dysmotility 03/01/06   mild  . Fibromyalgia   . GERD (gastroesophageal reflux disease)   . Glaucoma   . Heart murmur    "years ago"  . Hypercholesterolemia   .  Hypertension   . IPF (idiopathic pulmonary fibrosis) ([place])   . Shingles   . Shortness of breath dyspnea   . Sleep apnea    wears CPAP  . Stroke [facility]) 2010   3 tia's    Patient Active Problem List   Diagnosis Date Noted  . Needs flu shot 09/13/2017  . Upper airway cough syndrome 11/15/2016  . Encounter for therapeutic drug monitoring 07/07/2016  . IPF (idiopathic pulmonary fibrosis) ([place]) 04/14/2016  . COPD exacerbation ([place]) 09/09/2015  . Alpha-1-antitrypsin deficiency ([place]) 09/09/2015  . Pulmonary emphysema with fibrosis of lung ([place]) 09/09/2015  . ILD (interstitial lung disease) ([place]) 09/09/2015  . Smoking history 08/11/2015  . Family history of alpha 1 antitrypsin deficiency 08/11/2015  . Dyspnea and respiratory abnormality 08/11/2015  . HNP (herniated nucleus pulposus) 03/16/2015  . Obesity 02/05/2014  . Internal hemorrhoid 08/23/2013  . RUQ abdominal pain 08/23/2013  . Nonspecific abnormal finding in stool contents 08/23/2013  . Abdominal pain, epigastric 06/03/2013  . Acute diverticulitis 05/29/2013  . Abdominal pain, left lower quadrant 05/29/2013  . Nausea and vomiting 05/29/2013  . Leukocytosis 05/29/2013  . Acute renal failure ([place]) 05/29/2013  . Anemia 05/29/2013  . Diabetes ([place]) 05/29/2013  . Dyslipidemia 05/29/2013  . HTN (hypertension) 04/14/2013    Past Surgical History:  Procedure Laterality Date  . ABDOMINAL HYSTERECTOMY    . ANTERIOR FUSION CERVICAL SPINE    .  APPENDECTOMY    . BACK SURGERY     x4  . CARPAL TUNNEL RELEASE Bilateral   . COLONOSCOPY    . DE QUERVAIN'S RELEASE     right  . GASTRIC FUNDOPLICATION    . HERNIA REPAIR    . KNEE ARTHROSCOPY     left   . LUNG BIOPSY Left 03/23/2016   Procedure: LUNG BIOPSY;  Surgeon: Melrose Nakayama, MD;  Location: [facility];  Service: Thoracic;  Laterality: Left;  . LYMPH NODE BIOPSY Left 03/23/2016   Procedure: LYMPH NODE BIOPSY;  Surgeon: Melrose Nakayama, MD;  Location: [facility];  Service:  Thoracic;  Laterality: Left;  . POSTERIOR CERVICAL FUSION/FORAMINOTOMY N/A 03/16/2015   Procedure: POSTERIOR CERVICAL LAMINECTOMY AND DISKECTOMOY AT CERVICAL SEVEN-THORACIC ONE ;POSTERIOR CERVICAL FUSION CERVICAL SIX TO THORACIC ONE;  Surgeon: Kary Kos, MD;  Location: [facility];  Service: Neurosurgery;  Laterality: N/A;  Site is Cervical/Thoracic  . TONSILLECTOMY    . UPPER GASTROINTESTINAL ENDOSCOPY    . VIDEO ASSISTED THORACOSCOPY Left 03/23/2016   Procedure: VIDEO ASSISTED THORACOSCOPY;  Surgeon: Melrose Nakayama, MD;  Location: [facility];  Service: Thoracic;  Laterality: Left;     OB History   No obstetric history on file.      Home Medications    Prior to Admission medications   Medication Sig Start Date End Date Taking? Authorizing Provider  acetaminophen (TYLENOL) 325 MG tablet Take 650 mg by mouth every 6 (six) hours as needed (joint pain/swelling).    [provider]  albuterol (PROVENTIL) (2.5 MG/3ML) 0.083% nebulizer solution Take 3 mLs (2.5 mg total) by nebulization every 6 (six) hours as needed for wheezing or shortness of breath. 07/05/18   Brand Males, MD  atorvastatin (LIPITOR) 40 MG tablet Take 1 tablet by mouth daily. 11/15/16   [provider]  brimonidine (ALPHAGAN) 0.15 % ophthalmic solution Place 1 drop into both eyes 3 (three) times daily. 07/12/17   [provider]  buPROPion (WELLBUTRIN XL) 300 MG 24 hr tablet Take 1 tablet by mouth every morning. 05/04/16   [provider]  cyclobenzaprine (FLEXERIL) 10 MG tablet Take 10 mg by mouth at bedtime as needed for muscle spasms.  06/09/15   [provider]  glipiZIDE (GLUCOTROL XL) 5 MG 24 hr tablet TK 1 T PO QD 11/21/17   [provider]  hydrochlorothiazide (MICROZIDE) 12.5 MG capsule Take 1 capsule (12.5 mg total) by mouth daily. 05/22/17   Jerline Pain, MD  latanoprost (XALATAN) 0.005 % ophthalmic solution 1 drop at bedtime. Left eye    [provider]    losartan (COZAAR) 25 MG tablet TAKE 1 TABLET(25 MG) BY MOUTH DAILY 09/09/15   Brand Males, MD  Menthol, Topical Analgesic, (BIOFREEZE) 4 % GEL Apply 1 application topically 3 (three) times daily as needed (for back pain).    [provider]  metFORMIN (GLUCOPHAGE-XR) 500 MG 24 hr tablet TK 2 TS PO BID 01/25/18   [provider]  metoprolol succinate (TOPROL-XL) 25 MG 24 hr tablet TAKE 1 TABLET(25 MG) BY MOUTH DAILY 05/22/17   Jerline Pain, MD  Multiple Vitamin (MULTIVITAMIN) capsule Take 1 capsule by mouth daily.    [provider]  omeprazole (PRILOSEC) 40 MG capsule Take 1 capsule (40 mg total) by mouth 2 (two) times daily before lunch and supper. 08/02/18   Mansouraty, Telford Nab., MD  ondansetron (ZOFRAN ODT) 4 MG disintegrating tablet Take 1 tablet (4 mg total) by mouth every  8 (eight) hours as needed for nausea or vomiting. 07/25/15   Little, Wenda Overland, MD  PROLASTIN-C 1000 MG SOLR injection Inject 1,000 mg into the skin once a week. 07/12/17   [provider]  promethazine (PHENERGAN) 25 MG tablet Take 1 tablet (25 mg total) by mouth every 6 (six) hours as needed for nausea or vomiting. 08/04/17   Virgel Manifold, MD  sertraline (ZOLOFT) 50 MG tablet Take 50 mg by mouth daily.    [provider]  SPIRIVA HANDIHALER 18 MCG inhalation capsule PLACE 1 CAPSULE INTO INHALER AND INHALE DAILY 10/25/17   Brand Males, MD  traMADol (ULTRAM) 50 MG tablet Take 1-2 tablets (50-100 mg total) by mouth every 6 (six) hours as needed (mild pain). 03/26/16   Barrett, Lodema Hong, PA-C    Family History Family History  Problem Relation Age of Onset  . Cancer Mother        brain  . Lung cancer Mother   . COPD Father   . Stomach cancer Maternal Grandfather   . Colon cancer Maternal Aunt   . Esophageal cancer Neg Hx   . Rectal cancer Neg Hx     Social History Social History   Tobacco Use  . Smoking status: Former Smoker    Packs/day: 1.00    Years:  22.00    Pack years: 22.00    Types: Cigarettes    Last attempt to quit: 04/14/2012    Years since quitting: 6.6  . Smokeless tobacco: Never Used  . Tobacco comment: Quit 1 year ago in June 2013.    Substance Use Topics  . Alcohol use: No    Alcohol/week: 0.0 standard drinks  . Drug use: No     Allergies   Codeine; Iohexol; Tizanidine; Betadine [povidone iodine]; and Ciprofloxacin   Review of Systems Review of Systems  HENT: Positive for tinnitus.   Eyes: Negative for photophobia and visual disturbance.  Respiratory: Negative for shortness of breath.   Cardiovascular: Negative for chest pain.  Gastrointestinal: Positive for nausea. Negative for abdominal pain.  Neurological: Positive for dizziness. Negative for tremors, seizures, syncope, facial asymmetry, speech difficulty, weakness, light-headedness, numbness and headaches.  All other systems reviewed and are negative.    Physical Exam Updated Vital Signs BP (!) 129/102   Pulse (!) 58   Temp 97.6 F (36.4 C) (Oral)   Resp 14   Ht 5\' 8"  (1.727 m)   Wt 95.7 kg   LMP  (LMP Unknown)   SpO2 96%   BMI 32.08 kg/m   Physical Exam Vitals signs and nursing note reviewed.  Constitutional:      General: She is not in acute distress.    Appearance: She is normal weight.  HENT:     Head: Normocephalic and atraumatic.     Right Ear: Tympanic membrane normal.     Ears:     Comments: Scarring of the right TM    Nose: Nose normal.     Mouth/Throat:     Mouth: Mucous membranes are moist.     Pharynx: Oropharynx is clear.  Eyes:     Extraocular Movements: Extraocular movements intact.     Conjunctiva/sclera: Conjunctivae normal.     Pupils: Pupils are equal, round, and reactive to light.  Cardiovascular:     Rate and Rhythm: Normal rate and regular rhythm.     Pulses: Normal pulses.     Heart sounds: Normal heart sounds.  Pulmonary:     Effort: Pulmonary effort is  normal.     Breath sounds: Normal breath sounds.    Abdominal:     General: Abdomen is flat. Bowel sounds are normal.     Tenderness: There is no abdominal tenderness.  Musculoskeletal: Normal range of motion.        General: No tenderness.     Right lower leg: No edema.     Left lower leg: No edema.  Skin:    General: Skin is warm and dry.     Capillary Refill: Capillary refill takes less than 2 seconds.  Neurological:     General: No focal deficit present.     Mental Status: She is alert and oriented to person, place, and time.     Cranial Nerves: No cranial nerve deficit.     Deep Tendon Reflexes: Reflexes normal.  Psychiatric:        Mood and Affect: Mood normal.        Behavior: Behavior normal.      ED Treatments / Results  Labs (all labs ordered are listed, but only abnormal results are displayed) Results for orders placed or performed during the hospital encounter of 12/16/18  CBC with Differential/Platelet  Result Value Ref Range   WBC 11.8 (H) 4.0 - 10.5 K/uL   RBC 3.99 3.87 - 5.11 MIL/uL   Hemoglobin 11.0 (L) 12.0 - 15.0 g/dL   HCT 35.4 (L) 36.0 - 46.0 %   MCV 88.7 80.0 - 100.0 fL   MCH 27.6 26.0 - 34.0 pg   MCHC 31.1 30.0 - 36.0 g/dL   RDW 13.5 11.5 - 15.5 %   Platelets 213 150 - 400 K/uL   nRBC 0.0 0.0 - 0.2 %   Neutrophils Relative % 70 %   Neutro Abs 8.3 (H) 1.7 - 7.7 K/uL   Lymphocytes Relative 19 %   Lymphs Abs 2.2 0.7 - 4.0 K/uL   Monocytes Relative 7 %   Monocytes Absolute 0.8 0.1 - 1.0 K/uL   Eosinophils Relative 3 %   Eosinophils Absolute 0.3 0.0 - 0.5 K/uL   Basophils Relative 1 %   Basophils Absolute 0.1 0.0 - 0.1 K/uL   Immature Granulocytes 0 %   Abs Immature Granulocytes 0.03 0.00 - 0.07 K/uL  Basic metabolic panel  Result Value Ref Range   Sodium 136 135 - 145 mmol/L   Potassium 4.3 3.5 - 5.1 mmol/L   Chloride 102 98 - 111 mmol/L   CO2 22 22 - 32 mmol/L   Glucose, Bld 130 (H) 70 - 99 mg/dL   BUN 20 8 - 23 mg/dL   Creatinine, Ser 1.20 (H) 0.44 - 1.00 mg/dL   Calcium 9.2 8.9 - 10.3  mg/dL   GFR calc non Af Amer 46 (L) >60 mL/min   GFR calc Af Amer 53 (L) >60 mL/min   Anion gap 12 5 - 15  I-stat troponin, ED  Result Value Ref Range   Troponin i, poc 0.00 0.00 - 0.08 ng/mL   Comment 3          I-stat troponin, ED  Result Value Ref Range   Troponin i, poc 0.00 0.00 - 0.08 ng/mL   Comment 3           Dg Chest 2 View  Result Date: 12/16/2018 CLINICAL DATA:  Hypertensive, forgot to take medication. Nausea and vomiting. History of COPD/interstitial lung disease. EXAM: CHEST - 2 VIEW COMPARISON:  Chest radiograph July 17, 2016 and CT chest June 22, 2017 FINDINGS: Cardiac silhouette is  mildly enlarged and unchanged. Chronic interstitial changes without pleural effusion or focal consolidation. No pneumothorax. Cervical spine hardware. Osteopenia. IMPRESSION: 1. Stable cardiomegaly and chronic interstitial lung disease. No focal consolidation. Electronically Signed   By: Elon Alas M.D.   On: 12/16/2018 03:38   Ct Head Wo Contrast  Result Date: 12/16/2018 CLINICAL DATA:  Vertigo and high blood pressure. EXAM: CT HEAD WITHOUT CONTRAST TECHNIQUE: Contiguous axial images were obtained from the base of the skull through the vertex without intravenous contrast. COMPARISON:  MRI brain 07/10/2014. CT head 12/26/2007 FINDINGS: Brain: No evidence of acute infarction, hemorrhage, hydrocephalus, extra-axial collection or mass lesion/mass effect. Vascular: Mild intracranial arterial vascular calcifications. Skull: Calvarium appears intact. Sinuses/Orbits: Paranasal sinuses and mastoid air cells are clear. Postoperative changes with left ostiomeatal windows. Other: No significant change since prior study. IMPRESSION: No acute intracranial abnormalities. Electronically Signed   By: Lucienne Capers M.D.   On: 12/16/2018 03:36    EKG EKG Interpretation  Date/Time:  Sunday December 16 2018 02:46:25 EST Ventricular Rate:  61 PR Interval:    QRS Duration: 97 QT Interval:  425 QTC  Calculation: 429 R Axis:   -23 Text Interpretation:  Sinus rhythm Borderline left axis deviation Confirmed by Randal Buba, Shreena Baines ([id number]) on 12/16/2018 2:50:06 AM   Radiology Dg Chest 2 View  Result Date: 12/16/2018 CLINICAL DATA:  Hypertensive, forgot to take medication. Nausea and vomiting. History of COPD/interstitial lung disease. EXAM: CHEST - 2 VIEW COMPARISON:  Chest radiograph July 17, 2016 and CT chest June 22, 2017 FINDINGS: Cardiac silhouette is mildly enlarged and unchanged. Chronic interstitial changes without pleural effusion or focal consolidation. No pneumothorax. Cervical spine hardware. Osteopenia. IMPRESSION: 1. Stable cardiomegaly and chronic interstitial lung disease. No focal consolidation. Electronically Signed   By: Elon Alas M.D.   On: 12/16/2018 03:38   Ct Head Wo Contrast  Result Date: 12/16/2018 CLINICAL DATA:  Vertigo and high blood pressure. EXAM: CT HEAD WITHOUT CONTRAST TECHNIQUE: Contiguous axial images were obtained from the base of the skull through the vertex without intravenous contrast. COMPARISON:  MRI brain 07/10/2014. CT head 12/26/2007 FINDINGS: Brain: No evidence of acute infarction, hemorrhage, hydrocephalus, extra-axial collection or mass lesion/mass effect. Vascular: Mild intracranial arterial vascular calcifications. Skull: Calvarium appears intact. Sinuses/Orbits: Paranasal sinuses and mastoid air cells are clear. Postoperative changes with left ostiomeatal windows. Other: No significant change since prior study. IMPRESSION: No acute intracranial abnormalities. Electronically Signed   By: Lucienne Capers M.D.   On: 12/16/2018 03:36    Procedures Procedures (including critical care time)  Medications Ordered in ED Medications  meclizine (ANTIVERT) tablet 25 mg (25 mg Oral Given 12/16/18 0544)     Symptoms consistent with peripheral vertigo.  Markedly improved post medication.  Will d/c with meclizine.  Take your blood pressure medication as  directed.   Final Clinical Impressions(s) / ED Diagnoses   Return for pain, intractable cough, productive cough,fevers >100.4 unrelieved by medication, shortness of breath, intractable vomiting, or diarrhea, abdominal pain, passing out,Inability to tolerate liquids or food, cough, altered mental status or any concerns. No signs of systemic illness or infection. The patient is nontoxic-appearing on exam and vital signs are within normal limits.   I have reviewed the triage vital signs and the nursing notes. Pertinent labs &imaging results that were available during my care of the patient were reviewed by me and considered in my medical decision making (see chart for details).  After history, exam, and medical workup I feel the patient has been  appropriately medically screened and is safe for discharge home. Pertinent diagnoses were discussed with the patient. Patient was given return precautions.   Mariaguadalupe Fialkowski, MD 12/16/18 [phone]

## 2018-12-16 NOTE — ED Triage Notes (Signed)
BIB [facility] from home with c/o of hypertension. Pt reports systolic of 686 tonight and took all of her BP medications with systolic remaining in the 200s. 1 episode of emesis. Received 4mg  zofran with relief of all nausea.

## 2019-01-01 DIAGNOSIS — E119 Type 2 diabetes mellitus without complications: Secondary | ICD-10-CM | POA: Diagnosis not present

## 2019-01-01 DIAGNOSIS — E785 Hyperlipidemia, unspecified: Secondary | ICD-10-CM | POA: Diagnosis not present

## 2019-01-01 DIAGNOSIS — I10 Essential (primary) hypertension: Secondary | ICD-10-CM | POA: Diagnosis not present

## 2019-02-04 ENCOUNTER — Other Ambulatory Visit: Payer: Self-pay

## 2019-02-04 ENCOUNTER — Telehealth: Payer: Self-pay | Admitting: Internal Medicine

## 2019-02-04 ENCOUNTER — Telehealth (INDEPENDENT_AMBULATORY_CARE_PROVIDER_SITE_OTHER): Payer: PPO | Admitting: Acute Care

## 2019-02-04 DIAGNOSIS — J441 Chronic obstructive pulmonary disease with (acute) exacerbation: Secondary | ICD-10-CM | POA: Diagnosis not present

## 2019-02-04 MED ORDER — AZITHROMYCIN 250 MG PO TABS: ORAL_TABLET | ORAL | 0 refills | Status: DC

## 2019-02-04 MED ORDER — PREDNISONE 10 MG PO TABS: 10.0000 mg | ORAL_TABLET | Freq: Every day | ORAL | 0 refills | Status: DC

## 2019-02-04 NOTE — Telephone Encounter (Signed)
Attempted to call pt but unable to reach. Left message for pt to return call. 

## 2019-02-04 NOTE — Telephone Encounter (Signed)
Patient is returning phone call.  Patient phone number is [phone].

## 2019-02-04 NOTE — Progress Notes (Signed)
Virtual Visit via Telephone Note  I connected with Angela [name] on 02/04/19 at  2:30 PM EDT by telephone and verified that I am speaking with the correct person using two identifiers.   I discussed the limitations, risks, security and privacy concerns of performing an evaluation and management service by telephone and the availability of in person appointments. I also discussed with the patient that there may be a patient responsible charge related to this service. The patient expressed understanding and agreed to proceed.  I have confirmed address and date of birth. Pt. was present at home, I was present at the office.  Synopsis: Angela [name] is a 73 y.o. 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 She is followed by Dr. Chase Caller.She is no longer on her Esbriet.  History of Present Illness: Pt. states she started coughing up yellow phlegm and has had a temp of 99 which started today, 3/23. Pt also states she has been more fatigued than usual. Pt states she believes the cough is due to her IPF and also due to the pollen.  Pt denies any recent travel and denies being around anyone that has been sick.   Pt states she has tried tylenol but states she has not noticed any difference yet.  Upon discussion, She lives in a senior living facility, and is on lock down since Tuesday 01/29/2019. Pt. States she has had increased fatigue for about 4 days. She then woke up this morning coughing up thick yellow secretions. Cough is not worse, it is just productive.She denies any increase in shortness of breath. She is compliant with her Spiriva daily and Albuterol prn. She is still getting her Prolastin infusions. She is scheduled for one tomorrow. She is compliant with her Prilosec. She states she had a slight fever of 98 ( per patient this is elevated for her). No orthopnea, chest pain or hemoptysis.      Observations/Objective: COPD Flare with pollen as trigger.  Assessment and Plan: COPD Flare with pollen as trigger.  Plan: Z pack Prednisone taper; 10 mg tablets: 4 tabs x 2 days, 3 tabs x 2 days, 2 tabs x 2 days 1 tab x 2 days then stop.  Mucinex 1200 mg BID with a full glass of water. Monitor blood sugars more closely while on prednisone Continue your daily medication for allergies. Consider using Flonase ( Fluticasone) 1 puff up each nare once daily for sinus inflammation. Continue your  prilosec for your reflux. Delsym for cough every 12 hours Continue your prolastin infusions as you have been doing. Continue Spiriva  Two puffs once daily. Monday May 13, 2019 at 9:30 am   Follow Up Instructions: Follow up appointment with Eric Form, NP 05/13/2019 at 9:30 am. If you get worse not better, call the office of seek emergency care.   I discussed the assessment and treatment plan with the patient. The patient was provided an opportunity to ask questions and all were answered. The patient agreed with the plan and demonstrated an understanding of the instructions.   The patient was advised to call back or seek an in-person evaluation if the symptoms worsen or if the condition fails to improve as anticipated.  I provided 25 minutes minutes of non-face-to-face time during this encounter assessing patient and symptoms and planning care.   Magdalen Spatz, NP

## 2019-02-04 NOTE — Telephone Encounter (Signed)
Called and spoke with pt letting her know that Angela [name] wants [place] to schedule e-visit with NP for this afternoon. Pt expressed understanding. Virtual OV scheduled for pt with Eric Form at 2:30. Nothing further needed.

## 2019-02-04 NOTE — Telephone Encounter (Signed)
Primary Pulmonologist: Ramaswamy Last office visit and with whom: 07/05/18 with MR What do we see them for (pulmonary problems): pulmonary emphysema with fibrosis of lung Last OV assessment/plan:  Instructions     ICD-10-CM   1. Pulmonary emphysema with fibrosis of lung ([place]) J43.9    J84.10   2. Alpha-1-antitrypsin deficiency ([place]) E88.01   3. Pulmonary emphysema, unspecified emphysema type ([place]) J43.9   4. IPF (idiopathic pulmonary fibrosis) ([facility]) J84.112     Lung stable x 10 months  Plan Continue proloastin Continue spiriva Respect no IPF antifibrotics or IPF trial per your wish  Followup Routine visit in 6 months; CAT Score and simple (not 6mwd) at followup Spiromerty and dlco at followup Consider CT at followup       Was appointment offered to patient (explain)?  Pt wants something to be prescribed   Reason for call: Called and spoke with pt who states she started coughing up yellow phlegm and has had a temp of 99 which started today, 3/23. Pt also states she has been more fatigued than usual.  Pt states she believes the cough is due to her IPF and also due to the pollen.  Pt denies any recent travel and denies being around anyone that has been sick.   Pt states she has tried tylenol but states she has not noticed any difference yet.  Beth, please advise recs for pt. Thanks!

## 2019-02-04 NOTE — Telephone Encounter (Signed)
Please schedule e-vist with app for today. Angela [name] or Angela [name]

## 2019-02-05 ENCOUNTER — Telehealth: Payer: Self-pay | Admitting: Acute Care

## 2019-02-05 NOTE — Telephone Encounter (Signed)
Call made to pharmacy, they did not receive the e-scribed meds that were sent. Verbal orders given. Call made to patient to make aware. Voiced understanding. Nothing further is needed at this time.

## 2019-04-09 DIAGNOSIS — G4733 Obstructive sleep apnea (adult) (pediatric): Secondary | ICD-10-CM | POA: Diagnosis not present

## 2019-04-12 ENCOUNTER — Other Ambulatory Visit: Payer: Self-pay | Admitting: Internal Medicine

## 2019-05-03 ENCOUNTER — Other Ambulatory Visit: Payer: Self-pay | Admitting: Gastroenterology

## 2019-05-13 ENCOUNTER — Ambulatory Visit: Payer: PPO | Admitting: Acute Care

## 2019-07-04 ENCOUNTER — Emergency Department (HOSPITAL_COMMUNITY)
Admission: EM | Admit: 2019-07-04 | Discharge: 2019-07-04 | Disposition: A | Discharge status: Home / Self Care | Payer: PPO | Attending: Emergency Medicine | Admitting: Emergency Medicine

## 2019-07-04 ENCOUNTER — Emergency Department (HOSPITAL_COMMUNITY): Payer: PPO

## 2019-07-04 ENCOUNTER — Encounter (HOSPITAL_COMMUNITY): Payer: Self-pay | Admitting: Emergency Medicine

## 2019-07-04 ENCOUNTER — Other Ambulatory Visit: Payer: Self-pay

## 2019-07-04 DIAGNOSIS — I10 Essential (primary) hypertension: Secondary | ICD-10-CM | POA: Diagnosis not present

## 2019-07-04 DIAGNOSIS — J45909 Unspecified asthma, uncomplicated: Secondary | ICD-10-CM | POA: Insufficient documentation

## 2019-07-04 DIAGNOSIS — E119 Type 2 diabetes mellitus without complications: Secondary | ICD-10-CM | POA: Diagnosis not present

## 2019-07-04 DIAGNOSIS — Z8673 Personal history of transient ischemic attack (TIA), and cerebral infarction without residual deficits: Secondary | ICD-10-CM | POA: Insufficient documentation

## 2019-07-04 DIAGNOSIS — Z87891 Personal history of nicotine dependence: Secondary | ICD-10-CM | POA: Diagnosis not present

## 2019-07-04 DIAGNOSIS — Z7984 Long term (current) use of oral hypoglycemic drugs: Secondary | ICD-10-CM | POA: Insufficient documentation

## 2019-07-04 DIAGNOSIS — R05 Cough: Secondary | ICD-10-CM | POA: Insufficient documentation

## 2019-07-04 DIAGNOSIS — J449 Chronic obstructive pulmonary disease, unspecified: Secondary | ICD-10-CM | POA: Insufficient documentation

## 2019-07-04 DIAGNOSIS — Z79899 Other long term (current) drug therapy: Secondary | ICD-10-CM | POA: Insufficient documentation

## 2019-07-04 DIAGNOSIS — R059 Cough, unspecified: Secondary | ICD-10-CM

## 2019-07-04 MED ORDER — PREDNISONE 10 MG (21) PO TBPK: ORAL_TABLET | Freq: Every day | ORAL | 0 refills | Status: DC

## 2019-07-04 NOTE — ED Provider Notes (Signed)
[facility] DEPT Provider Note   CSN: [id number] Arrival date & time: 07/04/19  1337     History   Chief Complaint Chief Complaint  Patient presents with   burning in lungs   Cough    HPI Angela [name] is a 73 y.o. female.     73 year old female with history of COPD who presents with increased cough and congestion.  Denies any fever or chills.  No vomiting or diarrhea.  No anginal or CHF type symptoms.  Is concerned that she may have pneumonia.  Has been using her home nebulizer with good relief.  Denies any sore throat or congestion.     Past Medical History:  Diagnosis Date   Allergy    Alpha 1-antitrypsin PiMS phenotype    Anemia 2014   mild   Anxiety    Arthritis    Asthma    Blood transfusion without reported diagnosis    Cataract    Chronic kidney disease    early stages of some insufficiency   Complication of anesthesia    passes out 24 hrs after getting   COPD (chronic obstructive pulmonary disease) ([place])    DDD (degenerative disc disease)    Depression    with anxiety   Diabetes mellitus    Diverticulitis    Diverticulosis    Esophageal dysmotility 03/01/06   mild   Fibromyalgia    GERD (gastroesophageal reflux disease)    Glaucoma    Heart murmur    "years ago"   Hypercholesterolemia    Hypertension    IPF (idiopathic pulmonary fibrosis) (H[facility])    Shingles    Shortness of breath dyspnea    Sleep apnea    wears CPAP   Stroke [facility]) 2010   3 tia's    Patient Active Problem List   Diagnosis Date Noted   Needs flu shot 09/13/2017   Upper airway cough syndrome 11/15/2016   Encounter for therapeutic drug monitoring 07/07/2016   IPF (idiopathic pulmonary fibrosis) ([place]) 04/14/2016   COPD exacerbation ([place]) 09/09/2015   Alpha-1-antitrypsin deficiency ([place]) 09/09/2015   Pulmonary emphysema with fibrosis of lung ([place]) 09/09/2015   ILD (interstitial lung disease) ([place]) 09/09/2015     Smoking history 08/11/2015   Family history of alpha 1 antitrypsin deficiency 08/11/2015   Dyspnea and respiratory abnormality 08/11/2015   HNP (herniated nucleus pulposus) 03/16/2015   Obesity 02/05/2014   Internal hemorrhoid 08/23/2013   RUQ abdominal pain 08/23/2013   Nonspecific abnormal finding in stool contents 08/23/2013   Abdominal pain, epigastric 06/03/2013   Acute diverticulitis 05/29/2013   Abdominal pain, left lower quadrant 05/29/2013   Nausea and vomiting 05/29/2013   Leukocytosis 05/29/2013   Acute renal failure ([place]) 05/29/2013   Anemia 05/29/2013   Diabetes ([place]) 05/29/2013   Dyslipidemia 05/29/2013   HTN (hypertension) 04/14/2013    Past Surgical History:  Procedure Laterality Date   ABDOMINAL HYSTERECTOMY     ANTERIOR FUSION CERVICAL SPINE     APPENDECTOMY     BACK SURGERY     x4   CARPAL TUNNEL RELEASE Bilateral    COLONOSCOPY     DE QUERVAIN'S RELEASE     right   GASTRIC FUNDOPLICATION     HERNIA REPAIR     KNEE ARTHROSCOPY     left    LUNG BIOPSY Left 03/23/2016   Procedure: LUNG BIOPSY;  Surgeon: Melrose Nakayama, MD;  Location: [facility];  Service: Thoracic;  Laterality: Left;   LYMPH NODE BIOPSY  Left 03/23/2016   Procedure: LYMPH NODE BIOPSY;  Surgeon: Melrose Nakayama, MD;  Location: [place];  Service: Thoracic;  Laterality: Left;   POSTERIOR CERVICAL FUSION/FORAMINOTOMY N/A 03/16/2015   Procedure: POSTERIOR CERVICAL LAMINECTOMY AND DISKECTOMOY AT CERVICAL SEVEN-THORACIC ONE ;POSTERIOR CERVICAL FUSION CERVICAL SIX TO THORACIC ONE;  Surgeon: Kary Kos, MD;  Location: [facility];  Service: Neurosurgery;  Laterality: N/A;  Site is Cervical/Thoracic   TONSILLECTOMY     UPPER GASTROINTESTINAL ENDOSCOPY     VIDEO ASSISTED THORACOSCOPY Left 03/23/2016   Procedure: VIDEO ASSISTED THORACOSCOPY;  Surgeon: Melrose Nakayama, MD;  Location: [facility];  Service: Thoracic;  Laterality: Left;     OB History   No obstetric  history on file.      Home Medications    Prior to Admission medications   Medication Sig Start Date End Date Taking? Authorizing Provider  acetaminophen (TYLENOL) 325 MG tablet Take 650 mg by mouth every 6 (six) hours as needed (joint pain/swelling).    [provider]  albuterol (PROVENTIL) (2.5 MG/3ML) 0.083% nebulizer solution Take 3 mLs (2.5 mg total) by nebulization every 6 (six) hours as needed for wheezing or shortness of breath. 07/05/18   Brand Males, MD  atorvastatin (LIPITOR) 40 MG tablet Take 1 tablet by mouth daily. 11/15/16   [provider]  azithromycin (ZITHROMAX Z-PAK) 250 MG tablet Take 2 tablets on first day, then one tablet daily x 4 days until complete 02/04/19   Magdalen Spatz, NP  brimonidine (ALPHAGAN) 0.15 % ophthalmic solution Place 1 drop into both eyes 3 (three) times daily. 07/12/17   [provider]  buPROPion (WELLBUTRIN XL) 300 MG 24 hr tablet Take 1 tablet by mouth every morning. 05/04/16   [provider]  cyclobenzaprine (FLEXERIL) 10 MG tablet Take 10 mg by mouth at bedtime as needed for muscle spasms.  06/09/15   [provider]  glipiZIDE (GLUCOTROL XL) 5 MG 24 hr tablet TK 1 T PO QD 11/21/17   [provider]  hydrochlorothiazide (MICROZIDE) 12.5 MG capsule Take 1 capsule (12.5 mg total) by mouth daily. 05/22/17   Jerline Pain, MD  latanoprost (XALATAN) 0.005 % ophthalmic solution 1 drop at bedtime. Left eye    [provider]  losartan (COZAAR) 25 MG tablet TAKE 1 TABLET(25 MG) BY MOUTH DAILY 09/09/15   Brand Males, MD  meclizine (ANTIVERT) 12.5 MG tablet Take 1 tablet (12.5 mg total) by mouth 3 (three) times daily as needed for dizziness. 12/16/18   Palumbo, April, MD  Menthol, Topical Analgesic, (BIOFREEZE) 4 % GEL Apply 1 application topically 3 (three) times daily as needed (for back pain).    [provider]  metFORMIN (GLUCOPHAGE-XR) 500 MG 24 hr tablet TK 2 TS PO BID 01/25/18    [provider]  metoprolol succinate (TOPROL-XL) 25 MG 24 hr tablet TAKE 1 TABLET(25 MG) BY MOUTH DAILY 05/22/17   Jerline Pain, MD  Multiple Vitamin (MULTIVITAMIN) capsule Take 1 capsule by mouth daily.    [provider]  omeprazole (PRILOSEC) 40 MG capsule TAKE 1 CAPSULE(40 MG) BY MOUTH TWICE DAILY BEFORE LUNCH AND SUPPER 05/03/19   Mansouraty, Telford Nab., MD  ondansetron (ZOFRAN ODT) 4 MG disintegrating tablet Take 1 tablet (4 mg total) by mouth every 8 (eight) hours as needed for nausea or vomiting. 07/25/15   Little, Wenda Overland, MD  predniSONE (DELTASONE) 10 MG tablet Take 1 tablet (10 mg total) by mouth daily with breakfast. Prednisone taper; 10  mg tablets: 4 tabs x 2 days, 3 tabs x 2 days, 2 tabs x 2 days 1 tab x 2 days then stop. This taper will occur over 8 days Monitor blood sugars carefully while on prednisone 02/04/19   Magdalen Spatz, NP  PROLASTIN-C 1000 MG SOLR injection Inject 1,000 mg into the skin once a week. 07/12/17   [provider]  promethazine (PHENERGAN) 25 MG tablet Take 1 tablet (25 mg total) by mouth every 6 (six) hours as needed for nausea or vomiting. 08/04/17   Virgel Manifold, MD  sertraline (ZOLOFT) 50 MG tablet Take 50 mg by mouth daily.    [provider]  SPIRIVA HANDIHALER 18 MCG inhalation capsule PLACE 1 CAPSULE INTO INHALER AND INHALE DAILY 04/12/19   Brand Males, MD  traMADol (ULTRAM) 50 MG tablet Take 1-2 tablets (50-100 mg total) by mouth every 6 (six) hours as needed (mild pain). 03/26/16   Barrett, Lodema Hong, PA-C    Family History Family History  Problem Relation Age of Onset   Cancer Mother        brain   Lung cancer Mother    COPD Father    Stomach cancer Maternal Grandfather    Colon cancer Maternal Aunt    Esophageal cancer Neg Hx    Rectal cancer Neg Hx     Social History Social History   Tobacco Use   Smoking status: Former Smoker    Packs/day: 1.00    Years: 22.00    Pack years: 22.00     Types: Cigarettes    Quit date: 04/14/2012    Years since quitting: 7.2   Smokeless tobacco: Never Used   Tobacco comment: Quit 1 year ago in June 2013.    Substance Use Topics   Alcohol use: No    Alcohol/week: 0.0 standard drinks   Drug use: No     Allergies   Codeine, Iohexol, Tizanidine, Betadine [povidone iodine], and Ciprofloxacin   Review of Systems Review of Systems  All other systems reviewed and are negative.    Physical Exam Updated Vital Signs BP (!) 175/69    Pulse 63    Temp 98.6 F (37 C)    Resp 18    LMP  (LMP Unknown)    SpO2 97%   Physical Exam Vitals signs and nursing note reviewed.  Constitutional:      General: She is not in acute distress.    Appearance: Normal appearance. She is well-developed. She is not toxic-appearing.  HENT:     Head: Normocephalic and atraumatic.  Eyes:     General: Lids are normal.     Conjunctiva/sclera: Conjunctivae normal.     Pupils: Pupils are equal, round, and reactive to light.  Neck:     Musculoskeletal: Normal range of motion and neck supple.     Thyroid: No thyroid mass.     Trachea: No tracheal deviation.  Cardiovascular:     Rate and Rhythm: Normal rate and regular rhythm.     Heart sounds: Normal heart sounds. No murmur. No gallop.   Pulmonary:     Effort: Pulmonary effort is normal. No respiratory distress.     Breath sounds: Normal breath sounds. No stridor. No decreased breath sounds, wheezing, rhonchi or rales.  Abdominal:     General: Bowel sounds are normal. There is no distension.     Palpations: Abdomen is soft.     Tenderness: There is no abdominal tenderness. There is no rebound.  Musculoskeletal: Normal  range of motion.        General: No tenderness.  Skin:    General: Skin is warm and dry.     Findings: No abrasion or rash.  Neurological:     Mental Status: She is alert and oriented to person, place, and time.     GCS: GCS eye subscore is 4. GCS verbal subscore is 5. GCS motor  subscore is 6.     Cranial Nerves: No cranial nerve deficit.     Sensory: No sensory deficit.  Psychiatric:        Speech: Speech normal.        Behavior: Behavior normal.      ED Treatments / Results  Labs (all labs ordered are listed, but only abnormal results are displayed) Labs Reviewed - No data to display  EKG None  Radiology Dg Chest 2 View  Result Date: 07/04/2019 CLINICAL DATA:  Burning in lungs EXAM: CHEST - 2 VIEW COMPARISON:  12/16/2018 FINDINGS: Upper normal size of cardiac silhouette. Mediastinal contours and pulmonary vascularity normal. Emphysematous changes and minimal peribronchial thickening consistent with COPD. Scattered bullous disease changes especially in the upper lobes. Diffuse interstitial prominence unchanged. No definite acute infiltrate, pleural effusion or pneumothorax. Bones demineralized with evidence of prior cervicothoracic fusion. IMPRESSION: Changes of COPD, bullous disease, and chronic interstitial lung disease. No acute abnormalities. Electronically Signed   By: Lavonia Dana M.D.   On: 07/04/2019 14:46    Procedures Procedures (including critical care time)  Medications Ordered in ED Medications - No data to display   Initial Impression / Assessment and Plan / ED Course  I have reviewed the triage vital signs and the nursing notes.  Pertinent labs & imaging results that were available during my care of the patient were reviewed by me and considered in my medical decision making (see chart for details).        Chest x-ray without acute changes here.  Lungs sound good here.  Pulse oximetry is stable.  She is not tachycardic or tachypneic.  Will place on prednisone taper and she will continue her home nebulizers and return precautions given  Final Clinical Impressions(s) / ED Diagnoses   Final diagnoses:  None    ED Discharge Orders    None       Lacretia Leigh, MD 07/04/19 [phone]

## 2019-07-04 NOTE — ED Triage Notes (Signed)
Pt repotrs that she has alpha 1 and IPF reports since Sunday had burning in lungs. Reports gets winded when walked from 3 parking lots over to lobby doors. Reports cough with sputum. Gets weekly infusions. Reports feels like she has PNA.

## 2019-07-09 DIAGNOSIS — Z1211 Encounter for screening for malignant neoplasm of colon: Secondary | ICD-10-CM | POA: Diagnosis not present

## 2019-07-09 DIAGNOSIS — E8801 Alpha-1-antitrypsin deficiency: Secondary | ICD-10-CM | POA: Diagnosis not present

## 2019-07-09 DIAGNOSIS — E785 Hyperlipidemia, unspecified: Secondary | ICD-10-CM | POA: Diagnosis not present

## 2019-07-09 DIAGNOSIS — H353131 Nonexudative age-related macular degeneration, bilateral, early dry stage: Secondary | ICD-10-CM | POA: Diagnosis not present

## 2019-07-09 DIAGNOSIS — Z0000 Encounter for general adult medical examination without abnormal findings: Secondary | ICD-10-CM | POA: Diagnosis not present

## 2019-07-09 DIAGNOSIS — J449 Chronic obstructive pulmonary disease, unspecified: Secondary | ICD-10-CM | POA: Diagnosis not present

## 2019-07-09 DIAGNOSIS — Z1159 Encounter for screening for other viral diseases: Secondary | ICD-10-CM | POA: Diagnosis not present

## 2019-07-09 DIAGNOSIS — F341 Dysthymic disorder: Secondary | ICD-10-CM | POA: Diagnosis not present

## 2019-07-09 DIAGNOSIS — J84112 Idiopathic pulmonary fibrosis: Secondary | ICD-10-CM | POA: Diagnosis not present

## 2019-07-09 DIAGNOSIS — G4733 Obstructive sleep apnea (adult) (pediatric): Secondary | ICD-10-CM | POA: Diagnosis not present

## 2019-07-09 DIAGNOSIS — F411 Generalized anxiety disorder: Secondary | ICD-10-CM | POA: Diagnosis not present

## 2019-07-09 DIAGNOSIS — I10 Essential (primary) hypertension: Secondary | ICD-10-CM | POA: Diagnosis not present

## 2019-07-09 DIAGNOSIS — E1122 Type 2 diabetes mellitus with diabetic chronic kidney disease: Secondary | ICD-10-CM | POA: Diagnosis not present

## 2019-08-07 ENCOUNTER — Encounter (INDEPENDENT_AMBULATORY_CARE_PROVIDER_SITE_OTHER): Payer: PPO | Admitting: Ophthalmology

## 2019-08-07 DIAGNOSIS — R05 Cough: Secondary | ICD-10-CM | POA: Diagnosis not present

## 2019-08-29 ENCOUNTER — Other Ambulatory Visit: Payer: Self-pay

## 2019-08-29 ENCOUNTER — Ambulatory Visit (INDEPENDENT_AMBULATORY_CARE_PROVIDER_SITE_OTHER): Payer: PPO | Admitting: Internal Medicine

## 2019-08-29 DIAGNOSIS — J8410 Pulmonary fibrosis, unspecified: Secondary | ICD-10-CM

## 2019-08-29 DIAGNOSIS — E8801 Alpha-1-antitrypsin deficiency: Secondary | ICD-10-CM | POA: Diagnosis not present

## 2019-08-29 DIAGNOSIS — J84112 Idiopathic pulmonary fibrosis: Secondary | ICD-10-CM | POA: Diagnosis not present

## 2019-08-29 DIAGNOSIS — J849 Interstitial pulmonary disease, unspecified: Secondary | ICD-10-CM | POA: Diagnosis not present

## 2019-08-29 DIAGNOSIS — J439 Emphysema, unspecified: Secondary | ICD-10-CM | POA: Diagnosis not present

## 2019-08-29 DIAGNOSIS — J441 Chronic obstructive pulmonary disease with (acute) exacerbation: Secondary | ICD-10-CM

## 2019-08-29 MED ORDER — PREDNISONE 10 MG PO TABS: ORAL_TABLET | ORAL | 0 refills | Status: DC

## 2019-08-29 MED ORDER — DOXYCYCLINE HYCLATE 100 MG PO TABS: ORAL_TABLET | ORAL | 0 refills | Status: DC

## 2019-08-29 NOTE — Addendum Note (Signed)
Addended by: Collier Salina on: 08/29/2019 11:09 AM   Modules accepted: Orders

## 2019-08-29 NOTE — Progress Notes (Signed)
\  OV 10/10/2016  Chief Complaint  Patient presents with  . Follow-up    Pt states overall her breathing has slightly improved since last OV. Pt states she has prod cough with yellow. Pt c/o midsternal CP 20 min after 2nd or 3rd dose of Esbriet.     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 This was also personally visualized. The main issues that were 3+3 times daily of Pirfenidone (Esbriet) she's having significant GI side effects of fatigue, nausea and bloated sensation and even chest pain after her dose. She's tried ginger she's had between meals. She has pacingg doses for several hours. All and communication with the help desk - but no improvement. She rates his symptoms is severe. Nevertheless because of the severity of pulmonary fibrosis and she is willing to continue with the medications although at a lower dose. She's not interested in switching over to  Ofev at this point in time. In the past she's not being in her about participating in the Pulm fibrosis foundation support group or research trials. She's not lost weight. Not yet had transplant discussion in detail. She is asking about alpha one replacement therapy because of the decline in lung function and also recurrent exacerbations with the antibiotic courses since last visit.    OV 10/10/2016  MR Chief Complaint  Patient presents with  . Follow-up    Pt states overall her breathing has slightly improved since last OV. Pt states she has prod cough with yellow. Pt c/o midsternal CP 20 min after 2nd or 3rd dose of Esbriet.   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  This was also personally visualized. The main issues that were 3+3 times daily of Pirfenidone (Esbriet) she's having significant GI side effects of fatigue, nausea and bloated sensation and even chest pain after her dose. She's tried ginger she's had between meals. She has pacingg doses for several hours. All and communication with the help desk - but no improvement. She rates his symptoms is severe. Nevertheless because of the severity of pulmonary fibrosis and she is willing to continue with the medications although at a lower dose. She's not interested in switching over to  Ofev at this point in time. In the past she's not being in her about participating in the Pulm fibrosis foundation support group or research trials. She's not lost weight. Not yet had transplant discussion in detail. She is asking about alpha one replacement therapy because of the decline in lung function and also recurrent exacerbations with the antibiotic courses since last visit.  rec    - start prolastin replacement for MZ phenotype with decline lung function and repeated flare up> denied by insurance  - continue esbriet but reduce dose to to 2 pill three times daily - take with food and ginger    11/08/2016 acute extended ov/Wert re:  GOLD 0 copd/ MZ quit smoking 2013 / underlying ILD on perfenidone  Chief Complaint  Patient presents with  . Acute Visit    coughing so much it burns, used nebulizer to help, congestion with clear/yellow mucus, headache  new cough that caused vomiting late 90s  zpak did not work/ doxy/pred  Worked last flare and lasted  4-5 weeks then acutely worse x one day prior to OV with severe coughing fits and midline cp brought on by coughing some better with neb/ ? spiriva dpi making it worse   No obvious day to day or daytime variability or assoc   mucus plugs or hemoptysis or cp or chest tightness, subjective wheeze or overt sinus or hb symptoms. No unusual exp hx or h/o childhood pna/ asthma or  knowledge of premature birth.  Sleeping ok without nocturnal  or early am exacerbation  of respiratory  c/o's or need for noct saba. Also denies any obvious fluctuation of symptoms with weather or environmental changes or other aggravating or alleviating factors except as outlined above    OV 12/02/2016  Chief Complaint  Patient presents with  . Follow-up    Pt is here for 6 wk f/u. Pt fibrosis has remained unchanged, but has a question about the esbriet and prolastin    Follow-up idiopathic pulmonary fibrosis Emphysema MZ phenotype  Overall stable since last visit. Alpha-1 replacement has been approved but because of her  Ex-husband's death she has more money now and therefore her co-pay for both Prolastin and esbriet will increase. She's been worked [employer] to keep the co-pay cost down. If she does not succeed with this she will give up on both drugs. Prolastin has not been started yet. No new issues.   OV 06/08/2017  Chief Complaint  Patient presents with  . Follow-up    Pt states her breathing is unchanged since last OV. Pt c/o dry cough. Pt denies CP/tightness and f/c/s.      .Follow-up idiopathic pulmonary fibrosis with associated emphysema MZ phenotype with low levels of 75 mg/dL  She is here for routine follow-up. Last visit January 2018. Since then she's reporting progressive worsening of cough and shortness of breath particularly with significant exertion such as climbing stairs or walking fast. In terms of IPF she is not interested in Pirfenidone [facility]) because of side effects that she had. She gave this up late last year early this year. She is also not interested in Ofev because of the diarrhea side effects. She prefers to be on basic supportive care and she is interested in pulmonary IPF research trials. In terms of emphysema MZ phenotype she is very interested in alpha-1 replacement therapy. The last 6 months. Been getting insurance denial for the same. She showed me  that Medicare part D criteria which is based on low levels, progression and also low FEV1. She will not be a candidate for low FEV1 . Results for  because of mixed emphysema and fibrosis. I will need to do a special appealed for this. Otherwise no other changes in health status.     OV 09/13/2017  Chief Complaint  Patient presents with  . Follow-up    PFT done today, breathing better on prolastin    Follow-up idiopathic pulmonary fibrosis on basic supportive care because she does not want to do either fibrotic Follow-up associated emphysema MZ phenotype with low levels - on Prolastin therapy  She is following up. She again continues to refuse anti-fibrotic therapy. She is now on Prolastin and has had 9 infusions. She is tolerating this well. She tells me that overall she feels the lungs a stronger Her cough quality is better able to bring her better sputum. She feels that she is less at risk for infections. He just makes her feel good. In fact she feels confident that  she can tolerate a flu shot and is willing to have at this time.however pulmonary function test continues to show decline as documented below    OV 07/05/2018  Chief Complaint  Patient presents with  . Follow-up    Pt had a 52mw performed today.  Pt states she has been having some problems with the heat and breathing in dirt from where they have been doing a lot of construction. States she has been having an occ. cough with yellow mucus and states when it rains she will have some pain in her left lung.     Follow-up idiopathic pulmonary fibrosis on basic supportive care because she does not want to do either fibrotic Follow-up associated emphysema MZ phenotype with low levels - on Prolastin therapy   Angela [name] - returns for follow-up of the above. She continues to do well. Minimally symptomatic. Walking desaturation test 6 minute walk test did not desaturate. I was very concerned in October 2018 that despite Prolastin  her lung function was declining as documented below but since then she's not had any exacerbations except one in spring 2019And April 2019 Allred function test showed stability. She certainly feels she is stable. She does not want any antibiotics. She's not interested in IPF research trials     OV 08/29/2019  Subjective:  Patient ID: Angela [name], female , DOB: June 06, 1946 , age 73 y.o. , MRN: [id number] , ADDRESS: [address] [place] [address]   08/29/2019 -   Chief Complaint  Patient presents with  . Follow-up    Pt c/o SOB, headache, prod cough with yellow mucus, chest burning x 1 month. Pt denies f/c/s. Pt states she was given a zpak by her PCP and it help breifly but now s/s are back like before. Pt also states she has noticed lately the spiriva doesnt seem to be helping.      Follow-up idiopathic pulmonary fibrosis on basic supportive care because she does not want to do either fibrotic  Follow-up associated emphysema MZ phenotype with low levels - on Prolastin therapy    HPI CONLEY POITEVINT 73 y.o. -telephone visit.  The risk benefits and limitations of telephone visit explained.  I personally not seen the patient in over a year.  She says she is continuing to get Prolastin for her alpha-1 antitrypsin and is continuing to do Spiriva.  In August 2020 she ended up in the ER with worsening cough.  This was at [facility].  She had a chest x-ray that was clear and was sent home on prednisone.  She did not have any blood work or COVID testing or antibiotics.  Since then she is having yellow sputum worsening cough worsening shortness of breath compared to her baseline but is not progressively worse.  She just has a new lower baseline.  She was frustrated by the ER visit.  There are no other complaints.  Is no fever or chills.  No known covert exposures.  COVID risk activity is low.  She is social distancing quite well.   Last CT scan of the chest was in 2018 with the  pulmonary fibrosis.  Last pulmonary function test was April 2019 which already showed a progressive pattern.  She refused anti-fibrotic's for her ILD.      Results for DAWN, ROBBEN" (MRN [id number]) as of 07/05/2018 11:21  Ref. Range 08/28/2015 16:37 03/04/2016 12:53 08/16/2016 10:21 06/08/2017 08:48 09/13/2017 08:45 02/14/2018 09:14  FVC-Pre Latest  Units: L 2.81 2.79 2.69 2.63 2.45 2.44  FVC-%Pred-Pre Latest Units: % 79 79 76 75 70 70     Results for TAJANAE, SARE" (MRN [id number]) as of 07/05/2018 11:21  Ref. Range 08/28/2015 16:37 03/04/2016 12:53 08/16/2016 10:21 06/08/2017 08:48 09/13/2017 08:45 02/14/2018 09:14  DLCO unc Latest Units: ml/min/mmHg 15.56 14.76 14.10 13.31 12.31 12.16  DLCO unc % pred Latest Units: % 53 50 48 45 42 41  IMPRESSION: 1. There continues to be a spectrum of findings indicative of interstitial lung disease with minimal progression compared to the prior study. The overall appearance is considered a "probable UIP CT pattern." 2. Diffuse bronchial wall thickening with moderate centrilobular and paraseptal emphysema; imaging findings suggestive of underlying COPD. 3. Aortic atherosclerosis, in addition to left main and 2 vessel coronary artery disease. Please note that although the presence of coronary artery calcium documents the presence of coronary artery disease, the severity of this disease and any potential stenosis cannot be assessed on this non-gated CT examination. Assessment for potential risk factor modification, dietary therapy or pharmacologic therapy may be warranted, if clinically indicated. 4. Mild hepatic steatosis.  Aortic Atherosclerosis (ICD10-I70.0) and Emphysema (ICD10-J43.9).   Electronically Signed   By: Vinnie Langton M.D.   On: 06/22/2017 17:00 ROS - per HPI     has a past medical history of Allergy, Alpha 1-antitrypsin PiMS phenotype, Anemia (2014), Anxiety, Arthritis, Asthma, Blood transfusion  without reported diagnosis, Cataract, Chronic kidney disease, Complication of anesthesia, COPD (chronic obstructive pulmonary disease) ([place]), DDD (degenerative disc disease), Depression, Diabetes mellitus, Diverticulitis, Diverticulosis, Esophageal dysmotility (03/01/06), Fibromyalgia, GERD (gastroesophageal reflux disease), Glaucoma, Heart murmur, Hypercholesterolemia, Hypertension, IPF (idiopathic pulmonary fibrosis) ([place]), Shingles, Shortness of breath dyspnea, Sleep apnea, and Stroke ([place]) (2010).   reports that she quit smoking about 7 years ago. Her smoking use included cigarettes. She has a 22.00 pack-year smoking history. She has never used smokeless tobacco.  Past Surgical History:  Procedure Laterality Date  . ABDOMINAL HYSTERECTOMY    . ANTERIOR FUSION CERVICAL SPINE    . APPENDECTOMY    . BACK SURGERY     x4  . CARPAL TUNNEL RELEASE Bilateral   . COLONOSCOPY    . DE QUERVAIN'S RELEASE     right  . GASTRIC FUNDOPLICATION    . HERNIA REPAIR    . KNEE ARTHROSCOPY     left   . LUNG BIOPSY Left 03/23/2016   Procedure: LUNG BIOPSY;  Surgeon: Melrose Nakayama, MD;  Location: [facility];  Service: Thoracic;  Laterality: Left;  . LYMPH NODE BIOPSY Left 03/23/2016   Procedure: LYMPH NODE BIOPSY;  Surgeon: Melrose Nakayama, MD;  Location: [facility];  Service: Thoracic;  Laterality: Left;  . POSTERIOR CERVICAL FUSION/FORAMINOTOMY N/A 03/16/2015   Procedure: POSTERIOR CERVICAL LAMINECTOMY AND DISKECTOMOY AT CERVICAL SEVEN-THORACIC ONE ;POSTERIOR CERVICAL FUSION CERVICAL SIX TO THORACIC ONE;  Surgeon: Kary Kos, MD;  Location: [facility];  Service: Neurosurgery;  Laterality: N/A;  Site is Cervical/Thoracic  . TONSILLECTOMY    . UPPER GASTROINTESTINAL ENDOSCOPY    . VIDEO ASSISTED THORACOSCOPY Left 03/23/2016   Procedure: VIDEO ASSISTED THORACOSCOPY;  Surgeon: Melrose Nakayama, MD;  Location: [facility];  Service: Thoracic;  Laterality: Left;    Allergies  Allergen Reactions  . Codeine Nausea  And Vomiting  . Iohexol      Desc: Pt states several years ago during a CT scan w/ iv cotnrast she had severe nausea and vomiting, sob w/ difficulty breathing and  swallowing.  She had full 13hr. premeds today (5/14/7) and did fine w/o complications.   . Tizanidine Other (See Comments)    Severe decrease in BP   . Betadine [Povidone Iodine] Rash  . Ciprofloxacin Rash    Immunization History  Administered Date(s) Administered  . Influenza, High Dose Seasonal PF 09/13/2017  . Pneumococcal-Unspecified 11/14/2012  . Tdap 09/05/2016    Family History  Problem Relation Age of Onset  . Cancer Mother        brain  . Lung cancer Mother   . COPD Father   . Stomach cancer Maternal Grandfather   . Colon cancer Maternal Aunt   . Esophageal cancer Neg Hx   . Rectal cancer Neg Hx      Current Outpatient Medications:  .  acetaminophen (TYLENOL) 325 MG tablet, Take 650 mg by mouth every 6 (six) hours as needed (joint pain/swelling)., Disp: , Rfl:  .  albuterol (PROVENTIL) (2.5 MG/3ML) 0.083% nebulizer solution, Take 3 mLs (2.5 mg total) by nebulization every 6 (six) hours as needed for wheezing or shortness of breath., Disp: 900 mL, Rfl: 1 .  atorvastatin (LIPITOR) 40 MG tablet, Take 1 tablet by mouth daily., Disp: , Rfl: 11 .  brimonidine (ALPHAGAN) 0.15 % ophthalmic solution, Place 1 drop into both eyes 3 (three) times daily., Disp: , Rfl: 3 .  buPROPion (WELLBUTRIN XL) 300 MG 24 hr tablet, Take 1 tablet by mouth every morning., Disp: , Rfl:  .  cyclobenzaprine (FLEXERIL) 10 MG tablet, Take 10 mg by mouth at bedtime as needed for muscle spasms. , Disp: , Rfl: 0 .  glipiZIDE (GLUCOTROL XL) 5 MG 24 hr tablet, TK 1 T PO QD, Disp: , Rfl: 11 .  hydrochlorothiazide (MICROZIDE) 12.5 MG capsule, Take 1 capsule (12.5 mg total) by mouth daily., Disp: 90 capsule, Rfl: 1 .  latanoprost (XALATAN) 0.005 % ophthalmic solution, 1 drop at bedtime. Left eye, Disp: , Rfl:  .  losartan (COZAAR) 25 MG tablet,  TAKE 1 TABLET(25 MG) BY MOUTH DAILY (Patient taking differently: 50 mg. ), Disp: 90 tablet, Rfl: 1 .  meclizine (ANTIVERT) 12.5 MG tablet, Take 1 tablet (12.5 mg total) by mouth 3 (three) times daily as needed for dizziness., Disp: 16 tablet, Rfl: 0 .  Menthol, Topical Analgesic, (BIOFREEZE) 4 % GEL, Apply 1 application topically 3 (three) times daily as needed (for back pain)., Disp: , Rfl:  .  metFORMIN (GLUCOPHAGE-XR) 500 MG 24 hr tablet, TK 2 TS PO BID, Disp: , Rfl: 11 .  metoprolol succinate (TOPROL-XL) 25 MG 24 hr tablet, TAKE 1 TABLET(25 MG) BY MOUTH DAILY, Disp: 90 tablet, Rfl: 1 .  Multiple Vitamin (MULTIVITAMIN) capsule, Take 1 capsule by mouth daily., Disp: , Rfl:  .  omeprazole (PRILOSEC) 40 MG capsule, TAKE 1 CAPSULE(40 MG) BY MOUTH TWICE DAILY BEFORE LUNCH AND SUPPER, Disp: 90 capsule, Rfl: 3 .  ondansetron (ZOFRAN ODT) 4 MG disintegrating tablet, Take 1 tablet (4 mg total) by mouth every 8 (eight) hours as needed for nausea or vomiting., Disp: 10 tablet, Rfl: 0 .  PROLASTIN-C 1000 MG SOLR injection, Inject 1,000 mg into the skin once a week., Disp: , Rfl:  .  promethazine (PHENERGAN) 25 MG tablet, Take 1 tablet (25 mg total) by mouth every 6 (six) hours as needed for nausea or vomiting., Disp: 12 tablet, Rfl: 0 .  sertraline (ZOLOFT) 50 MG tablet, Take 50 mg by mouth daily., Disp: , Rfl:  .  SPIRIVA HANDIHALER 18 MCG inhalation  capsule, PLACE 1 CAPSULE INTO INHALER AND INHALE DAILY, Disp: 30 capsule, Rfl: 5 .  traMADol (ULTRAM) 50 MG tablet, Take 1-2 tablets (50-100 mg total) by mouth every 6 (six) hours as needed (mild pain)., Disp: 30 tablet, Rfl: 0 .  azithromycin (ZITHROMAX Z-PAK) 250 MG tablet, Take 2 tablets on first day, then one tablet daily x 4 days until complete, Disp: 6 each, Rfl: 0 .  predniSONE (DELTASONE) 10 MG tablet, Take 1 tablet (10 mg total) by mouth daily with breakfast. Prednisone taper; 10 mg tablets: 4 tabs x 2 days, 3 tabs x 2 days, 2 tabs x 2 days 1 tab x 2 days  then stop. This taper will occur over 8 days Monitor blood sugars carefully while on prednisone, Disp: 20 tablet, Rfl: 0 .  predniSONE (STERAPRED UNI-PAK 21 TAB) 10 MG (21) TBPK tablet, Take by mouth daily. Take 6 tabs by mouth daily  for 2 days, then 5 tabs for 2 days, then 4 tabs for 2 days, then 3 tabs for 2 days, 2 tabs for 2 days, then 1 tab by mouth daily for 2 days, Disp: 42 tablet, Rfl: 0      Objective:   There were no vitals filed for this visit.  Estimated body mass index is 32.08 kg/m as calculated from the following:   Height as of 12/16/18: 5\' 8"  (1.727 m).   Weight as of 12/16/18: 211 lb (95.7 kg).  @WEIGHTCHANGE @  There were no vitals filed for this visit.  Home BP 137/63 and pulse 60/min - per patient   Physical Exam Telephone visit so no physical exam         Assessment:       ICD-10-CM   1. COPD with acute exacerbation ([place])  J44.1   2. Pulmonary emphysema with fibrosis of lung ([place])  J43.9    J84.10   3. Alpha-1-antitrypsin deficiency ([place])  E88.01   4. Pulmonary emphysema, unspecified emphysema type ([place])  J43.9   5. IPF (idiopathic pulmonary fibrosis) ([place])  J84.112        Plan:     Patient Instructions  COPD with acute exacerbation ([place])  - not sure if this is going on but to be on safe side treat as   - .Take doxycycline 100mg  po twice daily x 5 days; take after meals and avoid sunlight  - Please take prednisone 40 mg x1 day, then 30 mg x1 day, then 20 mg x1 day, then 10 mg x1 day, and then 5 mg x1 day and stop   Pulmonary emphysema with fibrosis of lung (HCC) Alpha-1-antitrypsin deficiency ([place]) Pulmonary emphysema, unspecified emphysema type ([place]) IPF (idiopathic pulmonary fibrosis) ([place])   - we need to restage and see where things are  Plan - do HRCT supine and prone next few weeks (last in 2018) -  Do spirometry and dlco (last April 2019)  - in next few weeks  Followup  - next few to sevral weeks but after completing above  > 50%  of this > 25 min visit spent in face to face counseling or coordination of care - by this undersigned MD - Dr Brand Males. This includes one or more of the following documented above: discussion of test results, diagnostic or treatment recommendations, prognosis, risks and benefits of management options, instructions, education, compliance or risk-factor reduction    SIGNATURE    Dr. Brand Males, M.D., F.C.C.P,  Pulmonary and Critical Care Medicine Staff Physician, [facility] Director - Interstitial  Lung Disease  Program  P[facility] [place] at [place], [place], [address]  Pager: [phone], If no answer or between  15:00h - 7:00h: call [phone] Telephone: [phone]  10:42 AM 08/29/2019

## 2019-08-29 NOTE — Patient Instructions (Addendum)
COPD with acute exacerbation ([place])  - not sure if this is going on but to be on safe side treat as   - .Take doxycycline 100mg  po twice daily x 5 days; take after meals and avoid sunlight  - Please take prednisone 40 mg x1 day, then 30 mg x1 day, then 20 mg x1 day, then 10 mg x1 day, and then 5 mg x1 day and stop   Pulmonary emphysema with fibrosis of lung (HCC) Alpha-1-antitrypsin deficiency ([place]) Pulmonary emphysema, unspecified emphysema type ([place]) IPF (idiopathic pulmonary fibrosis) ([place])   - we need to restage and see where things are  Plan - do HRCT supine and prone next few weeks (last in 2018) -  Do spirometry and dlco (last April 2019)  - in next few weeks  Followup  - next few to sevral weeks but after completing above

## 2019-09-23 ENCOUNTER — Ambulatory Visit (INDEPENDENT_AMBULATORY_CARE_PROVIDER_SITE_OTHER)
Admission: RE | Admit: 2019-09-23 | Discharge: 2019-09-23 | Disposition: A | Discharge status: Home / Self Care | Payer: PPO | Source: Ambulatory Visit | Attending: Internal Medicine | Admitting: Internal Medicine

## 2019-09-23 ENCOUNTER — Other Ambulatory Visit: Payer: Self-pay

## 2019-09-23 ENCOUNTER — Telehealth: Payer: Self-pay | Admitting: Internal Medicine

## 2019-09-23 DIAGNOSIS — J84112 Idiopathic pulmonary fibrosis: Secondary | ICD-10-CM

## 2019-09-23 DIAGNOSIS — R0602 Shortness of breath: Secondary | ICD-10-CM | POA: Diagnosis not present

## 2019-09-23 DIAGNOSIS — J849 Interstitial pulmonary disease, unspecified: Secondary | ICD-10-CM | POA: Diagnosis not present

## 2019-09-23 NOTE — Telephone Encounter (Signed)
Fax was received for pt's prolastin and was faxed back today 09/23/2019. Called [facility] and spoke with Tiara letting her know that it was faxed for her. Tiara verbalized understanding. Nothing further needed.

## 2019-10-24 ENCOUNTER — Telehealth: Payer: Self-pay | Admitting: Internal Medicine

## 2019-10-24 ENCOUNTER — Telehealth: Payer: Self-pay | Admitting: Cardiology

## 2019-10-24 DIAGNOSIS — J439 Emphysema, unspecified: Secondary | ICD-10-CM

## 2019-10-24 DIAGNOSIS — J8410 Pulmonary fibrosis, unspecified: Secondary | ICD-10-CM

## 2019-10-24 DIAGNOSIS — J84112 Idiopathic pulmonary fibrosis: Secondary | ICD-10-CM

## 2019-10-24 NOTE — Telephone Encounter (Signed)
Patient called stating that her ct showed coranary calicification. She wants to know if she needs to be seen or if her medication should be changed.

## 2019-10-24 NOTE — Telephone Encounter (Signed)
CT chst 09/23/2019 shows continued stability of ILD and emphysema since 2019. T his is good news. She does have  does have coronary artery calcification and review of chart shows  no normal cardiac stress test past few years; please refer to cardiologist - [place] or [facility] -  first available  Ensue fu with spirometry and dlco in 6 months to ILD clinic

## 2019-10-24 NOTE — Telephone Encounter (Signed)
Pt not seen here in over 3 years.  Is considered a new pt.  Needs to be scheduled per Dr Dillard Essex for f/u.

## 2019-10-24 NOTE — Telephone Encounter (Signed)
Called and spoke with pt letting her know the results of the CT and also in regards to coronary artery calcification seen on CT. Asked pt if she has a cardiologist and pt said that she sees Dr. Marlou Porch. Pt is already on atorvastatin which should help with the coronary artery calcification.  Stated to pt that we would have her follow up with MR in 6 months and she verbalized understanding. Recall placed and order placed for PFT in 6 months. Nothing further needed.

## 2019-10-24 NOTE — Telephone Encounter (Signed)
Attempted to contact pt by phone but NA and mailbox is full.

## 2019-10-28 NOTE — Telephone Encounter (Signed)
Spoke with patient who would like to schedule with Dr Marlou Porch for further evaluation.  appt scheduled for 12/18/2019 at 9 am.  Pt is aware of date, time and to bring medications and insurance cards.

## 2019-10-31 ENCOUNTER — Other Ambulatory Visit: Payer: Self-pay | Admitting: Gastroenterology

## 2019-11-19 ENCOUNTER — Telehealth: Payer: Self-pay | Admitting: Internal Medicine

## 2019-11-19 NOTE — Telephone Encounter (Signed)
ATC patient unable to reach LM to call back office (x1)  Will route to EP to follow up on if she received confirmation the patient was approved for medication

## 2019-11-20 NOTE — Telephone Encounter (Signed)
Raquel Sarna please advise if you've received anything on pt's Prolastin rx, or if this needs to be followed up on.  Thanks!

## 2019-11-20 NOTE — Telephone Encounter (Signed)
Checked MR's file in the pod and also up front and there is no fax in regards to pt's prolastin.  Called Prolastin [employer] at [phone] and was transferred to pt's coordinator Amber. Amber was unavailable at the time so I left message for her to return call.

## 2019-11-25 NOTE — Telephone Encounter (Signed)
lmtcb for [employer].

## 2019-11-26 NOTE — Telephone Encounter (Signed)
Called and spoke with [employer]  She states that the prolastin was approved and does not need PA  She will reach out to the pt to discuss delivery and copay assistance  I called and made pt aware  Nothing further needed

## 2019-12-11 ENCOUNTER — Telehealth: Payer: Self-pay | Admitting: Internal Medicine

## 2019-12-11 NOTE — Telephone Encounter (Signed)
LMTCB

## 2019-12-12 NOTE — Telephone Encounter (Signed)
Called and spoke to pt. Pt is questioning if she should get the covid vaccine. Advised pt that the choice is ultimately hers but because of her co-morbidities then she is at higher risk for complications if she were to contract the virus. If she were to get the vaccine then her likelihood of contraction is lower. Pt verbalized understanding and state she will get the vaccine on 2/1. Nothing further needed at this time.

## 2019-12-18 ENCOUNTER — Other Ambulatory Visit: Payer: Self-pay

## 2019-12-18 ENCOUNTER — Encounter: Payer: Self-pay | Admitting: Cardiology

## 2019-12-18 ENCOUNTER — Ambulatory Visit: Payer: PPO | Admitting: Cardiology

## 2019-12-18 ENCOUNTER — Encounter: Payer: Self-pay | Admitting: *Deleted

## 2019-12-18 VITALS — BP 142/78 | HR 60 | Ht 67.5 in | Wt 212.4 lb

## 2019-12-18 DIAGNOSIS — R0609 Other forms of dyspnea: Secondary | ICD-10-CM

## 2019-12-18 DIAGNOSIS — I2510 Atherosclerotic heart disease of native coronary artery without angina pectoris: Secondary | ICD-10-CM | POA: Diagnosis not present

## 2019-12-18 DIAGNOSIS — I10 Essential (primary) hypertension: Secondary | ICD-10-CM

## 2019-12-18 DIAGNOSIS — I700 Atherosclerosis of aorta: Secondary | ICD-10-CM | POA: Diagnosis not present

## 2019-12-18 DIAGNOSIS — R0600 Dyspnea, unspecified: Secondary | ICD-10-CM

## 2019-12-18 DIAGNOSIS — G4733 Obstructive sleep apnea (adult) (pediatric): Secondary | ICD-10-CM | POA: Diagnosis not present

## 2019-12-18 DIAGNOSIS — J8410 Pulmonary fibrosis, unspecified: Secondary | ICD-10-CM | POA: Diagnosis not present

## 2019-12-18 DIAGNOSIS — E119 Type 2 diabetes mellitus without complications: Secondary | ICD-10-CM | POA: Diagnosis not present

## 2019-12-18 NOTE — Patient Instructions (Signed)
Medication Instructions:   Your physician recommends that you continue on your current medications as directed. Please refer to the Current Medication list given to you today.  *If you need a refill on your cardiac medications before your next appointment, please call your pharmacy*    Testing/Procedures:  Your physician has requested that you have a lexiscan myoview. For further information please visit [email or web address]. Please follow instruction sheet, as given.    Follow-Up:  AS NEEDED WITH DR. Marlou Porch

## 2019-12-18 NOTE — Progress Notes (Signed)
Cardiology Office Note:    Date:  12/18/2019   ID:  Angela [name], DOB 23-Dec-1945, MRN [id number]  PCP:  Maurice Small, MD  Cardiologist:  No primary care provider on file.  Electrophysiologist:  None   Referring MD: Maurice Small, MD   No chief complaint on file. Here for the evaluation of coronary calcification seen on CT  History of Present Illness:    Angela [name] is a 74 y.o. female here for discussion of coronary calcium seen on lung screening CT scan by Dr. Chase Caller.  Has diabetes hypertension hyperlipidemia anxiety chronic kidney disease.  Treated for pulmonary fibrosis by Dr. Chase Caller.  I seen her last over 3 years ago after an episode of severe hypotension in the setting of antihypertensives.  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 Independently living.   COVID vaccine, Moderna  Over 10 years ago a nuclear stress test was performed in 2009 that was low risk with no ischemia.  Carotid Dopplers in 2014 showed no significant disease bilaterally.  In the past we had to decrease her metoprolol because of bradycardia.  Outside lab hemoglobin A1c 6.7, creatinine 1.09 potassium 4.5 ALT 19 LDL 74-almost at goal  No tobacco in 7 years.   Bilateral inner thigh discomfort at times.  Excellent palpable distal pulses in her feet.  Likely neuropathic.  She has had prior back surgeries.   Past Medical History:  Diagnosis Date  . Allergy   . Alpha 1-antitrypsin PiMS phenotype   . Anemia 2014   mild  . Anxiety   . Arthritis   . Asthma   . Blood transfusion without reported diagnosis   . Cataract   . Chronic kidney disease    early stages of some insufficiency  . Complication of anesthesia    passes out 24 hrs after getting  . COPD (chronic obstructive pulmonary disease) ([place])   . DDD (degenerative disc disease)   . Depression    with anxiety  . Diabetes mellitus   . Diverticulitis   . Diverticulosis   . Esophageal dysmotility 03/01/06   mild  .  Fibromyalgia   . GERD (gastroesophageal reflux disease)   . Glaucoma   . Heart murmur    "years ago"  . Hypercholesterolemia   . Hypertension   . IPF (idiopathic pulmonary fibrosis) ([place])   . Shingles   . Shortness of breath dyspnea   . Sleep apnea    wears CPAP  . Stroke [facility]) 2010   3 tia's    Past Surgical History:  Procedure Laterality Date  . ABDOMINAL HYSTERECTOMY    . ANTERIOR FUSION CERVICAL SPINE    . APPENDECTOMY    . BACK SURGERY     x4  . CARPAL TUNNEL RELEASE Bilateral   . COLONOSCOPY    . DE QUERVAIN'S RELEASE     right  . GASTRIC FUNDOPLICATION    . HERNIA REPAIR    . KNEE ARTHROSCOPY     left   . LUNG BIOPSY Left 03/23/2016   Procedure: LUNG BIOPSY;  Surgeon: Melrose Nakayama, MD;  Location: [facility];  Service: Thoracic;  Laterality: Left;  . LYMPH NODE BIOPSY Left 03/23/2016   Procedure: LYMPH NODE BIOPSY;  Surgeon: Melrose Nakayama, MD;  Location: [facility];  Service: Thoracic;  Laterality: Left;  . POSTERIOR CERVICAL FUSION/FORAMINOTOMY N/A 03/16/2015   Procedure: POSTERIOR CERVICAL LAMINECTOMY AND DISKECTOMOY AT CERVICAL SEVEN-THORACIC ONE ;POSTERIOR CERVICAL FUSION CERVICAL SIX TO THORACIC ONE;  Surgeon:  Kary Kos, MD;  Location: [place] [facility];  Service: Neurosurgery;  Laterality: N/A;  Site is Cervical/Thoracic  . TONSILLECTOMY    . UPPER GASTROINTESTINAL ENDOSCOPY    . VIDEO ASSISTED THORACOSCOPY Left 03/23/2016   Procedure: VIDEO ASSISTED THORACOSCOPY;  Surgeon: Melrose Nakayama, MD;  Location: [facility];  Service: Thoracic;  Laterality: Left;    Current Medications: Current Meds  Medication Sig  . acetaminophen (TYLENOL) 325 MG tablet Take 650 mg by mouth every 6 (six) hours as needed (joint pain/swelling).  Marland Kitchen albuterol (PROVENTIL) (2.5 MG/3ML) 0.083% nebulizer solution Take 3 mLs (2.5 mg total) by nebulization every 6 (six) hours as needed for wheezing or shortness of breath.  Marland Kitchen atorvastatin (LIPITOR) 40 MG tablet Take 1 tablet by mouth daily.  .  brimonidine (ALPHAGAN) 0.15 % ophthalmic solution Place 1 drop into both eyes 3 (three) times daily.  Marland Kitchen buPROPion (WELLBUTRIN XL) 300 MG 24 hr tablet Take 1 tablet by mouth every morning.  . cyclobenzaprine (FLEXERIL) 10 MG tablet Take 10 mg by mouth at bedtime as needed for muscle spasms.   Marland Kitchen glipiZIDE (GLUCOTROL XL) 5 MG 24 hr tablet TK 1 T PO QD  . hydrochlorothiazide (MICROZIDE) 12.5 MG capsule Take 1 capsule (12.5 mg total) by mouth daily.  Marland Kitchen latanoprost (XALATAN) 0.005 % ophthalmic solution 1 drop at bedtime. Left eye  . losartan (COZAAR) 50 MG tablet Take 50 mg by mouth daily.  . meclizine (ANTIVERT) 12.5 MG tablet Take 1 tablet (12.5 mg total) by mouth 3 (three) times daily as needed for dizziness.  . Menthol, Topical Analgesic, (BIOFREEZE) 4 % GEL Apply 1 application topically 3 (three) times daily as needed (for back pain).  . metFORMIN (GLUCOPHAGE-XR) 500 MG 24 hr tablet TK 2 TS PO BID  . metoprolol succinate (TOPROL-XL) 25 MG 24 hr tablet TAKE 1 TABLET(25 MG) BY MOUTH DAILY  . Multiple Vitamin (MULTIVITAMIN) capsule Take 1 capsule by mouth daily.  Marland Kitchen omeprazole (PRILOSEC) 40 MG capsule TAKE 1 CAPSULE(40 MG) BY MOUTH TWICE DAILY BEFORE LUNCH AND SUPPER  . ondansetron (ZOFRAN ODT) 4 MG disintegrating tablet Take 1 tablet (4 mg total) by mouth every 8 (eight) hours as needed for nausea or vomiting.  . predniSONE (DELTASONE) 10 MG tablet 40 mg x1 day, then 30 mg x1 day, then 20 mg x1 day, then 10 mg x1 day, and then 5 mg x1 day and stop  . PROLASTIN-C 1000 MG SOLR injection Inject 1,000 mg into the skin once a week.  . promethazine (PHENERGAN) 25 MG tablet Take 1 tablet (25 mg total) by mouth every 6 (six) hours as needed for nausea or vomiting.  . sertraline (ZOLOFT) 50 MG tablet Take 50 mg by mouth daily.  Marland Kitchen SPIRIVA HANDIHALER 18 MCG inhalation capsule PLACE 1 CAPSULE INTO INHALER AND INHALE DAILY  . traMADol (ULTRAM) 50 MG tablet Take 1-2 tablets (50-100 mg total) by mouth every 6 (six)  hours as needed (mild pain).     Allergies:   Tizanidine, Betadine [povidone iodine], Ciprofloxacin, Codeine, and Iohexol   Social History   Socioeconomic History  . Marital status: Divorced    Spouse name: Not on file  . Number of children: 2  . Years of education: Not on file  . Highest education level: Not on file  Occupational History  . Occupation: retired  Tobacco Use  . Smoking status: Former Smoker    Packs/day: 1.00    Years: 22.00    Pack years: 22.00  Types: Cigarettes    Quit date: 04/14/2012    Years since quitting: 7.6  . Smokeless tobacco: Never Used  . Tobacco comment: Quit 1 year ago in June 2013.    Substance and Sexual Activity  . Alcohol use: No    Alcohol/week: 0.0 standard drinks  . Drug use: No  . Sexual activity: Yes    Birth control/protection: None  Other Topics Concern  . Not on file  Social History Narrative  . Not on file   Social Determinants of Health   Financial Resource Strain:   . Difficulty of Paying Living Expenses: Not on file  Food Insecurity:   . Worried About Charity fundraiser in the Last Year: Not on file  . Ran Out of Food in the Last Year: Not on file  Transportation Needs:   . Lack of Transportation (Medical): Not on file  . Lack of Transportation (Non-Medical): Not on file  Physical Activity:   . Days of Exercise per Week: Not on file  . Minutes of Exercise per Session: Not on file  Stress:   . Feeling of Stress : Not on file  Social Connections:   . Frequency of Communication with Friends and Family: Not on file  . Frequency of Social Gatherings with Friends and Family: Not on file  . Attends Religious Services: Not on file  . Active Member of Clubs or Organizations: Not on file  . Attends Archivist Meetings: Not on file  . Marital Status: Not on file     Family History: The patient's family history includes COPD in her father; Cancer in her mother; Colon cancer in her maternal aunt; Lung cancer in  her mother; Stomach cancer in her maternal grandfather. There is no history of Esophageal cancer or Rectal cancer.  ROS:   Please see the history of present illness.    No fevers chills nausea vomiting syncope bleeding all other systems reviewed and are negative.  EKGs/Labs/Other Studies Reviewed:    The following studies were reviewed today:  CT of chest high resolution on 09/23/2019 was personally reviewed, images revealed aortic atherosclerosis as well as diffuse coronary artery calcification.  Report out as follows:There is aortic atherosclerosis, as well as atherosclerosis of the great vessels of the mediastinum and the coronary arteries, including calcified atherosclerotic plaque in the left main, left circumflex and right coronary arteries.  EKG:  EKG is ordered today.  The ekg ordered today demonstrates sinus rhythm 60 with nonspecific T wave flattening personally reviewed.  Recent Labs: No results found for requested labs within last 8760 hours.  Recent Lipid Panel    Component Value Date/Time   CHOL 119 04/14/2013 0700   TRIG 125 04/14/2013 0700   HDL 41 04/14/2013 0700   CHOLHDL 2.9 04/14/2013 0700   VLDL 25 04/14/2013 0700   LDLCALC 53 04/14/2013 0700    Physical Exam:    VS:  BP (!) 142/78   Pulse 60   Ht 5' 7.5" (1.715 m)   Wt 212 lb 6.4 oz (96.3 kg)   LMP  (LMP Unknown)   SpO2 94%   BMI 32.78 kg/m     Wt Readings from Last 3 Encounters:  12/18/19 212 lb 6.4 oz (96.3 kg)  12/16/18 211 lb (95.7 kg)  08/02/18 214 lb (97.1 kg)     GEN:  Well nourished, well developed in no acute distress HEENT: Normal NECK: No JVD; No carotid bruits LYMPHATICS: No lymphadenopathy CARDIAC: RRR, no murmurs, rubs, gallops  RESPIRATORY: Very minimal crackles heard bilaterally lung fields ABDOMEN: Soft, non-tender, non-distended MUSCULOSKELETAL:  No edema; No deformity  SKIN: Warm and dry NEUROLOGIC:  Alert and oriented x 3 PSYCHIATRIC:  Normal affect   ASSESSMENT:     1. Diabetes mellitus with coincident hypertension ([place])   2. Coronary artery disease involving native coronary artery of native heart without angina pectoris   3. Pulmonary fibrosis ([place])   4. Dyspnea on exertion   5. Aortic atherosclerosis ([facility])    PLAN:    In order of problems listed above:  Coronary artery calcification/aortic atherosclerosis -Statin therapy for secondary prevention, plaque stabilization with LDL goal less than 70.  Continue with high intensity dose atorvastatin 40 mg once a day.  No myalgias with medications. -It has been over 10 years since evaluation with stress test has been performed.  Given her evidence of coronary artery disease as well as shortness of breath, pulmonary fibrosis likely related, I think we should check a pharmacologic Lexiscan to make sure that there is no high risk lesions present.  Pulmonary fibrosis -Dr. Chase Caller has been following for her shortness of breath.  Diabetes with hypertension -Monitored closely by Dr. Justin Mend, her primary physician.  Hemoglobin A1c reviewed as above.  Remember, diabetes is a coronary artery disease equivalent and we treat aggressively with prevention measures.  Continue with diet, exercise.  Checking stress test as above given her coronary artery calcification seen on CT.  Hyperlipidemia -Excellent use of atorvastatin high intensity dose 40 mg a day with LDL of 74.  Goal is 70.  Continue.  She can come back and see [place] on as-needed basis unless there is something unusual with stress test.  She is doing very well from a prevention standpoint.   Medication Adjustments/Labs and Tests Ordered: Current medicines are reviewed at length with the patient today.  Concerns regarding medicines are outlined above.  Orders Placed This Encounter  Procedures  . Myocardial Perfusion Imaging  . EKG 12-Lead   No orders of the defined types were placed in this encounter.   Patient Instructions  Medication Instructions:    Your physician recommends that you continue on your current medications as directed. Please refer to the Current Medication list given to you today.  *If you need a refill on your cardiac medications before your next appointment, please call your pharmacy*    Testing/Procedures:  Your physician has requested that you have a lexiscan myoview. For further information please visit [email or web address]. Please follow instruction sheet, as given.    Follow-Up:  AS NEEDED WITH DR. Marlou Porch     Signed, Candee Furbish, MD  12/18/2019 10:26 AM    [place]

## 2019-12-26 ENCOUNTER — Telehealth (HOSPITAL_COMMUNITY): Payer: Self-pay

## 2019-12-26 NOTE — Telephone Encounter (Signed)
Spoke with the patient, instructions given. She stated that she would be here for here test. Asked to call back qith any questions. S.Lynn Sissel EMTP

## 2019-12-29 ENCOUNTER — Encounter (HOSPITAL_COMMUNITY): Payer: Self-pay | Admitting: Emergency Medicine

## 2019-12-29 ENCOUNTER — Other Ambulatory Visit: Payer: Self-pay

## 2019-12-29 ENCOUNTER — Emergency Department (HOSPITAL_COMMUNITY)
Admission: EM | Admit: 2019-12-29 | Discharge: 2019-12-30 | Disposition: A | Discharge status: Home / Self Care | Payer: PPO | Attending: Emergency Medicine | Admitting: Emergency Medicine

## 2019-12-29 DIAGNOSIS — I129 Hypertensive chronic kidney disease with stage 1 through stage 4 chronic kidney disease, or unspecified chronic kidney disease: Secondary | ICD-10-CM | POA: Diagnosis not present

## 2019-12-29 DIAGNOSIS — I10 Essential (primary) hypertension: Secondary | ICD-10-CM | POA: Diagnosis not present

## 2019-12-29 DIAGNOSIS — R531 Weakness: Secondary | ICD-10-CM | POA: Diagnosis not present

## 2019-12-29 DIAGNOSIS — Z87891 Personal history of nicotine dependence: Secondary | ICD-10-CM | POA: Diagnosis not present

## 2019-12-29 DIAGNOSIS — Z79899 Other long term (current) drug therapy: Secondary | ICD-10-CM | POA: Insufficient documentation

## 2019-12-29 DIAGNOSIS — E1122 Type 2 diabetes mellitus with diabetic chronic kidney disease: Secondary | ICD-10-CM | POA: Insufficient documentation

## 2019-12-29 DIAGNOSIS — J45909 Unspecified asthma, uncomplicated: Secondary | ICD-10-CM | POA: Diagnosis not present

## 2019-12-29 DIAGNOSIS — R509 Fever, unspecified: Secondary | ICD-10-CM | POA: Diagnosis not present

## 2019-12-29 DIAGNOSIS — J449 Chronic obstructive pulmonary disease, unspecified: Secondary | ICD-10-CM | POA: Diagnosis not present

## 2019-12-29 DIAGNOSIS — R197 Diarrhea, unspecified: Secondary | ICD-10-CM | POA: Diagnosis not present

## 2019-12-29 DIAGNOSIS — Z209 Contact with and (suspected) exposure to unspecified communicable disease: Secondary | ICD-10-CM | POA: Diagnosis not present

## 2019-12-29 DIAGNOSIS — U071 COVID-19: Secondary | ICD-10-CM | POA: Diagnosis not present

## 2019-12-29 DIAGNOSIS — N189 Chronic kidney disease, unspecified: Secondary | ICD-10-CM | POA: Diagnosis not present

## 2019-12-29 DIAGNOSIS — Z8673 Personal history of transient ischemic attack (TIA), and cerebral infarction without residual deficits: Secondary | ICD-10-CM | POA: Diagnosis not present

## 2019-12-29 DIAGNOSIS — R0602 Shortness of breath: Secondary | ICD-10-CM | POA: Diagnosis not present

## 2019-12-29 DIAGNOSIS — R05 Cough: Secondary | ICD-10-CM | POA: Diagnosis present

## 2019-12-29 DIAGNOSIS — I959 Hypotension, unspecified: Secondary | ICD-10-CM | POA: Diagnosis not present

## 2019-12-29 NOTE — ED Provider Notes (Addendum)
[facility] DEPT Provider Note   CSN: [id number] Arrival date & time: 12/29/19  2307   History Chief Complaint  Patient presents with  . Influenza    Angela [name] is a 74 y.o. female.  The history is provided by the patient.  Influenza She has history of hypertension, diabetes, hyperlipidemia, pulmonary fibrosis, alpha-1 antitrypsin deficiency and comes in with cough and fever for the last 2 days.  Symptoms started when power went out in her home.  Cough is productive of clear sputum.  She has had fevers as high as 100 degrees with associated chills but no sweats.  She is complaining of generalized body aches.  She denies loss of smell or taste.  There has been no vomiting or diarrhea.  She states that she has no appetite and feels generally weak.  She denies any sick contacts and specifically denies any exposure to COVID-19.  She has received the influenza immunization this season and received her first dose of the Moderna COVID-19 vaccine 2 weeks ago.  Past Medical History:  Diagnosis Date  . Allergy   . Alpha 1-antitrypsin PiMS phenotype   . Anemia 2014   mild  . Anxiety   . Arthritis   . Asthma   . Blood transfusion without reported diagnosis   . Cataract   . Chronic kidney disease    early stages of some insufficiency  . Complication of anesthesia    passes out 24 hrs after getting  . COPD (chronic obstructive pulmonary disease) ([place])   . DDD (degenerative disc disease)   . Depression    with anxiety  . Diabetes mellitus   . Diverticulitis   . Diverticulosis   . Esophageal dysmotility 03/01/06   mild  . Fibromyalgia   . GERD (gastroesophageal reflux disease)   . Glaucoma   . Heart murmur    "years ago"  . Hypercholesterolemia   . Hypertension   . IPF (idiopathic pulmonary fibrosis) ([place])   . Shingles   . Shortness of breath dyspnea   . Sleep apnea    wears CPAP  . Stroke [facility]) 2010   3 tia's    Patient Active Problem List   Diagnosis Date Noted  . Needs flu shot 09/13/2017  . Upper airway cough syndrome 11/15/2016  . Encounter for therapeutic drug monitoring 07/07/2016  . IPF (idiopathic pulmonary fibrosis) ([place]) 04/14/2016  . COPD exacerbation ([place]) 09/09/2015  . Alpha-1-antitrypsin deficiency ([place]) 09/09/2015  . Pulmonary emphysema with fibrosis of lung ([place]) 09/09/2015  . ILD (interstitial lung disease) ([place]) 09/09/2015  . Smoking history 08/11/2015  . Family history of alpha 1 antitrypsin deficiency 08/11/2015  . Dyspnea and respiratory abnormality 08/11/2015  . HNP (herniated nucleus pulposus) 03/16/2015  . Obesity 02/05/2014  . Internal hemorrhoid 08/23/2013  . RUQ abdominal pain 08/23/2013  . Nonspecific abnormal finding in stool contents 08/23/2013  . Abdominal pain, epigastric 06/03/2013  . Acute diverticulitis 05/29/2013  . Abdominal pain, left lower quadrant 05/29/2013  . Nausea and vomiting 05/29/2013  . Leukocytosis 05/29/2013  . Acute renal failure ([place]) 05/29/2013  . Anemia 05/29/2013  . Diabetes ([place]) 05/29/2013  . Dyslipidemia 05/29/2013  . HTN (hypertension) 04/14/2013    Past Surgical History:  Procedure Laterality Date  . ABDOMINAL HYSTERECTOMY    . ANTERIOR FUSION CERVICAL SPINE    . APPENDECTOMY    . BACK SURGERY     x4  . CARPAL TUNNEL RELEASE Bilateral   . COLONOSCOPY    . DE  QUERVAIN'S RELEASE     right  . GASTRIC FUNDOPLICATION    . HERNIA REPAIR    . KNEE ARTHROSCOPY     left   . LUNG BIOPSY Left 03/23/2016   Procedure: LUNG BIOPSY;  Surgeon: Melrose Nakayama, MD;  Location: [facility];  Service: Thoracic;  Laterality: Left;  . LYMPH NODE BIOPSY Left 03/23/2016   Procedure: LYMPH NODE BIOPSY;  Surgeon: Melrose Nakayama, MD;  Location: [facility];  Service: Thoracic;  Laterality: Left;  . POSTERIOR CERVICAL FUSION/FORAMINOTOMY N/A 03/16/2015   Procedure: POSTERIOR CERVICAL LAMINECTOMY AND DISKECTOMOY AT CERVICAL SEVEN-THORACIC ONE ;POSTERIOR CERVICAL FUSION CERVICAL  SIX TO THORACIC ONE;  Surgeon: Kary Kos, MD;  Location: [facility];  Service: Neurosurgery;  Laterality: N/A;  Site is Cervical/Thoracic  . TONSILLECTOMY    . UPPER GASTROINTESTINAL ENDOSCOPY    . VIDEO ASSISTED THORACOSCOPY Left 03/23/2016   Procedure: VIDEO ASSISTED THORACOSCOPY;  Surgeon: Melrose Nakayama, MD;  Location: [facility];  Service: Thoracic;  Laterality: Left;     OB History   No obstetric history on file.     Family History  Problem Relation Age of Onset  . Cancer Mother        brain  . Lung cancer Mother   . COPD Father   . Stomach cancer Maternal Grandfather   . Colon cancer Maternal Aunt   . Esophageal cancer Neg Hx   . Rectal cancer Neg Hx     Social History   Tobacco Use  . Smoking status: Former Smoker    Packs/day: 1.00    Years: 22.00    Pack years: 22.00    Types: Cigarettes    Quit date: 04/14/2012    Years since quitting: 7.7  . Smokeless tobacco: Never Used  . Tobacco comment: Quit 1 year ago in June 2013.    Substance Use Topics  . Alcohol use: No    Alcohol/week: 0.0 standard drinks  . Drug use: No    Home Medications Prior to Admission medications   Medication Sig Start Date End Date Taking? Authorizing Provider  acetaminophen (TYLENOL) 325 MG tablet Take 650 mg by mouth every 6 (six) hours as needed (joint pain/swelling).    [provider]  albuterol (PROVENTIL) (2.5 MG/3ML) 0.083% nebulizer solution Take 3 mLs (2.5 mg total) by nebulization every 6 (six) hours as needed for wheezing or shortness of breath. 07/05/18   Brand Males, MD  atorvastatin (LIPITOR) 40 MG tablet Take 1 tablet by mouth daily. 11/15/16   [provider]  brimonidine (ALPHAGAN) 0.15 % ophthalmic solution Place 1 drop into both eyes 3 (three) times daily. 07/12/17   [provider]  buPROPion (WELLBUTRIN XL) 300 MG 24 hr tablet Take 1 tablet by mouth every morning. 05/04/16   [provider]  cyclobenzaprine (FLEXERIL) 10 MG  tablet Take 10 mg by mouth at bedtime as needed for muscle spasms.  06/09/15   [provider]  glipiZIDE (GLUCOTROL XL) 5 MG 24 hr tablet TK 1 T PO QD 11/21/17   [provider]  hydrochlorothiazide (MICROZIDE) 12.5 MG capsule Take 1 capsule (12.5 mg total) by mouth daily. 05/22/17   Jerline Pain, MD  latanoprost (XALATAN) 0.005 % ophthalmic solution 1 drop at bedtime. Left eye    [provider]  losartan (COZAAR) 50 MG tablet Take 50 mg by mouth daily. 11/24/19   [provider]  meclizine (ANTIVERT) 12.5 MG tablet Take 1 tablet (12.5 mg total) by mouth  3 (three) times daily as needed for dizziness. 12/16/18   Palumbo, April, MD  Menthol, Topical Analgesic, (BIOFREEZE) 4 % GEL Apply 1 application topically 3 (three) times daily as needed (for back pain).    [provider]  metFORMIN (GLUCOPHAGE-XR) 500 MG 24 hr tablet TK 2 TS PO BID 01/25/18   [provider]  metoprolol succinate (TOPROL-XL) 25 MG 24 hr tablet TAKE 1 TABLET(25 MG) BY MOUTH DAILY 05/22/17   Jerline Pain, MD  Multiple Vitamin (MULTIVITAMIN) capsule Take 1 capsule by mouth daily.    [provider]  omeprazole (PRILOSEC) 40 MG capsule TAKE 1 CAPSULE(40 MG) BY MOUTH TWICE DAILY BEFORE LUNCH AND SUPPER 11/01/19   Mansouraty, Telford Nab., MD  ondansetron (ZOFRAN ODT) 4 MG disintegrating tablet Take 1 tablet (4 mg total) by mouth every 8 (eight) hours as needed for nausea or vomiting. 07/25/15   Little, Wenda Overland, MD  predniSONE (DELTASONE) 10 MG tablet 40 mg x1 day, then 30 mg x1 day, then 20 mg x1 day, then 10 mg x1 day, and then 5 mg x1 day and stop 08/29/19   Brand Males, MD  PROLASTIN-C 1000 MG SOLR injection Inject 1,000 mg into the skin once a week. 07/12/17   [provider]  promethazine (PHENERGAN) 25 MG tablet Take 1 tablet (25 mg total) by mouth every 6 (six) hours as needed for nausea or vomiting. 08/04/17   Virgel Manifold, MD  sertraline (ZOLOFT) 50 MG  tablet Take 50 mg by mouth daily.    [provider]  SPIRIVA HANDIHALER 18 MCG inhalation capsule PLACE 1 CAPSULE INTO INHALER AND INHALE DAILY 04/12/19   Brand Males, MD  traMADol (ULTRAM) 50 MG tablet Take 1-2 tablets (50-100 mg total) by mouth every 6 (six) hours as needed (mild pain). 03/26/16   Barrett, Erin R, PA-C    Allergies    Tizanidine, Betadine [povidone iodine], Ciprofloxacin, Codeine, and Iohexol  Review of Systems   Review of Systems  All other systems reviewed and are negative.   Physical Exam Updated Vital Signs LMP  (LMP Unknown)   SpO2 96%   Physical Exam Vitals and nursing note reviewed.   74 year old female, resting comfortably and in no acute distress. Vital signs are significant for low blood pressure and borderline high temperature. Oxygen saturation is 97%, which is normal. Head is normocephalic and atraumatic. PERRLA, EOMI. Oropharynx is clear. Neck is nontender and supple without adenopathy or JVD. Back is nontender and there is no CVA tenderness. Lungs are clear without rales, wheezes, or rhonchi. Chest is nontender. Heart has regular rate and rhythm without murmur. Abdomen is soft, flat, nontender without masses or hepatosplenomegaly and peristalsis is normoactive. Extremities have no cyanosis or edema, full range of motion is present. Skin is warm and dry without rash. Neurologic: Mental status is normal, cranial nerves are intact, there are no motor or sensory deficits.  ED Results / Procedures / Treatments   Labs (all labs ordered are listed, but only abnormal results are displayed) Labs Reviewed  RESPIRATORY PANEL BY RT PCR (FLU A&B, COVID) - Abnormal; Notable for the following components:      Result Value   SARS Coronavirus 2 by RT PCR POSITIVE (*)    All other components within normal limits  COMPREHENSIVE METABOLIC PANEL - Abnormal; Notable for the following components:   CO2 21 (*)    Glucose, Bld 136 (*)    Creatinine,  Ser 1.42 (*)    GFR  calc non Af Amer 37 (*)    GFR calc Af Amer 43 (*)    All other components within normal limits  CBC WITH DIFFERENTIAL/PLATELET - Abnormal; Notable for the following components:   Monocytes Absolute 1.1 (*)    All other components within normal limits  CULTURE, BLOOD (ROUTINE X 2)  CULTURE, BLOOD (ROUTINE X 2)  URINE CULTURE  LACTIC ACID, PLASMA  APTT  PROTIME-INR  LACTIC ACID, PLASMA  URINALYSIS, ROUTINE W REFLEX MICROSCOPIC    EKG EKG Interpretation  Date/Time:  Sunday December 29 2019 23:25:36 EST Ventricular Rate:  75 PR Interval:    QRS Duration: 87 QT Interval:  443 QTC Calculation: 495 R Axis:   -35 Text Interpretation: Sinus rhythm Left axis deviation Abnormal R-wave progression, late transition Borderline repol abnrm, anterolateral leads Borderline prolonged QT interval Baseline wander in lead(s) V1 V2 When compared with ECG of 12/16/2018, QT has lengthened Confirmed by Delora Fuel ([id number]) on 12/29/2019 11:37:39 PM   Radiology DG Chest Port 1 View  Result Date: 12/30/2019 CLINICAL DATA:  Cough and fever. Shortness of breath. EXAM: PORTABLE CHEST 1 VIEW COMPARISON:  High-resolution chest CT 09/23/2019, chest radiograph 07/04/2019 FINDINGS: Unchanged heart size and mediastinal contours. Interstitial coarsening with interstitial lung disease, similar to prior radiographs. No evidence of superimposed airspace consolidation. No pulmonary edema, pleural fluid, or pneumothorax. Surgical hardware in the lower cervical spine is partially included. IMPRESSION: Stable appearance of the chest with interstitial lung disease. No evidence of pneumonia or acute findings. Electronically Signed   By: Keith Rake M.D.   On: 12/30/2019 00:21    Procedures Procedures  CRITICAL CARE Performed by: Delora Fuel Total critical care time: 40 minutes Critical care time was exclusive of separately billable procedures and treating other patients. Critical care was  necessary to treat or prevent imminent or life-threatening deterioration. Critical care was time spent personally by me on the following activities: development of treatment plan with patient and/or surrogate as well as nursing, discussions with consultants, evaluation of patient's response to treatment, examination of patient, obtaining history from patient or surrogate, ordering and performing treatments and interventions, ordering and review of laboratory studies, ordering and review of radiographic studies, pulse oximetry and re-evaluation of patient's condition.  Medications Ordered in ED Medications  lactated ringers bolus 1,000 mL (0 mLs Intravenous Stopped 12/30/19 0204)    ED Course  I have reviewed the triage vital signs and the nursing notes.  Pertinent labs & imaging results that were available during my care of the patient were reviewed by me and considered in my medical decision making (see chart for details).  MDM Rules/Calculators/A&P Influenza-like illness.  In the setting of [id number] pandemic, certainly need to consider possibility of COVID-19 infection.  Also, need to check chest x-ray to rule out pneumonia.  Because of low blood pressure, sepsis work-up was initiated, but patient does not appear toxic in any way and does not appear septic.  We will hold off on antibiotics for now until chest x-ray is back.  Swab is sent to test for influenza and COVID-19.  Repeat blood pressure did come up slightly but is still below her usual readings.  Old records are reviewed, confirming history of COPD which is managed mainly as an outpatient, no recent admissions for COPD, pulmonary fibrosis, pneumonia.  Chest x-ray shows no acute process.  ECG shows slight QT prolongation compared with prior, but not to a dangerous level.  Labs are reassuring with normal lactic acid level, normal WBC,  essentially normal chemistry panel.  COVID-19 swab is positive, so current symptoms appear to be secondary to  COVID-19.  Blood pressure has come back up to her normal level of with hydration.  Heart rate has continued to be normal.  She is felt to be safe for discharge.  Advised to return should she develop worsening shortness of breath.  Second dose of her vaccine is scheduled for March 1.  She should be able to proceed with that vaccine dose on schedule unless she has additional problems before then.  Angela [name] was evaluated in Emergency Department on 12/29/2019 for the symptoms described in the history of present illness. She was evaluated in the context of the global COVID-19 pandemic, which necessitated consideration that the patient might be at risk for infection with the SARS-CoV-2 virus that causes COVID-19. Institutional protocols and algorithms that pertain to the evaluation of patients at risk for COVID-19 are in a state of rapid change based on information released by regulatory bodies including the CDC and federal and state organizations. These policies and algorithms were followed during the patient's care in the ED.  Final Clinical Impression(s) / ED Diagnoses Final diagnoses:  COVID-19 virus infection    Rx / DC Orders ED Discharge Orders    None       Delora Fuel, MD [id number]    Delora Fuel, MD [id number] [phone]

## 2019-12-29 NOTE — ED Triage Notes (Signed)
Pt from home call EMS with c/o cough fever SOB weakness and diarrhea since Friday past.

## 2019-12-30 ENCOUNTER — Emergency Department (HOSPITAL_COMMUNITY): Payer: PPO

## 2019-12-30 DIAGNOSIS — U071 COVID-19: Secondary | ICD-10-CM | POA: Diagnosis not present

## 2019-12-30 DIAGNOSIS — R0602 Shortness of breath: Secondary | ICD-10-CM | POA: Diagnosis not present

## 2019-12-30 DIAGNOSIS — R509 Fever, unspecified: Secondary | ICD-10-CM | POA: Diagnosis not present

## 2019-12-30 DIAGNOSIS — R05 Cough: Secondary | ICD-10-CM | POA: Diagnosis not present

## 2019-12-30 DIAGNOSIS — I10 Essential (primary) hypertension: Secondary | ICD-10-CM | POA: Diagnosis not present

## 2019-12-30 LAB — COMPREHENSIVE METABOLIC PANEL
ALT: 34 U/L (ref 0–44)
AST: 30 U/L (ref 15–41)
Albumin: 3.9 g/dL (ref 3.5–5.0)
Alkaline Phosphatase: 107 U/L (ref 38–126)
Anion gap: 11 (ref 5–15)
BUN: 19 mg/dL (ref 8–23)
CO2: 21 mmol/L — ABNORMAL LOW (ref 22–32)
Calcium: 9.2 mg/dL (ref 8.9–10.3)
Chloride: 103 mmol/L (ref 98–111)
Creatinine, Ser: 1.42 mg/dL — ABNORMAL HIGH (ref 0.44–1.00)
GFR calc Af Amer: 43 mL/min — ABNORMAL LOW (ref >60)
GFR calc non Af Amer: 37 mL/min — ABNORMAL LOW (ref >60)
Glucose, Bld: 136 mg/dL — ABNORMAL HIGH (ref 70–99)
Potassium: 3.9 mmol/L (ref 3.5–5.1)
Sodium: 135 mmol/L (ref 135–145)
Total Bilirubin: 0.4 mg/dL (ref 0.3–1.2)
Total Protein: 7.3 g/dL (ref 6.5–8.1)

## 2019-12-30 LAB — CBC WITH DIFFERENTIAL/PLATELET
Abs Immature Granulocytes: 0.04 10*3/uL (ref 0.00–0.07)
Basophils Absolute: 0.1 10*3/uL (ref 0.0–0.1)
Basophils Relative: 1 %
Eosinophils Absolute: 0.1 10*3/uL (ref 0.0–0.5)
Eosinophils Relative: 1 %
HCT: 39.9 % (ref 36.0–46.0)
Hemoglobin: 12.7 g/dL (ref 12.0–15.0)
Immature Granulocytes: 0 %
Lymphocytes Relative: 17 %
Lymphs Abs: 1.6 10*3/uL (ref 0.7–4.0)
MCH: 28.3 pg (ref 26.0–34.0)
MCHC: 31.8 g/dL (ref 30.0–36.0)
MCV: 89.1 fL (ref 80.0–100.0)
Monocytes Absolute: 1.1 10*3/uL — ABNORMAL HIGH (ref 0.1–1.0)
Monocytes Relative: 11 %
Neutro Abs: 6.8 10*3/uL (ref 1.7–7.7)
Neutrophils Relative %: 70 %
Platelets: 248 10*3/uL (ref 150–400)
RBC: 4.48 MIL/uL (ref 3.87–5.11)
RDW: 13.2 % (ref 11.5–15.5)
WBC: 9.7 10*3/uL (ref 4.0–10.5)
nRBC: 0 % (ref 0.0–0.2)

## 2019-12-30 LAB — LACTIC ACID, PLASMA
Lactic Acid, Venous: 0.9 mmol/L (ref 0.5–1.9)
Lactic Acid, Venous: 1.6 mmol/L (ref 0.5–1.9)

## 2019-12-30 LAB — RESPIRATORY PANEL BY RT PCR (FLU A&B, COVID)
Influenza A by PCR: NEGATIVE
Influenza B by PCR: NEGATIVE
SARS Coronavirus 2 by RT PCR: POSITIVE — AB

## 2019-12-30 LAB — PROTIME-INR
INR: 1 (ref 0.8–1.2)
Prothrombin Time: 12.6 seconds (ref 11.4–15.2)

## 2019-12-30 LAB — APTT: aPTT: 29 seconds (ref 24–36)

## 2019-12-30 MED ORDER — LACTATED RINGERS IV BOLUS
1000.0000 mL | Freq: Once | INTRAVENOUS | Status: AC
  Administered 2019-12-30: 01:00:00 1000 mL via INTRAVENOUS

## 2019-12-30 NOTE — Discharge Instructions (Signed)
Continue all of your usual medications.  Take acetaminophen as needed for fever or aching.  Return if your breathing is getting worse.  You should be able to get the second COVID vaccine on March 1, as scheduled.

## 2019-12-30 NOTE — ED Notes (Signed)
Pt was unable to provide a urine sample at this time ?

## 2019-12-30 NOTE — ED Notes (Signed)
Date and time results received: 12/30/19 0208 (use smartphrase ".now" to insert current time)  Test: COVID Critical Value: Positive  Name of Provider Notified: Dr. Roxanne Mins  Orders Received? Or Actions Taken?: Orders Received - See Orders for details

## 2019-12-31 ENCOUNTER — Telehealth (INDEPENDENT_AMBULATORY_CARE_PROVIDER_SITE_OTHER): Payer: PPO | Admitting: Adult Health

## 2019-12-31 ENCOUNTER — Telehealth: Payer: Self-pay | Admitting: Adult Health

## 2019-12-31 ENCOUNTER — Other Ambulatory Visit: Payer: Self-pay | Admitting: Adult Health

## 2019-12-31 ENCOUNTER — Telehealth: Payer: Self-pay | Admitting: Unknown Physician Specialty

## 2019-12-31 ENCOUNTER — Encounter: Payer: Self-pay | Admitting: Adult Health

## 2019-12-31 ENCOUNTER — Encounter (HOSPITAL_COMMUNITY): Payer: PPO

## 2019-12-31 ENCOUNTER — Telehealth: Payer: Self-pay | Admitting: Internal Medicine

## 2019-12-31 DIAGNOSIS — J8410 Pulmonary fibrosis, unspecified: Secondary | ICD-10-CM

## 2019-12-31 DIAGNOSIS — J84112 Idiopathic pulmonary fibrosis: Secondary | ICD-10-CM

## 2019-12-31 DIAGNOSIS — U071 COVID-19: Secondary | ICD-10-CM | POA: Diagnosis not present

## 2019-12-31 DIAGNOSIS — J439 Emphysema, unspecified: Secondary | ICD-10-CM

## 2019-12-31 NOTE — Telephone Encounter (Signed)
Called todiscuss with Leilani Able. Stieber about SARS-CoV-2 positive test, COVID symptoms and the use of bamlanivimab, a monoclonal antibody infusion for those with mild to moderate Covid symptoms and at a high risk of hospitalization.   Pt is qualified for this infusion at the [facility] infusion center due to co-morbid conditions and/or a member of an at-risk group.  ED Note on 2/14/20201, sx onset 12/27/2019  Message left to call back  Mina Marble, NP-C

## 2019-12-31 NOTE — Patient Instructions (Addendum)
I am setting you up for a monoclonal antibody infusion which has been scheduled for January 01, 2020 at 10:30 PM at our [facility], address is [address] [address] daily You may proceed with your Prolastin infusion on February 19 if you are doing well Please check oxygen levels once or twice a day and as needed please call if O2 saturations are dropping or get below 90% Activity as tolerated Fluids and rest Follow-up in 1 week and as needed for televisit/video visit  Please contact office for sooner follow up if symptoms do not improve or worsen or seek emergency care

## 2019-12-31 NOTE — Progress Notes (Signed)
  I connected by phone with Angela [name] on 12/31/2019 at 4:10 PM to discuss the potential use of an new treatment for mild to moderate COVID-19 viral infection in non-hospitalized patients.  This patient is a 74 y.o. female that meets the FDA criteria for Emergency Use Authorization of bamlanivimab or casirivimab\imdevimab.  Has a (+) direct SARS-CoV-2 viral test result  Has mild or moderate COVID-19   Is ? 72 years of age and weighs ? 40 kg  Is NOT hospitalized due to COVID-19  Is NOT requiring oxygen therapy or requiring an increase in baseline oxygen flow rate due to COVID-19  Is within 10 days of symptom onset  Has at least one of the high risk factor(s) for progression to severe COVID-19 and/or hospitalization as defined in EUA.  Specific high risk criteria : >/= 72 yo   I have spoken and communicated the following to the patient or parent/caregiver:  1. FDA has authorized the emergency use of bamlanivimab and casirivimab\imdevimab for the treatment of mild to moderate COVID-19 in adults and pediatric patients with positive results of direct SARS-CoV-2 viral testing who are 8 years of age and older weighing at least 40 kg, and who are at high risk for progressing to severe COVID-19 and/or hospitalization.  2. The significant known and potential risks and benefits of bamlanivimab and casirivimab\imdevimab, and the extent to which such potential risks and benefits are unknown.  3. Information on available alternative treatments and the risks and benefits of those alternatives, including clinical trials.  4. Patients treated with bamlanivimab and casirivimab\imdevimab should continue to self-isolate and use infection control measures (e.g., wear mask, isolate, social distance, avoid sharing personal items, clean and disinfect "high touch" surfaces, and frequent handwashing) according to CDC guidelines.   5. The patient or parent/caregiver has the option to accept or refuse  bamlanivimab or casirivimab\imdevimab .  After reviewing this information with the patient, The patient agreed to proceed with receiving the bamlanimivab infusion and will be provided a copy of the Fact sheet prior to receiving the infusion..  Scheduled for 01/01/20 at 1030  [name] 12/31/2019 4:10 PM

## 2019-12-31 NOTE — Telephone Encounter (Signed)
Called to discuss with patient about Covid symptoms and the use of bamlanivimab, a monoclonal antibody infusion for those with mild to moderate Covid symptoms and at a high risk of hospitalization.  Pt is qualified for this infusion at the [facility] infusion center due to Age > 99 and multiple co-morbidities.     Message left to call back

## 2019-12-31 NOTE — Telephone Encounter (Signed)
Called and spoke with pt who stated she tested positive for covid 2/13. Pt also has some complaints of wheezing and chest tightness.  Pt does have complaints of SOB and has been using her nebulizer doing tx every 4 hours. Pt is also taking all other meds as directed.  Pt states she has not had a fever or headache since 2 days ago. Pt stated the last time she took tylenol was yesterday 2/15.  Pt denies any current issues of body aches or chills as that has begun to subside.    Pt was supposed to have had a prolastin infusion yesterday 2/15 but did not have it. Pt stated she received a call from the prolastin nurse stating that she felt like it would be okay for her to still receive her infusions but wanted to hear from [place] if it would be okay.  Pt also stated she had received a call from hosp about the infusion for covid and I stated to pt that we should get her scheduled for a video visit to further discuss everything and she verbalized understanding. Pt has been scheduled for a video visit with TP today at 3:30. Nothing further needed.

## 2019-12-31 NOTE — Progress Notes (Addendum)
Virtual Visit via Televisit  Note  I connected with Angela [name] on 12/31/19 at  3:30 PM EST by a video enabled telemedicine application and verified that I am speaking with the correct person using two identifiers.  Location: Patient: Home Provider: Office   I discussed the limitations of evaluation and management by telemedicine and the availability of in person appointments. The patient expressed understanding and agreed to proceed.  History of Present Illness: 74 year old female former smoker followed for emphysema (MZ phenotype) and idiopathic pulmonary fibrosis  Today's televisit is a follow-up from recent emergency room visit.  Patient has been diagnosed with COVID-19 infection.  Patient was seen on the emergency room on December 29, 2019 with a 2-day history of cough and fever.  Cough is minimally productive with clear mucus.  She had T-max fever of 100 degrees.  She has no loss of taste or smell.  Appetite has been low but no nausea vomiting or diarrhea.  She received the majority of vaccine 2-1/2 weeks ago chest x-ray on February 14 showed Stable ILD changes with no acute pneumonia.  Lab work was unrevealing except for slight bump in her serum creatinine at 1.4 COVID-19 was positive.  Influenza negative Blood cultures have been negative to date.  Since emergency room visit patient is feeling Current symptoms are shortness of breath and malaise and body aches. Loss appetite .  Oxygen level at home are 94% -that is her baseline on room air. Not on home oxygen.    She remains on Prolastin C.  She is on Spiriva daily.  Has albuterol inhaler and nebulizer to use as needed.  She says she is supposed to have her Prolastin yesterday but wanted to make sure it was okay  Lives at independent living . Unclear how she contracted, first resident to have.     Patient Active Problem List   Diagnosis Date Noted  . Needs flu shot 09/13/2017  . Upper airway cough syndrome 11/15/2016  .  Encounter for therapeutic drug monitoring 07/07/2016  . IPF (idiopathic pulmonary fibrosis) ([place]) 04/14/2016  . COPD exacerbation ([place]) 09/09/2015  . Alpha-1-antitrypsin deficiency ([place]) 09/09/2015  . Pulmonary emphysema with fibrosis of lung ([place]) 09/09/2015  . ILD (interstitial lung disease) ([place]) 09/09/2015  . Smoking history 08/11/2015  . Family history of alpha 1 antitrypsin deficiency 08/11/2015  . Dyspnea and respiratory abnormality 08/11/2015  . HNP (herniated nucleus pulposus) 03/16/2015  . Obesity 02/05/2014  . Internal hemorrhoid 08/23/2013  . RUQ abdominal pain 08/23/2013  . Nonspecific abnormal finding in stool contents 08/23/2013  . Abdominal pain, epigastric 06/03/2013  . Acute diverticulitis 05/29/2013  . Abdominal pain, left lower quadrant 05/29/2013  . Nausea and vomiting 05/29/2013  . Leukocytosis 05/29/2013  . Acute renal failure ([place]) 05/29/2013  . Anemia 05/29/2013  . Diabetes ([place]) 05/29/2013  . Dyslipidemia 05/29/2013  . HTN (hypertension) 04/14/2013   Current Outpatient Medications on File Prior to Visit  Medication Sig Dispense Refill  . acetaminophen (TYLENOL) 325 MG tablet Take 650 mg by mouth every 6 (six) hours as needed (joint pain/swelling).    Marland Kitchen albuterol (PROVENTIL) (2.5 MG/3ML) 0.083% nebulizer solution Take 3 mLs (2.5 mg total) by nebulization every 6 (six) hours as needed for wheezing or shortness of breath. 900 mL 1  . atorvastatin (LIPITOR) 40 MG tablet Take 1 tablet by mouth daily.  11  . brimonidine (ALPHAGAN) 0.15 % ophthalmic solution Place 1 drop into both eyes 3 (three) times daily.  3  . buPROPion (  WELLBUTRIN XL) 300 MG 24 hr tablet Take 1 tablet by mouth every morning.    . cyclobenzaprine (FLEXERIL) 10 MG tablet Take 10 mg by mouth at bedtime as needed for muscle spasms.   0  . glipiZIDE (GLUCOTROL XL) 5 MG 24 hr tablet TK 1 T PO QD  11  . hydrochlorothiazide (MICROZIDE) 12.5 MG capsule Take 1 capsule (12.5 mg total) by mouth daily. 90  capsule 1  . latanoprost (XALATAN) 0.005 % ophthalmic solution 1 drop at bedtime. Left eye    . losartan (COZAAR) 50 MG tablet Take 50 mg by mouth daily.    . meclizine (ANTIVERT) 12.5 MG tablet Take 1 tablet (12.5 mg total) by mouth 3 (three) times daily as needed for dizziness. 16 tablet 0  . Menthol, Topical Analgesic, (BIOFREEZE) 4 % GEL Apply 1 application topically 3 (three) times daily as needed (for back pain).    . metFORMIN (GLUCOPHAGE-XR) 500 MG 24 hr tablet TK 2 TS PO BID  11  . metoprolol succinate (TOPROL-XL) 25 MG 24 hr tablet TAKE 1 TABLET(25 MG) BY MOUTH DAILY 90 tablet 1  . Multiple Vitamin (MULTIVITAMIN) capsule Take 1 capsule by mouth daily.    Marland Kitchen omeprazole (PRILOSEC) 40 MG capsule TAKE 1 CAPSULE(40 MG) BY MOUTH TWICE DAILY BEFORE LUNCH AND SUPPER 90 capsule 3  . ondansetron (ZOFRAN ODT) 4 MG disintegrating tablet Take 1 tablet (4 mg total) by mouth every 8 (eight) hours as needed for nausea or vomiting. 10 tablet 0  . PROLASTIN-C 1000 MG SOLR injection Inject 1,000 mg into the skin once a week.    . promethazine (PHENERGAN) 25 MG tablet Take 1 tablet (25 mg total) by mouth every 6 (six) hours as needed for nausea or vomiting. 12 tablet 0  . sertraline (ZOLOFT) 50 MG tablet Take 50 mg by mouth daily.    Marland Kitchen SPIRIVA HANDIHALER 18 MCG inhalation capsule PLACE 1 CAPSULE INTO INHALER AND INHALE DAILY 30 capsule 5  . traMADol (ULTRAM) 50 MG tablet Take 1-2 tablets (50-100 mg total) by mouth every 6 (six) hours as needed (mild pain). 30 tablet 0   No current facility-administered medications on file prior to visit.    Observations/Objective: High-resolution CT chest November 2020 showed stable ILD changes, probable UIP since 2018, emphysema  12/31/2019  Speaks in full sentences with no audible distress or wheezing.  O2 saturations per patient are 94 to 95% on room air which is her baseline  Assessment and Plan: Acute COVID-19 infection-patient is in high risk population for  potential complications of [id number] infection.  Discussed in detail possible treatment option of monoclonal antibody infusion, Bamlanivimab.  Patient is interested and would like to proceed with MAB infusion .  Discussed red flag symptoms to seek emergency care with .  COVID vaccine #2 - will need to be 90 days after MAB infusion   Emphysema-appears stable- no active flare .  May proceed with Prolastin on 2/19 .   ILD-appears stable. O2 sats are stable.   Plan Patient Instructions  I am setting you up for a monoclonal antibody infusion which has been scheduled for January 01, 2020 at 10:30 PM at our [facility], address is [place], [place] [address] daily You may proceed with your Prolastin infusion on February 19 if you are doing well Please check oxygen levels once or twice a day and as needed please call if O2 saturations are dropping or get below 90% Activity as tolerated  Fluids and rest Follow-up in 1 week and as needed for televisit/video visit  Please contact office for sooner follow up if symptoms do not improve or worsen or seek emergency care            Follow Up Instructions:   Follow-up in 1 week and as needed  I discussed the assessment and treatment plan with the patient. The patient was provided an opportunity to ask questions and all were answered. The patient agreed with the plan and demonstrated an understanding of the instructions.   The patient was advised to call back or seek an in-person evaluation if the symptoms worsen or if the condition fails to improve as anticipated.  I provided 35  minutes of non-face-to-face time during this encounter.   Rexene Edison, NP

## 2020-01-01 ENCOUNTER — Ambulatory Visit (HOSPITAL_COMMUNITY)
Admission: RE | Admit: 2020-01-01 | Discharge: 2020-01-01 | Disposition: A | Discharge status: Home / Self Care | Payer: Medicare Other | Source: Ambulatory Visit | Attending: Pulmonary Disease | Admitting: Pulmonary Disease

## 2020-01-01 DIAGNOSIS — U071 COVID-19: Secondary | ICD-10-CM | POA: Diagnosis not present

## 2020-01-01 DIAGNOSIS — Z23 Encounter for immunization: Secondary | ICD-10-CM | POA: Diagnosis not present

## 2020-01-01 MED ORDER — DIPHENHYDRAMINE HCL 50 MG/ML IJ SOLN: 50.0000 mg | Freq: Once | INTRAMUSCULAR | Status: DC | PRN

## 2020-01-01 MED ORDER — EPINEPHRINE 0.3 MG/0.3ML IJ SOAJ: 0.3000 mg | Freq: Once | INTRAMUSCULAR | Status: DC | PRN

## 2020-01-01 MED ORDER — SODIUM CHLORIDE 0.9 % IV SOLN
INTRAVENOUS | Status: DC | PRN
  Administered 2020-01-01: 11:00:00 250 mL via INTRAVENOUS

## 2020-01-01 MED ORDER — ALBUTEROL SULFATE HFA 108 (90 BASE) MCG/ACT IN AERS: 2.0000 | INHALATION_SPRAY | Freq: Once | RESPIRATORY_TRACT | Status: DC | PRN

## 2020-01-01 MED ORDER — SODIUM CHLORIDE 0.9 % IV SOLN
700.0000 mg | Freq: Once | INTRAVENOUS | Status: AC
  Administered 2020-01-01: 700 mg via INTRAVENOUS
  Filled 2020-01-01: qty 20

## 2020-01-01 MED ORDER — FAMOTIDINE IN NACL 20-0.9 MG/50ML-% IV SOLN: 20.0000 mg | Freq: Once | INTRAVENOUS | Status: DC | PRN

## 2020-01-01 MED ORDER — METHYLPREDNISOLONE SODIUM SUCC 125 MG IJ SOLR: 125.0000 mg | Freq: Once | INTRAMUSCULAR | Status: DC | PRN

## 2020-01-01 NOTE — Progress Notes (Signed)
  Diagnosis: COVID-19  Physician: Dr. Joya Gaskins  Procedure: [facility] Med: bamlanivimab infusion - Provided patient with bamlanimivab fact sheet for patients, parents and caregivers prior to infusion.  Complications: No immediate complications noted.  Discharge: Discharged home   Babs Sciara 01/01/2020

## 2020-01-04 LAB — CULTURE, BLOOD (ROUTINE X 2)
Culture: NO GROWTH
Culture: NO GROWTH

## 2020-01-08 ENCOUNTER — Ambulatory Visit: Payer: PPO | Admitting: Adult Health

## 2020-01-31 ENCOUNTER — Encounter (HOSPITAL_COMMUNITY): Payer: Self-pay | Admitting: Cardiology

## 2020-02-14 ENCOUNTER — Telehealth (HOSPITAL_COMMUNITY): Payer: Self-pay | Admitting: Cardiology

## 2020-02-14 NOTE — Telephone Encounter (Signed)
Just an FYI. We have made several attempts to contact this patient including sending a letter to schedule or reschedule their Myoview. We will be removing the patient from the echo WQ.  Mailed a letter [facility] 01/31/20  01/31/2020 called and unable to LVM box full @ 11:33am/[facility]  01/29/20 called and unable to LVM box full/[facility] @ 10:24  01/27/20 [facility] to schedule Lexiscan @ 9:53/[facility]  2/15, has Covid, will CB to r/s       Thank you

## 2020-02-18 NOTE — Telephone Encounter (Signed)
Agree.  Tobiah Celestine, MD  

## 2020-03-17 DIAGNOSIS — R110 Nausea: Secondary | ICD-10-CM | POA: Diagnosis not present

## 2020-03-17 DIAGNOSIS — J0100 Acute maxillary sinusitis, unspecified: Secondary | ICD-10-CM | POA: Diagnosis not present

## 2020-03-17 DIAGNOSIS — K5792 Diverticulitis of intestine, part unspecified, without perforation or abscess without bleeding: Secondary | ICD-10-CM | POA: Diagnosis not present

## 2020-05-02 ENCOUNTER — Other Ambulatory Visit: Payer: Self-pay | Admitting: Gastroenterology

## 2020-05-28 DIAGNOSIS — N898 Other specified noninflammatory disorders of vagina: Secondary | ICD-10-CM | POA: Diagnosis not present

## 2020-05-28 DIAGNOSIS — R300 Dysuria: Secondary | ICD-10-CM | POA: Diagnosis not present

## 2020-07-09 DIAGNOSIS — A6920 Lyme disease, unspecified: Secondary | ICD-10-CM | POA: Diagnosis not present

## 2020-07-13 ENCOUNTER — Telehealth: Payer: Self-pay | Admitting: Unknown Physician Specialty

## 2020-07-13 NOTE — Telephone Encounter (Signed)
Copied from [place] [id number]. Topic: General - Other >> Jul 13, 2020 12:32 PM Celene Kras wrote: Reason for CRM: Pt called stating that she is giving a call back to [employer] from February. She is requesting a call back to follow up. Please advise.

## 2020-07-14 DIAGNOSIS — E785 Hyperlipidemia, unspecified: Secondary | ICD-10-CM | POA: Diagnosis not present

## 2020-07-14 DIAGNOSIS — I10 Essential (primary) hypertension: Secondary | ICD-10-CM | POA: Diagnosis not present

## 2020-07-14 DIAGNOSIS — J449 Chronic obstructive pulmonary disease, unspecified: Secondary | ICD-10-CM | POA: Diagnosis not present

## 2020-07-14 DIAGNOSIS — E1122 Type 2 diabetes mellitus with diabetic chronic kidney disease: Secondary | ICD-10-CM | POA: Diagnosis not present

## 2020-07-14 DIAGNOSIS — E119 Type 2 diabetes mellitus without complications: Secondary | ICD-10-CM | POA: Diagnosis not present

## 2020-07-14 DIAGNOSIS — H353131 Nonexudative age-related macular degeneration, bilateral, early dry stage: Secondary | ICD-10-CM | POA: Diagnosis not present

## 2020-07-14 DIAGNOSIS — N183 Chronic kidney disease, stage 3 unspecified: Secondary | ICD-10-CM | POA: Diagnosis not present

## 2020-07-14 DIAGNOSIS — J84112 Idiopathic pulmonary fibrosis: Secondary | ICD-10-CM | POA: Diagnosis not present

## 2020-07-14 DIAGNOSIS — Z0000 Encounter for general adult medical examination without abnormal findings: Secondary | ICD-10-CM | POA: Diagnosis not present

## 2020-07-14 DIAGNOSIS — G4733 Obstructive sleep apnea (adult) (pediatric): Secondary | ICD-10-CM | POA: Diagnosis not present

## 2020-12-23 DIAGNOSIS — G4733 Obstructive sleep apnea (adult) (pediatric): Secondary | ICD-10-CM | POA: Diagnosis not present

## 2020-12-31 DIAGNOSIS — M25561 Pain in right knee: Secondary | ICD-10-CM | POA: Diagnosis not present

## 2021-01-12 DIAGNOSIS — M25561 Pain in right knee: Secondary | ICD-10-CM | POA: Diagnosis not present

## 2021-01-19 ENCOUNTER — Telehealth: Payer: Self-pay | Admitting: Internal Medicine

## 2021-01-19 NOTE — Telephone Encounter (Signed)
Pt states that she has been coughing up yellow phlegm and states that within the past 5 days she has been seeing streams of blood when she is coughing up the phlegm also states that she has been losing unintentional weight for the past 5 months and has been very lethargic and fatigued lately. Pls regard [phone]

## 2021-01-19 NOTE — Telephone Encounter (Signed)
I called and spoke with pt and she has been scheduled to see TP on 03/09 at 930.

## 2021-01-20 ENCOUNTER — Other Ambulatory Visit: Payer: Self-pay

## 2021-01-20 ENCOUNTER — Ambulatory Visit (INDEPENDENT_AMBULATORY_CARE_PROVIDER_SITE_OTHER): Payer: PPO

## 2021-01-20 ENCOUNTER — Encounter: Payer: Self-pay | Admitting: Adult Health

## 2021-01-20 ENCOUNTER — Ambulatory Visit: Payer: PPO | Admitting: Adult Health

## 2021-01-20 VITALS — BP 142/80 | HR 61 | Temp 97.4°F | Ht 67.5 in | Wt 198.8 lb

## 2021-01-20 DIAGNOSIS — J84112 Idiopathic pulmonary fibrosis: Secondary | ICD-10-CM

## 2021-01-20 DIAGNOSIS — J441 Chronic obstructive pulmonary disease with (acute) exacerbation: Secondary | ICD-10-CM

## 2021-01-20 DIAGNOSIS — J189 Pneumonia, unspecified organism: Secondary | ICD-10-CM | POA: Insufficient documentation

## 2021-01-20 DIAGNOSIS — E8801 Alpha-1-antitrypsin deficiency: Secondary | ICD-10-CM | POA: Diagnosis not present

## 2021-01-20 DIAGNOSIS — R042 Hemoptysis: Secondary | ICD-10-CM | POA: Diagnosis not present

## 2021-01-20 DIAGNOSIS — M25561 Pain in right knee: Secondary | ICD-10-CM | POA: Diagnosis not present

## 2021-01-20 MED ORDER — STIOLTO RESPIMAT 2.5-2.5 MCG/ACT IN AERS: 2.0000 | INHALATION_SPRAY | Freq: Every day | RESPIRATORY_TRACT | 0 refills | Status: DC

## 2021-01-20 MED ORDER — AMOXICILLIN-POT CLAVULANATE 875-125 MG PO TABS: 1.0000 | ORAL_TABLET | Freq: Two times a day (BID) | ORAL | 0 refills | Status: AC

## 2021-01-20 MED ORDER — PREDNISONE 20 MG PO TABS: 20.0000 mg | ORAL_TABLET | Freq: Every day | ORAL | 0 refills | Status: DC

## 2021-01-20 MED ORDER — STIOLTO RESPIMAT 2.5-2.5 MCG/ACT IN AERS: 2.0000 | INHALATION_SPRAY | Freq: Every day | RESPIRATORY_TRACT | 5 refills | Status: DC

## 2021-01-20 NOTE — Patient Instructions (Addendum)
Augmentin 875mg  Twice daily  For 7 days -take with food  Mucinex DM Twice daily  As needed  Cough/congestion  Covid 19 testing.  Prednisone 20mg  daily for 5 days  Begin Stiolto 2 puffs daily .  Albuterol neb As needed   Follow up in 1 week and As needed   Please contact office for sooner follow up if symptoms do not improve or worsen or seek emergency care

## 2021-01-20 NOTE — Progress Notes (Signed)
@Patient  ID: Angela [name], female    DOB: 07/30/1946, 75 y.o.   MRN: [id number]  Chief Complaint  Patient presents with  . Follow-up    Referring provider: Maurice Small, MD  HPI: 75 year old female former smoker followed for COPD, emphysema (MZ phenotype-low levels) and idiopathic pulmonary fibrosis  TEST/EVENTS :  High-resolution CT chest November 2020 showed stable ILD changes, probable UIP since 2018, emphysema  Pulmonary function test February 14, 2018 FEV1 72%, ratio 78, FVC 70%, and 41%  Trial of Esbriet in the past unable to tolerate due to GI side effects   01/20/2021 Acute OV : COPD  Patient presents for an acute office visit.  Last seen via video visit February 2021.  She complains over the last 5 to 7 days that she has had increased cough congestion with thick green mucus that is mixed with streaks of blood.  She has had associated weight loss of about 13 pounds.  She denies any frank hemoptysis, fever, loss of taste or smell, edema.  She says her appetite is fair.  No nausea vomiting or diarrhea. Patient has known emphysema with alpha-1 MZ phenotype.  She has been on replacement therapy with Prolastin C.  She says she has not been on this for the last 2 months due to insurance issues.  She is currently working to get coverage. Previously had been on Spiriva daily.  But says she stopped this because she just did not feel like it was working wants to see if there is any alternatives.  Chest x-ray today shows progressive ILD changes and increased opacity in the lingula suspicious for superimposed pneumonia.  Patient is fully vaccinated for COVID-19.  She did have COVID-03 January 2020 and received a monoclonal antibody infusion. Patient says she has a good appetite with no nausea vomiting or diarrhea. She is not on oxygen at home.  O2 saturations are 96% on room air. She has not tested for COVID-19 during this illness.  As above patient has IPF.  Patient has previously been tried  on Esbriet was unable to tolerate due to GI side effects.  She has declined retrial of antifibrotic's.  Allergies  Allergen Reactions  . Tizanidine Other (See Comments)    Severe decrease in BP   . Betadine [Povidone Iodine] Rash  . Ciprofloxacin Rash  . Codeine Nausea And Vomiting  . Iohexol      Desc: Pt states several years ago during a CT scan w/ iv cotnrast she had severe nausea and vomiting, sob w/ difficulty breathing and swallowing.  She had full 13hr. premeds today (5/14/7) and did fine w/o complications.     Immunization History  Administered Date(s) Administered  . Influenza, High Dose Seasonal PF 09/13/2017, 08/14/2020  . Moderna Sars-Covid-2 Vaccination 12/16/2019, 01/13/2020, 09/22/2020  . Pneumococcal Conjugate-13 10/15/2014  . Pneumococcal-Unspecified 11/14/2012  . Tdap 09/05/2016    Past Medical History:  Diagnosis Date  . Allergy   . Alpha 1-antitrypsin PiMS phenotype   . Anemia 2014   mild  . Anxiety   . Arthritis   . Asthma   . Blood transfusion without reported diagnosis   . Cataract   . Chronic kidney disease    early stages of some insufficiency  . Complication of anesthesia    passes out 24 hrs after getting  . COPD (chronic obstructive pulmonary disease) ([place])   . DDD (degenerative disc disease)   . Depression    with anxiety  . Diabetes mellitus   . Diverticulitis   .  Diverticulosis   . Esophageal dysmotility 03/01/06   mild  . Fibromyalgia   . GERD (gastroesophageal reflux disease)   . Glaucoma   . Heart murmur    "years ago"  . Hypercholesterolemia   . Hypertension   . IPF (idiopathic pulmonary fibrosis) ([place])   . Shingles   . Shortness of breath dyspnea   . Sleep apnea    wears CPAP  . Stroke [facility]) 2010   3 tia's    Tobacco History: Social History   Tobacco Use  Smoking Status Former Smoker  . Packs/day: 1.00  . Years: 22.00  . Pack years: 22.00  . Types: Cigarettes  . Start date: 86  . Quit date: 04/14/2012  .  Years since quitting: 8.7  Smokeless Tobacco Never Used   Counseling given: Not Answered   Outpatient Medications Prior to Visit  Medication Sig Dispense Refill  . acetaminophen (TYLENOL) 325 MG tablet Take 650 mg by mouth every 6 (six) hours as needed (joint pain/swelling).    Marland Kitchen albuterol (PROVENTIL) (2.5 MG/3ML) 0.083% nebulizer solution Take 3 mLs (2.5 mg total) by nebulization every 6 (six) hours as needed for wheezing or shortness of breath. 900 mL 1  . atorvastatin (LIPITOR) 40 MG tablet Take 1 tablet by mouth daily.  11  . brimonidine (ALPHAGAN) 0.15 % ophthalmic solution Place 1 drop into both eyes 3 (three) times daily.  3  . buPROPion (WELLBUTRIN XL) 300 MG 24 hr tablet Take 1 tablet by mouth every morning.    . cyclobenzaprine (FLEXERIL) 10 MG tablet Take 10 mg by mouth at bedtime as needed for muscle spasms.   0  . glipiZIDE (GLUCOTROL XL) 5 MG 24 hr tablet TK 1 T PO QD  11  . hydrochlorothiazide (MICROZIDE) 12.5 MG capsule Take 1 capsule (12.5 mg total) by mouth daily. 90 capsule 1  . HYDROcodone-acetaminophen (NORCO/VICODIN) 5-325 MG tablet Take 1 tablet by mouth 2 (two) times daily as needed.    . latanoprost (XALATAN) 0.005 % ophthalmic solution 1 drop at bedtime. Left eye    . losartan (COZAAR) 50 MG tablet Take 50 mg by mouth daily.    . meclizine (ANTIVERT) 12.5 MG tablet Take 1 tablet (12.5 mg total) by mouth 3 (three) times daily as needed for dizziness. 16 tablet 0  . Menthol, Topical Analgesic, 4 % GEL Apply 1 application topically 3 (three) times daily as needed (for back pain).    . metFORMIN (GLUCOPHAGE-XR) 500 MG 24 hr tablet TK 2 TS PO BID  11  . metoprolol succinate (TOPROL-XL) 25 MG 24 hr tablet TAKE 1 TABLET(25 MG) BY MOUTH DAILY 90 tablet 1  . omeprazole (PRILOSEC) 40 MG capsule TAKE 1 CAPSULE(40 MG) BY MOUTH TWICE DAILY BEFORE LUNCH AND SUPPER 90 capsule 3  . ondansetron (ZOFRAN ODT) 4 MG disintegrating tablet Take 1 tablet (4 mg total) by mouth every 8  (eight) hours as needed for nausea or vomiting. 10 tablet 0  . promethazine (PHENERGAN) 25 MG tablet Take 1 tablet (25 mg total) by mouth every 6 (six) hours as needed for nausea or vomiting. 12 tablet 0  . sertraline (ZOLOFT) 50 MG tablet Take 50 mg by mouth daily.    . traMADol (ULTRAM) 50 MG tablet Take 1-2 tablets (50-100 mg total) by mouth every 6 (six) hours as needed (mild pain). 30 tablet 0  . Multiple Vitamin (MULTIVITAMIN) capsule Take 1 capsule by mouth daily. (Patient not taking: Reported on 01/20/2021)    . PROLASTIN-C 1000  MG SOLR injection Inject 1,000 mg into the skin once a week. (Patient not taking: Reported on 01/20/2021)    . SPIRIVA HANDIHALER 18 MCG inhalation capsule PLACE 1 CAPSULE INTO INHALER AND INHALE DAILY 30 capsule 5   No facility-administered medications prior to visit.     Review of Systems:   Constitutional:   No  weight loss, night sweats,  Fevers, chills,  +fatigue, or  lassitude.  HEENT:   No headaches,  Difficulty swallowing,  Tooth/dental problems, or  Sore throat,                No sneezing, itching, ear ache, nasal congestion, post nasal drip,   CV:  No chest pain,  Orthopnea, PND, swelling in lower extremities, anasarca, dizziness, palpitations, syncope.   GI  No heartburn, indigestion, abdominal pain, nausea, vomiting, diarrhea, change in bowel habits, loss of appetite, bloody stools.   Resp:   No chest wall deformity  Skin: no rash or lesions.  GU: no dysuria, change in color of urine, no urgency or frequency.  No flank pain, no hematuria   MS:  No joint pain or swelling.  No decreased range of motion.  No back pain.    Physical Exam  BP (!) 142/80 (BP Location: Left Arm, Cuff Size: Large)   Pulse 61   Temp (!) 97.4 F (36.3 C) (Temporal)   Ht 5' 7.5" (1.715 m)   Wt 198 lb 12.8 oz (90.2 kg)   LMP  (LMP Unknown)   SpO2 96% Comment: RA  BMI 30.68 kg/m   GEN: A/Ox3; [place] , NAD, elderly   HEENT:  [place]/AT,   NOSE-clear,  THROAT-clear, no lesions, no postnasal drip or exudate noted.   NECK:  Supple w/ fair ROM; no JVD; normal carotid impulses w/o bruits; no thyromegaly or nodules palpated; no lymphadenopathy.    RESP bibasilar crackles no accessory muscle use, no dullness to percussion Speaks in full sentences with no apparent distress  CARD:  RRR, no m/r/g, none to trace peripheral edema, pulses intact, no cyanosis or clubbing.  GI:   Soft & nt; nml bowel sounds; no organomegaly or masses detected.   [name]: Warm bil, no deformities or joint swelling noted.   Neuro: alert, no focal deficits noted.    Skin: Warm, no lesions or rashes    Lab Results:  CBC  BNP No results found for: BNP  ProBNP No results found for: PROBNP  Imaging: DG Chest 2 View  Result Date: 01/20/2021 CLINICAL DATA:  75 year old female with hemoptysis. Chronic interstitial lung disease, UIP. EXAM: CHEST - 2 VIEW COMPARISON:  Portable chest 12/29/2019 and earlier. FINDINGS: Lung volumes appear mildly decreased since 2020. Mediastinal contours remain normal. Visualized tracheal air column is within normal limits. Coarse peripheral reticulonodular opacity in both lungs is chronic but appears increased since 2020. And there is questionable more confluent new left lower lung opacity on the PA view, not correlated on the lateral. Perhaps this is in the lingula. No pneumothorax. No pleural effusion. No acute osseous abnormality identified. Prior anterior and posterior lower cervical, cervicothoracic junction fusion. Negative visible bowel gas pattern. IMPRESSION: Chronic interstitial lung disease appears radiographically progressed since 2020, with questionable confluent new left lingula opacity which might relate to hemoptysis in this setting. Electronically Signed   By: Genevie Ann M.D.   On: 01/20/2021 10:10      PFT Results Latest Ref Rng & Units 02/14/2018 09/13/2017 06/08/2017 08/16/2016 03/04/2016 08/28/2015  FVC-Pre L 2.44 2.45 2.63  2.69 2.79 2.81  FVC-Predicted Pre % 70 70 75 76 79 79  FVC-Post L - - - - 2.84 2.75  FVC-Predicted Post % - - - - 81 77  Pre FEV1/FVC % % 78 77 75 79 75 75  Post FEV1/FCV % % - - - - 77 79  FEV1-Pre L 1.89 1.90 1.97 2.11 2.10 2.10  FEV1-Predicted Pre % 72 72 74 79 78 77  FEV1-Post L - - - - 2.20 2.16  DLCO uncorrected ml/min/mmHg 12.16 12.31 13.31 14.10 14.76 15.56  DLCO UNC% % 41 42 45 48 50 53  DLCO corrected ml/min/mmHg - 12.35 13.43 13.90 15.20 -  DLCO COR %Predicted % - 42 46 47 52 -  DLVA Predicted % 64 68 63 61 66 70  TLC L - - - - 4.85 4.11  TLC % Predicted % - - - - 86 73  RV % Predicted % - - - - 84 59    No results found for: NITRICOXIDE      Assessment & Plan:   COPD exacerbation (HCC) Acute exacerbation with probable lingular pneumonia Needs COVID-19 testing.  Patient is advised to quarantine until results are available and to call if her test results are positive.  If COVID-19 test is positive would refer to the monoclonal antibody infusion team Will treat with empiric antibiotics and short course of steroids. Patient is been off of her alpha-1 replacement and off of her LAMA inhaler.  Patient is continue to follow-up with her insurance and drug company regarding grant programs. Will begin Stiolto trial. Close follow-up.  Patient is advised of red flag symptoms to be on the outlook for such as worsening shortness of breath, worsening hemoptysis, fever, nausea vomiting diarrhea, etc.  She is to seek immediate medical care if develops  Plan  Patient Instructions  Augmentin 875mg  Twice daily  For 7 days -take with food  Mucinex DM Twice daily  As needed  Cough/congestion  Covid 19 testing.  Prednisone 20mg  daily for 5 days  Begin Stiolto 2 puffs daily .  Albuterol neb As needed   Follow up in 1 week and As needed   Please contact office for sooner follow up if symptoms do not improve or worsen or seek emergency care        CAP (community acquired  pneumonia) Chest x-ray shows a lingular opacity suspicious for pneumonia. We will treat with empiric antibiotics.  Patient is unable to take quinolones.  Will use Augmentin x7 days.  Close follow-up.  Will need serial chest x-ray for clearance Discussed COVID-19 testing  Plan  Patient Instructions  Augmentin 875mg  Twice daily  For 7 days -take with food  Mucinex DM Twice daily  As needed  Cough/congestion  Covid 19 testing.  Prednisone 20mg  daily for 5 days  Begin Stiolto 2 puffs daily .  Albuterol neb As needed   Follow up in 1 week and As needed   Please contact office for sooner follow up if symptoms do not improve or worsen or seek emergency care        IPF (idiopathic pulmonary fibrosis) ([place]) ILD with UIP pattern on CT scan.  This is complicated by underlying COPD with emphysema and alpha-1 MZ phenotype with low levels requiring replacement. Patient is high risk for decompensation.  O2 saturations are adequate now on room air.  She has not oxygen dependent. She has been intolerant to antifibrotic in the past.  And does not wish to retry these. Once she  is over the acute illness consider a follow-up high-resolution CT chest as chest x-ray today shows progressive changes.  Also to note she did have COVID-19 1 year ago.  But per her report recovered fully.  Plan  Patient Instructions  Augmentin 875mg  Twice daily  For 7 days -take with food  Mucinex DM Twice daily  As needed  Cough/congestion  Covid 19 testing.  Prednisone 20mg  daily for 5 days  Begin Stiolto 2 puffs daily .  Albuterol neb As needed   Follow up in 1 week and As needed   Please contact office for sooner follow up if symptoms do not improve or worsen or seek emergency care        Alpha-1-antitrypsin deficiency ([place]) Alpha-1 MZ phenotype.  With low levels.  Requires replacement therapy.  Has been doing well on Prolastin C infusions.  Patient is continue to work with the insurance and grant program to see if  we can get this to be affordable for her.  We will reevaluate on return visit     Rexene Edison, NP 01/20/2021

## 2021-01-20 NOTE — Assessment & Plan Note (Signed)
Acute exacerbation with probable lingular pneumonia Needs COVID-19 testing.  Patient is advised to quarantine until results are available and to call if her test results are positive.  If COVID-19 test is positive would refer to the monoclonal antibody infusion team Will treat with empiric antibiotics and short course of steroids. Patient is been off of her alpha-1 replacement and off of her LAMA inhaler.  Patient is continue to follow-up with her insurance and drug company regarding grant programs. Will begin Stiolto trial. Close follow-up.  Patient is advised of red flag symptoms to be on the outlook for such as worsening shortness of breath, worsening hemoptysis, fever, nausea vomiting diarrhea, etc.  She is to seek immediate medical care if develops  Plan  Patient Instructions  Augmentin 875mg  Twice daily  For 7 days -take with food  Mucinex DM Twice daily  As needed  Cough/congestion  Covid 19 testing.  Prednisone 20mg  daily for 5 days  Begin Stiolto 2 puffs daily .  Albuterol neb As needed   Follow up in 1 week and As needed   Please contact office for sooner follow up if symptoms do not improve or worsen or seek emergency care

## 2021-01-20 NOTE — Assessment & Plan Note (Signed)
Alpha-1 MZ phenotype.  With low levels.  Requires replacement therapy.  Has been doing well on Prolastin C infusions.  Patient is continue to work with the insurance and grant program to see if we can get this to be affordable for her.  We will reevaluate on return visit

## 2021-01-20 NOTE — Assessment & Plan Note (Addendum)
Chest x-ray shows a lingular opacity suspicious for pneumonia. We will treat with empiric antibiotics.  Patient is unable to take quinolones.  Will use Augmentin x7 days.  Close follow-up.  Will need serial chest x-ray for clearance Discussed COVID-19 testing  Plan  Patient Instructions  Augmentin 875mg  Twice daily  For 7 days -take with food  Mucinex DM Twice daily  As needed  Cough/congestion  Covid 19 testing.  Prednisone 20mg  daily for 5 days  Begin Stiolto 2 puffs daily .  Albuterol neb As needed   Follow up in 1 week and As needed   Please contact office for sooner follow up if symptoms do not improve or worsen or seek emergency care

## 2021-01-20 NOTE — Assessment & Plan Note (Signed)
ILD with UIP pattern on CT scan.  This is complicated by underlying COPD with emphysema and alpha-1 MZ phenotype with low levels requiring replacement. Patient is high risk for decompensation.  O2 saturations are adequate now on room air.  She has not oxygen dependent. She has been intolerant to antifibrotic in the past.  And does not wish to retry these. Once she is over the acute illness consider a follow-up high-resolution CT chest as chest x-ray today shows progressive changes.  Also to note she did have COVID-19 1 year ago.  But per her report recovered fully.  Plan  Patient Instructions  Augmentin 875mg  Twice daily  For 7 days -take with food  Mucinex DM Twice daily  As needed  Cough/congestion  Covid 19 testing.  Prednisone 20mg  daily for 5 days  Begin Stiolto 2 puffs daily .  Albuterol neb As needed   Follow up in 1 week and As needed   Please contact office for sooner follow up if symptoms do not improve or worsen or seek emergency care

## 2021-01-29 ENCOUNTER — Ambulatory Visit: Payer: PPO | Admitting: Adult Health

## 2021-01-29 ENCOUNTER — Encounter: Payer: Self-pay | Admitting: Adult Health

## 2021-01-29 ENCOUNTER — Other Ambulatory Visit: Payer: Self-pay

## 2021-01-29 DIAGNOSIS — E8801 Alpha-1-antitrypsin deficiency: Secondary | ICD-10-CM

## 2021-01-29 DIAGNOSIS — J849 Interstitial pulmonary disease, unspecified: Secondary | ICD-10-CM

## 2021-01-29 DIAGNOSIS — J441 Chronic obstructive pulmonary disease with (acute) exacerbation: Secondary | ICD-10-CM | POA: Diagnosis not present

## 2021-01-29 MED ORDER — AMOXICILLIN-POT CLAVULANATE 875-125 MG PO TABS: 1.0000 | ORAL_TABLET | Freq: Two times a day (BID) | ORAL | 0 refills | Status: AC

## 2021-01-29 MED ORDER — ALBUTEROL SULFATE (2.5 MG/3ML) 0.083% IN NEBU
2.5000 mg | INHALATION_SOLUTION | Freq: Four times a day (QID) | RESPIRATORY_TRACT | 5 refills | Status: DC | PRN
Start: 2021-01-29 — End: 2023-05-01

## 2021-01-29 NOTE — Patient Instructions (Addendum)
Extend Augmentin 875mg  Twice daily  For 3  days -take with food  Mucinex DM Twice daily  As needed  Cough/congestion  Continue on Stiolto 2 puffs daily .  Begin Stiolto patient assistance paperwork .  Albuterol neb As needed   Follow up in 6 weeks with Dr. Chase Caller or [place] NP in 6 weeks with Chest xray .  Please contact office for sooner follow up if symptoms do not improve or worsen or seek emergency care

## 2021-01-29 NOTE — Assessment & Plan Note (Signed)
IPF-currently stable.  Patient has been intolerant to aspirin in the past.  Declines additional antifibrotic's.  Continue to monitor

## 2021-01-29 NOTE — Progress Notes (Signed)
@Patient  ID: Angela [name], female    DOB: 11/09/1946, 75 y.o.   MRN: [id number]  Chief Complaint  Patient presents with   Follow-up    Referring provider: Maurice Small, MD  HPI: 75 year old female former smoker followed for COPD, emphysema (MZ phenotype with low levels) and idiopathic pulmonary fibrosis Covid 19 12/2019   TEST/EVENTS :  High-resolution CT chest November 2020 showed stable ILD changes, probable UIP since 2018, emphysema  Pulmonary function test February 14, 2018 FEV1 72%, ratio 78, FVC 70%, and 41%  Trial of Esbriet in the past unable to tolerate due to GI side effects  01/29/2021 Follow up ; COPD Patient returns for a 1 week follow-up.  Patient was last seen for a COPD exacerbation.  She had had increased cough congestion for a week with some blood mixed into her mucus.Chest x-ray last visit showed progressive ILD changes with increased opacity along the lingula suspicious for pneumonia.  She was started on Augmentin for 7 days.  Given prednisone 20 mg daily for 5 days.  And started on Stiolto.  Since last visit patient is feeling better with decreased cough and shortness of breath.  However patient misunderstood the instructions and only took Augmentin daily instead of twice daily.  She has finished her prednisone.  Appetite remains good with no nausea vomiting or diarrhea Patient was started on Stiolto last time she feels that this has really helped however her insurance would not cover.  We discussed filling out papers for patient assistance  Patient has known emphysema with alpha-1 MZ phenotype.  She has been on replacement therapy with Prolastin C.  However this has been held hold for over 2 months due to insurance issues.  Patient has known IPF.  Previously tried on Esbriet but unable to tolerate due to GI side effects.  She has declined antifibrotics going forward.    Allergies  Allergen Reactions   Tizanidine Other (See Comments)    Severe decrease in BP     Betadine [Povidone Iodine] Rash   Ciprofloxacin Rash   Codeine Nausea And Vomiting   Iohexol      Desc: Pt states several years ago during a CT scan w/ iv cotnrast she had severe nausea and vomiting, sob w/ difficulty breathing and swallowing.  She had full 13hr. premeds today (5/14/7) and did fine w/o complications.     Immunization History  Administered Date(s) Administered   Influenza, High Dose Seasonal PF 09/13/2017, 08/14/2020   Moderna Sars-Covid-2 Vaccination 12/16/2019, 01/13/2020, 09/22/2020   Pneumococcal Conjugate-13 10/15/2014   Pneumococcal-Unspecified 11/14/2012   Tdap 09/05/2016    Past Medical History:  Diagnosis Date   Allergy    Alpha 1-antitrypsin PiMS phenotype    Anemia 2014   mild   Anxiety    Arthritis    Asthma    Blood transfusion without reported diagnosis    Cataract    Chronic kidney disease    early stages of some insufficiency   Complication of anesthesia    passes out 24 hrs after getting   COPD (chronic obstructive pulmonary disease) ([place])    DDD (degenerative disc disease)    Depression    with anxiety   Diabetes mellitus    Diverticulitis    Diverticulosis    Esophageal dysmotility 03/01/06   mild   Fibromyalgia    GERD (gastroesophageal reflux disease)    Glaucoma    Heart murmur    "years ago"   Hypercholesterolemia    Hypertension  IPF (idiopathic pulmonary fibrosis) ([facility])    Shingles    Shortness of breath dyspnea    Sleep apnea    wears CPAP   Stroke ([place]) 2010   3 tia's    Tobacco History: Social History   Tobacco Use  Smoking Status Former Smoker   Packs/day: 1.00   Years: 22.00   Pack years: 22.00   Types: Cigarettes   Start date: 89   Quit date: 04/14/2012   Years since quitting: 8.8  Smokeless Tobacco Never Used   Counseling given: Not Answered   Outpatient Medications Prior to Visit  Medication Sig Dispense Refill   acetaminophen (TYLENOL) 325 MG  tablet Take 650 mg by mouth every 6 (six) hours as needed (joint pain/swelling).     atorvastatin (LIPITOR) 40 MG tablet Take 1 tablet by mouth daily.  11   brimonidine (ALPHAGAN) 0.15 % ophthalmic solution Place 1 drop into both eyes 3 (three) times daily.  3   buPROPion (WELLBUTRIN XL) 300 MG 24 hr tablet Take 1 tablet by mouth every morning.     cyclobenzaprine (FLEXERIL) 10 MG tablet Take 10 mg by mouth at bedtime as needed for muscle spasms.   0   glipiZIDE (GLUCOTROL XL) 5 MG 24 hr tablet TK 1 T PO QD  11   hydrochlorothiazide (MICROZIDE) 12.5 MG capsule Take 1 capsule (12.5 mg total) by mouth daily. 90 capsule 1   HYDROcodone-acetaminophen (NORCO/VICODIN) 5-325 MG tablet Take 1 tablet by mouth 2 (two) times daily as needed.     latanoprost (XALATAN) 0.005 % ophthalmic solution 1 drop at bedtime. Left eye     losartan (COZAAR) 50 MG tablet Take 50 mg by mouth daily.     meclizine (ANTIVERT) 12.5 MG tablet Take 1 tablet (12.5 mg total) by mouth 3 (three) times daily as needed for dizziness. 16 tablet 0   Menthol, Topical Analgesic, 4 % GEL Apply 1 application topically 3 (three) times daily as needed (for back pain).     metFORMIN (GLUCOPHAGE-XR) 500 MG 24 hr tablet TK 2 TS PO BID  11   metoprolol succinate (TOPROL-XL) 25 MG 24 hr tablet TAKE 1 TABLET(25 MG) BY MOUTH DAILY 90 tablet 1   Multiple Vitamin (MULTIVITAMIN) capsule Take 1 capsule by mouth daily.     omeprazole (PRILOSEC) 40 MG capsule TAKE 1 CAPSULE(40 MG) BY MOUTH TWICE DAILY BEFORE LUNCH AND SUPPER 90 capsule 3   ondansetron (ZOFRAN ODT) 4 MG disintegrating tablet Take 1 tablet (4 mg total) by mouth every 8 (eight) hours as needed for nausea or vomiting. 10 tablet 0   PROLASTIN-C 1000 MG SOLR injection Inject 1,000 mg into the skin once a week.     promethazine (PHENERGAN) 25 MG tablet Take 1 tablet (25 mg total) by mouth every 6 (six) hours as needed for nausea or vomiting. 12 tablet 0   sertraline (ZOLOFT) 50  MG tablet Take 50 mg by mouth daily.     Tiotropium Bromide-Olodaterol (STIOLTO RESPIMAT) 2.5-2.5 MCG/ACT AERS Inhale 2 puffs into the lungs daily. 4 g 0   Tiotropium Bromide-Olodaterol (STIOLTO RESPIMAT) 2.5-2.5 MCG/ACT AERS Inhale 2 puffs into the lungs daily. 4 g 5   traMADol (ULTRAM) 50 MG tablet Take 1-2 tablets (50-100 mg total) by mouth every 6 (six) hours as needed (mild pain). 30 tablet 0   albuterol (PROVENTIL) (2.5 MG/3ML) 0.083% nebulizer solution Take 3 mLs (2.5 mg total) by nebulization every 6 (six) hours as needed for wheezing or shortness of breath. [place]  mL 1   predniSONE (DELTASONE) 20 MG tablet Take 1 tablet (20 mg total) by mouth daily with breakfast. (Patient not taking: Reported on 01/29/2021) 5 tablet 0   No facility-administered medications prior to visit.     Review of Systems:   Constitutional:   No  weight loss, night sweats,  Fevers, chills +, fatigue, or  lassitude.  HEENT:   No headaches,  Difficulty swallowing,  Tooth/dental problems, or  Sore throat,                No sneezing, itching, ear ache, nasal congestion, post nasal drip,   CV:  No chest pain,  Orthopnea, PND, swelling in lower extremities, anasarca, dizziness, palpitations, syncope.   GI  No heartburn, indigestion, abdominal pain, nausea, vomiting, diarrhea, change in bowel habits, loss of appetite, bloody stools.   Resp:    No chest wall deformity  Skin: no rash or lesions.  GU: no dysuria, change in color of urine, no urgency or frequency.  No flank pain, no hematuria   MS:  No joint pain or swelling.  No decreased range of motion.  No back pain.    Physical Exam  BP 138/80 (BP Location: Left Arm, Cuff Size: Normal)    Pulse (!) 55    Temp 97.8 F (36.6 C)    Ht 5\' 7"  (1.702 m)    Wt 203 lb (92.1 kg)    LMP  (LMP Unknown)    SpO2 97%    BMI 31.79 kg/m   GEN: A/Ox3; pleasant , NAD, elderly   HEENT:  [place]/AT,    NOSE-clear, THROAT-clear, no lesions, no postnasal drip or exudate  noted.   NECK:  Supple w/ fair ROM; no JVD; normal carotid impulses w/o bruits; no thyromegaly or nodules palpated; no lymphadenopathy.    RESP  Clear  P & A; w/o, wheezes/ rales/ or rhonchi. no accessory muscle use, no dullness to percussion  CARD:  RRR, no m/r/g, no peripheral edema, pulses intact, no cyanosis or clubbing.  GI:   Soft & nt; nml bowel sounds; no organomegaly or masses detected.   [name]: Warm bil, no deformities or joint swelling noted.   Neuro: alert, no focal deficits noted.    Skin: Warm, no lesions or rashes     BMET   BNP No results found for: BNP  ProBNP No results found for: PROBNP  Imaging:     PFT Results Latest Ref Rng & Units 02/14/2018 09/13/2017 06/08/2017 08/16/2016 03/04/2016 08/28/2015  FVC-Pre L 2.44 2.45 2.63 2.69 2.79 2.81  FVC-Predicted Pre % 70 70 75 76 79 79  FVC-Post L - - - - 2.84 2.75  FVC-Predicted Post % - - - - 81 77  Pre FEV1/FVC % % 78 77 75 79 75 75  Post FEV1/FCV % % - - - - 77 79  FEV1-Pre L 1.89 1.90 1.97 2.11 2.10 2.10  FEV1-Predicted Pre % 72 72 74 79 78 77  FEV1-Post L - - - - 2.20 2.16  DLCO uncorrected ml/min/mmHg 12.16 12.31 13.31 14.10 14.76 15.56  DLCO UNC% % 41 42 45 48 50 53  DLCO corrected ml/min/mmHg - 12.35 13.43 13.90 15.20 -  DLCO COR %Predicted % - 42 46 47 52 -  DLVA Predicted % 64 68 63 61 66 70  TLC L - - - - 4.85 4.11  TLC % Predicted % - - - - 86 73  RV % Predicted % - - - - 84 59  No results found for: NITRICOXIDE      Assessment & Plan:   COPD exacerbation ([place]) Recent COPD exacerbation with probable lingular pneumonia.  Patient is clinically better on antibiotics however will extend out with appropriate dosing.  She is advised to use Augmentin twice daily. We will continue Stiolto for now.  Patient assistance paperwork initiated. We will need chest x-ray on return in 6 weeks  Plan  Patient Instructions  Extend Augmentin 875mg  Twice daily  For 3  days -take with food  Mucinex DM  Twice daily  As needed  Cough/congestion  Continue on Stiolto 2 puffs daily .  Begin Stiolto patient assistance paperwork .  Albuterol neb As needed   Follow up in 6 weeks with Dr. Chase Caller or Ryenne Lynam NP in 6 weeks with Chest xray .  Please contact office for sooner follow up if symptoms do not improve or worsen or seek emergency care        Alpha-1-antitrypsin deficiency [facility]) Previously on Prolastin C replacement currently on hold due to insurance decline Continue with insurance approval process  ILD (interstitial lung disease) ([place]) IPF-currently stable.  Patient has been intolerant to aspirin in the past.  Declines additional antifibrotic's.  Continue to monitor     Rexene Edison, NP 01/29/2021

## 2021-01-29 NOTE — Assessment & Plan Note (Signed)
Previously on Prolastin C replacement currently on hold due to insurance decline Continue with insurance approval process

## 2021-01-29 NOTE — Assessment & Plan Note (Signed)
Recent COPD exacerbation with probable lingular pneumonia.  Patient is clinically better on antibiotics however will extend out with appropriate dosing.  She is advised to use Augmentin twice daily. We will continue Stiolto for now.  Patient assistance paperwork initiated. We will need chest x-ray on return in 6 weeks  Plan  Patient Instructions  Extend Augmentin 875mg  Twice daily  For 3  days -take with food  Mucinex DM Twice daily  As needed  Cough/congestion  Continue on Stiolto 2 puffs daily .  Begin Stiolto patient assistance paperwork .  Albuterol neb As needed   Follow up in 6 weeks with Dr. Chase Caller or Blythe Hartshorn NP in 6 weeks with Chest xray .  Please contact office for sooner follow up if symptoms do not improve or worsen or seek emergency care

## 2021-02-02 ENCOUNTER — Telehealth: Payer: Self-pay | Admitting: Pharmacist

## 2021-02-02 ENCOUNTER — Telehealth: Payer: Self-pay | Admitting: Adult Health

## 2021-02-02 NOTE — Telephone Encounter (Signed)
Called and spoke with Gregary Signs, [place].  Diagnosis code for Albuterol given per Tammy,NP's prescription given. Nothing further at this time.

## 2021-02-02 NOTE — Telephone Encounter (Addendum)
Faxed Database administrator, insurance card copy, and Medical Care Provider Statement to [employer] for grant.   Copay assistance is at capacity. Submitted application to be placed on waiting list.  Submitted application for: premium (monthly insurance payment), travel expenses (medical travel expenses), and medical expenses (medical visit copayment, labs, etc)  Fax: [phone] Phone: [phone]  Knox Saliva, PharmD, MPH Clinical Pharmacist (Rheumatology and Pulmonology)

## 2021-02-02 NOTE — Telephone Encounter (Signed)
Received notification from [facility], CPhT, that patient is unable to afford Prolastin-C. Copay is $700/month.  Alpha-1 anti-trypsin deficiency grant is open.  Patient was previously receiving Prolactin-C at home.   Fatima Sanger application completed over the phone however application requires medical statement, income documentation, and insurance card. Patient to email information to me today. Requested fax of medical form from [place]. Will complete and upload into portal  Knox Saliva, PharmD, MPH Clinical Pharmacist (Rheumatology and Pulmonology)

## 2021-02-10 NOTE — Telephone Encounter (Signed)
[facility] unable to provide enrollment information since we are not authorized by patient. Provided patient with phone number to verify status. She has not received any status update since last week. She's on the waiting list for the grant's funds for copay assistance.

## 2021-02-10 NOTE — Telephone Encounter (Addendum)
Patient notified me that she is approved for medical expenses grant.  Per her [facility] nurse, Shirlean Mylar, patient is on the waiting list for all available Prolastin-C grants. She enrolled into waiting list for [employer] for copay assistance which usually opens up during the first week of each month. Patient provided with phone number for [employer].  Shirlean Mylar also stated that patient should call grant lines at the beginning of the month to see if they have opened up.  To receive Prolastin-C at [facility] no Josem Kaufmann is required. Patient would be responsible for 20% of coinsurance. Fatima Sanger would cover coinsurance, max $2000 per calendar year. Patient advised  J-code: [id number]  Will f/u with patient to see if she'd like to wait for grant and move forward with infusion center. Will have to place orders for [facility] if so  Knox Saliva, PharmD, MPH Clinical Pharmacist (Rheumatology and Pulmonology)

## 2021-02-17 DIAGNOSIS — M25561 Pain in right knee: Secondary | ICD-10-CM | POA: Diagnosis not present

## 2021-02-19 NOTE — Telephone Encounter (Addendum)
Received message from patient that she was approved for both medical and pharmacy grants for Prolastin-C through [employer].  Patient ID: [id number]  Medical:  - $2000 grant that would cover coinsurance, copayments for labs, DME, nursing services, infusion therapy costs effective 3/1/2 - premium assistance grant that would cover max $416.67 per month towards premium  Pharmacy (for copay assistance) - $8000 per calendar year, effective 02/12/21  Left VM with patient. She was previously receiving Prolastin-C through her pharmacy benefit. Will route to triage and Brayton Layman Chipps (with [facility]) in case patient follows up  Knox Saliva, PharmD, MPH Clinical Pharmacist (Rheumatology and Pulmonology)

## 2021-02-19 NOTE — Telephone Encounter (Addendum)
Patient left VM stating she'd like to remain with same benefit as she was previously receiving (through pharmacy). I've reached out to [place] at [employer] (her case manager) in case anything is needed from provider office. Shirlean Mylar is not in office today so I left VM

## 2021-02-21 ENCOUNTER — Other Ambulatory Visit: Payer: Self-pay | Admitting: Gastroenterology

## 2021-02-24 NOTE — Telephone Encounter (Signed)
-----   Message from [place], CPhT sent at 02/23/2021 12:19 PM EDT ----- Regarding: RE: PT needing Medication Assistance Hi Annaleigha Woo, I outreached PT and I explained to her that I do not have the resources to answer her question about if the nurse coming out to her house would be included in the grant. I advised PT to follow up with Shirlean Mylar, who has been working with the PT to help to get approved for a grant. PT states that she understands to call Robin with [facility] to verify.  ----- Message ----- From: Cassandria Anger, [facility] Sent: 02/22/2021   4:43 PM EDT To: Loretha Brasil, CPhT Subject: RE: PT needing Medication Assistance           Hi Monica! Aniston Christman just reached out to me now and wanted to double check she was okay to get her Prolastin-C tomorrow afternoon? Her nurse usually comes at 4-4:30pm, but I advised you'd be in touch with her with to provide guidance.  Is there any way you could reach out to her in on Tuesday morning to clear up confusion? I don't want to muddy the conversation or care coordination with her nurse.  Thanks! Abigaile Rossie  ----- Message ----- From: Loretha Brasil, CPhT Sent: 02/22/2021   8:49 AM EDT To: Cassandria Anger, RPH Subject: RE: PT needing Medication Assistance           Yes I will follow up with PT to see if she has any questions or concerns at this time. ----- Message ----- From: Cassandria Anger, [facility] Sent: 02/19/2021   4:15 PM EDT To: Beatriz Chancellor, CPhT, Monica Chipps, CPhT Subject: RE: PT needing Medication Assistance           Thanks! I reached out to the patient, but didn't know how to guide her one way or another. Will your team be reaching out to her directly?  Marda Breidenbach ----- Message ----- From: Loretha Brasil, CPhT Sent: 02/19/2021   4:06 PM EDT To: Cassandria Anger, RPH Subject: RE: PT needing Medication Assistance           Hi Leighanna Kirn- That is great news for the patient! I appreciate your help with this and I do not believe there is anything else  needed from your end at this time.  ----- Message ----- From: Cassandria Anger, [facility] Sent: 02/19/2021  11:33 AM EDT To: Beatriz Chancellor, CPhT, Monica Chipps, CPhT Subject: RE: PT needing Medication Assistance           Hi Monica!  Maliaka Brasington was approved through grants for [employer]. Is there anything we need to do from our end since she got a pharmacy grant for this year as well?  ----- Message ----- From: Loretha Brasil, CPhT Sent: 02/01/2021  11:43 AM EDT To: Cassandria Anger, RPH Subject: PT needing Medication Assistance               This PT expressed to me a need for medication assistance for her Prolastin C. She states to me that her insurance is not wanting to cover this for her. Please advise. Thank you.

## 2021-03-11 DIAGNOSIS — H353190 Nonexudative age-related macular degeneration, unspecified eye, stage unspecified: Secondary | ICD-10-CM | POA: Insufficient documentation

## 2021-03-11 DIAGNOSIS — G4733 Obstructive sleep apnea (adult) (pediatric): Secondary | ICD-10-CM | POA: Insufficient documentation

## 2021-03-11 DIAGNOSIS — N183 Chronic kidney disease, stage 3 unspecified: Secondary | ICD-10-CM | POA: Insufficient documentation

## 2021-03-11 DIAGNOSIS — F341 Dysthymic disorder: Secondary | ICD-10-CM | POA: Insufficient documentation

## 2021-03-11 DIAGNOSIS — F419 Anxiety disorder, unspecified: Secondary | ICD-10-CM | POA: Insufficient documentation

## 2021-03-11 DIAGNOSIS — M797 Fibromyalgia: Secondary | ICD-10-CM | POA: Insufficient documentation

## 2021-03-11 DIAGNOSIS — U071 COVID-19: Secondary | ICD-10-CM | POA: Insufficient documentation

## 2021-03-11 DIAGNOSIS — E1121 Type 2 diabetes mellitus with diabetic nephropathy: Secondary | ICD-10-CM | POA: Insufficient documentation

## 2021-03-11 DIAGNOSIS — K5732 Diverticulitis of large intestine without perforation or abscess without bleeding: Secondary | ICD-10-CM | POA: Insufficient documentation

## 2021-03-12 ENCOUNTER — Ambulatory Visit: Payer: PPO | Admitting: Adult Health

## 2021-03-12 ENCOUNTER — Encounter: Payer: Self-pay | Admitting: Adult Health

## 2021-03-12 ENCOUNTER — Other Ambulatory Visit: Payer: Self-pay

## 2021-03-12 ENCOUNTER — Ambulatory Visit (INDEPENDENT_AMBULATORY_CARE_PROVIDER_SITE_OTHER): Payer: PPO

## 2021-03-12 VITALS — BP 124/68 | HR 63 | Ht 67.0 in | Wt 204.0 lb

## 2021-03-12 DIAGNOSIS — E8801 Alpha-1-antitrypsin deficiency: Secondary | ICD-10-CM

## 2021-03-12 DIAGNOSIS — J441 Chronic obstructive pulmonary disease with (acute) exacerbation: Secondary | ICD-10-CM

## 2021-03-12 DIAGNOSIS — J84112 Idiopathic pulmonary fibrosis: Secondary | ICD-10-CM

## 2021-03-12 DIAGNOSIS — J449 Chronic obstructive pulmonary disease, unspecified: Secondary | ICD-10-CM | POA: Diagnosis not present

## 2021-03-12 MED ORDER — TIOTROPIUM BROMIDE-OLODATEROL 2.5-2.5 MCG/ACT IN AERS: 2.0000 | INHALATION_SPRAY | Freq: Every day | RESPIRATORY_TRACT | 0 refills | Status: DC

## 2021-03-12 NOTE — Patient Instructions (Addendum)
Mucinex DM Twice daily  As needed  Cough/congestion  Continue on Stiolto 2 puffs daily .  Albuterol As needed  . Begin Stiolto patient assistance paperwork .  Call back if new insurance cards do not work . (if unable to afford may need to change to [employer])  Albuterol neb As needed   Chest xray today  Follow up in 3 months  with Dr. Chase Caller or Klani Caridi NP and As needed   .  Please contact office for sooner follow up if symptoms do not improve or worsen or seek emergency care

## 2021-03-12 NOTE — Progress Notes (Signed)
Patient givent patient assitance form to fill out for [employer].  Patient will bring back for provider to fill out.

## 2021-03-12 NOTE — Assessment & Plan Note (Signed)
IPF-progressive changes on chest x-ray.  Patient has been unable to tolerate Esbriet.  Does not wish to try Ofev  Plan  Patient Instructions  Mucinex DM Twice daily  As needed  Cough/congestion  Continue on Stiolto 2 puffs daily .  Albuterol As needed  . Begin Stiolto patient assistance paperwork .  Call back if new insurance cards do not work . (if unable to afford may need to change to [employer]bs)  Albuterol neb As needed   Chest xray today  Follow up in 3 months  with Dr. Chase Caller or Zanai Mallari NP and As needed   .  Please contact office for sooner follow up if symptoms do not improve or worsen or seek emergency care

## 2021-03-12 NOTE — Assessment & Plan Note (Signed)
Alpha-1 MZ phenotype with low levels.  Now back on replacement with Prolastin C  Plan  Patient Instructions  Mucinex DM Twice daily  As needed  Cough/congestion  Continue on Stiolto 2 puffs daily .  Albuterol As needed  . Begin Stiolto patient assistance paperwork .  Call back if new insurance cards do not work . (if unable to afford may need to change to [employer])  Albuterol neb As needed   Chest xray today  Follow up in 3 months  with Dr. Chase Caller or Ed Rayson NP and As needed   .  Please contact office for sooner follow up if symptoms do not improve or worsen or seek emergency care

## 2021-03-12 NOTE — Assessment & Plan Note (Signed)
Recent COPD exacerbation with probable pneumonia.  Now improved. Will continue on LABA LAMA combo if able to get affordable through insurance. For now we will try insurance co-pay cards with direct Rx and alternative co-pay card.  If unable to get through insurance may need to consider changing over to nebulizer form such as Garlon Hatchet and Yupelri Check chest x-ray today Plan   Patient Instructions  Mucinex DM Twice daily  As needed  Cough/congestion  Continue on Stiolto 2 puffs daily .  Albuterol As needed  . Begin Stiolto patient assistance paperwork .  Call back if new insurance cards do not work . (if unable to afford may need to change to Nebs)  Albuterol neb As needed   Chest xray today  Follow up in 3 months  with Dr. Chase Caller or Maurice Ramseur NP and As needed   .  Please contact office for sooner follow up if symptoms do not improve or worsen or seek emergency care

## 2021-03-12 NOTE — Progress Notes (Signed)
@Patient  ID: Angela [name], female    DOB: [birth year], 75 y.o.   MRN: [id number]  Chief Complaint  Patient presents with  . Follow-up    Referring provider: Maurice Small, MD  HPI: 75 year old female former smoker followed for COPD, emphysema (MZ phenotype with low levels on alpha-1 replacement) and idiopathic pulmonary fibrosis Patient had COVID-19 infection in February 2021  TEST/EVENTS :  High-resolution CT chest November 2020 showed stable ILD changes, probable UIP since 2018, emphysema  Pulmonary function test February 14, 2018 FEV1 72%, ratio 78, FVC 70%, and 41%  Trial of Esbriet in the past unable to tolerate due to GI side effects  03/12/2021 Follow up : COPD, pneumonia, IPF, alpha-1 replacement Patient returns for a 6-week follow-up.  Patient was seen last visit for a slow to resolve COPD exacerbation.  Chest x-ray had showed progressive ILD changes with increased opacity along the lingula which was suspicious for pneumonia.  She was given Augmentin and a short course of prednisone.  And started on Stiolto.  Patient says that she feels Stiolto has really worked well.  Unfortunately insurance is not covering.  We discussed several options today including patient assistance program paperwork and trying direct Rx and another prescription co-pay card assistance.  We also discussed if this does not work considering changing to nebulizer form to see if this is more affordable. Since last visit patient has finished her antibiotics.  She says she is feeling better.  She has decreased cough and congestion.  She still gets short of breath with activities.  She denies any hemoptysis chest pain orthopnea PND or increased leg swelling.  Appetite is good.  Patient has known emphysema with alpha-1 MZ phenotype with low levels.  She has been on replacement therapy with Prolastin C.  She had been off of this for the last 3 months due to insurance issues but has recently been restarted and says that she  is starting to feel better.  Patient has known idiopathic pulmonary fibrosis.  She has been tried on antifibrotic's with Esbriet but was unable to tolerate due to GI side effects.  She declines ongoing for antifibrotic trials.   Allergies  Allergen Reactions  . Tizanidine Other (See Comments)    Severe decrease in BP   . Influenza Vaccines     Other reaction(s): becomes sick with the flu  . Other     Other reaction(s): Hives / hospitalization  . Vicodin Hp [Hydrocodone-Acetaminophen]     Other reaction(s): nausea/vomiting  . Betadine [Povidone Iodine] Rash  . Ciprofloxacin Rash  . Codeine Nausea And Vomiting  . Iohexol      Desc: Pt states several years ago during a CT scan w/ iv cotnrast she had severe nausea and vomiting, sob w/ difficulty breathing and swallowing.  She had full 13hr. premeds today (5/14/7) and did fine w/o complications.     Immunization History  Administered Date(s) Administered  . Influenza, High Dose Seasonal PF 09/13/2017, 08/14/2020  . Moderna Sars-Covid-2 Vaccination 12/16/2019, 01/13/2020, 09/22/2020  . Pneumococcal Conjugate-13 10/15/2014  . Pneumococcal-Unspecified 11/14/2012  . Tdap 09/05/2016    Past Medical History:  Diagnosis Date  . Allergy   . Alpha 1-antitrypsin PiMS phenotype   . Anemia 2014   mild  . Anxiety   . Arthritis   . Asthma   . Blood transfusion without reported diagnosis   . Cataract   . Chronic kidney disease    early stages of some insufficiency  . Complication of anesthesia  passes out 24 hrs after getting  . COPD (chronic obstructive pulmonary disease) ([place])   . DDD (degenerative disc disease)   . Depression    with anxiety  . Diabetes mellitus   . Diverticulitis   . Diverticulosis   . Esophageal dysmotility 03/01/06   mild  . Fibromyalgia   . GERD (gastroesophageal reflux disease)   . Glaucoma   . Heart murmur    "years ago"  . Hypercholesterolemia   . Hypertension   . IPF (idiopathic pulmonary  fibrosis) ([place])   . Shingles   . Shortness of breath dyspnea   . Sleep apnea    wears CPAP  . Stroke [facility]) 2010   3 tia's    Tobacco History: Social History   Tobacco Use  Smoking Status Former Smoker  . Packs/day: 1.00  . Years: 22.00  . Pack years: 22.00  . Types: Cigarettes  . Start date: 58  . Quit date: 04/14/2012  . Years since quitting: 8.9  Smokeless Tobacco Never Used   Counseling given: Not Answered   Outpatient Medications Prior to Visit  Medication Sig Dispense Refill  . acetaminophen (TYLENOL) 325 MG tablet Take 650 mg by mouth every 6 (six) hours as needed (joint pain/swelling).    Marland Kitchen albuterol (PROVENTIL) (2.5 MG/3ML) 0.083% nebulizer solution Take 3 mLs (2.5 mg total) by nebulization every 6 (six) hours as needed for wheezing or shortness of breath. 90 mL 5  . atorvastatin (LIPITOR) 40 MG tablet Take 1 tablet by mouth daily.  11  . brimonidine (ALPHAGAN) 0.15 % ophthalmic solution Place 1 drop into both eyes 3 (three) times daily.  3  . buPROPion (WELLBUTRIN XL) 300 MG 24 hr tablet Take 1 tablet by mouth every morning.    . cyclobenzaprine (FLEXERIL) 10 MG tablet Take 10 mg by mouth at bedtime as needed for muscle spasms.   0  . glipiZIDE (GLUCOTROL XL) 5 MG 24 hr tablet TK 1 T PO QD  11  . hydrochlorothiazide (MICROZIDE) 12.5 MG capsule Take 1 capsule (12.5 mg total) by mouth daily. 90 capsule 1  . HYDROcodone-acetaminophen (NORCO/VICODIN) 5-325 MG tablet Take 1 tablet by mouth 2 (two) times daily as needed.    . latanoprost (XALATAN) 0.005 % ophthalmic solution 1 drop at bedtime. Left eye    . losartan (COZAAR) 50 MG tablet Take 50 mg by mouth daily.    . meclizine (ANTIVERT) 12.5 MG tablet Take 1 tablet (12.5 mg total) by mouth 3 (three) times daily as needed for dizziness. 16 tablet 0  . Menthol, Topical Analgesic, 4 % GEL Apply 1 application topically 3 (three) times daily as needed (for back pain).    . metFORMIN (GLUCOPHAGE-XR) 500 MG 24 hr tablet TK 2  TS PO BID  11  . metoprolol succinate (TOPROL-XL) 25 MG 24 hr tablet TAKE 1 TABLET(25 MG) BY MOUTH DAILY 90 tablet 1  . Multiple Vitamin (MULTIVITAMIN) capsule Take 1 capsule by mouth daily.    Marland Kitchen omeprazole (PRILOSEC) 40 MG capsule TAKE 1 CAPSULE(40 MG) BY MOUTH TWICE DAILY BEFORE LUNCH AND SUPPER 90 capsule 3  . ondansetron (ZOFRAN ODT) 4 MG disintegrating tablet Take 1 tablet (4 mg total) by mouth every 8 (eight) hours as needed for nausea or vomiting. 10 tablet 0  . PROLASTIN-C 1000 MG SOLR injection Inject 1,000 mg into the skin once a week.    . promethazine (PHENERGAN) 25 MG tablet Take 1 tablet (25 mg total) by mouth every 6 (six) hours as  needed for nausea or vomiting. 12 tablet 0  . sertraline (ZOLOFT) 50 MG tablet Take 50 mg by mouth daily.    . Tiotropium Bromide-Olodaterol (STIOLTO RESPIMAT) 2.5-2.5 MCG/ACT AERS Inhale 2 puffs into the lungs daily. 4 g 0  . Tiotropium Bromide-Olodaterol (STIOLTO RESPIMAT) 2.5-2.5 MCG/ACT AERS Inhale 2 puffs into the lungs daily. 4 g 5  . traMADol (ULTRAM) 50 MG tablet Take 1-2 tablets (50-100 mg total) by mouth every 6 (six) hours as needed (mild pain). 30 tablet 0  . predniSONE (DELTASONE) 20 MG tablet Take 1 tablet (20 mg total) by mouth daily with breakfast. 5 tablet 0   No facility-administered medications prior to visit.     Review of Systems:   Constitutional:   No  weight loss, night sweats,  Fevers, chills,  +fatigue, or  lassitude.  HEENT:   No headaches,  Difficulty swallowing,  Tooth/dental problems, or  Sore throat,                No sneezing, itching, ear ache, nasal congestion, post nasal drip,   CV:  No chest pain,  Orthopnea, PND, swelling in lower extremities, anasarca, dizziness, palpitations, syncope.   GI  No heartburn, indigestion, abdominal pain, nausea, vomiting, diarrhea, change in bowel habits, loss of appetite, bloody stools.   Resp:  .  No chest wall deformity  Skin: no rash or lesions.  GU: no dysuria, change  in color of urine, no urgency or frequency.  No flank pain, no hematuria   MS:  No joint pain or swelling.  No decreased range of motion.  No back pain.    Physical Exam  BP 124/68   Pulse 63   Ht 5\' 7"  (1.702 m)   Wt 204 lb (92.5 kg)   LMP  (LMP Unknown)   SpO2 98%   BMI 31.95 kg/m   GEN: A/Ox3; pleasant , NAD,    HEENT:  [place]/AT,   , NOSE-clear, THROAT-clear, no lesions, no postnasal drip or exudate noted.   NECK:  Supple w/ fair ROM; no JVD; normal carotid impulses w/o bruits; no thyromegaly or nodules palpated; no lymphadenopathy.    RESP bibasilar crackles. no accessory muscle use, no dullness to percussion  CARD:  RRR, no m/r/g, no peripheral edema, pulses intact, no cyanosis or clubbing.  GI:   Soft & nt; nml bowel sounds; no organomegaly or masses detected.   [name]: Warm bil, no deformities or joint swelling noted.   Neuro: alert, no focal deficits noted.    Skin: Warm, no lesions or rashes    Lab Results:  CBC  BMET  No results found for: BNP  ProBNP No results found for: PROBNP  Imaging: No results found.    PFT Results Latest Ref Rng & Units 02/14/2018 09/13/2017 06/08/2017 08/16/2016 03/04/2016 08/28/2015  FVC-Pre L 2.44 2.45 2.63 2.69 2.79 2.81  FVC-Predicted Pre % 70 70 75 76 79 79  FVC-Post L - - - - 2.84 2.75  FVC-Predicted Post % - - - - 81 77  Pre FEV1/FVC % % 78 77 75 79 75 75  Post FEV1/FCV % % - - - - 77 79  FEV1-Pre L 1.89 1.90 1.97 2.11 2.10 2.10  FEV1-Predicted Pre % 72 72 74 79 78 77  FEV1-Post L - - - - 2.20 2.16  DLCO uncorrected ml/min/mmHg 12.16 12.31 13.31 14.10 14.76 15.56  DLCO UNC% % 41 42 45 48 50 53  DLCO corrected ml/min/mmHg - 12.35 13.43 13.90 15.20 -  DLCO COR %Predicted % - 42 46 47 52 -  DLVA Predicted % 64 68 63 61 66 70  TLC L - - - - 4.85 4.11  TLC % Predicted % - - - - 86 73  RV % Predicted % - - - - 84 59    No results found for: NITRICOXIDE      Assessment & Plan:   COPD exacerbation (HCC) Recent  COPD exacerbation with probable pneumonia.  Now improved. Will continue on LABA LAMA combo if able to get affordable through insurance. For now we will try insurance co-pay cards with direct Rx and alternative co-pay card.  If unable to get through insurance may need to consider changing over to nebulizer form such as Garlon Hatchet and Yupelri Check chest x-ray today Plan   Patient Instructions  Mucinex DM Twice daily  As needed  Cough/congestion  Continue on Stiolto 2 puffs daily .  Albuterol As needed  . Begin Stiolto patient assistance paperwork .  Call back if new insurance cards do not work . (if unable to afford may need to change to Nebs)  Albuterol neb As needed   Chest xray today  Follow up in 3 months  with Dr. Chase Caller or [place] Nie NP and As needed   .  Please contact office for sooner follow up if symptoms do not improve or worsen or seek emergency care        Alpha-1-antitrypsin deficiency ([place]) Alpha-1 MZ phenotype with low levels.  Now back on replacement with Prolastin C  Plan  Patient Instructions  Mucinex DM Twice daily  As needed  Cough/congestion  Continue on Stiolto 2 puffs daily .  Albuterol As needed  . Begin Stiolto patient assistance paperwork .  Call back if new insurance cards do not work . (if unable to afford may need to change to Nebs)  Albuterol neb As needed   Chest xray today  Follow up in 3 months  with Dr. Chase Caller or Jaiyden Laur NP and As needed   .  Please contact office for sooner follow up if symptoms do not improve or worsen or seek emergency care        IPF (idiopathic pulmonary fibrosis) ([place]) IPF-progressive changes on chest x-ray.  Patient has been unable to tolerate Esbriet.  Does not wish to try Ofev  Plan  Patient Instructions  Mucinex DM Twice daily  As needed  Cough/congestion  Continue on Stiolto 2 puffs daily .  Albuterol As needed  . Begin Stiolto patient assistance paperwork .  Call back if new insurance cards do not work .  (if unable to afford may need to change to Nebs)  Albuterol neb As needed   Chest xray today  Follow up in 3 months  with Dr. Chase Caller or Kingsly Kloepfer NP and As needed   .  Please contact office for sooner follow up if symptoms do not improve or worsen or seek emergency care           Rexene Edison, NP 03/12/2021

## 2021-05-04 ENCOUNTER — Ambulatory Visit: Payer: PPO | Admitting: Internal Medicine

## 2021-06-23 ENCOUNTER — Other Ambulatory Visit: Payer: Self-pay

## 2021-06-23 ENCOUNTER — Encounter: Payer: Self-pay | Admitting: Internal Medicine

## 2021-06-23 ENCOUNTER — Ambulatory Visit: Payer: PPO | Admitting: Internal Medicine

## 2021-06-23 VITALS — BP 130/80 | HR 68 | Temp 98.4°F | Ht 67.0 in | Wt 207.2 lb

## 2021-06-23 DIAGNOSIS — K439 Ventral hernia without obstruction or gangrene: Secondary | ICD-10-CM | POA: Diagnosis not present

## 2021-06-23 DIAGNOSIS — J439 Emphysema, unspecified: Secondary | ICD-10-CM | POA: Diagnosis not present

## 2021-06-23 DIAGNOSIS — J84112 Idiopathic pulmonary fibrosis: Secondary | ICD-10-CM

## 2021-06-23 DIAGNOSIS — E8801 Alpha-1-antitrypsin deficiency: Secondary | ICD-10-CM

## 2021-06-23 DIAGNOSIS — J849 Interstitial pulmonary disease, unspecified: Secondary | ICD-10-CM | POA: Diagnosis not present

## 2021-06-23 DIAGNOSIS — J8410 Pulmonary fibrosis, unspecified: Secondary | ICD-10-CM | POA: Diagnosis not present

## 2021-06-23 DIAGNOSIS — K432 Incisional hernia without obstruction or gangrene: Secondary | ICD-10-CM

## 2021-06-23 MED ORDER — STIOLTO RESPIMAT 2.5-2.5 MCG/ACT IN AERS
2.0000 | INHALATION_SPRAY | Freq: Every day | RESPIRATORY_TRACT | 0 refills | Status: DC
Start: 2021-06-23 — End: 2021-09-17

## 2021-06-23 NOTE — Patient Instructions (Addendum)
Pulmonary emphysema with fibrosis of lung ([place]) Alpha-1-antitrypsin deficiency ([place]) Pulmonary emphysema, unspecified emphysema type ([place]) IPF (idiopathic pulmonary fibrosis) ([place])   - we need to restage and see where things are - currently stable on prolastin and stiolto  - you had covid feb 2021 and July 2022  Plan - do HRCT supine and prone in 2-3 months  (last in 2020) -  Do spirometry and dlco (last in 2020)  - in 2-3 months - continue stiolto scheduled - continue prolastin scheduled  Ventral and Incisional Hernia  - seems to be getting worse  PLan  - refer [facility] in [place]  Followup  - 2-3 months but after completing above

## 2021-06-23 NOTE — Progress Notes (Signed)
OV 10/10/2016  Chief Complaint  Patient presents with   Follow-up    Pt states overall her breathing has slightly improved since last OV. Pt states she has prod cough with yellow. Pt c/o midsternal CP 20 min after 2nd or 3rd dose of Esbriet.     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 This was also personally visualized. The main issues that were 3+3 times daily of Pirfenidone (Esbriet) she's having significant GI side effects of fatigue, nausea and bloated sensation and even chest pain after her dose. She's tried ginger she's had between meals. She has pacingg doses for several hours. All and communication with the help desk - but no improvement. She rates his symptoms is severe. Nevertheless because of the severity of pulmonary fibrosis and she is willing to continue with the medications although at a lower dose. She's not interested in switching over to  Ofev at this point in time. In the past she's not being in her about participating in the Pulm fibrosis foundation support group or research trials. She's not lost weight. Not yet had transplant discussion in detail. She is asking about alpha one replacement therapy because of the decline in lung function and also recurrent exacerbations with the antibiotic courses since last visit.    OV 10/10/2016  MR Chief Complaint  Patient presents with   Follow-up    Pt states overall her breathing has slightly improved since last OV. Pt states she has prod cough with yellow. Pt c/o midsternal CP 20 min after 2nd or 3rd dose of Esbriet.   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 This was  also personally visualized. The main issues that were 3+3 times daily of Pirfenidone (Esbriet) she's having significant GI side effects of fatigue, nausea and bloated sensation and even chest pain after her dose. She's tried ginger she's had between meals. She has pacingg doses for several hours. All and communication with the help desk - but no improvement. She rates his symptoms is severe. Nevertheless because of the severity of pulmonary fibrosis and she is willing to continue with the medications although at a lower dose. She's not interested in switching over to  Ofev at this point in time. In the past she's not being in her about participating in the Pulm fibrosis foundation support group or research trials. She's not lost weight. Not yet had transplant discussion in detail. She is asking about alpha one replacement therapy because of the decline in lung function and also recurrent exacerbations with the antibiotic courses since last visit.  rec    - start prolastin replacement for MZ phenotype with decline lung function and repeated flare up> denied by insurance  - continue esbriet but reduce dose to to 2 pill three times daily - take with food and ginger    11/08/2016 acute extended ov/[facility] re:  GOLD 0 copd/ MZ quit smoking 2013 / underlying ILD on perfenidone  Chief Complaint  Patient presents with   Acute Visit    coughing so much it burns, used nebulizer to help, congestion with clear/yellow mucus, headache  new cough that caused vomiting late 90s  zpak did not work/ doxy/pred  Worked last  flare and lasted  4-5 weeks then acutely worse x one day prior to OV with severe coughing fits and midline cp brought on by coughing some better with neb/ ? spiriva dpi making it worse   No obvious day to day or daytime variability or assoc   mucus plugs or hemoptysis or cp or chest tightness, subjective wheeze or overt sinus or hb symptoms. No unusual exp hx or h/o childhood pna/ asthma or knowledge of  premature birth.  Sleeping ok without nocturnal  or early am exacerbation  of respiratory  c/o's or need for noct saba. Also denies any obvious fluctuation of symptoms with weather or environmental changes or other aggravating or alleviating factors except as outlined above    OV 12/02/2016  Chief Complaint  Patient presents with   Follow-up    Pt is here for 6 wk f/u. Pt fibrosis has remained unchanged, but has a question about the esbriet and prolastin    Follow-up idiopathic pulmonary fibrosis Emphysema MZ phenotype  Overall stable since last visit. Alpha-1 replacement has been approved but because of her  Ex-husband's death she has more money now and therefore her co-pay for both Prolastin and esbriet will increase. She's been worked [employer] to keep the co-pay cost down. If she does not succeed with this she will give up on both drugs. Prolastin has not been started yet. No new issues.   OV 06/08/2017  Chief Complaint  Patient presents with   Follow-up    Pt states her breathing is unchanged since last OV. Pt c/o dry cough. Pt denies CP/tightness and f/c/s.      .Follow-up idiopathic pulmonary fibrosis with associated emphysema MZ phenotype with low levels of 75 mg/dL  She is here for routine follow-up. Last visit January 2018. Since then she's reporting progressive worsening of cough and shortness of breath particularly with significant exertion such as climbing stairs or walking fast. In terms of IPF she is not interested in Pirfenidone [facility]) because of side effects that she had. She gave this up late last year early this year. She is also not interested in Ofev because of the diarrhea side effects. She prefers to be on basic supportive care and she is interested in pulmonary IPF research trials. In terms of emphysema MZ phenotype she is very interested in alpha-1 replacement therapy. The last 6 months. Been getting insurance denial for the same. She showed me that Medicare  part D criteria which is based on low levels, progression and also low FEV1. She will not be a candidate for low FEV1 . Results for  because of mixed emphysema and fibrosis. I will need to do a special appealed for this. Otherwise no other changes in health status.     OV 09/13/2017  Chief Complaint  Patient presents with   Follow-up    PFT done today, breathing better on prolastin    Follow-up idiopathic pulmonary fibrosis on basic supportive care because she does not want to do either fibrotic Follow-up associated emphysema MZ phenotype with low levels - on Prolastin therapy  She is following up. She again continues to refuse anti-fibrotic therapy. She is now on Prolastin and has had 9 infusions. She is tolerating this well. She tells me that overall she feels the lungs a stronger Her cough quality is better able to bring her better sputum. She feels that she is less at risk for infections. He just makes her feel good. In fact she feels confident that she can  tolerate a flu shot and is willing to have at this time.however pulmonary function test continues to show decline as documented below    OV 07/05/2018  Chief Complaint  Patient presents with   Follow-up    Pt had a 55mw performed today.  Pt states she has been having some problems with the heat and breathing in dirt from where they have been doing a lot of construction. States she has been having an occ. cough with yellow mucus and states when it rains she will have some pain in her left lung.     Follow-up idiopathic pulmonary fibrosis on basic supportive care because she does not want to do either fibrotic Follow-up associated emphysema MZ phenotype with low levels - on Prolastin therapy   Angela [name] - returns for follow-up of the above. She continues to do well. Minimally symptomatic. Walking desaturation test 6 minute walk test did not desaturate. I was very concerned in October 2018 that despite Prolastin her lung  function was declining as documented below but since then she's not had any exacerbations except one in spring 2019And April 2019 Allred function test showed stability. She certainly feels she is stable. She does not want any antibiotics. She's not interested in IPF research trials     OV 08/29/2019  Subjective:  Patient ID: Angela [name], female , DOB: [birth year] , age 53 y.o. , MRN: [id number] , ADDRESS: [address]  [address] [place] [address]   08/29/2019 -   Chief Complaint  Patient presents with   Follow-up    Pt c/o SOB, headache, prod cough with yellow mucus, chest burning x 1 month. Pt denies f/c/s. Pt states she was given a zpak by her PCP and it help breifly but now s/s are back like before. Pt also states she has noticed lately the spiriva doesnt seem to be helping.      Follow-up idiopathic pulmonary fibrosis on basic supportive care because she does not want to do either fibrotic Follow-up associated emphysema MZ phenotype with low levels - on Prolastin therapy    HPI Angela [name] 75 y.o. -telephone visit.  The risk benefits and limitations of telephone visit explained.  I personally not seen the patient in over a year.  She says she is continuing to get Prolastin for her alpha-1 antitrypsin and is continuing to do Spiriva.  In August 2020 she ended up in the ER with worsening cough.  This was at [place] [facility].  She had a chest x-ray that was clear and was sent home on prednisone.  She did not have any blood work or COVID testing or antibiotics.  Since then she is having yellow sputum worsening cough worsening shortness of breath compared to her baseline but is not progressively worse.  She just has a new lower baseline.  She was frustrated by the ER visit.  There are no other complaints.  Is no fever or chills.  No known covert exposures.  COVID risk activity is low.  She is social distancing quite well.   Last CT scan of the chest was in 2018 with the pulmonary  fibrosis.  Last pulmonary function test was April 2019 which already showed a progressive pattern.  She refused anti-fibrotic's for her ILD.     IMPRESSION: 1. There continues to be a spectrum of findings indicative of interstitial lung disease with minimal progression compared to the prior study. The overall appearance is considered a "probable UIP CT pattern." 2. Diffuse  bronchial wall thickening with moderate centrilobular and paraseptal emphysema; imaging findings suggestive of underlying COPD. 3. Aortic atherosclerosis, in addition to left main and 2 vessel coronary artery disease. Please note that although the presence of coronary artery calcium documents the presence of coronary artery disease, the severity of this disease and any potential stenosis cannot be assessed on this non-gated CT examination. Assessment for potential risk factor modification, dietary therapy or pharmacologic therapy may be warranted, if clinically indicated. 4. Mild hepatic steatosis.   Aortic Atherosclerosis (ICD10-I70.0) and Emphysema (ICD10-J43.9).     Electronically Signed   By: Vinnie Langton M.D.   On: 06/22/2017 17:00 ROS - per HPI    OV 06/23/2021  Subjective:  Patient ID: Angela [name], female , DOB: 07/22/1946 , age 31 y.o. , MRN: [id number] , ADDRESS: [address]  [address] [place] [address] PCP Maurice Small, MD Patient Care Team: Maurice Small, MD as PCP - General (Family Medicine)  This Provider for this visit: Treatment Team:  Attending Provider: Brand Males, MD    06/23/2021 -   Chief Complaint  Patient presents with   Follow-up    Pt states she has been doing okay since last visit. States she had it rough after her last covid vaccine but is doing fine now.  Follow-up idiopathic pulmonary fibrosis on basic supportive care because she does not want to do either fibrotic Follow-up associated emphysema MZ phenotype with low levels - on Prolastin therapy Follow-up  combined emphysema alpha-1 with pulmonary fibrosis IPF  HPI Angela [name] 75 y.o. -returns for follow-up.  I personally saw her in 2020.  After that she has had 2 visits with nurse practitioner 2021 and again 2 visits with nurse practitioner 2022.  She has had COVID 2 times.  She got monoclonal antibody for 1 of this.  The first 1 according to history was February 2021.  The second 1 was July 2022.  She did not take any Paxil with.  She feels the prolapse since helping her significantly.  1 time she ran out of Prolastin because of insurance coverage issues and she felt symptomatic.  She is also taking Stiolto which she says is helping her significantly.  She is applied for financial assistance with with the company and she is waiting to hear.  Till then she wants some samples to tide over.  Of note she has incisional hernia in the previous ventral hernia repair.  She is slowly getting worse.  She has missed surgical appointments because of COVID.  She wants to reestablish with surgery.  She wants me to make a referral.  From a respiratory standpoint she is stable.  The hernia does get worse with a cough.  She is willing to get restaged.  She says once with the COVID most recently in July sputum is a change in quality and color.  Therefore she is interested in imaging.    HRCT 09/23/2019   IMPRESSION: 1. The appearance of the lungs is compatible with interstitial lung disease, with a spectrum of findings which is considered probable usual interstitial pneumonia (UIP) per current ATS guidelines. Overall, findings appear stable compared to the prior study from 2018. 2. In addition, there is diffuse bronchial wall thickening with moderate to severe centrilobular and paraseptal emphysema; imaging findings suggestive of underlying COPD. 3. Aortic atherosclerosis, in addition to left main and 2 vessel coronary artery disease. Assessment for potential risk factor modification, dietary therapy or  pharmacologic therapy may be warranted, if  clinically indicated. 4. Hepatic steatosis.   Aortic Atherosclerosis (ICD10-I70.0) and Emphysema (ICD10-J43.9).     Electronically Signed   By: Vinnie Langton M.D.   On: 09/23/2019 14:53   PFT  PFT Results Latest Ref Rng & Units 02/14/2018 09/13/2017 06/08/2017 08/16/2016 03/04/2016 08/28/2015  FVC-Pre L 2.44 2.45 2.63 2.69 2.79 2.81  FVC-Predicted Pre % 70 70 75 76 79 79  FVC-Post L - - - - 2.84 2.75  FVC-Predicted Post % - - - - 81 77  Pre FEV1/FVC % % 78 77 75 79 75 75  Post FEV1/FCV % % - - - - 77 79  FEV1-Pre L 1.89 1.90 1.97 2.11 2.10 2.10  FEV1-Predicted Pre % 72 72 74 79 78 77  FEV1-Post L - - - - 2.20 2.16  DLCO uncorrected ml/min/mmHg 12.16 12.31 13.31 14.10 14.76 15.56  DLCO UNC% % 41 42 45 48 50 53  DLCO corrected ml/min/mmHg - 12.35 13.43 13.90 15.20 -  DLCO COR %Predicted % - 42 46 47 52 -  DLVA Predicted % 64 68 63 61 66 70  TLC L - - - - 4.85 4.11  TLC % Predicted % - - - - 86 73  RV % Predicted % - - - - 84 59       has a past medical history of Allergy, Alpha 1-antitrypsin PiMS phenotype, Anemia (2014), Anxiety, Arthritis, Asthma, Blood transfusion without reported diagnosis, Cataract, Chronic kidney disease, Complication of anesthesia, COPD (chronic obstructive pulmonary disease) ([place]), DDD (degenerative disc disease), Depression, Diabetes mellitus, Diverticulitis, Diverticulosis, Esophageal dysmotility (03/01/06), Fibromyalgia, GERD (gastroesophageal reflux disease), Glaucoma, Heart murmur, Hypercholesterolemia, Hypertension, IPF (idiopathic pulmonary fibrosis) ([place]), Shingles, Shortness of breath dyspnea, Sleep apnea, and Stroke ([place]) (2010).   reports that she quit smoking about 9 years ago. Her smoking use included cigarettes. She started smoking about 55 years ago. She has a 22.00 pack-year smoking history. She has never used smokeless tobacco.  Past Surgical History:  Procedure Laterality Date   ABDOMINAL  HYSTERECTOMY     ANTERIOR FUSION CERVICAL SPINE     APPENDECTOMY     BACK SURGERY     x4   CARPAL TUNNEL RELEASE Bilateral    COLONOSCOPY     DE QUERVAIN'S RELEASE     right   GASTRIC FUNDOPLICATION     HERNIA REPAIR     KNEE ARTHROSCOPY     left    LUNG BIOPSY Left 03/23/2016   Procedure: LUNG BIOPSY;  Surgeon: Melrose Nakayama, MD;  Location: [facility];  Service: Thoracic;  Laterality: Left;   LYMPH NODE BIOPSY Left 03/23/2016   Procedure: LYMPH NODE BIOPSY;  Surgeon: Melrose Nakayama, MD;  Location: [facility];  Service: Thoracic;  Laterality: Left;   POSTERIOR CERVICAL FUSION/FORAMINOTOMY N/A 03/16/2015   Procedure: POSTERIOR CERVICAL LAMINECTOMY AND DISKECTOMOY AT CERVICAL SEVEN-THORACIC ONE ;POSTERIOR CERVICAL FUSION CERVICAL SIX TO THORACIC ONE;  Surgeon: Kary Kos, MD;  Location: [facility];  Service: Neurosurgery;  Laterality: N/A;  Site is Cervical/Thoracic   TONSILLECTOMY     UPPER GASTROINTESTINAL ENDOSCOPY     VIDEO ASSISTED THORACOSCOPY Left 03/23/2016   Procedure: VIDEO ASSISTED THORACOSCOPY;  Surgeon: Melrose Nakayama, MD;  Location: [facility];  Service: Thoracic;  Laterality: Left;    Allergies  Allergen Reactions   Tizanidine Other (See Comments)    Severe decrease in BP    Influenza Vaccines     Other reaction(s): becomes sick with the flu   Other  Other reaction(s): Hives / hospitalization   Vicodin Hp [Hydrocodone-Acetaminophen]     Other reaction(s): nausea/vomiting   Betadine [Povidone Iodine] Rash   Ciprofloxacin Rash   Codeine Nausea And Vomiting   Iohexol      Desc: Pt states several years ago during a CT scan w/ iv cotnrast she had severe nausea and vomiting, sob w/ difficulty breathing and swallowing.  She had full 13hr. premeds today (5/14/7) and did fine w/o complications.     Immunization History  Administered Date(s) Administered   Influenza, High Dose Seasonal PF 09/13/2017, 08/14/2020   Moderna Sars-Covid-2 Vaccination 12/16/2019,  01/13/2020, 09/22/2020, 06/09/2021   Pneumococcal Conjugate-13 10/15/2014   Pneumococcal-Unspecified 11/14/2012   Tdap 09/05/2016    Family History  Problem Relation Age of Onset   Cancer Mother        brain   Lung cancer Mother    COPD Father    Stomach cancer Maternal Grandfather    Colon cancer Maternal Aunt    Esophageal cancer Neg Hx    Rectal cancer Neg Hx      Current Outpatient Medications:    acetaminophen (TYLENOL) 325 MG tablet, Take 650 mg by mouth every 6 (six) hours as needed (joint pain/swelling)., Disp: , Rfl:    albuterol (PROVENTIL) (2.5 MG/3ML) 0.083% nebulizer solution, Take 3 mLs (2.5 mg total) by nebulization every 6 (six) hours as needed for wheezing or shortness of breath., Disp: 90 mL, Rfl: 5   atorvastatin (LIPITOR) 40 MG tablet, Take 1 tablet by mouth daily., Disp: , Rfl: 11   brimonidine (ALPHAGAN) 0.15 % ophthalmic solution, Place 1 drop into both eyes 3 (three) times daily., Disp: , Rfl: 3   buPROPion (WELLBUTRIN XL) 300 MG 24 hr tablet, Take 1 tablet by mouth every morning., Disp: , Rfl:    cyclobenzaprine (FLEXERIL) 10 MG tablet, Take 10 mg by mouth at bedtime as needed for muscle spasms. , Disp: , Rfl: 0   glipiZIDE (GLUCOTROL XL) 5 MG 24 hr tablet, TK 1 T PO QD, Disp: , Rfl: 11   hydrochlorothiazide (MICROZIDE) 12.5 MG capsule, Take 1 capsule (12.5 mg total) by mouth daily., Disp: 90 capsule, Rfl: 1   HYDROcodone-acetaminophen (NORCO/VICODIN) 5-325 MG tablet, Take 1 tablet by mouth 2 (two) times daily as needed., Disp: , Rfl:    latanoprost (XALATAN) 0.005 % ophthalmic solution, 1 drop at bedtime. Left eye, Disp: , Rfl:    losartan (COZAAR) 50 MG tablet, Take 50 mg by mouth daily., Disp: , Rfl:    meclizine (ANTIVERT) 12.5 MG tablet, Take 1 tablet (12.5 mg total) by mouth 3 (three) times daily as needed for dizziness., Disp: 16 tablet, Rfl: 0   Menthol, Topical Analgesic, 4 % GEL, Apply 1 application topically 3 (three) times daily as needed (for back  pain)., Disp: , Rfl:    metFORMIN (GLUCOPHAGE-XR) 500 MG 24 hr tablet, TK 2 TS PO BID, Disp: , Rfl: 11   metoprolol succinate (TOPROL-XL) 25 MG 24 hr tablet, TAKE 1 TABLET(25 MG) BY MOUTH DAILY, Disp: 90 tablet, Rfl: 1   Multiple Vitamin (MULTIVITAMIN) capsule, Take 1 capsule by mouth daily., Disp: , Rfl:    omeprazole (PRILOSEC) 40 MG capsule, TAKE 1 CAPSULE(40 MG) BY MOUTH TWICE DAILY BEFORE LUNCH AND SUPPER, Disp: 90 capsule, Rfl: 3   ondansetron (ZOFRAN ODT) 4 MG disintegrating tablet, Take 1 tablet (4 mg total) by mouth every 8 (eight) hours as needed for nausea or vomiting., Disp: 10 tablet, Rfl: 0   PROLASTIN-C 1000  MG SOLR injection, Inject 1,000 mg into the skin once a week., Disp: , Rfl:    promethazine (PHENERGAN) 25 MG tablet, Take 1 tablet (25 mg total) by mouth every 6 (six) hours as needed for nausea or vomiting., Disp: 12 tablet, Rfl: 0   sertraline (ZOLOFT) 50 MG tablet, Take 50 mg by mouth daily., Disp: , Rfl:    Tiotropium Bromide-Olodaterol (STIOLTO RESPIMAT) 2.5-2.5 MCG/ACT AERS, Inhale 2 puffs into the lungs daily., Disp: 4 g, Rfl: 5   traMADol (ULTRAM) 50 MG tablet, Take 1-2 tablets (50-100 mg total) by mouth every 6 (six) hours as needed (mild pain)., Disp: 30 tablet, Rfl: 0      Objective:   Vitals:   06/23/21 1423  BP: 130/80  Pulse: 68  Temp: 98.4 F (36.9 C)  TempSrc: Oral  SpO2: 97%  Weight: 207 lb 3.2 oz (94 kg)  Height: '5\' 7"'$  (1.702 m)    Estimated body mass index is 32.45 kg/m as calculated from the following:   Height as of this encounter: '5\' 7"'$  (1.702 m).   Weight as of this encounter: 207 lb 3.2 oz (94 kg).  '@WEIGHTCHANGE'$ @  [employer]   06/23/21 1423  Weight: 207 lb 3.2 oz (94 kg)     Physical Exam  General: No distress. Looks well Neuro: Alert and Oriented x 3. GCS 15. Speech normal Psych: Pleasant Resp:  Barrel Chest - no.  Wheeze - no, Crackles - no, No overt respiratory distress CVS: Normal heart sounds. Murmurs - no Ext:  Stigmata of Connective Tissue Disease - no  HEENT: Normal upper airway. PEERL +. No post nasal drip        Assessment:       ICD-10-CM   1. Alpha-1-antitrypsin deficiency ([place])  E88.01     2. Pulmonary emphysema, unspecified emphysema type ([place])  J43.9     3. Pulmonary emphysema with fibrosis of lung ([place])  J43.9    J84.10     4. IPF (idiopathic pulmonary fibrosis) ([place])  J84.112     5. Ventral hernia without obstruction or gangrene  K43.9     6. Incisional hernia, without obstruction or gangrene  K43.2          Plan:     Patient Instructions  Pulmonary emphysema with fibrosis of lung ([place]) Alpha-1-antitrypsin deficiency ([place]) Pulmonary emphysema, unspecified emphysema type ([place]) IPF (idiopathic pulmonary fibrosis) ([place])   - we need to restage and see where things are - currently stable on prolastin and stiolto  - you had covid feb 2021 and July 2022  Plan - do HRCT supine and prone in 2-3 months  (last in 2020) -  Do spirometry and dlco (last in 2020)  - in 2-3 months - continue stiolto scheduled - continue prolastin scheduled  Ventral and Incisional Hernia  - seems to be getting worse  PLan  - refer [facility] in [place]  - 2-3 months but after completing above    SIGNATURE    Dr. Brand Males, M.D., F.C.C.P,  Pulmonary and Critical Care Medicine Staff Physician, [place] Director - Interstitial Lung Disease  Program  Pulmonary [place] at [place], [place], [address]  Pager: [phone], If no answer or between  15:00h - 7:00h: call [phone] Telephone: [phone]  3:07 PM 06/23/2021

## 2021-07-06 DIAGNOSIS — J449 Chronic obstructive pulmonary disease, unspecified: Secondary | ICD-10-CM | POA: Diagnosis not present

## 2021-07-06 DIAGNOSIS — E8801 Alpha-1-antitrypsin deficiency: Secondary | ICD-10-CM | POA: Diagnosis not present

## 2021-07-06 DIAGNOSIS — J84112 Idiopathic pulmonary fibrosis: Secondary | ICD-10-CM | POA: Diagnosis not present

## 2021-07-06 DIAGNOSIS — J209 Acute bronchitis, unspecified: Secondary | ICD-10-CM | POA: Diagnosis not present

## 2021-07-13 DIAGNOSIS — Z8673 Personal history of transient ischemic attack (TIA), and cerebral infarction without residual deficits: Secondary | ICD-10-CM | POA: Diagnosis not present

## 2021-07-13 DIAGNOSIS — I10 Essential (primary) hypertension: Secondary | ICD-10-CM | POA: Diagnosis not present

## 2021-07-13 DIAGNOSIS — G4733 Obstructive sleep apnea (adult) (pediatric): Secondary | ICD-10-CM | POA: Diagnosis not present

## 2021-07-16 DIAGNOSIS — Z0000 Encounter for general adult medical examination without abnormal findings: Secondary | ICD-10-CM | POA: Diagnosis not present

## 2021-07-16 DIAGNOSIS — E1122 Type 2 diabetes mellitus with diabetic chronic kidney disease: Secondary | ICD-10-CM | POA: Diagnosis not present

## 2021-07-16 DIAGNOSIS — G4733 Obstructive sleep apnea (adult) (pediatric): Secondary | ICD-10-CM | POA: Diagnosis not present

## 2021-07-16 DIAGNOSIS — Z23 Encounter for immunization: Secondary | ICD-10-CM | POA: Diagnosis not present

## 2021-07-16 DIAGNOSIS — N183 Chronic kidney disease, stage 3 unspecified: Secondary | ICD-10-CM | POA: Diagnosis not present

## 2021-07-16 DIAGNOSIS — I10 Essential (primary) hypertension: Secondary | ICD-10-CM | POA: Diagnosis not present

## 2021-07-16 DIAGNOSIS — E785 Hyperlipidemia, unspecified: Secondary | ICD-10-CM | POA: Diagnosis not present

## 2021-07-16 DIAGNOSIS — H353131 Nonexudative age-related macular degeneration, bilateral, early dry stage: Secondary | ICD-10-CM | POA: Diagnosis not present

## 2021-08-12 DIAGNOSIS — H01116 Allergic dermatitis of left eye, unspecified eyelid: Secondary | ICD-10-CM | POA: Diagnosis not present

## 2021-08-17 ENCOUNTER — Telehealth: Payer: Self-pay | Admitting: Internal Medicine

## 2021-08-17 DIAGNOSIS — K439 Ventral hernia without obstruction or gangrene: Secondary | ICD-10-CM | POA: Diagnosis not present

## 2021-08-17 DIAGNOSIS — K432 Incisional hernia without obstruction or gangrene: Secondary | ICD-10-CM

## 2021-08-17 DIAGNOSIS — K430 Incisional hernia with obstruction, without gangrene: Secondary | ICD-10-CM | POA: Diagnosis not present

## 2021-08-17 NOTE — Telephone Encounter (Signed)
Called and spoke with Maudie Mercury at [facility].  She stated that the pt had a consult with Dr. Rosendo Gros today and he is wanting the pt to have CT abd/pelvis  w/o constrast to r/o ventral hernia.  Pt is scheduled to have CT chest HR on 10/12 and [facility] stated that they can do these on the same date and time if MR will order it.    MR please advise if you are ok to order this CT as well.  We will need to let Joelene Millin know if we do order it.  Thanks

## 2021-08-18 NOTE — Telephone Encounter (Signed)
Order placed for ct abd pelvis ok per MR  Spoke with [facility] and notified order was placed  Nothing further needed

## 2021-08-18 NOTE — Telephone Encounter (Signed)
Yeah this is fine 

## 2021-08-22 DIAGNOSIS — K5732 Diverticulitis of large intestine without perforation or abscess without bleeding: Secondary | ICD-10-CM | POA: Diagnosis not present

## 2021-08-22 DIAGNOSIS — R197 Diarrhea, unspecified: Secondary | ICD-10-CM | POA: Diagnosis not present

## 2021-08-23 ENCOUNTER — Ambulatory Visit: Payer: PPO | Admitting: Internal Medicine

## 2021-08-23 ENCOUNTER — Other Ambulatory Visit: Payer: Self-pay

## 2021-08-23 ENCOUNTER — Encounter: Payer: Self-pay | Admitting: Internal Medicine

## 2021-08-23 ENCOUNTER — Ambulatory Visit (INDEPENDENT_AMBULATORY_CARE_PROVIDER_SITE_OTHER): Payer: PPO | Admitting: Internal Medicine

## 2021-08-23 VITALS — BP 132/68 | HR 64 | Temp 97.7°F | Ht 67.0 in | Wt 205.0 lb

## 2021-08-23 DIAGNOSIS — E8801 Alpha-1-antitrypsin deficiency: Secondary | ICD-10-CM | POA: Diagnosis not present

## 2021-08-23 DIAGNOSIS — J439 Emphysema, unspecified: Secondary | ICD-10-CM | POA: Diagnosis not present

## 2021-08-23 DIAGNOSIS — J84112 Idiopathic pulmonary fibrosis: Secondary | ICD-10-CM | POA: Diagnosis not present

## 2021-08-23 LAB — PULMONARY FUNCTION TEST
DL/VA % pred: 80 %
DL/VA: 3.22 ml/min/mmHg/L
DLCO cor % pred: 58 %
DLCO cor: 12.64 ml/min/mmHg
DLCO unc % pred: 58 %
DLCO unc: 12.64 ml/min/mmHg
FEF 25-75 Pre: 2.01 L/sec
FEF2575-%Pred-Pre: 102 %
FEV1-%Pred-Pre: 83 %
FEV1-Pre: 2.09 L
FEV1FVC-%Pred-Pre: 106 %
FEV6-%Pred-Pre: 81 %
FEV6-Pre: 2.6 L
FEV6FVC-%Pred-Pre: 104 %
FVC-%Pred-Pre: 78 %
FVC-Pre: 2.6 L
Pre FEV1/FVC ratio: 80 %
Pre FEV6/FVC Ratio: 100 %

## 2021-08-23 NOTE — Progress Notes (Signed)
Spirometry/DLCO performed today. 

## 2021-08-23 NOTE — Addendum Note (Signed)
Addended by: Konrad Felix L on: 08/23/2021 03:04 PM   Modules accepted: Orders

## 2021-08-23 NOTE — Progress Notes (Signed)
OV 10/10/2016  Chief Complaint  Patient presents with   Follow-up    Pt states overall her breathing has slightly improved since last OV. Pt states she has prod cough with yellow. Pt c/o midsternal CP 20 min after 2nd or 3rd dose of Esbriet.     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 This was also personally visualized. The main issues that were 3+3 times daily of Pirfenidone (Esbriet) she's having significant GI side effects of fatigue, nausea and bloated sensation and even chest pain after her dose. She's tried ginger she's had between meals. She has pacingg doses for several hours. All and communication with the help desk - but no improvement. She rates his symptoms is severe. Nevertheless because of the severity of pulmonary fibrosis and she is willing to continue with the medications although at a lower dose. She's not interested in switching over to  Ofev at this point in time. In the past she's not being in her about participating in the Pulm fibrosis foundation support group or research trials. She's not lost weight. Not yet had transplant discussion in detail. She is asking about alpha one replacement therapy because of the decline in lung function and also recurrent exacerbations with the antibiotic courses since last visit.    OV 10/10/2016  MR Chief Complaint  Patient presents with   Follow-up    Pt states overall her breathing has slightly improved since last OV. Pt states she has prod cough with yellow. Pt c/o midsternal CP 20 min after 2nd or 3rd dose of Esbriet.   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 This was  also personally visualized. The main issues that were 3+3 times daily of Pirfenidone (Esbriet) she's having significant GI side effects of fatigue, nausea and bloated sensation and even chest pain after her dose. She's tried ginger she's had between meals. She has pacingg doses for several hours. All and communication with the help desk - but no improvement. She rates his symptoms is severe. Nevertheless because of the severity of pulmonary fibrosis and she is willing to continue with the medications although at a lower dose. She's not interested in switching over to  Ofev at this point in time. In the past she's not being in her about participating in the Pulm fibrosis foundation support group or research trials. She's not lost weight. Not yet had transplant discussion in detail. She is asking about alpha one replacement therapy because of the decline in lung function and also recurrent exacerbations with the antibiotic courses since last visit.  rec    - start prolastin replacement for MZ phenotype with decline lung function and repeated flare up> denied by insurance  - continue esbriet but reduce dose to to 2 pill three times daily - take with food and ginger    11/08/2016 acute extended ov/Wert re:  GOLD 0 copd/ MZ quit smoking 2013 / underlying ILD on perfenidone  Chief Complaint  Patient presents with   Acute Visit    coughing so much it burns, used nebulizer to help, congestion with clear/yellow mucus, headache  new cough that caused vomiting late 90s  zpak did not work/ doxy/pred  Worked last  flare and lasted  4-5 weeks then acutely worse x one day prior to OV with severe coughing fits and midline cp brought on by coughing some better with neb/ ? spiriva dpi making it worse   No obvious day to day or daytime variability or assoc   mucus plugs or hemoptysis or cp or chest tightness, subjective wheeze or overt sinus or hb symptoms. No unusual exp hx or h/o childhood pna/ asthma or knowledge of  premature birth.  Sleeping ok without nocturnal  or early am exacerbation  of respiratory  c/o's or need for noct saba. Also denies any obvious fluctuation of symptoms with weather or environmental changes or other aggravating or alleviating factors except as outlined above    OV 12/02/2016  Chief Complaint  Patient presents with   Follow-up    Pt is here for 6 wk f/u. Pt fibrosis has remained unchanged, but has a question about the esbriet and prolastin    Follow-up idiopathic pulmonary fibrosis Emphysema MZ phenotype  Overall stable since last visit. Alpha-1 replacement has been approved but because of her  Ex-husband's death she has more money now and therefore her co-pay for both Prolastin and esbriet will increase. She's been worked [employer] to keep the co-pay cost down. If she does not succeed with this she will give up on both drugs. Prolastin has not been started yet. No new issues.   OV 06/08/2017  Chief Complaint  Patient presents with   Follow-up    Pt states her breathing is unchanged since last OV. Pt c/o dry cough. Pt denies CP/tightness and f/c/s.      .Follow-up idiopathic pulmonary fibrosis with associated emphysema MZ phenotype with low levels of 75 mg/dL  She is here for routine follow-up. Last visit January 2018. Since then she's reporting progressive worsening of cough and shortness of breath particularly with significant exertion such as climbing stairs or walking fast. In terms of IPF she is not interested in Pirfenidone [facility]) because of side effects that she had. She gave this up late last year early this year. She is also not interested in Ofev because of the diarrhea side effects. She prefers to be on basic supportive care and she is interested in pulmonary IPF research trials. In terms of emphysema MZ phenotype she is very interested in alpha-1 replacement therapy. The last 6 months. Been getting insurance denial for the same. She showed me that Medicare  part D criteria which is based on low levels, progression and also low FEV1. She will not be a candidate for low FEV1 . Results for  because of mixed emphysema and fibrosis. I will need to do a special appealed for this. Otherwise no other changes in health status.     OV 09/13/2017  Chief Complaint  Patient presents with   Follow-up    PFT done today, breathing better on prolastin    Follow-up idiopathic pulmonary fibrosis on basic supportive care because she does not want to do either fibrotic Follow-up associated emphysema MZ phenotype with low levels - on Prolastin therapy  She is following up. She again continues to refuse anti-fibrotic therapy. She is now on Prolastin and has had 9 infusions. She is tolerating this well. She tells me that overall she feels the lungs a stronger Her cough quality is better able to bring her better sputum. She feels that she is less at risk for infections. He just makes her feel good. In fact she feels confident that she can  tolerate a flu shot and is willing to have at this time.however pulmonary function test continues to show decline as documented below    OV 07/05/2018  Chief Complaint  Patient presents with   Follow-up    Pt had a 55mw performed today.  Pt states she has been having some problems with the heat and breathing in dirt from where they have been doing a lot of construction. States she has been having an occ. cough with yellow mucus and states when it rains she will have some pain in her left lung.     Follow-up idiopathic pulmonary fibrosis on basic supportive care because she does not want to do either fibrotic Follow-up associated emphysema MZ phenotype with low levels - on Prolastin therapy   Angela [name] - returns for follow-up of the above. She continues to do well. Minimally symptomatic. Walking desaturation test 6 minute walk test did not desaturate. I was very concerned in October 2018 that despite Prolastin her lung  function was declining as documented below but since then she's not had any exacerbations except one in spring 2019And April 2019 Allred function test showed stability. She certainly feels she is stable. She does not want any antibiotics. She's not interested in IPF research trials     OV 08/29/2019  Subjective:  Patient ID: Angela [name], female , DOB: [birth year] , age 53 y.o. , MRN: [id number] , ADDRESS: [address]  [address] [place] [address]   08/29/2019 -   Chief Complaint  Patient presents with   Follow-up    Pt c/o SOB, headache, prod cough with yellow mucus, chest burning x 1 month. Pt denies f/c/s. Pt states she was given a zpak by her PCP and it help breifly but now s/s are back like before. Pt also states she has noticed lately the spiriva doesnt seem to be helping.      Follow-up idiopathic pulmonary fibrosis on basic supportive care because she does not want to do either fibrotic Follow-up associated emphysema MZ phenotype with low levels - on Prolastin therapy    HPI Angela [name] 75 y.o. -telephone visit.  The risk benefits and limitations of telephone visit explained.  I personally not seen the patient in over a year.  She says she is continuing to get Prolastin for her alpha-1 antitrypsin and is continuing to do Spiriva.  In August 2020 she ended up in the ER with worsening cough.  This was at [place] [facility].  She had a chest x-ray that was clear and was sent home on prednisone.  She did not have any blood work or COVID testing or antibiotics.  Since then she is having yellow sputum worsening cough worsening shortness of breath compared to her baseline but is not progressively worse.  She just has a new lower baseline.  She was frustrated by the ER visit.  There are no other complaints.  Is no fever or chills.  No known covert exposures.  COVID risk activity is low.  She is social distancing quite well.   Last CT scan of the chest was in 2018 with the pulmonary  fibrosis.  Last pulmonary function test was April 2019 which already showed a progressive pattern.  She refused anti-fibrotic's for her ILD.     IMPRESSION: 1. There continues to be a spectrum of findings indicative of interstitial lung disease with minimal progression compared to the prior study. The overall appearance is considered a "probable UIP CT pattern." 2. Diffuse  bronchial wall thickening with moderate centrilobular and paraseptal emphysema; imaging findings suggestive of underlying COPD. 3. Aortic atherosclerosis, in addition to left main and 2 vessel coronary artery disease. Please note that although the presence of coronary artery calcium documents the presence of coronary artery disease, the severity of this disease and any potential stenosis cannot be assessed on this non-gated CT examination. Assessment for potential risk factor modification, dietary therapy or pharmacologic therapy may be warranted, if clinically indicated. 4. Mild hepatic steatosis.   Aortic Atherosclerosis (ICD10-I70.0) and Emphysema (ICD10-J43.9).     Electronically Signed   By: Vinnie Langton M.D.   On: 06/22/2017 17:00 ROS - per HPI    OV 06/23/2021  Subjective:  Patient ID: Angela [name], female , DOB: 07/22/1946 , age 31 y.o. , MRN: [id number] , ADDRESS: [address]  [address] [place] [address] PCP Maurice Small, MD Patient Care Team: Maurice Small, MD as PCP - General (Family Medicine)  This Provider for this visit: Treatment Team:  Attending Provider: Brand Males, MD    06/23/2021 -   Chief Complaint  Patient presents with   Follow-up    Pt states she has been doing okay since last visit. States she had it rough after her last covid vaccine but is doing fine now.  Follow-up idiopathic pulmonary fibrosis on basic supportive care because she does not want to do either fibrotic Follow-up associated emphysema MZ phenotype with low levels - on Prolastin therapy Follow-up  combined emphysema alpha-1 with pulmonary fibrosis IPF  HPI Angela [name] 75 y.o. -returns for follow-up.  I personally saw her in 2020.  After that she has had 2 visits with nurse practitioner 2021 and again 2 visits with nurse practitioner 2022.  She has had COVID 2 times.  She got monoclonal antibody for 1 of this.  The first 1 according to history was February 2021.  The second 1 was July 2022.  She did not take any Paxil with.  She feels the prolapse since helping her significantly.  1 time she ran out of Prolastin because of insurance coverage issues and she felt symptomatic.  She is also taking Stiolto which she says is helping her significantly.  She is applied for financial assistance with with the company and she is waiting to hear.  Till then she wants some samples to tide over.  Of note she has incisional hernia in the previous ventral hernia repair.  She is slowly getting worse.  She has missed surgical appointments because of COVID.  She wants to reestablish with surgery.  She wants me to make a referral.  From a respiratory standpoint she is stable.  The hernia does get worse with a cough.  She is willing to get restaged.  She says once with the COVID most recently in July sputum is a change in quality and color.  Therefore she is interested in imaging.    HRCT 09/23/2019   IMPRESSION: 1. The appearance of the lungs is compatible with interstitial lung disease, with a spectrum of findings which is considered probable usual interstitial pneumonia (UIP) per current ATS guidelines. Overall, findings appear stable compared to the prior study from 2018. 2. In addition, there is diffuse bronchial wall thickening with moderate to severe centrilobular and paraseptal emphysema; imaging findings suggestive of underlying COPD. 3. Aortic atherosclerosis, in addition to left main and 2 vessel coronary artery disease. Assessment for potential risk factor modification, dietary therapy or  pharmacologic therapy may be warranted, if  clinically indicated. 4. Hepatic steatosis.   Aortic Atherosclerosis (ICD10-I70.0) and Emphysema (ICD10-J43.9).     Electronically Signed   By: Vinnie Langton M.D.   On: 09/23/2019 14:53   OV 08/23/2021  Subjective:  Patient ID: Angela [name], female , DOB: Dec 23, 1945 , age 66 y.o. , MRN: [id number] , ADDRESS: [address] [place] [id number] PCP Maurice Small, MD (Inactive) Patient Care Team: Maurice Small, MD (Inactive) as PCP - General (Family Medicine)  This Provider for this visit: Treatment Team:  Attending Provider: Brand Males, MD    08/23/2021 -   Chief Complaint  Patient presents with   Follow-up    PFT done today. Inhalers working.   Follow-up idiopathic pulmonary fibrosis on basic supportive care because she does not want to do either antifibrotic Follow-up associated emphysema MZ phenotype with low levels - on Prolastin therapy Follow-up combined emphysema alpha-1 with pulmonary fibrosis IPF  HPI Angela [name] 75 y.o. -returns for follow-up.  At the last visit we referred her to [facility] surgery because of ventral hernia.  She has seen Dr. Rosendo Gros CT scan has been ordered.  She is going to have this in the next day or 2.  This is going to be with the CT chest.  She might not have surgery because there is concern for ideations according to history.  She continues [employer].  She had pulmonary function test today that shows an improvement and return back to 2018 levels but it still was in 2016.  She is pleased by this report.  Discussed because she feels Stiolto is helping her significantly.  She is not interested in flu vaccine or COVID-vaccine.  She is not interested in antifibrotic.  She feels she is very sensitive to medications.    CT Chest data  No results found.    PFT  PFT Results Latest Ref Rng & Units 08/23/2021 02/14/2018 09/13/2017 06/08/2017 08/16/2016 03/04/2016  08/28/2015  FVC-Pre L 2.60 2.44 2.45 2.63 2.69 2.79 2.81  FVC-Predicted Pre % 78 70 70 75 76 79 79  FVC-Post L - - - - - 2.84 2.75  FVC-Predicted Post % - - - - - 81 77  Pre FEV1/FVC % % 80 78 77 75 79 75 75  Post FEV1/FCV % % - - - - - 77 79  FEV1-Pre L 2.09 1.89 1.90 1.97 2.11 2.10 2.10  FEV1-Predicted Pre % 83 72 72 74 79 78 77  FEV1-Post L - - - - - 2.20 2.16  DLCO uncorrected ml/min/mmHg 12.64 12.16 12.31 13.31 14.10 14.76 15.56  DLCO UNC% % 58 41 42 45 48 50 53  DLCO corrected ml/min/mmHg 12.64 - 12.35 13.43 13.90 15.20 -  DLCO COR %Predicted % 58 - 42 46 47 52 -  DLVA Predicted % 80 64 68 63 61 66 70  TLC L - - - - - 4.85 4.11  TLC % Predicted % - - - - - 86 73  RV % Predicted % - - - - - 84 59       has a past medical history of Allergy, Alpha 1-antitrypsin PiMS phenotype, Anemia (2014), Anxiety, Arthritis, Asthma, Blood transfusion without reported diagnosis, Cataract, Chronic kidney disease, Complication of anesthesia, COPD (chronic obstructive pulmonary disease) ([place]), DDD (degenerative disc disease), Depression, Diabetes mellitus, Diverticulitis, Diverticulosis, Esophageal dysmotility (03/01/06), Fibromyalgia, GERD (gastroesophageal reflux disease), Glaucoma, Heart murmur, Hypercholesterolemia, Hypertension, IPF (idiopathic pulmonary fibrosis) ([place]), Shingles, Shortness of breath dyspnea, Sleep apnea, and Stroke ([place]) (  2010).   reports that she quit smoking about 9 years ago. Her smoking use included cigarettes. She started smoking about 55 years ago. She has a 22.00 pack-year smoking history. She has never used smokeless tobacco.  Past Surgical History:  Procedure Laterality Date   ABDOMINAL HYSTERECTOMY     ANTERIOR FUSION CERVICAL SPINE     APPENDECTOMY     BACK SURGERY     x4   CARPAL TUNNEL RELEASE Bilateral    COLONOSCOPY     DE QUERVAIN'S RELEASE     right   GASTRIC FUNDOPLICATION     HERNIA REPAIR     KNEE ARTHROSCOPY     left    LUNG BIOPSY Left 03/23/2016    Procedure: LUNG BIOPSY;  Surgeon: Melrose Nakayama, MD;  Location: [place];  Service: Thoracic;  Laterality: Left;   LYMPH NODE BIOPSY Left 03/23/2016   Procedure: LYMPH NODE BIOPSY;  Surgeon: Melrose Nakayama, MD;  Location: [facility];  Service: Thoracic;  Laterality: Left;   POSTERIOR CERVICAL FUSION/FORAMINOTOMY N/A 03/16/2015   Procedure: POSTERIOR CERVICAL LAMINECTOMY AND DISKECTOMOY AT CERVICAL SEVEN-THORACIC ONE ;POSTERIOR CERVICAL FUSION CERVICAL SIX TO THORACIC ONE;  Surgeon: Kary Kos, MD;  Location: [facility];  Service: Neurosurgery;  Laterality: N/A;  Site is Cervical/Thoracic   TONSILLECTOMY     UPPER GASTROINTESTINAL ENDOSCOPY     VIDEO ASSISTED THORACOSCOPY Left 03/23/2016   Procedure: VIDEO ASSISTED THORACOSCOPY;  Surgeon: Melrose Nakayama, MD;  Location: [facility];  Service: Thoracic;  Laterality: Left;    Allergies  Allergen Reactions   Tizanidine Other (See Comments)    Severe decrease in BP    Influenza Vaccines     Other reaction(s): becomes sick with the flu   Other     Other reaction(s): Hives / hospitalization   Vicodin Hp [Hydrocodone-Acetaminophen]     Other reaction(s): nausea/vomiting   Betadine [Povidone Iodine] Rash   Ciprofloxacin Rash   Codeine Nausea And Vomiting   Iohexol      Desc: Pt states several years ago during a CT scan w/ iv cotnrast she had severe nausea and vomiting, sob w/ difficulty breathing and swallowing.  She had full 13hr. premeds today (5/14/7) and did fine w/o complications.     Immunization History  Administered Date(s) Administered   Influenza, High Dose Seasonal PF 09/13/2017, 08/14/2020   Moderna Sars-Covid-2 Vaccination 12/16/2019, 01/13/2020, 09/22/2020, 06/09/2021   Pneumococcal Conjugate-13 10/15/2014   Pneumococcal-Unspecified 11/14/2012   Tdap 09/05/2016    Family History  Problem Relation Age of Onset   Cancer Mother        brain   Lung cancer Mother    COPD Father    Stomach cancer Maternal Grandfather     Colon cancer Maternal Aunt    Esophageal cancer Neg Hx    Rectal cancer Neg Hx      Current Outpatient Medications:    acetaminophen (TYLENOL) 325 MG tablet, Take 650 mg by mouth every 6 (six) hours as needed (joint pain/swelling)., Disp: , Rfl:    albuterol (PROVENTIL) (2.5 MG/3ML) 0.083% nebulizer solution, Take 3 mLs (2.5 mg total) by nebulization every 6 (six) hours as needed for wheezing or shortness of breath., Disp: 90 mL, Rfl: 5   atorvastatin (LIPITOR) 40 MG tablet, Take 1 tablet by mouth daily., Disp: , Rfl: 11   brimonidine (ALPHAGAN) 0.15 % ophthalmic solution, Place 1 drop into both eyes 3 (three) times daily., Disp: , Rfl: 3   buPROPion (WELLBUTRIN XL) 300 MG  24 hr tablet, Take 1 tablet by mouth every morning., Disp: , Rfl:    cyclobenzaprine (FLEXERIL) 10 MG tablet, Take 10 mg by mouth at bedtime as needed for muscle spasms. , Disp: , Rfl: 0   glipiZIDE (GLUCOTROL XL) 5 MG 24 hr tablet, TK 1 T PO QD, Disp: , Rfl: 11   hydrochlorothiazide (MICROZIDE) 12.5 MG capsule, Take 1 capsule (12.5 mg total) by mouth daily., Disp: 90 capsule, Rfl: 1   HYDROcodone-acetaminophen (NORCO/VICODIN) 5-325 MG tablet, Take 1 tablet by mouth 2 (two) times daily as needed., Disp: , Rfl:    latanoprost (XALATAN) 0.005 % ophthalmic solution, 1 drop at bedtime. Left eye, Disp: , Rfl:    losartan (COZAAR) 50 MG tablet, Take 50 mg by mouth daily., Disp: , Rfl:    meclizine (ANTIVERT) 12.5 MG tablet, Take 1 tablet (12.5 mg total) by mouth 3 (three) times daily as needed for dizziness., Disp: 16 tablet, Rfl: 0   Menthol, Topical Analgesic, 4 % GEL, Apply 1 application topically 3 (three) times daily as needed (for back pain)., Disp: , Rfl:    metFORMIN (GLUCOPHAGE-XR) 500 MG 24 hr tablet, TK 2 TS PO BID, Disp: , Rfl: 11   metoprolol succinate (TOPROL-XL) 25 MG 24 hr tablet, TAKE 1 TABLET(25 MG) BY MOUTH DAILY, Disp: 90 tablet, Rfl: 1   Multiple Vitamin (MULTIVITAMIN) capsule, Take 1 capsule by mouth  daily., Disp: , Rfl:    omeprazole (PRILOSEC) 40 MG capsule, TAKE 1 CAPSULE(40 MG) BY MOUTH TWICE DAILY BEFORE LUNCH AND SUPPER, Disp: 90 capsule, Rfl: 3   ondansetron (ZOFRAN ODT) 4 MG disintegrating tablet, Take 1 tablet (4 mg total) by mouth every 8 (eight) hours as needed for nausea or vomiting., Disp: 10 tablet, Rfl: 0   PROLASTIN-C 1000 MG SOLR injection, Inject 1,000 mg into the skin once a week., Disp: , Rfl:    promethazine (PHENERGAN) 25 MG tablet, Take 1 tablet (25 mg total) by mouth every 6 (six) hours as needed for nausea or vomiting., Disp: 12 tablet, Rfl: 0   sertraline (ZOLOFT) 50 MG tablet, Take 50 mg by mouth daily., Disp: , Rfl:    Tiotropium Bromide-Olodaterol (STIOLTO RESPIMAT) 2.5-2.5 MCG/ACT AERS, Inhale 2 puffs into the lungs daily., Disp: 4 g, Rfl: 5   Tiotropium Bromide-Olodaterol (STIOLTO RESPIMAT) 2.5-2.5 MCG/ACT AERS, Inhale 2 puffs into the lungs daily., Disp: 4 g, Rfl: 0   traMADol (ULTRAM) 50 MG tablet, Take 1-2 tablets (50-100 mg total) by mouth every 6 (six) hours as needed (mild pain)., Disp: 30 tablet, Rfl: 0      Objective:   Vitals:   08/23/21 1433  BP: 132/68  Pulse: 64  Temp: 97.7 F (36.5 C)  TempSrc: Oral  SpO2: 95%  Weight: 205 lb (93 kg)  Height: 5\' 7"  (1.702 m)    Estimated body mass index is 32.11 kg/m as calculated from the following:   Height as of this encounter: 5\' 7"  (1.702 m).   Weight as of this encounter: 205 lb (93 kg).  @WEIGHTCHANGE @  [employer]   08/23/21 1433  Weight: 205 lb (93 kg)     Physical Exam General: No distress. Estimated body mass index is 32.11 kg/m as calculated from the following:   Height as of this encounter: 5\' 7"  (1.702 m).   Weight as of this encounter: 205 lb (93 kg).  Neuro: Alert and Oriented x 3. GCS 15. Speech normal Psych: Pleasant Resp:  Barrel Chest - no.  Wheeze - no, Crackles -  faint, No overt respiratory distress CVS: Normal heart sounds. Murmurs - no Ext: Stigmata of Connective  Tissue Disease - no HEENT: Normal upper airway. PEERL +. No post nasal drip        Assessment:       ICD-10-CM   1. IPF (idiopathic pulmonary fibrosis) ([place])  J84.112     2. Pulmonary emphysema, unspecified emphysema type ([place])  J43.9     3. Alpha-1-antitrypsin deficiency ([place])  E88.01          Plan:     Patient Instructions  Pulmonary emphysema with fibrosis of lung ([place]) Alpha-1-antitrypsin deficiency ([place]) Pulmonary emphysema, unspecified emphysema type ([place]) IPF (idiopathic pulmonary fibrosis) ([place])  - you had covid feb 2021 and July 2022 - stiolto has helped - PFT similar to 2018 levesl and back up but still worse tha 2016   - Plan - keep HRCT upcoming appointment - continue stiolto scheduled - continue prolastin scheduled - respect flu shot deferral - cpap without o2 for your OSA  Ventral and Incisional Hernia  - glad you saw Dr Rosendo Gros  PLan  - CT scan per him and followup per him  Followup  - 9 months do spirometyr and dlco  - return in 9 months or sooner if needed    SIGNATURE    Dr. Brand Males, M.D., F.C.C.P,  Pulmonary and Critical Care Medicine Staff Physician, [place] Director - Interstitial Lung Disease  Program  [facility] [place], [place], [address]  Pager: [phone], If no answer or between  15:00h - 7:00h: call [phone] Telephone: [phone]  3:00 PM 08/23/2021

## 2021-08-23 NOTE — Patient Instructions (Addendum)
Spirometry/DLCO performed today. 

## 2021-08-23 NOTE — Patient Instructions (Addendum)
Pulmonary emphysema with fibrosis of lung (HCC) Alpha-1-antitrypsin deficiency (HCC) Pulmonary emphysema, unspecified emphysema type (HCC) IPF (idiopathic pulmonary fibrosis) (HCC)  - you had covid feb 2021 and July 2022 - stiolto has helped - PFT similar to 2018 levesl and back up but still worse tha 2016   - Plan - keep HRCT upcoming appointment - continue stiolto scheduled - continue prolastin scheduled - respect flu shot deferral - cpap without o2 for your OSA  Ventral and Incisional Hernia  - glad you saw Dr Ramirez  PLan  - CT scan per him and followup per him  Followup  - 9 months do spirometyr and dlco  - return in 9 months or sooner if needed 

## 2021-08-25 ENCOUNTER — Other Ambulatory Visit: Payer: Self-pay

## 2021-08-25 ENCOUNTER — Ambulatory Visit (INDEPENDENT_AMBULATORY_CARE_PROVIDER_SITE_OTHER)
Admission: RE | Admit: 2021-08-25 | Discharge: 2021-08-25 | Disposition: A | Discharge status: Home / Self Care | Payer: PPO | Source: Ambulatory Visit | Attending: Internal Medicine | Admitting: Internal Medicine

## 2021-08-25 DIAGNOSIS — M160 Bilateral primary osteoarthritis of hip: Secondary | ICD-10-CM | POA: Diagnosis not present

## 2021-08-25 DIAGNOSIS — J439 Emphysema, unspecified: Secondary | ICD-10-CM | POA: Diagnosis not present

## 2021-08-25 DIAGNOSIS — K5730 Diverticulosis of large intestine without perforation or abscess without bleeding: Secondary | ICD-10-CM | POA: Diagnosis not present

## 2021-08-25 DIAGNOSIS — I700 Atherosclerosis of aorta: Secondary | ICD-10-CM | POA: Diagnosis not present

## 2021-08-25 DIAGNOSIS — K429 Umbilical hernia without obstruction or gangrene: Secondary | ICD-10-CM | POA: Diagnosis not present

## 2021-08-25 DIAGNOSIS — J479 Bronchiectasis, uncomplicated: Secondary | ICD-10-CM | POA: Diagnosis not present

## 2021-08-25 DIAGNOSIS — J849 Interstitial pulmonary disease, unspecified: Secondary | ICD-10-CM | POA: Diagnosis not present

## 2021-08-25 DIAGNOSIS — K432 Incisional hernia without obstruction or gangrene: Secondary | ICD-10-CM

## 2021-09-16 ENCOUNTER — Ambulatory Visit: Payer: Self-pay | Admitting: General Surgery

## 2021-09-16 DIAGNOSIS — K432 Incisional hernia without obstruction or gangrene: Secondary | ICD-10-CM | POA: Diagnosis not present

## 2021-09-16 NOTE — H&P (Signed)
Chief Complaint: No chief complaint on file.       History of Present Illness: Angela [name] is a 75 y.o. female who is seen today as an office consultation at the request of Dr. Rosendo Gros for evaluation of No chief complaint on file. .   Patient follows back up today after CT scan. CT scan did reveal a 1.3 cm recurrent incisional hernia just at the umbilicus.  There appears to be intra-abdominal fat in the hernia.   Patient states that she continues with pain or discomfort to the area with minimal activity.   Patient would like to proceed with surgery.       Review of Systems: A complete review of systems was obtained from the patient.  I have reviewed this information and discussed as appropriate with the patient.  See HPI as well for other ROS.   Review of Systems  Constitutional: Negative for fever.  HENT: Negative for congestion.   Eyes: Negative for blurred vision.  Respiratory: Negative for cough, shortness of breath and wheezing.   Cardiovascular: Negative for chest pain and palpitations.  Gastrointestinal: Negative for heartburn.  Genitourinary: Negative for dysuria.  Musculoskeletal: Negative for myalgias.  Skin: Negative for rash.  Neurological: Negative for dizziness and headaches.  Psychiatric/Behavioral: Negative for depression and suicidal ideas.  All other systems reviewed and are negative.       Medical History: Past Medical History Past Medical History: Diagnosis Date  Anxiety    Arthritis    COPD (chronic obstructive pulmonary disease) (CMS-HCC)    Diabetes mellitus without complication (CMS-HCC)    End-stage liver disease (CMS-HCC)    GERD (gastroesophageal reflux disease)    Hypertension    Sleep apnea        There is no problem list on file for this patient.     Past Surgical History Past Surgical History: Procedure Laterality Date  APPENDECTOMY          Allergies Allergies Allergen Reactions  Tizanidine Other (See Comments)      Severe decrease in BP   Hydrocodone-Acetaminophen Other (See Comments)     Other reaction(s): nausea/vomiting  Influenza Virus Vaccines Other (See Comments)     Other reaction(s): becomes sick with the flu  Ciprofloxacin Rash  Codeine Nausea And Vomiting  Iohexol Other (See Comments)      Desc: Pt states several years ago during a CT scan w/ iv cotnrast she had severe nausea and vomiting, sob w/ difficulty breathing and swallowing.  She had full 13hr. premeds today (5/14/7) and did fine w/o complications.  Povidone-Iodine Rash      Current Outpatient Medications on File Prior to Visit Medication Sig Dispense Refill  acetaminophen (TYLENOL) 325 MG tablet Take by mouth      albuterol (PROVENTIL) 2.5 mg /3 mL (0.083 %) nebulizer solution USE 1 VIAL VIA NEBULIZER EVERY 6 HOURS AS NEEDED FOR WHEEZING OR SHORTNESS OF BREATH      azithromycin (ZITHROMAX) 250 MG tablet TK 2 TS PO ON DAY 1, THEN TK 1 T PO D FOR 4 DAYS      buPROPion (WELLBUTRIN XL) 300 MG XL tablet Take 300 mg by mouth once daily      cyclobenzaprine (FLEXERIL) 10 MG tablet TAKE 1 TABLET BY MOUTH TWICE DAILY FOR 45 DAYS AS NEEDED FOR SPASM      glipiZIDE (GLUCOTROL XL) 5 MG XL tablet Take 5 mg by mouth once daily      hydroCHLOROthiazide (HYDRODIURIL) 12.5 MG tablet Take 12.5  mg by mouth once daily      HYDROcodone-acetaminophen (NORCO) 5-325 mg tablet TAKE 1 TABLET BY MOUTH TWICE DAILY FOR 5 DAYS AS NEEDED FOR SEVERE PAIN      latanoprost (XALATAN) 0.005 % ophthalmic solution 1 drop at bedtime. Left eye      losartan (COZAAR) 50 MG tablet Take 50 mg by mouth once daily      menthol 4 % Gel Apply topically      metFORMIN (GLUCOPHAGE-XR) 500 MG XR tablet Take 1,000 mg by mouth 2 (two) times daily      metoprolol succinate (TOPROL-XL) 25 MG XL tablet Take 25 mg by mouth once daily      multivitamin capsule Take 1 capsule by mouth once daily      omeprazole (PRILOSEC) 40 MG DR capsule        predniSONE (DELTASONE) 20 MG tablet         PROLASTIN-C 1,000 mg (+/-)/20 mL Soln        promethazine (PHENERGAN) 25 MG tablet TAKE 1 TABLET BY MOUTH FOUR TIMES DAILY FOR 3 DAYS AS NEEDED FOR VOMITING      sertraline (ZOLOFT) 50 MG tablet Take 50 mg by mouth once daily      tiotropium-olodateroL (STIOLTO RESPIMAT) 2.5-2.5 mcg/actuation inhaler Inhale into the lungs      traMADoL (ULTRAM) 50 mg tablet TAKE 1 TABLET BY MOUTH EVERY 6 HOURS FOR 10 DAYS AS NEEDED FOR PAIN       No current facility-administered medications on file prior to visit.     Family History Family History Problem Relation Age of Onset  High blood pressure (Hypertension) Mother    Lung cancer Mother        Social History   Tobacco Use Smoking Status Former Smoker  Quit date: 2015  Years since quitting: 7.8 Smokeless Tobacco Never Used     Social History Social History    Socioeconomic History  Marital status: Divorced Tobacco Use  Smoking status: Former Smoker     Quit date: 2015     Years since quitting: 7.8  Smokeless tobacco: Never Used Substance and Sexual Activity  Alcohol use: Never  Drug use: Never      Objective:     There were no vitals filed for this visit.  There is no height or weight on file to calculate BMI.   Physical Exam Constitutional:      Appearance: Normal appearance.  HENT:     Head: Normocephalic and atraumatic.     Mouth/Throat:     Mouth: Mucous membranes are moist.     Pharynx: Oropharynx is clear.  Eyes:     General: No scleral icterus.    Pupils: Pupils are equal, round, and reactive to light.  Cardiovascular:     Rate and Rhythm: Normal rate and regular rhythm.     Pulses: Normal pulses.     Heart sounds: No murmur heard.   No friction rub. No gallop.  Pulmonary:     Effort: Pulmonary effort is normal. No respiratory distress.     Breath sounds: Normal breath sounds. No stridor.  Abdominal:     General: Abdomen is flat.    Musculoskeletal:        General: No swelling.  Skin:     General: Skin is warm.  Neurological:     General: No focal deficit present.     Mental Status: She is alert and oriented to person, place, and time. Mental status is at baseline.  Psychiatric:        Mood and Affect: Mood normal.        Thought Content: Thought content normal.        Judgment: Judgment normal.        Assessment and Plan: Diagnoses and all orders for this visit:   Recurrent ventral incisional hernia     Angela [name] is a 75 y.o. female    1.  We will proceed to the OR for a lap ventral hernia repair with mesh, and lysis of adhesions. 2. All risks and benefits were discussed with the patient, to generally include infection, bleeding, damage to surrounding structures, acute and chronic nerve pain, and recurrence. Alternatives were offered and described.  All questions were answered and the patient voiced understanding of the procedure and wishes to proceed at this point.             No follow-ups on file.   Ralene Ok, MD, [facility], [facility]

## 2021-10-11 DIAGNOSIS — Z8673 Personal history of transient ischemic attack (TIA), and cerebral infarction without residual deficits: Secondary | ICD-10-CM | POA: Diagnosis not present

## 2021-10-11 DIAGNOSIS — I10 Essential (primary) hypertension: Secondary | ICD-10-CM | POA: Diagnosis not present

## 2021-10-11 DIAGNOSIS — G4733 Obstructive sleep apnea (adult) (pediatric): Secondary | ICD-10-CM | POA: Diagnosis not present

## 2021-10-13 DIAGNOSIS — N3001 Acute cystitis with hematuria: Secondary | ICD-10-CM | POA: Diagnosis not present

## 2021-10-13 DIAGNOSIS — R300 Dysuria: Secondary | ICD-10-CM | POA: Diagnosis not present

## 2021-10-13 DIAGNOSIS — B029 Zoster without complications: Secondary | ICD-10-CM | POA: Diagnosis not present

## 2021-10-22 DIAGNOSIS — N183 Chronic kidney disease, stage 3 unspecified: Secondary | ICD-10-CM | POA: Diagnosis not present

## 2021-10-22 DIAGNOSIS — H353131 Nonexudative age-related macular degeneration, bilateral, early dry stage: Secondary | ICD-10-CM | POA: Diagnosis not present

## 2021-10-22 DIAGNOSIS — Z7984 Long term (current) use of oral hypoglycemic drugs: Secondary | ICD-10-CM | POA: Diagnosis not present

## 2021-10-22 DIAGNOSIS — I700 Atherosclerosis of aorta: Secondary | ICD-10-CM | POA: Diagnosis not present

## 2021-10-22 DIAGNOSIS — Z79899 Other long term (current) drug therapy: Secondary | ICD-10-CM | POA: Diagnosis not present

## 2021-10-22 DIAGNOSIS — I2510 Atherosclerotic heart disease of native coronary artery without angina pectoris: Secondary | ICD-10-CM | POA: Diagnosis not present

## 2021-10-22 DIAGNOSIS — E1169 Type 2 diabetes mellitus with other specified complication: Secondary | ICD-10-CM | POA: Diagnosis not present

## 2021-10-22 DIAGNOSIS — J449 Chronic obstructive pulmonary disease, unspecified: Secondary | ICD-10-CM | POA: Diagnosis not present

## 2021-10-22 DIAGNOSIS — Z01818 Encounter for other preprocedural examination: Secondary | ICD-10-CM | POA: Diagnosis not present

## 2021-10-22 DIAGNOSIS — J84112 Idiopathic pulmonary fibrosis: Secondary | ICD-10-CM | POA: Diagnosis not present

## 2021-10-22 DIAGNOSIS — I10 Essential (primary) hypertension: Secondary | ICD-10-CM | POA: Diagnosis not present

## 2021-11-10 ENCOUNTER — Telehealth: Payer: Self-pay | Admitting: Internal Medicine

## 2021-11-10 NOTE — Telephone Encounter (Signed)
Thank you for the update.  Closing the note

## 2021-11-10 NOTE — Telephone Encounter (Signed)
This is an FYI for the provider.  °

## 2021-11-18 ENCOUNTER — Encounter (HOSPITAL_COMMUNITY): Admission: RE | Payer: Self-pay | Source: Home / Self Care

## 2021-11-18 ENCOUNTER — Ambulatory Visit (HOSPITAL_COMMUNITY): Admission: RE | Admit: 2021-11-18 | Payer: PPO | Source: Home / Self Care | Admitting: General Surgery

## 2021-11-18 SURGERY — REPAIR, HERNIA, INCISIONAL, LAPAROSCOPIC
Anesthesia: General

## 2021-11-28 ENCOUNTER — Emergency Department (HOSPITAL_BASED_OUTPATIENT_CLINIC_OR_DEPARTMENT_OTHER): Payer: PPO

## 2021-11-28 ENCOUNTER — Emergency Department (HOSPITAL_BASED_OUTPATIENT_CLINIC_OR_DEPARTMENT_OTHER): Payer: PPO | Admitting: Radiology

## 2021-11-28 ENCOUNTER — Emergency Department (HOSPITAL_BASED_OUTPATIENT_CLINIC_OR_DEPARTMENT_OTHER)
Admission: EM | Admit: 2021-11-28 | Discharge: 2021-11-28 | Disposition: A | Discharge status: Home / Self Care | Payer: PPO | Attending: Emergency Medicine | Admitting: Emergency Medicine

## 2021-11-28 ENCOUNTER — Other Ambulatory Visit: Payer: Self-pay

## 2021-11-28 ENCOUNTER — Encounter (HOSPITAL_BASED_OUTPATIENT_CLINIC_OR_DEPARTMENT_OTHER): Payer: Self-pay

## 2021-11-28 DIAGNOSIS — U071 COVID-19: Secondary | ICD-10-CM | POA: Insufficient documentation

## 2021-11-28 DIAGNOSIS — I10 Essential (primary) hypertension: Secondary | ICD-10-CM | POA: Diagnosis not present

## 2021-11-28 DIAGNOSIS — X58XXXA Exposure to other specified factors, initial encounter: Secondary | ICD-10-CM | POA: Diagnosis not present

## 2021-11-28 DIAGNOSIS — T189XXA Foreign body of alimentary tract, part unspecified, initial encounter: Secondary | ICD-10-CM | POA: Diagnosis not present

## 2021-11-28 DIAGNOSIS — G4489 Other headache syndrome: Secondary | ICD-10-CM | POA: Diagnosis not present

## 2021-11-28 DIAGNOSIS — R519 Headache, unspecified: Secondary | ICD-10-CM | POA: Diagnosis not present

## 2021-11-28 DIAGNOSIS — R52 Pain, unspecified: Secondary | ICD-10-CM

## 2021-11-28 DIAGNOSIS — W19XXXA Unspecified fall, initial encounter: Secondary | ICD-10-CM | POA: Diagnosis not present

## 2021-11-28 DIAGNOSIS — M160 Bilateral primary osteoarthritis of hip: Secondary | ICD-10-CM | POA: Diagnosis not present

## 2021-11-28 MED ORDER — HYDROCHLOROTHIAZIDE 25 MG PO TABS
12.5000 mg | ORAL_TABLET | Freq: Once | ORAL | Status: AC
  Administered 2021-11-28: 12.5 mg via ORAL
  Filled 2021-11-28: qty 1

## 2021-11-28 MED ORDER — LOSARTAN POTASSIUM 25 MG PO TABS
50.0000 mg | ORAL_TABLET | Freq: Once | ORAL | Status: AC
  Administered 2021-11-28: 50 mg via ORAL
  Filled 2021-11-28: qty 2

## 2021-11-28 MED ORDER — ACETAMINOPHEN 325 MG PO TABS
650.0000 mg | ORAL_TABLET | Freq: Once | ORAL | Status: AC
  Administered 2021-11-28: 650 mg via ORAL
  Filled 2021-11-28: qty 2

## 2021-11-28 MED ORDER — NIRMATRELVIR/RITONAVIR (PAXLOVID) TABLET (RENAL DOSING): 2.0000 | ORAL_TABLET | Freq: Two times a day (BID) | ORAL | 0 refills | Status: AC

## 2021-11-28 NOTE — ED Notes (Signed)
Dc instructions reviewed with patient. Patient voiced understanding. Dc with belongings.  °

## 2021-11-28 NOTE — Discharge Instructions (Addendum)
While you are taking the Paxlovid, please hold your rosuvastatin (Crestor).  You may resume this 5 days after finishing the course of Paxlovid.  Follow isolation precautions per CDC guidelines.  If develop chest pain, difficulty breathing, abdominal pain, vomiting or other concerning symptom, come back to ER for reassessment.  Continue current blood pressure medicines.  Follow-up with primary doctor to discuss blood pressure management long-term.  Regarding the dental crown you ingested, please have your primary care doctor repeat an x-ray in 1 to 2 weeks to ensure that this has passed.

## 2021-11-28 NOTE — ED Notes (Signed)
Patient transported to CT 

## 2021-11-28 NOTE — ED Triage Notes (Signed)
Pt arrives [facility] from [facility].  States she tested positive for Covid this am, and this is the third time she has had Covid.   States she has a headache that started this am.

## 2021-11-28 NOTE — ED Provider Notes (Signed)
[facility] EMERGENCY DEPT Provider Note   CSN: [id number] Arrival date & time: 11/28/21  0747     History  Chief Complaint  Patient presents with   Headache    Angela [name] is a 76 y.o. female.  Presents to the emergency room with concern for COVID, headache.  Patient reports that this morning she woke up with sore throat, body aches, congestion as well as headache.  Headache is frontal, not sudden onset, not worst headache of her life, currently mild to moderate.  No obvious alleviating or aggravating factors.  No associated numbness, weakness, speech or vision change.  Reports that she tested positive for COVID this morning at her facility.   Patient also endorses that a couple days ago she had a mechanical fall, landed on her right hip and also believes that she hit her head.  Did not pass out, has been able to walk since without any difficulty.  Not on blood thinners.  Has noticed bruise to her right hip.  Additionally, patient thinks that a couple days ago she accidentally swallowed her dental crown.  Has not had any abdominal pain nausea or vomiting.      HPI     Home Medications Prior to Admission medications   Medication Sig Start Date End Date Taking? Authorizing Provider  acetaminophen (TYLENOL) 500 MG tablet Take 500 mg by mouth every 6 (six) hours as needed.   Yes [provider]  Bacillus Coagulans-Inulin (PROBIOTIC-PREBIOTIC PO) Take 2 tablets by mouth in the morning.   Yes [provider]  brimonidine (ALPHAGAN) 0.15 % ophthalmic solution Place 1 drop into both eyes in the morning and at bedtime. 07/12/17  Yes [provider]  buPROPion (WELLBUTRIN XL) 300 MG 24 hr tablet Take 300 mg by mouth in the morning. 05/04/16  Yes [provider]  Camphor-Eucalyptus-Menthol (VICKS VAPORUB) 4.73-1.2-2.6 % OINT Apply 1 application topically at bedtime.   Yes [provider]  cyclobenzaprine (FLEXERIL) 10 MG tablet Take  10 mg by mouth at bedtime as needed for muscle spasms.  06/09/15  Yes [provider]  hydrochlorothiazide (HYDRODIURIL) 12.5 MG tablet Take 12.5 mg by mouth every morning. 08/30/21  Yes [provider]  losartan (COZAAR) 50 MG tablet Take 50 mg by mouth in the morning. 11/24/19  Yes [provider]  meclizine (ANTIVERT) 12.5 MG tablet Take 1 tablet (12.5 mg total) by mouth 3 (three) times daily as needed for dizziness. 12/16/18  Yes Palumbo, April, MD  Menthol (ICY HOT) 5 % [facility] Place 1 patch onto the skin daily as needed (back pain.).   Yes [provider]  Menthol, Topical Analgesic, (BIOFREEZE EX) Apply 1 application topically 3 (three) times daily as needed (back pain.).   Yes [provider]  metFORMIN (GLUCOPHAGE-XR) 500 MG 24 hr tablet Take 1,000 mg by mouth in the morning and at bedtime. 01/25/18  Yes [provider]  metoprolol succinate (TOPROL-XL) 25 MG 24 hr tablet TAKE 1 TABLET(25 MG) BY MOUTH DAILY 05/22/17  Yes Jerline Pain, MD  Multiple Vitamin (MULTIVITAMIN WITH MINERALS) TABS tablet Take 1 tablet by mouth in the morning.   Yes [provider]  nirmatrelvir/ritonavir EUA, renal dosing, (PAXLOVID) 10 x 150 MG & 10 x 100MG  TABS Take 2 tablets by mouth 2 (two) times daily for 5 days. Patient GFR is 37. Take nirmatrelvir (150 mg) one tablet twice daily for 5 days and ritonavir (100 mg) one tablet twice daily for 5 days. 11/28/21  12/03/21 Yes Lucrezia Starch, MD  omeprazole (PRILOSEC) 40 MG capsule TAKE 1 CAPSULE(40 MG) BY MOUTH TWICE DAILY BEFORE LUNCH AND SUPPER 05/04/20  Yes Mansouraty, Telford Nab., MD  PROLASTIN-C 1000 MG/20ML SOLN Inject 1,000 mg into the vein every Tuesday. 09/14/21  Yes [provider]  rosuvastatin (CRESTOR) 5 MG tablet Take 5 mg by mouth daily.   Yes [provider]  sertraline (ZOLOFT) 50 MG tablet Take 50 mg by mouth in the morning.   Yes [provider]  Tiotropium  Bromide-Olodaterol (STIOLTO RESPIMAT) 2.5-2.5 MCG/ACT AERS Inhale 2 puffs into the lungs daily. 01/20/21  Yes Parrett, Tammy S, NP  vitamin B-12 (CYANOCOBALAMIN) 1000 MCG tablet Take 1,000 mcg by mouth in the morning.   Yes [provider]  albuterol (PROVENTIL) (2.5 MG/3ML) 0.083% nebulizer solution Take 3 mLs (2.5 mg total) by nebulization every 6 (six) hours as needed for wheezing or shortness of breath. 01/29/21   Parrett, Fonnie Mu, NP  dimethicone (REMEDY NUTRASHIELD) 1 % cream Apply 1 application topically 2 (two) times daily as needed for dry skin.    [provider]  glipiZIDE (GLUCOTROL XL) 5 MG 24 hr tablet Take 5 mg by mouth every evening. Patient not taking: Reported on 11/28/2021 11/21/17   [provider]  latanoprost (XALATAN) 0.005 % ophthalmic solution Place 1 drop into the left eye at bedtime. Left eye Patient not taking: Reported on 11/28/2021    [provider]  OVER THE COUNTER MEDICATION Take 1 capsule by mouth daily as needed (congestion). Serrapeptase Capsules    [provider]      Allergies    Tizanidine, Influenza vaccines, Other, Vicodin hp [hydrocodone-acetaminophen], Betadine [povidone iodine], Ciprofloxacin, Codeine, and Iohexol    Review of Systems   Review of Systems  Constitutional:  Positive for fatigue. Negative for chills and fever.  HENT:  Positive for sinus pain and sore throat. Negative for ear pain.   Eyes:  Negative for pain and visual disturbance.  Respiratory:  Positive for cough. Negative for shortness of breath.   Cardiovascular:  Negative for chest pain and palpitations.  Gastrointestinal:  Negative for abdominal pain and vomiting.  Genitourinary:  Negative for dysuria and hematuria.  Musculoskeletal:  Positive for myalgias. Negative for arthralgias and back pain.  Skin:  Negative for color change and rash.  Neurological:  Negative for seizures and syncope.  All other systems reviewed and are  negative.  Physical Exam Updated Vital Signs BP (!) 160/59    Pulse 62    Temp 98.8 F (37.1 C) (Oral)    Resp 18    Ht 5\' 7"  (1.702 m)    Wt 95.7 kg    LMP  (LMP Unknown)    SpO2 98%    BMI 33.05 kg/m  Physical Exam Vitals and nursing note reviewed.  Constitutional:      General: She is not in acute distress.    Appearance: She is well-developed.  HENT:     Head: Normocephalic and atraumatic.  Eyes:     Conjunctiva/sclera: Conjunctivae normal.  Cardiovascular:     Rate and Rhythm: Normal rate and regular rhythm.     Heart sounds: No murmur heard. Pulmonary:     Effort: Pulmonary effort is normal. No respiratory distress.     Breath sounds: Normal breath sounds.  Abdominal:     Palpations: Abdomen is soft.     Tenderness: There is no abdominal tenderness.  Musculoskeletal:  General: No swelling.     Cervical back: Neck supple.     Comments: Back: no C, T, L spine TTP, no step off or deformity RUE: no TTP throughout, no deformity, normal joint ROM, radial pulse intact, distal sensation and motor intact LUE: no TTP throughout, no deformity, normal joint ROM, radial pulse intact, distal sensation and motor intact RLE: Some tenderness as well as modest ecchymosis over the right hip, normal joint ROM, distal pulse, sensation and motor intact LLE: no TTP throughout, no deformity, normal joint ROM, distal pulse, sensation and motor intact  Skin:    General: Skin is warm and dry.     Capillary Refill: Capillary refill takes less than 2 seconds.  Neurological:     Mental Status: She is alert and oriented to person, place, and time.  Psychiatric:        Mood and Affect: Mood normal.    ED Results / Procedures / Treatments   Labs (all labs ordered are listed, but only abnormal results are displayed) Labs Reviewed - No data to display  EKG None  Radiology CT Head Wo Contrast  Result Date: 11/28/2021 CLINICAL DATA:  Headache EXAM: CT HEAD WITHOUT CONTRAST TECHNIQUE:  Contiguous axial images were obtained from the base of the skull through the vertex without intravenous contrast. RADIATION DOSE REDUCTION: This exam was performed according to the departmental dose-optimization program which includes automated exposure control, adjustment of the mA and/or kV according to patient size and/or use of iterative reconstruction technique. COMPARISON:  12/16/2018 and previous FINDINGS: Brain: Mild parenchymal atrophy. Mild areas of hypoattenuation in deep and periventricular white matter bilaterally. Negative for acute intracranial hemorrhage, mass lesion, acute infarction, midline shift, or mass-effect. Acute infarct may be inapparent on noncontrast CT. Ventricles and sulci symmetric. Vascular: Atherosclerotic and physiologic intracranial calcifications. Skull: Normal. Negative for fracture or focal lesion. Sinuses/Orbits: Left maxillary antrectomy.  Otherwise negative Other: None IMPRESSION: 1. Negative for bleed or other acute intracranial process. 2. Mild  nonspecific white matter changes. Electronically Signed   By: Lucrezia Europe M.D.   On: 11/28/2021 09:21   DG HIP UNILAT WITH PELVIS 2-3 VIEWS RIGHT  Result Date: 11/28/2021 CLINICAL DATA:  Recent fall, right buttock bruising, missing gold crown in mouth EXAM: DG HIP (WITH OR WITHOUT PELVIS) 2-3V RIGHT COMPARISON:  08/25/2021 CT abdomen/pelvis FINDINGS: Metallic tooth like density noted in the right lower quadrant. No pelvic fracture or diastasis. No right hip dislocation or fracture. Mild bilateral hip osteoarthritis. Mild degenerative changes at the symphysis pubis. Moderate degenerative changes in the visualized lower lumbar spine. No suspicious focal osseous lesions. IMPRESSION: No acute osseous abnormality. Mild bilateral hip osteoarthritis. Metallic tooth-like density in the right lower quadrant, suggestive of a swallowed metallic foreign body within a bowel loop (likely cecum). Electronically Signed   By: Ilona Sorrel M.D.    On: 11/28/2021 09:36    Procedures Procedures    Medications Ordered in ED Medications  hydrochlorothiazide (HYDRODIURIL) tablet 12.5 mg (12.5 mg Oral Given 11/28/21 0839)  losartan (COZAAR) tablet 50 mg (50 mg Oral Given 11/28/21 0839)  acetaminophen (TYLENOL) tablet 650 mg (650 mg Oral Given 11/28/21 5003)    ED Course/ Medical Decision Making/ A&P                           Medical Decision Making  76 year old lady presented to ER with initial reported chief complaint of headache however also having multiple other issues.  Regarding  the headache, sore throat, body aches, most consistent with COVID-19.  Patient reports that she has tested positive this morning at her facility.  Symptoms are consistent with COVID-19 infection.  Her lungs are clear, her oxygenation is normal, she has no tachypnea, low suspicion for pneumonia or lower respiratory infection at present.  Patient also endorsed having mechanical fall a couple days ago and having bruise to her right hip.  Did have some tenderness to the hip.  Plain film per my review and per radiologist is negative for acute traumatic pathology.  She did endorse hitting her head and therefore CT head was also checked which was negative.  Incidental finding on x-ray showed metallic toothlike density in the right lower quadrant suggesting swallowed metallic foreign body.  Patient does believe a couple days ago she may have swallowed a dental crown.  Patient has no GI complaints.  Recommended for this issue that she follow-up with her primary doctor and have repeat x-rays to ensure this passes.  Regarding COVID, given well appearance with reassuring vital signs, do not feel she needs inpatient admission and is appropriate for outpatient management at present.  Additional history obtained from chart review, noted last PCP visit in December 2019, patient has listed medical problems of diabetes, hypertension, COPD, CKD stage III, idiopathic pulmonary fibrosis.   Given his extensive medical problems, believe that patient would benefit from paxlovid.  Counseled on need to hold statin therapy while taking this medicine.  Reviewed return precautions and discharged.        Final Clinical Impression(s) / ED Diagnoses Final diagnoses:  [id number]  Foreign body in digestive tract, initial encounter  Nonintractable headache, unspecified chronicity pattern, unspecified headache type    Rx / DC Orders ED Discharge Orders          Ordered    nirmatrelvir/ritonavir EUA, renal dosing, (PAXLOVID) 10 x 150 MG & 10 x 100MG  TABS  2 times daily        11/28/21 1043              Lucrezia Starch, MD 11/28/21 1053

## 2022-01-03 ENCOUNTER — Telehealth: Payer: Self-pay | Admitting: Internal Medicine

## 2022-01-03 ENCOUNTER — Other Ambulatory Visit (HOSPITAL_COMMUNITY): Payer: Self-pay

## 2022-01-03 NOTE — Telephone Encounter (Signed)
This will be routed to RX Rheum/Pulm

## 2022-01-03 NOTE — Telephone Encounter (Signed)
Pt states a nurse comes and gives her the injection at home.

## 2022-01-03 NOTE — Telephone Encounter (Signed)
Pt stated that she will need a PA for prolastin. Pt has been without prolastin for 1 month and half. Can we start the PA

## 2022-01-07 ENCOUNTER — Telehealth: Payer: Self-pay | Admitting: Internal Medicine

## 2022-01-07 NOTE — Telephone Encounter (Signed)
Called Angela [name] back at the number that was provided. I was transferred to the doc line but no one answered the phone after being on hold for over 10 mins.  I have also called and left a VM for the patient to make sure she was ok.

## 2022-01-19 DIAGNOSIS — Z7984 Long term (current) use of oral hypoglycemic drugs: Secondary | ICD-10-CM | POA: Diagnosis not present

## 2022-01-19 DIAGNOSIS — J84112 Idiopathic pulmonary fibrosis: Secondary | ICD-10-CM | POA: Diagnosis not present

## 2022-01-19 DIAGNOSIS — E785 Hyperlipidemia, unspecified: Secondary | ICD-10-CM | POA: Diagnosis not present

## 2022-01-19 DIAGNOSIS — E1122 Type 2 diabetes mellitus with diabetic chronic kidney disease: Secondary | ICD-10-CM | POA: Diagnosis not present

## 2022-01-19 DIAGNOSIS — M5136 Other intervertebral disc degeneration, lumbar region: Secondary | ICD-10-CM | POA: Diagnosis not present

## 2022-01-19 DIAGNOSIS — N183 Chronic kidney disease, stage 3 unspecified: Secondary | ICD-10-CM | POA: Diagnosis not present

## 2022-01-19 DIAGNOSIS — I700 Atherosclerosis of aorta: Secondary | ICD-10-CM | POA: Diagnosis not present

## 2022-01-19 DIAGNOSIS — J439 Emphysema, unspecified: Secondary | ICD-10-CM | POA: Diagnosis not present

## 2022-01-19 DIAGNOSIS — I2510 Atherosclerotic heart disease of native coronary artery without angina pectoris: Secondary | ICD-10-CM | POA: Diagnosis not present

## 2022-01-24 ENCOUNTER — Telehealth: Payer: Self-pay | Admitting: Internal Medicine

## 2022-01-25 ENCOUNTER — Other Ambulatory Visit (HOSPITAL_COMMUNITY): Payer: Self-pay

## 2022-01-25 NOTE — Telephone Encounter (Signed)
Lm for patient.  

## 2022-01-25 NOTE — Telephone Encounter (Signed)
Angela [name] from [employer] needs more information for prior authorization for Prolastin. [facility] phone number is [phone]. ?

## 2022-01-25 NOTE — Telephone Encounter (Signed)
PA team, please advise. Thanks ?

## 2022-01-26 NOTE — Telephone Encounter (Signed)
Sending to Rheum/Pulm

## 2022-01-27 NOTE — Telephone Encounter (Signed)
Called [employer] phone for Prolastin prior authorization. Answered clinical questions - provided ICD codes and faxed clinicals for review ? ?Fax: [phone] ?Phone: [phone] ?Ref # [id number] ? ?Prescription form placed in Dr. Golden Pop mailbix ? ?Knox Saliva, PharmD, MPH, BCPS ?Clinical Pharmacist (Rheumatology and Pulmonology) ?

## 2022-01-31 NOTE — Telephone Encounter (Addendum)
Refill for Prolastin sent to [facility] via fax. ? ?Fax: [phone] ?Phone: [phone] ? ?Called patient to notify that prior authorization has also been submitted. Left VM to advise ? ?Knox Saliva, PharmD, MPH, BCPS ?Clinical Pharmacist (Rheumatology and Pulmonology) ?

## 2022-02-03 NOTE — Telephone Encounter (Signed)
Patient states received letter from [employer] and Prolastin is covered. Patient phone number is [phone]. ?

## 2022-02-15 NOTE — Telephone Encounter (Signed)
Called [employer] to determine status of patient's Prolastin. Her last shipment went out today on 02/15/22. She infuses on Tuesdays ? ?Closing encounter ? ?Knox Saliva, PharmD, MPH, BCPS ?Clinical Pharmacist (Rheumatology and Pulmonology) ?

## 2022-02-22 DIAGNOSIS — Z8673 Personal history of transient ischemic attack (TIA), and cerebral infarction without residual deficits: Secondary | ICD-10-CM | POA: Diagnosis not present

## 2022-02-22 DIAGNOSIS — I10 Essential (primary) hypertension: Secondary | ICD-10-CM | POA: Diagnosis not present

## 2022-02-22 DIAGNOSIS — G4733 Obstructive sleep apnea (adult) (pediatric): Secondary | ICD-10-CM | POA: Diagnosis not present

## 2022-02-23 NOTE — Telephone Encounter (Signed)
See encounter from 01/24/22.  ?

## 2022-04-04 ENCOUNTER — Ambulatory Visit (INDEPENDENT_AMBULATORY_CARE_PROVIDER_SITE_OTHER): Payer: PPO

## 2022-04-04 ENCOUNTER — Ambulatory Visit: Payer: PPO | Admitting: Adult Health

## 2022-04-04 ENCOUNTER — Encounter: Payer: Self-pay | Admitting: Adult Health

## 2022-04-04 VITALS — BP 140/70 | HR 68 | Temp 98.4°F | Ht 67.5 in | Wt 205.4 lb

## 2022-04-04 DIAGNOSIS — J439 Emphysema, unspecified: Secondary | ICD-10-CM

## 2022-04-04 DIAGNOSIS — R059 Cough, unspecified: Secondary | ICD-10-CM | POA: Diagnosis not present

## 2022-04-04 DIAGNOSIS — J84112 Idiopathic pulmonary fibrosis: Secondary | ICD-10-CM

## 2022-04-04 DIAGNOSIS — J8410 Pulmonary fibrosis, unspecified: Secondary | ICD-10-CM

## 2022-04-04 DIAGNOSIS — E8801 Alpha-1-antitrypsin deficiency: Secondary | ICD-10-CM

## 2022-04-04 DIAGNOSIS — J449 Chronic obstructive pulmonary disease, unspecified: Secondary | ICD-10-CM | POA: Diagnosis not present

## 2022-04-04 DIAGNOSIS — J441 Chronic obstructive pulmonary disease with (acute) exacerbation: Secondary | ICD-10-CM

## 2022-04-04 LAB — POC COVID19 BINAXNOW: SARS Coronavirus 2 Ag: NEGATIVE

## 2022-04-04 MED ORDER — PREDNISONE 10 MG PO TABS: ORAL_TABLET | ORAL | 0 refills | Status: DC

## 2022-04-04 MED ORDER — AMOXICILLIN-POT CLAVULANATE 875-125 MG PO TABS: 1.0000 | ORAL_TABLET | Freq: Two times a day (BID) | ORAL | 0 refills | Status: DC

## 2022-04-04 NOTE — Assessment & Plan Note (Signed)
MZ phenotype with low level.  Patient to continue on Prolastin replacement

## 2022-04-04 NOTE — Assessment & Plan Note (Signed)
Acute COPD exacerbation.   Chest x-ray today in the office with no acute process.  Chronic pulmonary fibrosis changes.  COVID-19 testing is negative.  Patient will be started on empiric antibiotics and steroids.  Plan  Patient Instructions  Begin Augmentin 875 mg twice daily for 7 days, take with food Prednisone taper over the next week Fluids and rest. Tylenol as needed Mucinex DM Twice daily  As needed  Cough/congestion  Continue on Stiolto 2 puffs daily .  Albuterol As needed  . Albuterol neb As needed   Follow up in 6-8 weeks with Dr. Chase Caller or Tommy Goostree NP and As needed   .  Please contact office for sooner follow up if symptoms do not improve or worsen or seek emergency care

## 2022-04-04 NOTE — Assessment & Plan Note (Signed)
Previously intolerant to Esbriet.  Continue all current maintenance regimen

## 2022-04-04 NOTE — Progress Notes (Signed)
$'@Patient'o$  ID: Angela [name], female    DOB: 11-16-1945, 76 y.o.   MRN: [id number]  Chief Complaint  Patient presents with   Follow-up    Referring provider: Maurice Small, MD  HPI: 76 year old female former smoker followed for COPD with emphysema, MZ phenotype with low levels of alpha-1 on replacement therapy, idiopathic pulmonary fibrosis Medical history significant for obstructive sleep apnea on CPAP COVID-19 infection in 2021 and 2022  TEST/EVENTS :  High-resolution CT chest November 2020 showed stable ILD changes, probable UIP since 2018, emphysema   Pulmonary function test February 14, 2018 FEV1 72%, ratio 78, FVC 70%, and 41%  PFTs August 23, 2021 FEV1 83%, ratio 80, DLCO 58%   Trial of Esbriet in the past unable to tolerate due to GI side effects  04/04/2022 Acute OV : COPD  Patient presents for an acute office visit. Complains of 3 weeks of increased cough, congestion, thick mucus, intermittent wheezing and low-grade fever.  Patient's been using over-the-counter cold meds without much relief.  Patient remains on Stiolto daily.  Has had increased albuterol use. Appetite is good with no nausea vomiting diarrhea.  Chest x-ray today shows chronic fibrosis with no acute process noted.  COVID-19 testing in the office today is negative. She denies any hemoptysis, chest pain, orthopnea or increased edema.  Allergies  Allergen Reactions   Tizanidine Other (See Comments)    Severe decrease in BP    Influenza Vaccines Other (See Comments)    becomes sick with the flu   Other     PATIENT STATES SHE CANNOT TAKE ANY VACCINATIONS PER HER PHYSICIAN   Vicodin Hp [Hydrocodone-Acetaminophen] Nausea And Vomiting   Betadine [Povidone Iodine] Rash   Ciprofloxacin Rash   Codeine Nausea And Vomiting   Iohexol      Desc: Pt states several years ago during a CT scan w/ iv cotnrast she had severe nausea and vomiting, sob w/ difficulty breathing and swallowing.  She had full 13hr. premeds today  (5/14/7) and did fine w/o complications.     Immunization History  Administered Date(s) Administered   Influenza, High Dose Seasonal PF 09/13/2017, 08/14/2020   Moderna Sars-Covid-2 Vaccination 12/16/2019, 01/13/2020, 09/22/2020, 06/09/2021   Pneumococcal Conjugate-13 10/15/2014   Pneumococcal-Unspecified 11/14/2012   Tdap 09/05/2016   Zoster, Live 07/16/2021    Past Medical History:  Diagnosis Date   Allergy    Alpha 1-antitrypsin PiMS phenotype    Anemia 2014   mild   Anxiety    Arthritis    Asthma    Blood transfusion without reported diagnosis    Cataract    Chronic kidney disease    early stages of some insufficiency   Complication of anesthesia    passes out 24 hrs after getting   COPD (chronic obstructive pulmonary disease) ([place])    DDD (degenerative disc disease)    Depression    with anxiety   Diabetes mellitus    Diverticulitis    Diverticulosis    Esophageal dysmotility 03/01/06   mild   Fibromyalgia    GERD (gastroesophageal reflux disease)    Glaucoma    Heart murmur    "years ago"   Hypercholesterolemia    Hypertension    IPF (idiopathic pulmonary fibrosis) (HCC)    Shingles    Shortness of breath dyspnea    Sleep apnea    wears CPAP   Stroke ([place]) 2010   3 tia's    Tobacco History: Social History   Tobacco Use  Smoking Status  Former   Packs/day: 1.00   Years: 22.00   Pack years: 22.00   Types: Cigarettes   Start date: 40   Quit date: 04/14/2012   Years since quitting: 9.9  Smokeless Tobacco Never   Counseling given: Not Answered   Outpatient Medications Prior to Visit  Medication Sig Dispense Refill   acetaminophen (TYLENOL) 500 MG tablet Take 500 mg by mouth every 6 (six) hours as needed.     albuterol (PROVENTIL) (2.5 MG/3ML) 0.083% nebulizer solution Take 3 mLs (2.5 mg total) by nebulization every 6 (six) hours as needed for wheezing or shortness of breath. 90 mL 5   Bacillus Coagulans-Inulin (PROBIOTIC-PREBIOTIC PO) Take 2  tablets by mouth in the morning.     brimonidine (ALPHAGAN) 0.15 % ophthalmic solution Place 1 drop into both eyes in the morning and at bedtime.  3   buPROPion (WELLBUTRIN XL) 300 MG 24 hr tablet Take 300 mg by mouth in the morning.     Camphor-Eucalyptus-Menthol (VICKS VAPORUB) 4.73-1.2-2.6 % OINT Apply 1 application topically at bedtime.     cyclobenzaprine (FLEXERIL) 10 MG tablet Take 10 mg by mouth at bedtime as needed for muscle spasms.   0   dimethicone (REMEDY NUTRASHIELD) 1 % cream Apply 1 application topically 2 (two) times daily as needed for dry skin.     glipiZIDE (GLUCOTROL XL) 5 MG 24 hr tablet Take 5 mg by mouth every evening.  11   hydrochlorothiazide (HYDRODIURIL) 12.5 MG tablet Take 12.5 mg by mouth every morning.     latanoprost (XALATAN) 0.005 % ophthalmic solution Place 1 drop into the left eye at bedtime. Left eye     losartan (COZAAR) 50 MG tablet Take 50 mg by mouth in the morning.     meclizine (ANTIVERT) 12.5 MG tablet Take 1 tablet (12.5 mg total) by mouth 3 (three) times daily as needed for dizziness. 16 tablet 0   Menthol (ICY HOT) 5 % PTCH Place 1 patch onto the skin daily as needed (back pain.).     Menthol, Topical Analgesic, (BIOFREEZE EX) Apply 1 application topically 3 (three) times daily as needed (back pain.).     metFORMIN (GLUCOPHAGE-XR) 500 MG 24 hr tablet Take 1,000 mg by mouth in the morning and at bedtime.  11   metoprolol succinate (TOPROL-XL) 25 MG 24 hr tablet TAKE 1 TABLET(25 MG) BY MOUTH DAILY 90 tablet 1   Multiple Vitamin (MULTIVITAMIN WITH MINERALS) TABS tablet Take 1 tablet by mouth in the morning.     omeprazole (PRILOSEC) 40 MG capsule TAKE 1 CAPSULE(40 MG) BY MOUTH TWICE DAILY BEFORE LUNCH AND SUPPER 90 capsule 3   OVER THE COUNTER MEDICATION Take 1 capsule by mouth daily as needed (congestion). Serrapeptase Capsules     PROLASTIN-C 1000 MG/20ML SOLN Inject 1,000 mg into the vein every Tuesday.     rosuvastatin (CRESTOR) 5 MG tablet Take 5 mg  by mouth daily.     sertraline (ZOLOFT) 50 MG tablet Take 50 mg by mouth in the morning.     Tiotropium Bromide-Olodaterol (STIOLTO RESPIMAT) 2.5-2.5 MCG/ACT AERS Inhale 2 puffs into the lungs daily. 4 g 5   vitamin B-12 (CYANOCOBALAMIN) 1000 MCG tablet Take 1,000 mcg by mouth in the morning.     No facility-administered medications prior to visit.     Review of Systems:   Constitutional:   No  weight loss, night sweats,   +Fevers, chills, fatigue, or  lassitude.  HEENT:   No headaches,  Difficulty swallowing,  Tooth/dental problems, or  Sore throat,                No sneezing, itching, ear ache,  +nasal congestion, post nasal drip,   CV:  No chest pain,  Orthopnea, PND, swelling in lower extremities, anasarca, dizziness, palpitations, syncope.   GI  No heartburn, indigestion, abdominal pain, nausea, vomiting, diarrhea, change in bowel habits, loss of appetite, bloody stools.   Resp:  No chest wall deformity  Skin: no rash or lesions.  GU: no dysuria, change in color of urine, no urgency or frequency.  No flank pain, no hematuria   MS:  No joint pain or swelling.  No decreased range of motion.  No back pain.    Physical Exam  BP 140/70 (BP Location: Left Arm, Patient Position: Sitting, Cuff Size: Large)   Pulse 68   Temp 98.4 F (36.9 C) (Oral)   Ht 5' 7.5" (1.715 m)   Wt 205 lb 6.4 oz (93.2 kg)   LMP  (LMP Unknown)   SpO2 95%   BMI 31.70 kg/m   GEN: A/Ox3; pleasant , NAD, well nourished    HEENT:  [place]/AT,  EACs-clear, TMs-wnl, NOSE-clear, THROAT-clear, no lesions, no postnasal drip or exudate noted.   NECK:  Supple w/ fair ROM; no JVD; normal carotid impulses w/o bruits; no thyromegaly or nodules palpated; no lymphadenopathy.    RESP bibasilar crackles  no accessory muscle use, no dullness to percussion  CARD:  RRR, no m/r/g, tr  peripheral edema, pulses intact, no cyanosis or clubbing.  GI:   Soft & nt; nml bowel sounds; no organomegaly or masses detected.    [name]: Warm bil, no deformities or joint swelling noted.   Neuro: alert, no focal deficits noted.    Skin: Warm, no lesions or rashes    Lab Results:  CBC    BNP No results found for: BNP  ProBNP No results found for: PROBNP  Imaging: DG Chest 2 View  Result Date: 04/04/2022 CLINICAL DATA:  copd flare EXAM: CHEST - 2 VIEW COMPARISON:  March 12, 2021. FINDINGS: Similar extensive interstitial prominence, compatible with interstitial lung disease and emphysema that was better characterized on prior CT chest. No new consolidation. No visible pleural effusions or pneumothorax. Similar cardiomediastinal silhouette. Partially imaged fusion hardware in the cervical spine. Polyarticular degenerative change. IMPRESSION: Similar extensive interstitial prominence, compatible with interstitial lung disease and emphysema that was better characterized on prior CT chest. No definite superimposed acute abnormality. Electronically Signed   By: Margaretha Sheffield M.D.   On: 04/04/2022 11:41         Latest Ref Rng & Units 08/23/2021    1:33 PM 02/14/2018    9:14 AM 09/13/2017    9:05 AM 06/08/2017    8:48 AM 08/16/2016   10:21 AM 03/04/2016   12:53 PM 08/28/2015    4:37 PM  PFT Results  FVC-Pre L 2.60   2.44   2.45   2.63   2.69   2.79   2.81    FVC-Predicted Pre % 78   70   70   75   76   79   79    FVC-Post L      2.84   2.75    FVC-Predicted Post %      81   77    Pre FEV1/FVC % % 80   78   77   75   79   75   75  Post FEV1/FCV % %      77   79    FEV1-Pre L 2.09   1.89   1.90   1.97   2.11   2.10   2.10    FEV1-Predicted Pre % 83   72   72   74   79   78   77    FEV1-Post L      2.20   2.16    DLCO uncorrected ml/min/mmHg 12.64   12.16   12.31   13.31   14.10   14.76   15.56    DLCO UNC% % 58   41   42   45   48   50   53    DLCO corrected ml/min/mmHg 12.64    12.35   13.43   13.90   15.20     DLCO COR %Predicted % 58    42   46   47   52     DLVA Predicted % 80   64   68   63   61    66   70    TLC L      4.85   4.11    TLC % Predicted %      86   73    RV % Predicted %      84   59      No results found for: NITRICOXIDE      Assessment & Plan:   COPD exacerbation (HCC) Acute COPD exacerbation.   Chest x-ray today in the office with no acute process.  Chronic pulmonary fibrosis changes.  COVID-19 testing is negative.  Patient will be started on empiric antibiotics and steroids.  Plan  Patient Instructions  Begin Augmentin 875 mg twice daily for 7 days, take with food Prednisone taper over the next week Fluids and rest. Tylenol as needed Mucinex DM Twice daily  As needed  Cough/congestion  Continue on Stiolto 2 puffs daily .  Albuterol As needed  . Albuterol neb As needed   Follow up in 6-8 weeks with Dr. Chase Caller or Oluwatobi Visser NP and As needed   .  Please contact office for sooner follow up if symptoms do not improve or worsen or seek emergency care        Alpha-1-antitrypsin deficiency ([place]) MZ phenotype with low level.  Patient to continue on Prolastin replacement  IPF (idiopathic pulmonary fibrosis) (HCC) Previously intolerant to Esbriet.  Continue all current maintenance regimen   I spent   42 minutes dedicated to the care of this patient on the date of this encounter to include pre-visit review of records, face-to-face time with the patient discussing conditions above, post visit ordering of testing, clinical documentation with the electronic health record, making appropriate referrals as documented, and communicating necessary findings to members of the patients care team.    Rexene Edison, NP 04/04/2022

## 2022-04-04 NOTE — Patient Instructions (Addendum)
Begin Augmentin 875 mg twice daily for 7 days, take with food Prednisone taper over the next week Fluids and rest. Tylenol as needed Mucinex DM Twice daily  As needed  Cough/congestion  Continue on Stiolto 2 puffs daily .  Albuterol As needed  . Albuterol neb As needed   Follow up in 6-8 weeks with Dr. Chase Caller or Hannelore Bova NP and As needed   .  Please contact office for sooner follow up if symptoms do not improve or worsen or seek emergency care

## 2022-04-25 DIAGNOSIS — Z87891 Personal history of nicotine dependence: Secondary | ICD-10-CM | POA: Diagnosis not present

## 2022-04-25 DIAGNOSIS — J449 Chronic obstructive pulmonary disease, unspecified: Secondary | ICD-10-CM | POA: Diagnosis not present

## 2022-04-25 DIAGNOSIS — Z7984 Long term (current) use of oral hypoglycemic drugs: Secondary | ICD-10-CM | POA: Diagnosis not present

## 2022-04-25 DIAGNOSIS — I10 Essential (primary) hypertension: Secondary | ICD-10-CM | POA: Diagnosis not present

## 2022-04-25 DIAGNOSIS — E119 Type 2 diabetes mellitus without complications: Secondary | ICD-10-CM | POA: Diagnosis not present

## 2022-04-25 DIAGNOSIS — E785 Hyperlipidemia, unspecified: Secondary | ICD-10-CM | POA: Diagnosis not present

## 2022-04-25 DIAGNOSIS — J84112 Idiopathic pulmonary fibrosis: Secondary | ICD-10-CM | POA: Diagnosis not present

## 2022-05-31 DIAGNOSIS — B009 Herpesviral infection, unspecified: Secondary | ICD-10-CM | POA: Diagnosis not present

## 2022-06-07 DIAGNOSIS — I10 Essential (primary) hypertension: Secondary | ICD-10-CM | POA: Diagnosis not present

## 2022-06-07 DIAGNOSIS — Z8673 Personal history of transient ischemic attack (TIA), and cerebral infarction without residual deficits: Secondary | ICD-10-CM | POA: Diagnosis not present

## 2022-06-07 DIAGNOSIS — G4733 Obstructive sleep apnea (adult) (pediatric): Secondary | ICD-10-CM | POA: Diagnosis not present

## 2022-06-09 DIAGNOSIS — B009 Herpesviral infection, unspecified: Secondary | ICD-10-CM | POA: Diagnosis not present

## 2022-07-03 ENCOUNTER — Telehealth: Payer: Self-pay | Admitting: Internal Medicine

## 2022-07-03 NOTE — Telephone Encounter (Signed)
   Angela [name] 09-18-1946 seen by Rexene Edison May 2023 and was instricuted to see me in 8 weeks or so. AS of now no appt visitble  Plan  - spiro/dlco first avai; - return to see me 30 min visit first avai  Thanks    SIGNATURE    Dr. Brand Males, M.D., F.C.C.P,  Pulmonary and Critical Care Medicine Staff Physician, [place] Director - Interstitial Lung Disease  Program  Medical Director - [facility] [place] at [place], [place], [address]  NPI Number:  NPI #[id number] [facility] Number: [id number]  Pager: [phone], If no answer  -> Check AMION or Try [phone] Telephone (clinical office): [phone] Telephone (research): [phone]  2:27 AM 07/03/2022       Latest Ref Rng & Units 08/23/2021    1:33 PM 02/14/2018    9:14 AM 09/13/2017    9:05 AM 06/08/2017    8:48 AM 08/16/2016   10:21 AM 03/04/2016   12:53 PM 08/28/2015    4:37 PM  PFT Results  FVC-Pre L 2.60  2.44  2.45  2.63  2.69  2.79  2.81   FVC-Predicted Pre % 78  70  70  75  76  79  79   FVC-Post L      2.84  2.75   FVC-Predicted Post %      81  77   Pre FEV1/FVC % % 80  78  77  75  79  75  75   Post FEV1/FCV % %      77  79   FEV1-Pre L 2.09  1.89  1.90  1.97  2.11  2.10  2.10   FEV1-Predicted Pre % 83  72  72  74  79  78  77   FEV1-Post L      2.20  2.16   DLCO uncorrected ml/min/mmHg 12.64  12.16  12.31  13.31  14.10  14.76  15.56   DLCO UNC% % 58  41  42  45  48  50  53   DLCO corrected ml/min/mmHg 12.64   12.35  13.43  13.90  15.20    DLCO COR %Predicted % 58   42  46  47  52    DLVA Predicted % 80  64  68  63  61  66  70   TLC L      4.85  4.11   TLC % Predicted %      86  73   RV % Predicted %      84  59

## 2022-07-04 DIAGNOSIS — B0089 Other herpesviral infection: Secondary | ICD-10-CM | POA: Diagnosis not present

## 2022-07-04 DIAGNOSIS — N1831 Chronic kidney disease, stage 3a: Secondary | ICD-10-CM | POA: Diagnosis not present

## 2022-07-11 DIAGNOSIS — J441 Chronic obstructive pulmonary disease with (acute) exacerbation: Secondary | ICD-10-CM | POA: Diagnosis not present

## 2022-07-12 NOTE — Telephone Encounter (Signed)
Appt Oct 3 at 10:30am and 11am

## 2022-07-25 DIAGNOSIS — J449 Chronic obstructive pulmonary disease, unspecified: Secondary | ICD-10-CM | POA: Diagnosis not present

## 2022-08-03 DIAGNOSIS — J84112 Idiopathic pulmonary fibrosis: Secondary | ICD-10-CM | POA: Diagnosis not present

## 2022-08-03 DIAGNOSIS — Z79899 Other long term (current) drug therapy: Secondary | ICD-10-CM | POA: Diagnosis not present

## 2022-08-03 DIAGNOSIS — I700 Atherosclerosis of aorta: Secondary | ICD-10-CM | POA: Diagnosis not present

## 2022-08-03 DIAGNOSIS — J439 Emphysema, unspecified: Secondary | ICD-10-CM | POA: Diagnosis not present

## 2022-08-03 DIAGNOSIS — H353131 Nonexudative age-related macular degeneration, bilateral, early dry stage: Secondary | ICD-10-CM | POA: Diagnosis not present

## 2022-08-03 DIAGNOSIS — J449 Chronic obstructive pulmonary disease, unspecified: Secondary | ICD-10-CM | POA: Diagnosis not present

## 2022-08-03 DIAGNOSIS — Z0000 Encounter for general adult medical examination without abnormal findings: Secondary | ICD-10-CM | POA: Diagnosis not present

## 2022-08-03 DIAGNOSIS — E1169 Type 2 diabetes mellitus with other specified complication: Secondary | ICD-10-CM | POA: Diagnosis not present

## 2022-08-03 DIAGNOSIS — I10 Essential (primary) hypertension: Secondary | ICD-10-CM | POA: Diagnosis not present

## 2022-08-03 DIAGNOSIS — I2510 Atherosclerotic heart disease of native coronary artery without angina pectoris: Secondary | ICD-10-CM | POA: Diagnosis not present

## 2022-08-03 DIAGNOSIS — F330 Major depressive disorder, recurrent, mild: Secondary | ICD-10-CM | POA: Diagnosis not present

## 2022-08-03 DIAGNOSIS — E1122 Type 2 diabetes mellitus with diabetic chronic kidney disease: Secondary | ICD-10-CM | POA: Diagnosis not present

## 2022-08-03 DIAGNOSIS — E785 Hyperlipidemia, unspecified: Secondary | ICD-10-CM | POA: Diagnosis not present

## 2022-08-16 ENCOUNTER — Encounter: Payer: PPO | Admitting: Internal Medicine

## 2022-08-16 ENCOUNTER — Telehealth: Payer: Self-pay | Admitting: Internal Medicine

## 2022-08-16 NOTE — Patient Instructions (Signed)
Pulmonary emphysema with fibrosis of lung ([place]) Alpha-1-antitrypsin deficiency ([place]) Pulmonary emphysema, unspecified emphysema type ([place]) IPF (idiopathic pulmonary fibrosis) ([place])  - you had covid feb 2021 and July 2022 - stiolto has helped - PFT similar to 2018 levesl and back up but still worse tha 2016   - Plan - keep HRCT upcoming appointment - continue stiolto scheduled - continue prolastin scheduled - respect flu shot deferral - cpap without o2 for your OSA  [facility]  - glad you saw Dr Rosendo Gros  PLan  - CT scan per him and followup per him  Followup  - 9 months do spirometyr and dlco  - return in 9 months or sooner if needed

## 2022-08-16 NOTE — Progress Notes (Signed)
 This encounter was created in error - please disregard.

## 2022-08-17 ENCOUNTER — Other Ambulatory Visit (HOSPITAL_COMMUNITY): Payer: Self-pay

## 2022-08-17 NOTE — Telephone Encounter (Signed)
Fax was received. MR put a message on form for pharmacy team to review. Routing to prior [place] team.

## 2022-08-17 NOTE — Telephone Encounter (Signed)
Called [employer] and spoke with someone who is going to relay message to care team for Angela [name]. Test claims show a $22.93 co-pay so trying to see if there was a reason behind the request to change direct from the patient or if maybe they have a different insurance card they are using. Gave them my number to contact until 4:30pm today and office number if calling after.

## 2022-09-22 DIAGNOSIS — Z8673 Personal history of transient ischemic attack (TIA), and cerebral infarction without residual deficits: Secondary | ICD-10-CM | POA: Diagnosis not present

## 2022-09-22 DIAGNOSIS — I10 Essential (primary) hypertension: Secondary | ICD-10-CM | POA: Diagnosis not present

## 2022-09-22 DIAGNOSIS — G4733 Obstructive sleep apnea (adult) (pediatric): Secondary | ICD-10-CM | POA: Diagnosis not present

## 2022-10-05 ENCOUNTER — Telehealth: Payer: Self-pay | Admitting: *Deleted

## 2022-10-05 NOTE — Telephone Encounter (Signed)
BI paperwork for patient assistance for Stiolto.  Paperwork completed and faxed to [facility] patient assistance, received fax that it was sent successfully.

## 2022-10-10 DIAGNOSIS — J449 Chronic obstructive pulmonary disease, unspecified: Secondary | ICD-10-CM | POA: Diagnosis not present

## 2022-10-10 DIAGNOSIS — N183 Chronic kidney disease, stage 3 unspecified: Secondary | ICD-10-CM | POA: Diagnosis not present

## 2022-10-10 DIAGNOSIS — I10 Essential (primary) hypertension: Secondary | ICD-10-CM | POA: Diagnosis not present

## 2022-10-10 DIAGNOSIS — I2510 Atherosclerotic heart disease of native coronary artery without angina pectoris: Secondary | ICD-10-CM | POA: Diagnosis not present

## 2022-10-10 DIAGNOSIS — E1122 Type 2 diabetes mellitus with diabetic chronic kidney disease: Secondary | ICD-10-CM | POA: Diagnosis not present

## 2022-10-10 DIAGNOSIS — F330 Major depressive disorder, recurrent, mild: Secondary | ICD-10-CM | POA: Diagnosis not present

## 2022-10-10 DIAGNOSIS — E785 Hyperlipidemia, unspecified: Secondary | ICD-10-CM | POA: Diagnosis not present

## 2022-10-10 DIAGNOSIS — J439 Emphysema, unspecified: Secondary | ICD-10-CM | POA: Diagnosis not present

## 2022-10-11 DIAGNOSIS — M25562 Pain in left knee: Secondary | ICD-10-CM | POA: Diagnosis not present

## 2022-10-14 DIAGNOSIS — M1712 Unilateral primary osteoarthritis, left knee: Secondary | ICD-10-CM | POA: Diagnosis not present

## 2022-12-16 ENCOUNTER — Encounter: Payer: Self-pay | Admitting: Pharmacist

## 2022-12-16 NOTE — Telephone Encounter (Signed)
Received request from [employer]in [employer] for renewal of Prolastin order.

## 2022-12-19 ENCOUNTER — Telehealth: Payer: Self-pay | Admitting: Pharmacist

## 2022-12-19 NOTE — Telephone Encounter (Signed)
Received fax from [employer] for order renewal. Completed and faxed with clinicals  Fax [phone] Phone: [phone]  Knox Saliva, PharmD, MPH, BCPS, CPP Clinical Pharmacist (Rheumatology and Pulmonology)

## 2022-12-21 ENCOUNTER — Emergency Department (HOSPITAL_COMMUNITY): Payer: PPO

## 2022-12-21 ENCOUNTER — Emergency Department (HOSPITAL_COMMUNITY)
Admission: EM | Admit: 2022-12-21 | Discharge: 2022-12-21 | Disposition: A | Discharge status: Home / Self Care | Payer: PPO | Attending: Emergency Medicine | Admitting: Emergency Medicine

## 2022-12-21 ENCOUNTER — Other Ambulatory Visit: Payer: Self-pay

## 2022-12-21 DIAGNOSIS — Z23 Encounter for immunization: Secondary | ICD-10-CM | POA: Insufficient documentation

## 2022-12-21 DIAGNOSIS — S01111A Laceration without foreign body of right eyelid and periocular area, initial encounter: Secondary | ICD-10-CM | POA: Diagnosis not present

## 2022-12-21 DIAGNOSIS — Z7984 Long term (current) use of oral hypoglycemic drugs: Secondary | ICD-10-CM | POA: Diagnosis not present

## 2022-12-21 DIAGNOSIS — Z79899 Other long term (current) drug therapy: Secondary | ICD-10-CM | POA: Insufficient documentation

## 2022-12-21 DIAGNOSIS — W010XXA Fall on same level from slipping, tripping and stumbling without subsequent striking against object, initial encounter: Secondary | ICD-10-CM | POA: Insufficient documentation

## 2022-12-21 DIAGNOSIS — R110 Nausea: Secondary | ICD-10-CM | POA: Diagnosis not present

## 2022-12-21 DIAGNOSIS — Y92002 Bathroom of unspecified non-institutional (private) residence single-family (private) house as the place of occurrence of the external cause: Secondary | ICD-10-CM | POA: Diagnosis not present

## 2022-12-21 DIAGNOSIS — W19XXXA Unspecified fall, initial encounter: Secondary | ICD-10-CM

## 2022-12-21 DIAGNOSIS — S0101XA Laceration without foreign body of scalp, initial encounter: Secondary | ICD-10-CM | POA: Diagnosis not present

## 2022-12-21 DIAGNOSIS — S0181XA Laceration without foreign body of other part of head, initial encounter: Secondary | ICD-10-CM | POA: Diagnosis not present

## 2022-12-21 DIAGNOSIS — S0990XA Unspecified injury of head, initial encounter: Secondary | ICD-10-CM | POA: Diagnosis not present

## 2022-12-21 DIAGNOSIS — I10 Essential (primary) hypertension: Secondary | ICD-10-CM | POA: Diagnosis not present

## 2022-12-21 DIAGNOSIS — Z043 Encounter for examination and observation following other accident: Secondary | ICD-10-CM | POA: Diagnosis not present

## 2022-12-21 LAB — CBC WITH DIFFERENTIAL/PLATELET
Abs Immature Granulocytes: 0.06 10*3/uL (ref 0.00–0.07)
Basophils Absolute: 0.1 10*3/uL (ref 0.0–0.1)
Basophils Relative: 1 %
Eosinophils Absolute: 0.3 10*3/uL (ref 0.0–0.5)
Eosinophils Relative: 2 %
HCT: 37.2 % (ref 36.0–46.0)
Hemoglobin: 12.6 g/dL (ref 12.0–15.0)
Immature Granulocytes: 0 %
Lymphocytes Relative: 15 %
Lymphs Abs: 2.2 10*3/uL (ref 0.7–4.0)
MCH: 29.6 pg (ref 26.0–34.0)
MCHC: 33.9 g/dL (ref 30.0–36.0)
MCV: 87.3 fL (ref 80.0–100.0)
Monocytes Absolute: 1.1 10*3/uL — ABNORMAL HIGH (ref 0.1–1.0)
Monocytes Relative: 8 %
Neutro Abs: 10.7 10*3/uL — ABNORMAL HIGH (ref 1.7–7.7)
Neutrophils Relative %: 74 %
Platelets: 319 10*3/uL (ref 150–400)
RBC: 4.26 MIL/uL (ref 3.87–5.11)
RDW: 13.5 % (ref 11.5–15.5)
WBC: 14.4 10*3/uL — ABNORMAL HIGH (ref 4.0–10.5)
nRBC: 0 % (ref 0.0–0.2)

## 2022-12-21 LAB — BASIC METABOLIC PANEL
Anion gap: 13 (ref 5–15)
BUN: 20 mg/dL (ref 8–23)
CO2: 21 mmol/L — ABNORMAL LOW (ref 22–32)
Calcium: 9.4 mg/dL (ref 8.9–10.3)
Chloride: 99 mmol/L (ref 98–111)
Creatinine, Ser: 1.25 mg/dL — ABNORMAL HIGH (ref 0.44–1.00)
GFR, Estimated: 45 mL/min — ABNORMAL LOW (ref >60)
Glucose, Bld: 129 mg/dL — ABNORMAL HIGH (ref 70–99)
Potassium: 4.4 mmol/L (ref 3.5–5.1)
Sodium: 133 mmol/L — ABNORMAL LOW (ref 135–145)

## 2022-12-21 MED ORDER — TETANUS-DIPHTH-ACELL PERTUSSIS 5-2.5-18.5 LF-MCG/0.5 IM SUSY
0.5000 mL | PREFILLED_SYRINGE | Freq: Once | INTRAMUSCULAR | Status: AC
  Administered 2022-12-21: 0.5 mL via INTRAMUSCULAR
  Filled 2022-12-21: qty 0.5

## 2022-12-21 MED ORDER — LIDOCAINE-EPINEPHRINE-TETRACAINE (LET) TOPICAL GEL
3.0000 mL | Freq: Once | TOPICAL | Status: AC
Start: 2022-12-21 — End: 2022-12-21
  Administered 2022-12-21: 3 mL via TOPICAL
  Filled 2022-12-21: qty 3

## 2022-12-21 NOTE — ED Triage Notes (Signed)
Patient BIB EMS for evaluation of head injury s/p falling this morning while going to bathroom.  Reports she lost her balance and fell.  No reports of LOC.  Laceration noted to forehead.  No blood thinners.  Patient is alert and oriented.  C/o HA

## 2022-12-21 NOTE — ED Provider Notes (Signed)
[facility] Provider Note   CSN: [id number] Arrival date & time: 12/21/22  8144     History  Chief Complaint  Patient presents with   Angela [name]    Angela [name] is a 77 y.o. female.  77 year old female who presents the ER today after a fall with a laceration to her forehead.  No blood thinners.  She states that she got up to go to the bathroom and felt a little "wobbly headed" and fell down in the bathroom.  She is not sure what she hit her head on.  She was able to stand up and get around and then call EMS and came here for further evaluation.  Was initially hurt a little bit in her neck but that seems to have improved.  Now she is has a headache.  She is also thirsty.  No loss of consciousness.  No injuries elsewhere.   Fall       Home Medications Prior to Admission medications   Medication Sig Start Date End Date Taking? Authorizing Provider  acetaminophen (TYLENOL) 500 MG tablet Take 500 mg by mouth every 6 (six) hours as needed.    [provider]  albuterol (PROVENTIL) (2.5 MG/3ML) 0.083% nebulizer solution Take 3 mLs (2.5 mg total) by nebulization every 6 (six) hours as needed for wheezing or shortness of breath. 01/29/21   Parrett, Fonnie Mu, NP  amoxicillin-clavulanate (AUGMENTIN) 875-125 MG tablet Take 1 tablet by mouth 2 (two) times daily. 04/04/22   Parrett, Fonnie Mu, NP  Bacillus Coagulans-Inulin (PROBIOTIC-PREBIOTIC PO) Take 2 tablets by mouth in the morning.    [provider]  brimonidine (ALPHAGAN) 0.15 % ophthalmic solution Place 1 drop into both eyes in the morning and at bedtime. 07/12/17   [provider]  buPROPion (WELLBUTRIN XL) 300 MG 24 hr tablet Take 300 mg by mouth in the morning. 05/04/16   [provider]  Camphor-Eucalyptus-Menthol (VICKS VAPORUB) 4.73-1.2-2.6 % OINT Apply 1 application topically at bedtime.    [provider]  cyclobenzaprine (FLEXERIL) 10 MG tablet Take 10  mg by mouth at bedtime as needed for muscle spasms.  06/09/15   [provider]  dimethicone (REMEDY NUTRASHIELD) 1 % cream Apply 1 application topically 2 (two) times daily as needed for dry skin.    [provider]  glipiZIDE (GLUCOTROL XL) 5 MG 24 hr tablet Take 5 mg by mouth every evening. 11/21/17   [provider]  hydrochlorothiazide (HYDRODIURIL) 12.5 MG tablet Take 12.5 mg by mouth every morning. 08/30/21   [provider]  latanoprost (XALATAN) 0.005 % ophthalmic solution Place 1 drop into the left eye at bedtime. Left eye    [provider]  losartan (COZAAR) 50 MG tablet Take 50 mg by mouth in the morning. 11/24/19   [provider]  meclizine (ANTIVERT) 12.5 MG tablet Take 1 tablet (12.5 mg total) by mouth 3 (three) times daily as needed for dizziness. 12/16/18   Palumbo, April, MD  Menthol (ICY HOT) 5 % [facility] Place 1 patch onto the skin daily as needed (back pain.).    [provider]  Menthol, Topical Analgesic, (BIOFREEZE EX) Apply 1 application topically 3 (three) times daily as needed (back pain.).    [provider]  metFORMIN (GLUCOPHAGE-XR) 500 MG 24 hr tablet Take 1,000 mg by mouth in the morning and at bedtime. 01/25/18   [provider]  metoprolol succinate (TOPROL-XL) 25 MG 24 hr tablet TAKE  1 TABLET(25 MG) BY MOUTH DAILY 05/22/17   Jerline Pain, MD  Multiple Vitamin (MULTIVITAMIN WITH MINERALS) TABS tablet Take 1 tablet by mouth in the morning.    [provider]  omeprazole (PRILOSEC) 40 MG capsule TAKE 1 CAPSULE(40 MG) BY MOUTH TWICE DAILY BEFORE LUNCH AND SUPPER 05/04/20   Mansouraty, Telford Nab., MD  OVER THE COUNTER MEDICATION Take 1 capsule by mouth daily as needed (congestion). Serrapeptase Capsules    [provider]  predniSONE (DELTASONE) 10 MG tablet 4 tabs for 2 days, then 3 tabs for 2 days, 2 tabs for 2 days, then 1 tab for 2 days, then stop 04/04/22   Parrett, Tammy S, NP   PROLASTIN-C 1000 MG/20ML SOLN Inject 1,000 mg into the vein every Tuesday. 09/14/21   [provider]  rosuvastatin (CRESTOR) 5 MG tablet Take 5 mg by mouth daily.    [provider]  sertraline (ZOLOFT) 50 MG tablet Take 50 mg by mouth in the morning.    [provider]  Tiotropium Bromide-Olodaterol (STIOLTO RESPIMAT) 2.5-2.5 MCG/ACT AERS Inhale 2 puffs into the lungs daily. 01/20/21   Parrett, Fonnie Mu, NP  vitamin B-12 (CYANOCOBALAMIN) 1000 MCG tablet Take 1,000 mcg by mouth in the morning.    [provider]      Allergies    Tizanidine, Influenza vaccines, Other, Vicodin hp [hydrocodone-acetaminophen], Betadine [povidone iodine], Ciprofloxacin, Codeine, and Iohexol    Review of Systems   Review of Systems  Physical Exam Updated Vital Signs BP (!) 188/71 (BP Location: Left Arm)   Pulse 66   Temp 97.8 F (36.6 C) (Oral)   Resp 18   LMP  (LMP Unknown)   SpO2 95%  Physical Exam Vitals and nursing note reviewed.  Constitutional:      Appearance: She is well-developed.  HENT:     Head: Normocephalic.     Comments: Triangular shaped laceration above right eye, hemostatic, well approximated. Cardiovascular:     Rate and Rhythm: Normal rate and regular rhythm.  Pulmonary:     Effort: No respiratory distress.     Breath sounds: No stridor.  Abdominal:     General: There is no distension.  Musculoskeletal:     Cervical back: Normal range of motion.  Skin:    General: Skin is warm and dry.  Neurological:     Mental Status: She is alert.     ED Results / Procedures / Treatments   Labs (all labs ordered are listed, but only abnormal results are displayed) Labs Reviewed  CBC WITH DIFFERENTIAL/PLATELET - Abnormal; Notable for the following components:      Result Value   WBC 14.4 (*)    Neutro Abs 10.7 (*)    Monocytes Absolute 1.1 (*)    All other components within normal limits  BASIC METABOLIC PANEL - Abnormal; Notable for the  following components:   Sodium 133 (*)    CO2 21 (*)    Glucose, Bld 129 (*)    Creatinine, Ser 1.25 (*)    GFR, Estimated 45 (*)    All other components within normal limits    EKG None  Radiology CT Cervical Spine Wo Contrast  Result Date: 12/21/2022 CLINICAL DATA:  77 year old female status post fall while going to bathroom this morning. EXAM: CT CERVICAL SPINE WITHOUT CONTRAST TECHNIQUE: Multidetector CT imaging of the cervical spine was performed without intravenous contrast. Multiplanar CT image reconstructions were also generated. RADIATION DOSE REDUCTION: This exam was performed according to  the departmental dose-optimization program which includes automated exposure control, adjustment of the mA and/or kV according to patient size and/or use of iterative reconstruction technique. COMPARISON:  Head CT today reported separately. Cervical spine CT 03/13/2015. FINDINGS: Alignment: Mildly increased reversal of cervical lordosis since 2016. Cervicothoracic junction alignment is within normal limits. Bilateral posterior element alignment is within normal limits. Skull base and vertebrae: Postoperative details are below. Visualized skull base is intact. No atlanto-occipital dissociation. C1 and C2 appear intact and aligned. No acute osseous abnormality identified. Soft tissues and spinal canal: No prevertebral fluid or swelling. No visible canal hematoma. Negative noncontrast neck soft tissues aside from mild postoperative changes. Disc levels: Chronic C5-C6 and C6-C7 ACDF with progressed and solid arthrodesis since 2016. Superimposed new posterior spinal fusion hardware C6 through T1. C6-C7 posterior element arthrodesis. No convincing C7-T1 fusion. But no hardware loosening identified. Bulky chronic C4-C5 disc and endplate degeneration. But no convincing upper cervical spinal stenosis. Upper chest: Negative visible noncontrast thoracic inlet. IMPRESSION: 1. No acute traumatic injury identified in the  cervical spine. 2. Chronic C5-C6 and C6-C7 ACDF with solid arthrodesis. 3. New since 2016 posterior spinal fusion hardware C6 through T1. Absent fusion at C7-T1, but no hardware loosening identified. Electronically Signed   By: Genevie Ann M.D.   On: 12/21/2022 05:49   CT Head Wo Contrast  Result Date: 12/21/2022 CLINICAL DATA:  77 year old female status post fall while going to bathroom this morning. EXAM: CT HEAD WITHOUT CONTRAST TECHNIQUE: Contiguous axial images were obtained from the base of the skull through the vertex without intravenous contrast. RADIATION DOSE REDUCTION: This exam was performed according to the departmental dose-optimization program which includes automated exposure control, adjustment of the mA and/or kV according to patient size and/or use of iterative reconstruction technique. COMPARISON:  Head CT 11/28/2021.  Brain MRI 07/10/2014. FINDINGS: Brain: Cerebral volume is not significantly changed since 2023. No midline shift, ventriculomegaly, mass effect, evidence of mass lesion, intracranial hemorrhage or evidence of cortically based acute infarction. Chronic patchy bilateral cerebral white matter hypodensity, moderate for age. Mild associated chronic heterogeneity of the bilateral deep gray nuclei. Stable gray-white matter differentiation throughout the brain. Vascular: Mild Calcified atherosclerosis at the skull base. No suspicious intracranial vascular hyperdensity. Skull: Stable and intact. Sinuses/Orbits: Visualized paranasal sinuses and mastoids are stable and well aerated. Chronic left maxillary periosteal thickening. Other: Right anterior vertex scalp soft tissue gas with nearby anterior superior convexity laceration (series 4, image 53). No measurable scalp hematoma. Orbits soft tissues appears stable and intact. IMPRESSION: 1. Right anterior vertex scalp laceration. Tracking soft tissue gas but no underlying skull fracture. 2. No acute intracranial abnormality. Stable non contrast  CT appearance of chronic white matter disease since last year. Electronically Signed   By: Genevie Ann M.D.   On: 12/21/2022 05:43    Procedures .Marland KitchenLaceration Repair  Date/Time: 12/21/2022 6:51 AM  Performed by: Merrily Pew, MD Authorized by: Merrily Pew, MD   Consent:    Consent obtained:  Verbal   Consent given by:  Patient   Risks, benefits, and alternatives were discussed: yes     Risks discussed:  Infection, pain, poor cosmetic result and poor wound healing   Alternatives discussed:  No treatment, delayed treatment, observation and referral Universal protocol:    Procedure explained and questions answered to patient or proxy's satisfaction: yes     Test results available: yes     Imaging studies available: yes     Patient identity confirmed:  Verbally with  patient Anesthesia:    Anesthesia method:  Topical application   Topical anesthetic:  LET Laceration details:    Location:  Face   Face location:  Forehead   Length (cm):  4   Depth (mm):  8 Pre-procedure details:    Preparation:  Patient was prepped and draped in usual sterile fashion Exploration:    Imaging obtained comment:  CT   Wound exploration: wound explored through full range of motion   Treatment:    Area cleansed with:  Saline   Amount of cleaning:  Standard   Irrigation volume:  50   Irrigation method:  Syringe   Debridement:  None   Undermining:  None Skin repair:    Repair method:  Sutures   Suture size:  4-0   Wound skin closure material used: vicryl rapide.   Suture technique:  Simple interrupted   Number of sutures:  7 Repair type:    Repair type:  Simple Post-procedure details:    Dressing:  Antibiotic ointment   Procedure completion:  Tolerated     Medications Ordered in ED Medications  lidocaine-EPINEPHrine-tetracaine (LET) topical gel (3 mLs Topical Given 12/21/22 0537)  Tdap (BOOSTRIX) injection 0.5 mL (0.5 mLs Intramuscular Given 12/21/22 0555)    ED Course/ Medical Decision Making/  A&P                           Medical Decision Making Amount and/or Complexity of Data Reviewed Labs: ordered. Radiology: ordered.  Risk Prescription drug management.  CT head done and showed no obvious bleed (independently viewed and interpreted by myself and radiology read reviewed). CT neck done and showed no obvious acute bony injury (independently viewed and interpreted by myself and radiology read reviewed).  Wound repaired as above.  No evidence of intracranial injury.  No evidence of injury elsewhere.  Overall patient appears well tolerating p.o.  Stable for discharge home. Will fu w/ PCP or return here in 7 days if sutures don't absorb. S/s infection discussed and the need for immediate return in the event htat takes place.    Final Clinical Impression(s) / ED Diagnoses Final diagnoses:  Fall, initial encounter  Facial laceration, initial encounter    Rx / DC Orders ED Discharge Orders     None         Cori Justus, Corene Cornea, MD 12/21/22 [phone]

## 2022-12-21 NOTE — ED Notes (Addendum)
Patient Alert and oriented to baseline. Stable and ambulatory to baseline. Patient verbalized understanding of the discharge instructions.  Patient belongings were taken by the patient.   

## 2022-12-22 DIAGNOSIS — N183 Chronic kidney disease, stage 3 unspecified: Secondary | ICD-10-CM | POA: Diagnosis not present

## 2022-12-22 DIAGNOSIS — F330 Major depressive disorder, recurrent, mild: Secondary | ICD-10-CM | POA: Diagnosis not present

## 2022-12-22 DIAGNOSIS — J449 Chronic obstructive pulmonary disease, unspecified: Secondary | ICD-10-CM | POA: Diagnosis not present

## 2022-12-22 DIAGNOSIS — I2510 Atherosclerotic heart disease of native coronary artery without angina pectoris: Secondary | ICD-10-CM | POA: Diagnosis not present

## 2022-12-22 DIAGNOSIS — E785 Hyperlipidemia, unspecified: Secondary | ICD-10-CM | POA: Diagnosis not present

## 2022-12-22 DIAGNOSIS — J439 Emphysema, unspecified: Secondary | ICD-10-CM | POA: Diagnosis not present

## 2022-12-22 DIAGNOSIS — I10 Essential (primary) hypertension: Secondary | ICD-10-CM | POA: Diagnosis not present

## 2022-12-22 DIAGNOSIS — E1122 Type 2 diabetes mellitus with diabetic chronic kidney disease: Secondary | ICD-10-CM | POA: Diagnosis not present

## 2022-12-28 DIAGNOSIS — S0181XD Laceration without foreign body of other part of head, subsequent encounter: Secondary | ICD-10-CM | POA: Diagnosis not present

## 2022-12-28 DIAGNOSIS — W19XXXD Unspecified fall, subsequent encounter: Secondary | ICD-10-CM | POA: Diagnosis not present

## 2022-12-28 DIAGNOSIS — S060X9D Concussion with loss of consciousness of unspecified duration, subsequent encounter: Secondary | ICD-10-CM | POA: Diagnosis not present

## 2022-12-28 DIAGNOSIS — F330 Major depressive disorder, recurrent, mild: Secondary | ICD-10-CM | POA: Diagnosis not present

## 2022-12-28 DIAGNOSIS — N390 Urinary tract infection, site not specified: Secondary | ICD-10-CM | POA: Diagnosis not present

## 2022-12-28 DIAGNOSIS — I10 Essential (primary) hypertension: Secondary | ICD-10-CM | POA: Diagnosis not present

## 2023-01-03 DIAGNOSIS — R042 Hemoptysis: Secondary | ICD-10-CM | POA: Diagnosis not present

## 2023-01-03 DIAGNOSIS — R509 Fever, unspecified: Secondary | ICD-10-CM | POA: Diagnosis not present

## 2023-01-03 DIAGNOSIS — N390 Urinary tract infection, site not specified: Secondary | ICD-10-CM | POA: Diagnosis not present

## 2023-01-04 DIAGNOSIS — N390 Urinary tract infection, site not specified: Secondary | ICD-10-CM | POA: Diagnosis not present

## 2023-01-25 ENCOUNTER — Telehealth: Payer: Self-pay | Admitting: Internal Medicine

## 2023-01-25 DIAGNOSIS — N183 Chronic kidney disease, stage 3 unspecified: Secondary | ICD-10-CM | POA: Diagnosis not present

## 2023-01-25 DIAGNOSIS — E1122 Type 2 diabetes mellitus with diabetic chronic kidney disease: Secondary | ICD-10-CM | POA: Diagnosis not present

## 2023-01-25 DIAGNOSIS — I10 Essential (primary) hypertension: Secondary | ICD-10-CM | POA: Diagnosis not present

## 2023-01-25 DIAGNOSIS — J439 Emphysema, unspecified: Secondary | ICD-10-CM | POA: Diagnosis not present

## 2023-01-25 DIAGNOSIS — E785 Hyperlipidemia, unspecified: Secondary | ICD-10-CM | POA: Diagnosis not present

## 2023-01-25 DIAGNOSIS — J449 Chronic obstructive pulmonary disease, unspecified: Secondary | ICD-10-CM | POA: Diagnosis not present

## 2023-01-25 DIAGNOSIS — I2510 Atherosclerotic heart disease of native coronary artery without angina pectoris: Secondary | ICD-10-CM | POA: Diagnosis not present

## 2023-01-25 DIAGNOSIS — F330 Major depressive disorder, recurrent, mild: Secondary | ICD-10-CM | POA: Diagnosis not present

## 2023-01-25 NOTE — Telephone Encounter (Signed)
Pharm calling. She has been prescribed Stiolto and it is too expensive. Pharm recommends Bevesti but wanted to check w/Dr. R. .  She is out of All her inhalers and needs samples if poss. Pls call PT @ [phone]  Any quest w/Pharm call Legrand [facility] @ [phone]

## 2023-01-26 MED ORDER — TIOTROPIUM BROMIDE-OLODATEROL 2.5-2.5 MCG/ACT IN AERS: 2.0000 | INHALATION_SPRAY | Freq: Every day | RESPIRATORY_TRACT | 0 refills | Status: DC

## 2023-01-26 MED ORDER — BEVESPI AEROSPHERE 9-4.8 MCG/ACT IN AERO: 2.0000 | INHALATION_SPRAY | Freq: Two times a day (BID) | RESPIRATORY_TRACT | 5 refills | Status: DC

## 2023-01-26 NOTE — Telephone Encounter (Signed)
Ok to chagne to [employer]

## 2023-01-26 NOTE — Telephone Encounter (Signed)
Called and spoke with pt. Angela [name] wants to know if she will be able to switch to [facility] since its more affordable. I was able to supply Angela [name] with 2 samples of stiolto until we figure this situation out. Dr. MR please advise

## 2023-01-26 NOTE — Telephone Encounter (Signed)
Called and spoke with patient. She verbalized understanding. RX has been sent. Nothing further needed at time of call.  

## 2023-02-01 DIAGNOSIS — G4733 Obstructive sleep apnea (adult) (pediatric): Secondary | ICD-10-CM | POA: Diagnosis not present

## 2023-02-01 DIAGNOSIS — Z8673 Personal history of transient ischemic attack (TIA), and cerebral infarction without residual deficits: Secondary | ICD-10-CM | POA: Diagnosis not present

## 2023-02-01 DIAGNOSIS — I10 Essential (primary) hypertension: Secondary | ICD-10-CM | POA: Diagnosis not present

## 2023-02-14 ENCOUNTER — Other Ambulatory Visit (HOSPITAL_COMMUNITY): Payer: Self-pay

## 2023-02-14 ENCOUNTER — Telehealth: Payer: Self-pay

## 2023-02-14 NOTE — Telephone Encounter (Signed)
PA request received via CMM for Bevespi Aerosphere 9-4.8MCG/ACT aerosol  PA has been submitted to [place] [employer] and is pending determination  Key: [id number]

## 2023-02-17 NOTE — Telephone Encounter (Signed)
PA has been APPROVED from 02/14/2023-11/14/2023  Approval letter has been attached in patients documents

## 2023-04-28 DIAGNOSIS — J069 Acute upper respiratory infection, unspecified: Secondary | ICD-10-CM | POA: Diagnosis not present

## 2023-05-01 ENCOUNTER — Ambulatory Visit: Payer: PPO | Admitting: Adult Health

## 2023-05-01 ENCOUNTER — Encounter: Payer: Self-pay | Admitting: Adult Health

## 2023-05-01 VITALS — BP 144/50 | HR 81 | Temp 98.2°F | Ht 67.5 in | Wt 189.8 lb

## 2023-05-01 DIAGNOSIS — J441 Chronic obstructive pulmonary disease with (acute) exacerbation: Secondary | ICD-10-CM

## 2023-05-01 DIAGNOSIS — J849 Interstitial pulmonary disease, unspecified: Secondary | ICD-10-CM

## 2023-05-01 DIAGNOSIS — E8801 Alpha-1-antitrypsin deficiency: Secondary | ICD-10-CM | POA: Diagnosis not present

## 2023-05-01 DIAGNOSIS — J84112 Idiopathic pulmonary fibrosis: Secondary | ICD-10-CM

## 2023-05-01 MED ORDER — ALBUTEROL SULFATE (2.5 MG/3ML) 0.083% IN NEBU: 2.5000 mg | INHALATION_SOLUTION | Freq: Four times a day (QID) | RESPIRATORY_TRACT | 5 refills | Status: AC | PRN

## 2023-05-01 NOTE — Patient Instructions (Addendum)
Finish Doxycycline and Prednisone as directed.  Mucinex DM As needed   Continue on Bivespi 2 puffs Twice daily   Albuterol inhaler/neb As needed   Restart weekly Prolastin infusion this week as planned.  Set up for HRCT chest to follow ILD.  Follow-up in 6 weeks with Spirometry with DLCO with Dr . Marchelle Gearing and as needed Please contact office for sooner follow up if symptoms do not improve or worsen or seek emergency care

## 2023-05-01 NOTE — Progress Notes (Unsigned)
@Patient  ID: Angela [name], female    DOB: [birth year], 77 y.o.   MRN: [id number]  Chief Complaint  Patient presents with   Follow-up    Referring provider: Shirlean Mylar, MD  HPI: 77 year old female former smoker followed for COPD with emphysema, MZ phenotype with low levels of alpha-1 on replacement therapy and idiopathic pulmonary fibrosis Medical history significant for obstructive sleep apnea on CPAP COVID-19 infection in 2021 and 2022  TEST/EVENTS :  High-resolution CT chest November 2020 showed stable ILD changes, probable UIP since 2018, emphysema   Pulmonary function test February 14, 2018 FEV1 72%, ratio 78, FVC 70%, and 41%   PFTs August 23, 2021 FEV1 83%, ratio 80, DLCO 58%   Trial of Esbriet in the past unable to tolerate due to GI side effects  High-res CT chest October 2022 shows ILD changes with probable UIP minimal progression, severe emphysema, severe tracheobronchomalacia  05/01/2023 Follow up: COPD , Alpha 1 MZ phenotype.  Patient returns for follow-up visit.  Last seen May 2023.  At that visit she was seen for COPD exacerbation treated with antibiotics and steroids.  Patient missed her follow-up in October with Dr. Marchelle Gearing.  Patient complains of Recent bronchitis doxy and steroids  Feelign better.  Off proloast due to insranc e, staring back thisd week , finally got approval   Weight is going down .    Allergies  Allergen Reactions   Tizanidine Other (See Comments)    Severe decrease in BP    Influenza Vaccines Other (See Comments)    becomes sick with the flu   Other     PATIENT STATES SHE CANNOT TAKE ANY VACCINATIONS PER HER PHYSICIAN   Vicodin Hp [Hydrocodone-Acetaminophen] Nausea And Vomiting   Betadine [Povidone Iodine] Rash   Ciprofloxacin Rash   Codeine Nausea And Vomiting   Iohexol      Desc: Pt states several years ago during a CT scan w/ iv cotnrast she had severe nausea and vomiting, sob w/ difficulty breathing and swallowing.  She had full  13hr. premeds today (5/14/7) and did fine w/o complications.     Immunization History  Administered Date(s) Administered   Influenza, High Dose Seasonal PF 09/13/2017, 08/14/2020   Moderna Sars-Covid-2 Vaccination 12/16/2019, 01/13/2020, 09/22/2020, 06/09/2021   Pneumococcal Conjugate-13 10/15/2014   Pneumococcal-Unspecified 11/14/2012   Tdap 09/05/2016, 12/21/2022   Zoster, Live 07/16/2021    Past Medical History:  Diagnosis Date   Allergy    Alpha 1-antitrypsin PiMS phenotype    Anemia 2014   mild   Anxiety    Arthritis    Asthma    Blood transfusion without reported diagnosis    Cataract    Chronic kidney disease    early stages of some insufficiency   Complication of anesthesia    passes out 24 hrs after getting   COPD (chronic obstructive pulmonary disease) (HCC)    DDD (degenerative disc disease)    Depression    with anxiety   Diabetes mellitus    Diverticulitis    Diverticulosis    Esophageal dysmotility 03/01/06   mild   Fibromyalgia    GERD (gastroesophageal reflux disease)    Glaucoma    Heart murmur    "years ago"   Hypercholesterolemia    Hypertension    IPF (idiopathic pulmonary fibrosis) (HCC)    Shingles    Shortness of breath dyspnea    Sleep apnea    wears CPAP   Stroke (HCC) 2010   3 tia's  Tobacco History: Social History   Tobacco Use  Smoking Status Former   Packs/day: 1.00   Years: 22.00   Additional pack years: 0.00   Total pack years: 22.00   Types: Cigarettes   Start date: 22   Quit date: 04/14/2012   Years since quitting: 11.0  Smokeless Tobacco Never   Counseling given: Not Answered   Outpatient Medications Prior to Visit  Medication Sig Dispense Refill   acetaminophen (TYLENOL) 500 MG tablet Take 500 mg by mouth every 6 (six) hours as needed.     albuterol (PROVENTIL) (2.5 MG/3ML) 0.083% nebulizer solution Take 3 mLs (2.5 mg total) by nebulization every 6 (six) hours as needed for wheezing or shortness of breath.  90 mL 5   Bacillus Coagulans-Inulin (PROBIOTIC-PREBIOTIC PO) Take 2 tablets by mouth in the morning.     benzonatate (TESSALON) 100 MG capsule Take 100 mg by mouth 3 (three) times daily as needed.     brimonidine (ALPHAGAN) 0.15 % ophthalmic solution Place 1 drop into both eyes in the morning and at bedtime.  3   buPROPion (WELLBUTRIN XL) 300 MG 24 hr tablet Take 300 mg by mouth in the morning.     Camphor-Eucalyptus-Menthol (VICKS VAPORUB) 4.73-1.2-2.6 % OINT Apply 1 application topically at bedtime.     cyclobenzaprine (FLEXERIL) 10 MG tablet Take 10 mg by mouth at bedtime as needed for muscle spasms.   0   dimethicone (REMEDY NUTRASHIELD) 1 % cream Apply 1 application topically 2 (two) times daily as needed for dry skin.     doxycycline (VIBRA-TABS) 100 MG tablet Take 100 mg by mouth 2 (two) times daily.     Glycopyrrolate-Formoterol (BEVESPI AEROSPHERE) 9-4.8 MCG/ACT AERO Inhale 2 puffs into the lungs 2 (two) times daily. 10.7 g 5   hydrochlorothiazide (HYDRODIURIL) 12.5 MG tablet Take 12.5 mg by mouth every morning.     latanoprost (XALATAN) 0.005 % ophthalmic solution Place 1 drop into the left eye at bedtime. Left eye     losartan (COZAAR) 50 MG tablet Take 50 mg by mouth in the morning.     meclizine (ANTIVERT) 12.5 MG tablet Take 1 tablet (12.5 mg total) by mouth 3 (three) times daily as needed for dizziness. 16 tablet 0   Menthol (ICY HOT) 5 % PTCH Place 1 patch onto the skin daily as needed (back pain.).     Menthol, Topical Analgesic, (BIOFREEZE EX) Apply 1 application topically 3 (three) times daily as needed (back pain.).     metFORMIN (GLUCOPHAGE-XR) 500 MG 24 hr tablet Take 1,000 mg by mouth in the morning and at bedtime.  11   metoprolol succinate (TOPROL-XL) 25 MG 24 hr tablet TAKE 1 TABLET(25 MG) BY MOUTH DAILY 90 tablet 1   Multiple Vitamin (MULTIVITAMIN WITH MINERALS) TABS tablet Take 1 tablet by mouth in the morning.     omeprazole (PRILOSEC) 40 MG capsule TAKE 1 CAPSULE(40  MG) BY MOUTH TWICE DAILY BEFORE LUNCH AND SUPPER 90 capsule 3   OVER THE COUNTER MEDICATION Take 1 capsule by mouth daily as needed (congestion). Serrapeptase Capsules     predniSONE (DELTASONE) 10 MG tablet 4 tabs for 2 days, then 3 tabs for 2 days, 2 tabs for 2 days, then 1 tab for 2 days, then stop 20 tablet 0   PROLASTIN-C 1000 MG/20ML SOLN Inject 1,000 mg into the vein every Tuesday.     rosuvastatin (CRESTOR) 5 MG tablet Take 5 mg by mouth daily.     sertraline (ZOLOFT) 50  MG tablet Take 50 mg by mouth in the morning.     Tiotropium Bromide-Olodaterol 2.5-2.5 MCG/ACT AERS Inhale 2 puffs into the lungs daily. 4 g 0   vitamin B-12 (CYANOCOBALAMIN) 1000 MCG tablet Take 1,000 mcg by mouth in the morning.     Tiotropium Bromide-Olodaterol (STIOLTO RESPIMAT) 2.5-2.5 MCG/ACT AERS Inhale 2 puffs into the lungs daily. (Patient not taking: Reported on 05/01/2023) 4 g 5   amoxicillin-clavulanate (AUGMENTIN) 875-125 MG tablet Take 1 tablet by mouth 2 (two) times daily. (Patient not taking: Reported on 05/01/2023) 14 tablet 0   glipiZIDE (GLUCOTROL XL) 5 MG 24 hr tablet Take 5 mg by mouth every evening. (Patient not taking: Reported on 05/01/2023)  11   No facility-administered medications prior to visit.     Review of Systems:   Constitutional:   No  weight loss, night sweats,  Fevers, chills, fatigue, or  lassitude.  HEENT:   No headaches,  Difficulty swallowing,  Tooth/dental problems, or  Sore throat,                No sneezing, itching, ear ache, nasal congestion, post nasal drip,   CV:  No chest pain,  Orthopnea, PND, swelling in lower extremities, anasarca, dizziness, palpitations, syncope.   GI  No heartburn, indigestion, abdominal pain, nausea, vomiting, diarrhea, change in bowel habits, loss of appetite, bloody stools.   Resp: No shortness of breath with exertion or at rest.  No excess mucus, no productive cough,  No non-productive cough,  No coughing up of blood.  No change in color of  mucus.  No wheezing.  No chest wall deformity  Skin: no rash or lesions.  GU: no dysuria, change in color of urine, no urgency or frequency.  No flank pain, no hematuria   MS:  No joint pain or swelling.  No decreased range of motion.  No back pain.    Physical Exam  BP (!) 144/50 (BP Location: Left Arm, Patient Position: Sitting, Cuff Size: Normal)   Pulse 81   Temp 98.2 F (36.8 C) (Oral)   Ht 5' 7.5" (1.715 m)   Wt 189 lb 12.8 oz (86.1 kg)   LMP  (LMP Unknown)   SpO2 91%   BMI 29.29 kg/m   GEN: A/Ox3; pleasant , NAD, well nourished    HEENT:  [place]/AT,  EACs-clear, TMs-wnl, NOSE-clear, THROAT-clear, no lesions, no postnasal drip or exudate noted.   NECK:  Supple w/ fair ROM; no JVD; normal carotid impulses w/o bruits; no thyromegaly or nodules palpated; no lymphadenopathy.    RESP  Clear  P & A; w/o, wheezes/ rales/ or rhonchi. no accessory muscle use, no dullness to percussion  CARD:  RRR, no m/r/g, no peripheral edema, pulses intact, no cyanosis or clubbing.  GI:   Soft & nt; nml bowel sounds; no organomegaly or masses detected.   [name]: Warm bil, no deformities or joint swelling noted.   Neuro: alert, no focal deficits noted.    Skin: Warm, no lesions or rashes    Lab Results:  CBC    Component Value Date/Time   WBC 14.4 (H) 12/21/2022 0547   RBC 4.26 12/21/2022 0547   HGB 12.6 12/21/2022 0547   HCT 37.2 12/21/2022 0547   PLT 319 12/21/2022 0547   MCV 87.3 12/21/2022 0547   MCH 29.6 12/21/2022 0547   MCHC 33.9 12/21/2022 0547   RDW 13.5 12/21/2022 0547   LYMPHSABS 2.2 12/21/2022 0547   MONOABS 1.1 (H) 12/21/2022 0547  EOSABS 0.3 12/21/2022 0547   BASOSABS 0.1 12/21/2022 0547    BMET    Component Value Date/Time   NA 133 (L) 12/21/2022 0547   K 4.4 12/21/2022 0547   CL 99 12/21/2022 0547   CO2 21 (L) 12/21/2022 0547   GLUCOSE 129 (H) 12/21/2022 0547   BUN 20 12/21/2022 0547   CREATININE 1.25 (H) 12/21/2022 0547   CALCIUM 9.4 12/21/2022 0547    GFRNONAA 45 (L) 12/21/2022 0547   GFRAA 43 (L) 12/29/2019 2355    BNP No results found for: "BNP"  ProBNP No results found for: "PROBNP"  Imaging: No results found.       Latest Ref Rng & Units 08/23/2021    1:33 PM 02/14/2018    9:14 AM 09/13/2017    9:05 AM 06/08/2017    8:48 AM 08/16/2016   10:21 AM 03/04/2016   12:53 PM 08/28/2015    4:37 PM  PFT Results  FVC-Pre L 2.60  2.44  2.45  2.63  2.69  2.79  2.81   FVC-Predicted Pre % 78  70  70  75  76  79  79   FVC-Post L      2.84  2.75   FVC-Predicted Post %      81  77   Pre FEV1/FVC % % 80  78  77  75  79  75  75   Post FEV1/FCV % %      77  79   FEV1-Pre L 2.09  1.89  1.90  1.97  2.11  2.10  2.10   FEV1-Predicted Pre % 83  72  72  74  79  78  77   FEV1-Post L      2.20  2.16   DLCO uncorrected ml/min/mmHg 12.64  12.16  12.31  13.31  14.10  14.76  15.56   DLCO UNC% % 58  41  42  45  48  50  53   DLCO corrected ml/min/mmHg 12.64   12.35  13.43  13.90  15.20    DLCO COR %Predicted % 58   42  46  47  52    DLVA Predicted % 80  64  68  63  61  66  70   TLC L      4.85  4.11   TLC % Predicted %      86  73   RV % Predicted %      84  59     No results found for: "NITRICOXIDE"      Assessment & Plan:   No problem-specific Assessment & Plan notes found for this encounter.     Rubye Oaks, NP 05/01/2023

## 2023-05-02 NOTE — Assessment & Plan Note (Signed)
IPF with probable UIP on CT chest.   Previously tried Esbriet but was unable to tolerate.  Patient declines antifibrotic's. Last high-resolution CT chest was in October 2022 with ILD changes with probable UIP.  Minimal progression.  Along with COPD or emphysema. Spirometry with DLCO and high-resolution CT chest ordered

## 2023-05-02 NOTE — Assessment & Plan Note (Signed)
Acute COPD exacerbation appears to be improving with empiric antibiotics and steroids.  Recommend her to finish up her current course.  Continue on Bevespi twice daily.  Check PFTs on return  Plan  Patient Instructions  Finish Doxycycline and Prednisone as directed.  Mucinex DM As needed   Continue on Bivespi 2 puffs Twice daily   Albuterol inhaler/neb As needed   Restart weekly Prolastin infusion this week as planned.  Set up for HRCT chest to follow ILD.  Follow-up in 6 weeks with Spirometry with DLCO with Dr . Marchelle Gearing and as needed Please contact office for sooner follow up if symptoms do not improve or worsen or seek emergency care

## 2023-05-02 NOTE — Assessment & Plan Note (Signed)
Alpha-1 MZ phenotype with low level.  Has noticed a clinical decline since being off of replacement therapy.  Insurance has approved and patient is restarting Prolastin this week.

## 2023-05-05 DIAGNOSIS — Z8709 Personal history of other diseases of the respiratory system: Secondary | ICD-10-CM | POA: Diagnosis not present

## 2023-05-05 DIAGNOSIS — Z09 Encounter for follow-up examination after completed treatment for conditions other than malignant neoplasm: Secondary | ICD-10-CM | POA: Diagnosis not present

## 2023-05-17 ENCOUNTER — Telehealth: Payer: Self-pay | Admitting: Internal Medicine

## 2023-05-17 NOTE — Telephone Encounter (Signed)
Victorino Dike checking on fax for orders. Victorino Dike phone number is [phone].

## 2023-05-19 NOTE — Telephone Encounter (Signed)
I checked MR's lookat and nothing on this pt  Angela [name] and there was no answer- LMTCB

## 2023-05-22 NOTE — Telephone Encounter (Signed)
Jennifer refaxing orders. All 4 pages needs to signed. Fax number is [phone].

## 2023-05-23 NOTE — Telephone Encounter (Signed)
I have the fax order. But Dr Marchelle Gearing is not here to sign so I will have another Dr sign ordered and fax back. Nothing further needed.

## 2023-05-30 ENCOUNTER — Ambulatory Visit (HOSPITAL_BASED_OUTPATIENT_CLINIC_OR_DEPARTMENT_OTHER): Payer: PPO

## 2023-05-31 DIAGNOSIS — G4733 Obstructive sleep apnea (adult) (pediatric): Secondary | ICD-10-CM | POA: Diagnosis not present

## 2023-05-31 DIAGNOSIS — I10 Essential (primary) hypertension: Secondary | ICD-10-CM | POA: Diagnosis not present

## 2023-05-31 DIAGNOSIS — Z8673 Personal history of transient ischemic attack (TIA), and cerebral infarction without residual deficits: Secondary | ICD-10-CM | POA: Diagnosis not present

## 2023-06-22 NOTE — Patient Instructions (Addendum)
ICD-10-CM   1. COPD exacerbation (HCC)  J44.1     2. Alpha-1-antitrypsin deficiency (HCC)  E88.01     3. Pulmonary emphysema with fibrosis of lung (HCC)  J43.9    J84.10     4. IPF (idiopathic pulmonary fibrosis) (HCC)  J84.112     5. Rash and nonspecific skin eruption  R21        - you had covid feb 2021 and July 2022 - On 06/23/2023 in copd flare up possibly related to being off proloastin x 13 weeks due to supplier change - LAst PFT and CT are in  Oct 2022; need restarted - Need to see if you have eosinophilic inflammation of airway    - Plan - check CBC with diff and blood igE  - Take prednisone 40 mg daily x 2 days, then 20mg  daily x 2 days, then 10mg  daily x 2 days, then 5mg  daily x 2 days and stop - Take doxycycline 100mg  po twice daily x 5 days; take after meals and avoid sunlight - do full PFT in 8 weeks - do HRCT in 8 weeks - continue BEVESPI scheduled - continue prolastin scheduled - respect flu shot deferral - cpap without o2 for your OSA    Followup  - 8 weeks with APP

## 2023-06-22 NOTE — Progress Notes (Addendum)
OV 10/10/2016  Chief Complaint  Patient presents with   Follow-up    Pt states overall her breathing has slightly improved since last OV. Pt states she has prod cough with yellow. Pt c/o midsternal CP 20 min after 2nd or 3rd dose of Esbriet.     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 This was also personally visualized. The main issues that were 3+3 times daily of Pirfenidone (Esbriet) she's having significant GI side effects of fatigue, nausea and bloated sensation and even chest pain after her dose. She's tried ginger she's had between meals. She has pacingg doses for several hours. All and communication with the help desk - but no improvement. She rates his symptoms is severe. Nevertheless because of the severity of pulmonary fibrosis and she is willing to continue with the medications although at a lower dose. She's not interested in switching over to  Ofev at this point in time. In the past she's not being in her about participating in the Pulm fibrosis foundation support group or research trials. She's not lost weight. Not yet had transplant discussion in detail. She is asking about alpha one replacement therapy because of the decline in lung function and also recurrent exacerbations with the antibiotic courses since last visit.    OV 10/10/2016  MR Chief Complaint  Patient presents with   Follow-up    Pt states overall her breathing has slightly improved since last OV. Pt states she has prod cough with yellow. Pt c/o midsternal CP 20 min after 2nd or 3rd dose of Esbriet.   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 This was  also personally visualized. The main issues that were 3+3 times daily of Pirfenidone (Esbriet) she's having significant GI side effects of fatigue, nausea and bloated sensation and even chest pain after her dose. She's tried ginger she's had between meals. She has pacingg doses for several hours. All and communication with the help desk - but no improvement. She rates his symptoms is severe. Nevertheless because of the severity of pulmonary fibrosis and she is willing to continue with the medications although at a lower dose. She's not interested in switching over to  Ofev at this point in time. In the past she's not being in her about participating in the Pulm fibrosis foundation support group or research trials. She's not lost weight. Not yet had transplant discussion in detail. She is asking about alpha one replacement therapy because of the decline in lung function and also recurrent exacerbations with the antibiotic courses since last visit.  rec    - start prolastin replacement for MZ phenotype with decline lung function and repeated flare up> denied by insurance  - continue esbriet but reduce dose to to 2 pill three times daily - take with food and ginger    11/08/2016 acute extended ov/Wert re:  GOLD 0 copd/ MZ quit smoking 2013 / underlying ILD on perfenidone  Chief Complaint  Patient presents with   Acute Visit    coughing so much it burns, used nebulizer to help, congestion with clear/yellow mucus, headache  new cough that caused vomiting late 90s  zpak did not work/ doxy/pred  Worked  last flare and lasted  4-5 weeks then acutely worse x one day prior to OV with severe coughing fits and midline cp brought on by coughing some better with neb/ ? spiriva dpi making it worse   No obvious day to day or daytime variability or assoc   mucus plugs or hemoptysis or cp or chest tightness, subjective wheeze or overt sinus or hb symptoms. No unusual exp hx or h/o childhood pna/ asthma or knowledge of  premature birth.  Sleeping ok without nocturnal  or early am exacerbation  of respiratory  c/o's or need for noct saba. Also denies any obvious fluctuation of symptoms with weather or environmental changes or other aggravating or alleviating factors except as outlined above    OV 12/02/2016  Chief Complaint  Patient presents with   Follow-up    Pt is here for 6 wk f/u. Pt fibrosis has remained unchanged, but has a question about the esbriet and prolastin    Follow-up idiopathic pulmonary fibrosis Emphysema MZ phenotype  Overall stable since last visit. Alpha-1 replacement has been approved but because of her  Ex-husband's death she has more money now and therefore her co-pay for both Prolastin and esbriet will increase. She's been worked [employer] to keep the co-pay cost down. If she does not succeed with this she will give up on both drugs. Prolastin has not been started yet. No new issues.   OV 06/08/2017  Chief Complaint  Patient presents with   Follow-up    Pt states her breathing is unchanged since last OV. Pt c/o dry cough. Pt denies CP/tightness and f/c/s.      .Follow-up idiopathic pulmonary fibrosis with associated emphysema MZ phenotype with low levels of 75 mg/dL  She is here for routine follow-up. Last visit January 2018. Since then she's reporting progressive worsening of cough and shortness of breath particularly with significant exertion such as climbing stairs or walking fast. In terms of IPF she is not interested in Pirfenidone [employer]) because of side effects that she had. She gave this up late last year early this year. She is also not interested in Ofev because of the diarrhea side effects. She prefers to be on basic supportive care and she is interested in pulmonary IPF research trials. In terms of emphysema MZ phenotype she is very interested in alpha-1 replacement therapy. The last 6 months. Been getting insurance denial for the same. She showed me that Medicare  part D criteria which is based on low levels, progression and also low FEV1. She will not be a candidate for low FEV1 . Results for  because of mixed emphysema and fibrosis. I will need to do a special appealed for this. Otherwise no other changes in health status.     OV 09/13/2017  Chief Complaint  Patient presents with   Follow-up    PFT done today, breathing better on prolastin    Follow-up idiopathic pulmonary fibrosis on basic supportive care because she does not want to do either fibrotic Follow-up associated emphysema MZ phenotype with low levels - on Prolastin therapy  She is following up. She again continues to refuse anti-fibrotic therapy. She is now on Prolastin and has had 9 infusions. She is tolerating this well. She tells me that overall she feels the lungs a stronger Her cough quality is better able to bring her better sputum. She feels that she is less at risk for infections. He just makes her feel good. In fact she feels confident that she  can tolerate a flu shot and is willing to have at this time.however pulmonary function test continues to show decline as documented below    OV 07/05/2018  Chief Complaint  Patient presents with   Follow-up    Pt had a 6mw performed today.  Pt states she has been having some problems with the heat and breathing in dirt from where they have been doing a lot of construction. States she has been having an occ. cough with yellow mucus and states when it rains she will have some pain in her left lung.     Follow-up idiopathic pulmonary fibrosis on basic supportive care because she does not want to do either fibrotic Follow-up associated emphysema MZ phenotype with low levels - on Prolastin therapy   Angela [name] - returns for follow-up of the above. She continues to do well. Minimally symptomatic. Walking desaturation test 6 minute walk test did not desaturate. I was very concerned in October 2018 that despite Prolastin her lung  function was declining as documented below but since then she's not had any exacerbations except one in spring 2019And April 2019 Allred function test showed stability. She certainly feels she is stable. She does not want any antibiotics. She's not interested in IPF research trials     OV 08/29/2019  Subjective:  Patient ID: Angela [name], female , DOB: 11-28-1945 , age 86 y.o. , MRN: [id number] , ADDRESS: [address] [place] [address]   08/29/2019 -   Chief Complaint  Patient presents with   Follow-up    Pt c/o SOB, headache, prod cough with yellow mucus, chest burning x 1 month. Pt denies f/c/s. Pt states she was given a zpak by her PCP and it help breifly but now s/s are back like before. Pt also states she has noticed lately the spiriva doesnt seem to be helping.      Follow-up idiopathic pulmonary fibrosis on basic supportive care because she does not want to do either fibrotic Follow-up associated emphysema MZ phenotype with low levels - on Prolastin therapy    HPI Angela [name] 77 y.o. -telephone visit.  The risk benefits and limitations of telephone visit explained.  I personally not seen the patient in over a year.  She says she is continuing to get Prolastin for her alpha-1 antitrypsin and is continuing to do Spiriva.  In August 2020 she ended up in the ER with worsening cough.  This was [facility].  She had a chest x-ray that was clear and was sent home on prednisone.  She did not have any blood work or COVID testing or antibiotics.  Since then she is having yellow sputum worsening cough worsening shortness of breath compared to her baseline but is not progressively worse.  She just has a new lower baseline.  She was frustrated by the ER visit.  There are no other complaints.  Is no fever or chills.  No known covert exposures.  COVID risk activity is low.  She is social distancing quite well.   Last CT scan of the chest was in 2018 with the pulmonary  fibrosis.  Last pulmonary function test was April 2019 which already showed a progressive pattern.  She refused anti-fibrotic's for her ILD.     IMPRESSION: 1. There continues to be a spectrum of findings indicative of interstitial lung disease with minimal progression compared to the prior study. The overall appearance is considered a "probable UIP CT pattern." 2.  Diffuse bronchial wall thickening with moderate centrilobular and paraseptal emphysema; imaging findings suggestive of underlying COPD. 3. Aortic atherosclerosis, in addition to left main and 2 vessel coronary artery disease. Please note that although the presence of coronary artery calcium documents the presence of coronary artery disease, the severity of this disease and any potential stenosis cannot be assessed on this non-gated CT examination. Assessment for potential risk factor modification, dietary therapy or pharmacologic therapy may be warranted, if clinically indicated. 4. Mild hepatic steatosis.   Aortic Atherosclerosis (ICD10-I70.0) and Emphysema (ICD10-J43.9).     Electronically Signed   By: Trudie Reed M.D.   On: 06/22/2017 17:00 ROS - per HPI    OV 06/23/2021  Subjective:  Patient ID: Angela [name], female , DOB: Mar 08, 1946 , age 58 y.o. , MRN: [id number] , ADDRESS: [address] [place] [address] PCP Shirlean Mylar, MD Patient Care Team: Shirlean Mylar, MD as PCP - General (Family Medicine)  This Provider for this visit: Treatment Team:  Attending Provider: Kalman Shan, MD    06/23/2021 -   Chief Complaint  Patient presents with   Follow-up    Pt states she has been doing okay since last visit. States she had it rough after her last covid vaccine but is doing fine now.  Follow-up idiopathic pulmonary fibrosis on basic supportive care because she does not want to do either fibrotic Follow-up associated emphysema MZ phenotype with low levels - on Prolastin therapy Follow-up  combined emphysema alpha-1 with pulmonary fibrosis IPF  HPI Angela [name] 77 y.o. -returns for follow-up.  I personally saw her in 2020.  After that she has had 2 visits with nurse practitioner 2021 and again 2 visits with nurse practitioner 2022.  She has had COVID 2 times.  She got monoclonal antibody for 1 of this.  The first 1 according to history was February 2021.  The second 1 was July 2022.  She did not take any Paxil with.  She feels the prolapse since helping her significantly.  1 time she ran out of Prolastin because of insurance coverage issues and she felt symptomatic.  She is also taking Stiolto which she says is helping her significantly.  She is applied for financial assistance with with the company and she is waiting to hear.  Till then she wants some samples to tide over.  Of note she has incisional hernia in the previous ventral hernia repair.  She is slowly getting worse.  She has missed surgical appointments because of COVID.  She wants to reestablish with surgery.  She wants me to make a referral.  From a respiratory standpoint she is stable.  The hernia does get worse with a cough.  She is willing to get restaged.  She says once with the COVID most recently in July sputum is a change in quality and color.  Therefore she is interested in imaging.    HRCT 09/23/2019   IMPRESSION: 1. The appearance of the lungs is compatible with interstitial lung disease, with a spectrum of findings which is considered probable usual interstitial pneumonia (UIP) per current ATS guidelines. Overall, findings appear stable compared to the prior study from 2018. 2. In addition, there is diffuse bronchial wall thickening with moderate to severe centrilobular and paraseptal emphysema; imaging findings suggestive of underlying COPD. 3. Aortic atherosclerosis, in addition to left main and 2 vessel coronary artery disease. Assessment for potential risk factor modification, dietary therapy or  pharmacologic therapy may be warranted,  if clinically indicated. 4. Hepatic steatosis.   Aortic Atherosclerosis (ICD10-I70.0) and Emphysema (ICD10-J43.9).     Electronically Signed   By: Trudie Reed M.D.   On: 09/23/2019 14:53   OV 08/23/2021  Subjective:  Patient ID: Angela [name], female , DOB: [birth year] , age 60 y.o. , MRN: [id number] , ADDRESS: [address] [place] [address] PCP Shirlean Mylar, MD (Inactive) Patient Care Team: Shirlean Mylar, MD (Inactive) as PCP - General (Family Medicine)  This Provider for this visit: Treatment Team:  Attending Provider: Kalman Shan, MD    08/23/2021 -   Chief Complaint  Patient presents with   Follow-up    PFT done today. Inhalers working.   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 -returns for follow-up.  At the last visit we referred her to [facility] surgery because of ventral hernia.  She has seen Dr. Derrell Lolling CT scan has been ordered.  She is going to have this in the next day or 2.  This is going to be with the CT chest.  She might not have surgery because there is concern for ideations according to history.  She continues [employer].  She had pulmonary function test today that shows an improvement and return back to 2018 levels but it still was in 2016.  She is pleased by this report.  Discussed because she feels Stiolto is helping her significantly.  She is not interested in flu vaccine or COVID-vaccine.  She is not interested in antifibrotic.  She feels she is very sensitive to medications.   05/01/2023 Follow up: COPD , Alpha 1 MZ phenotype.  Patient returns for follow-up visit.  Last seen May 2023.  At that visit she was seen for COPD  exacerbation treated with antibiotics and steroids.  Patient missed her follow-up in October with Dr. Marchelle Gearing.  Patient complains of over the next few weeks that she has had increased cough and congestion.  Seen by primary care and started on doxycycline and steroids.  She has a few days left and is starting to feel better.  Decreased cough and congestion.  Has noticed that her breathing has not been doing as well over the last few months.  She has been off of her Prolastin replacement therapy due to insurance reasons.  Insurance has approved and has an infusion set up for this week.  .  She denies any hemoptysis, chest pain, orthopnea, edema.  She remains on Bevespi twice daily.     OV 06/23/2023  Subjective:  Patient ID: Angela [name], female , DOB: 09-01-1946 , age 25 y.o. , MRN: [id number] , ADDRESS: [address] [place] [address] PCP Shirlean Mylar, MD Patient Care Team: Shirlean Mylar, MD as PCP - General (Family Medicine)  This Provider for this visit: Treatment Team:  Attending Provider: Kalman Shan, MD    Follow-up idiopathic pulmonary fibrosis on basic supportive care because she does not want to do either antifibrotic Follow-up associated emphysema MZ phenotype with low levels - on Prolastin therapy Follow-up combined emphysema alpha-1 with pulmonary fibrosis IPF OSA on CPAP   06/23/2023 -   Chief Complaint  Patient presents with   Follow-up    F/up, rattling in chest.     HPI Angela Sanes  [name] 77 y.o. -returns for follow-up.  I personally am not seen since 2022.  She has been getting Prolastin but the supply changed and then she ran out of Prolastin for 13 weeks.  She is back on Prolastin now for 4 weeks.  She tells me for the last 10 days she is having increased cough, thick yellow sputum, increased wheezing, increased chest tightness increased shortness of breath but no nocturnal symptoms she is able to sleep well no paroxysmal nocturnal dyspnea no orthopnea.   She does use CPAP for sleeping.  She is on combination Bevespi along with Prolastin for COPD She is on albuterol as needed She is not on any antifibrotic's Noted she is on beta-1 specific beta-blocker  No sick contacts  Current symptoms are as below   SYMPTOM SCALE - ILD 06/23/2023  Current weight   O2 use ra  Shortness of Breath 0 -> 5 scale with 5 being worst (score 6 If unable to do)  At rest 2  Simple tasks - showers, clothes change, eating, shaving 3  Household (dishes, doing bed, laundry) 3  Shopping 3  Walking level at own pace 4  Walking up Stairs 4  Total (30-36) Dyspnea Score 19  How bad is your cough? 4  How bad is your fatigue 3  How bad is nausea 0  How bad is vomiting?  0  How bad is diarrhea? 0  How bad is anxiety? 1  How bad is depression 1  Any chronic pain - if so where and how bad x        CT Chest data from date: 08/25/21  - personally visualized and independently interpreted : NO - my findings are: as below  IMPRESSION: 1. The appearance of the lungs is compatible with interstitial lung disease, once again categorized as probable usual interstitial pneumonia (UIP) per current ATS guidelines. Minimal progression is noted compared to the prior examination. 2. In addition, there is diffuse bronchial wall thickening with severe centrilobular and paraseptal emphysema; imaging findings suggestive of underlying COPD. 3. Aortic atherosclerosis, in addition to left main and 3 vessel coronary artery disease. Assessment for potential risk factor modification, dietary therapy or pharmacologic therapy may be warranted, if clinically indicated.   Aortic Atherosclerosis (ICD10-I70.0) and Emphysema (ICD10-J43.9).     Electronically Signed   By: Trudie Reed M.D.   On: 08/26/2021 12:23  PFT     Latest Ref Rng & Units 08/23/2021    1:33 PM 02/14/2018    9:14 AM 09/13/2017    9:05 AM 06/08/2017    8:48 AM 08/16/2016   10:21 AM 03/04/2016   12:53  PM 08/28/2015    4:37 PM  ILD indicators  FVC-Pre L 2.60  2.44  2.45  2.63  2.69  2.79  2.81   FVC-Predicted Pre % 78  70  70  75  76  79  79   FVC-Post L      2.84  2.75   FVC-Predicted Post %      81  77   TLC L      4.85  4.11   TLC Predicted %      86  73   DLCO uncorrected ml/min/mmHg 12.64  12.16  12.31  13.31  14.10  14.76  15.56   DLCO UNC %Pred % 58  41  42  45  48  50  53   DLCO Corrected ml/min/mmHg 12.64   12.35  13.43  13.90  15.20  DLCO COR %Pred % 58   42  46  47  52        LAB RESULTS last 96 hours No results found.  LAB RESULTS last 90 days No results found for this or any previous visit (from the past 2160 hour(s)).       has a past medical history of Allergy, Alpha 1-antitrypsin PiMS phenotype, Anemia (2014), Anxiety, Arthritis, Asthma, Blood transfusion without reported diagnosis, Cataract, Chronic kidney disease, Complication of anesthesia, COPD (chronic obstructive pulmonary disease) (HCC), DDD (degenerative disc disease), Depression, Diabetes mellitus, Diverticulitis, Diverticulosis, Esophageal dysmotility (03/01/06), Fibromyalgia, GERD (gastroesophageal reflux disease), Glaucoma, Heart murmur, Hypercholesterolemia, Hypertension, IPF (idiopathic pulmonary fibrosis) (HCC), Shingles, Shortness of breath dyspnea, Sleep apnea, and Stroke (HCC) (2010).   reports that she quit smoking about 11 years ago. Her smoking use included cigarettes. She started smoking about 57 years ago. She has a 46.4 pack-year smoking history. She has never used smokeless tobacco.  Past Surgical History:  Procedure Laterality Date   ABDOMINAL HYSTERECTOMY     ANTERIOR FUSION CERVICAL SPINE     APPENDECTOMY     BACK SURGERY     x4   CARPAL TUNNEL RELEASE Bilateral    COLONOSCOPY     DE QUERVAIN'S RELEASE     right   GASTRIC FUNDOPLICATION     HERNIA REPAIR     KNEE ARTHROSCOPY     left    LUNG BIOPSY Left 03/23/2016   Procedure: LUNG BIOPSY;  Surgeon: Loreli Slot, MD;   [facility] OR;  Service: Thoracic;  Laterality: Left;   LYMPH NODE BIOPSY Left 03/23/2016   Procedure: LYMPH NODE BIOPSY;  Surgeon: Loreli Slot, MD;  Location: [facility] OR;  Service: Thoracic;  Laterality: Left;   POSTERIOR CERVICAL FUSION/FORAMINOTOMY N/A 03/16/2015   Procedure: POSTERIOR CERVICAL LAMINECTOMY AND DISKECTOMOY AT CERVICAL SEVEN-THORACIC ONE ;POSTERIOR CERVICAL FUSION CERVICAL SIX TO THORACIC ONE;  Surgeon: Donalee Citrin, MD;  Location: [facility] NEURO ORS;  Service: Neurosurgery;  Laterality: N/A;  Site is Cervical/Thoracic   TONSILLECTOMY     UPPER GASTROINTESTINAL ENDOSCOPY     VIDEO ASSISTED THORACOSCOPY Left 03/23/2016   Procedure: VIDEO ASSISTED THORACOSCOPY;  Surgeon: Loreli Slot, MD;  Location: [facility] OR;  Service: Thoracic;  Laterality: Left;    Allergies  Allergen Reactions   Tizanidine Other (See Comments)    Severe decrease in BP    Influenza Vaccines Other (See Comments)    becomes sick with the flu   Other     PATIENT STATES SHE CANNOT TAKE ANY VACCINATIONS PER HER PHYSICIAN   Vicodin Hp [Hydrocodone-Acetaminophen] Nausea And Vomiting   Betadine [Povidone Iodine] Rash   Ciprofloxacin Rash   Codeine Nausea And Vomiting   Iohexol      Desc: Pt states several years ago during a CT scan w/ iv cotnrast she had severe nausea and vomiting, sob w/ difficulty breathing and swallowing.  She had full 13hr. premeds today (5/14/7) and did fine w/o complications.     Immunization History  Administered Date(s) Administered   Influenza, High Dose Seasonal PF 09/13/2017, 08/14/2020   Moderna Sars-Covid-2 Vaccination 12/16/2019, 01/13/2020, 09/22/2020, 06/09/2021   Pneumococcal Conjugate-13 10/15/2014   Pneumococcal-Unspecified 11/14/2012   Tdap 09/05/2016, 12/21/2022   Zoster, Live 07/16/2021    Family History  Problem Relation Age of Onset   Cancer Mother        brain   Lung cancer Mother    COPD Father    Stomach cancer Maternal Grandfather  Colon cancer  Maternal Aunt    Esophageal cancer Neg Hx    Rectal cancer Neg Hx      Current Outpatient Medications:    acetaminophen (TYLENOL) 500 MG tablet, Take 500 mg by mouth every 6 (six) hours as needed., Disp: , Rfl:    albuterol (PROVENTIL) (2.5 MG/3ML) 0.083% nebulizer solution, Take 3 mLs (2.5 mg total) by nebulization every 6 (six) hours as needed for wheezing or shortness of breath., Disp: 90 mL, Rfl: 5   Bacillus Coagulans-Inulin (PROBIOTIC-PREBIOTIC PO), Take 2 tablets by mouth in the morning., Disp: , Rfl:    benzonatate (TESSALON) 100 MG capsule, Take 100 mg by mouth 3 (three) times daily as needed., Disp: , Rfl:    brimonidine (ALPHAGAN) 0.15 % ophthalmic solution, Place 1 drop into both eyes in the morning and at bedtime., Disp: , Rfl: 3   buPROPion (WELLBUTRIN XL) 300 MG 24 hr tablet, Take 300 mg by mouth in the morning., Disp: , Rfl:    Camphor-Eucalyptus-Menthol (VICKS VAPORUB) 4.73-1.2-2.6 % OINT, Apply 1 application topically at bedtime., Disp: , Rfl:    cyclobenzaprine (FLEXERIL) 10 MG tablet, Take 10 mg by mouth at bedtime as needed for muscle spasms. , Disp: , Rfl: 0   dimethicone (REMEDY NUTRASHIELD) 1 % cream, Apply 1 application topically 2 (two) times daily as needed for dry skin., Disp: , Rfl:    doxycycline (VIBRA-TABS) 100 MG tablet, Take 1 tablet (100 mg total) by mouth 2 (two) times daily., Disp: 10 tablet, Rfl: 0   Glycopyrrolate-Formoterol (BEVESPI AEROSPHERE) 9-4.8 MCG/ACT AERO, Inhale 2 puffs into the lungs 2 (two) times daily., Disp: 10.7 g, Rfl: 5   hydrochlorothiazide (HYDRODIURIL) 12.5 MG tablet, Take 12.5 mg by mouth every morning., Disp: , Rfl:    latanoprost (XALATAN) 0.005 % ophthalmic solution, Place 1 drop into the left eye at bedtime. Left eye, Disp: , Rfl:    losartan (COZAAR) 50 MG tablet, Take 50 mg by mouth in the morning., Disp: , Rfl:    meclizine (ANTIVERT) 12.5 MG tablet, Take 1 tablet (12.5 mg total) by mouth 3 (three) times daily as needed for  dizziness., Disp: 16 tablet, Rfl: 0   Menthol (ICY HOT) 5 % [facility], Place 1 patch onto the skin daily as needed (back pain.)., Disp: , Rfl:    Menthol, Topical Analgesic, (BIOFREEZE EX), Apply 1 application topically 3 (three) times daily as needed (back pain.)., Disp: , Rfl:    metFORMIN (GLUCOPHAGE-XR) 500 MG 24 hr tablet, Take 1,000 mg by mouth in the morning and at bedtime., Disp: , Rfl: 11   metoprolol succinate (TOPROL-XL) 25 MG 24 hr tablet, TAKE 1 TABLET(25 MG) BY MOUTH DAILY, Disp: 90 tablet, Rfl: 1   Multiple Vitamin (MULTIVITAMIN WITH MINERALS) TABS tablet, Take 1 tablet by mouth in the morning., Disp: , Rfl:    omeprazole (PRILOSEC) 40 MG capsule, TAKE 1 CAPSULE(40 MG) BY MOUTH TWICE DAILY BEFORE LUNCH AND SUPPER, Disp: 90 capsule, Rfl: 3   OVER THE COUNTER MEDICATION, Take 1 capsule by mouth daily as needed (congestion). Serrapeptase Capsules, Disp: , Rfl:    predniSONE (DELTASONE) 10 MG tablet, 4 tabs for 2 days, then 3 tabs for 2 days, 2 tabs for 2 days, then 1 tab for 2 days, then stop, Disp: 20 tablet, Rfl: 0   predniSONE (DELTASONE) 10 MG tablet, Take 1 tablet (10 mg total) by mouth daily with breakfast., Disp: 20 tablet, Rfl: 0   PROLASTIN-C 1000 MG/20ML SOLN, Inject 1,000 mg into  the vein every Tuesday., Disp: , Rfl:    rosuvastatin (CRESTOR) 5 MG tablet, Take 5 mg by mouth daily., Disp: , Rfl:    sertraline (ZOLOFT) 50 MG tablet, Take 50 mg by mouth in the morning., Disp: , Rfl:    vitamin B-12 (CYANOCOBALAMIN) 1000 MCG tablet, Take 1,000 mcg by mouth in the morning., Disp: , Rfl:    doxycycline (VIBRA-TABS) 100 MG tablet, Take 100 mg by mouth 2 (two) times daily. (Patient not taking: Reported on 06/23/2023), Disp: , Rfl:    Tiotropium Bromide-Olodaterol (STIOLTO RESPIMAT) 2.5-2.5 MCG/ACT AERS, Inhale 2 puffs into the lungs daily. (Patient not taking: Reported on 06/23/2023), Disp: 4 g, Rfl: 5   Tiotropium Bromide-Olodaterol 2.5-2.5 MCG/ACT AERS, Inhale 2 puffs into the lungs daily.  (Patient not taking: Reported on 06/23/2023), Disp: 4 g, Rfl: 0      Objective:   Vitals:   06/23/23 0951  BP: (!) 150/80  Pulse: 60  SpO2: 95%  Weight: 186 lb (84.4 kg)  Height: 5' 7.5" (1.715 m)    Estimated body mass index is 28.7 kg/m as calculated from the following:   Height as of this encounter: 5' 7.5" (1.715 m).   Weight as of this encounter: 186 lb (84.4 kg).  @WEIGHTCHANGE @  [employer]   06/23/23 0951  Weight: 186 lb (84.4 kg)     Physical Exam   General: No distress. obese O2 at rest: no Cane present: no Sitting in wheel chair: no Frail: no Obese: yes Neuro: Alert and Oriented x 3. GCS 15. Speech normal Psych: Pleasant Resp:  Barrel Chest - no.  Wheeze - no, Crackles - yes bsae. Coarse, No overt respiratory distress CVS: Normal heart sounds. Murmurs - no Ext: Stigmata of Connective Tissue Disease - no HEENT: Normal upper airway. PEERL +. No post nasal drip        Assessment:       ICD-10-CM   1. COPD exacerbation (HCC)  J44.1     2. Alpha-1-antitrypsin deficiency (HCC)  E88.01     3. Pulmonary emphysema with fibrosis of lung (HCC)  J43.9    J84.10     4. IPF (idiopathic pulmonary fibrosis) (HCC)  Z61.096 Pulmonary function test    CT Chest High Resolution    5. Rash and nonspecific skin eruption  R21          Plan:     Patient Instructions     ICD-10-CM   1. COPD exacerbation (HCC)  J44.1     2. Alpha-1-antitrypsin deficiency (HCC)  E88.01     3. Pulmonary emphysema with fibrosis of lung (HCC)  J43.9    J84.10     4. IPF (idiopathic pulmonary fibrosis) (HCC)  J84.112     5. Rash and nonspecific skin eruption  R21        - you had covid feb 2021 and July 2022 - On 06/23/2023 in copd flare up possibly related to being off proloastin x 13 weeks due to supplier change - LAst PFT and CT are in  Oct 2022; need restarted - Need to see if you have eosinophilic inflammation of airway    - Plan - check CBC with diff and blood  igE  - Take prednisone 40 mg daily x 2 days, then 20mg  daily x 2 days, then 10mg  daily x 2 days, then 5mg  daily x 2 days and stop - Take doxycycline 100mg  po twice daily x 5 days; take after meals and avoid sunlight - do  full PFT in 8 weeks - do HRCT in 8 weeks - continue BEVESPI scheduled - continue prolastin scheduled - respect flu shot deferral - cpap without o2 for your OSA    Followup  - 8 weeks with APP   FOLLOWUP Return in about 8 weeks (around 08/18/2023) for 15 min visit, ILD, copd, Face to Face Visit.    SIGNATURE    Dr. Kalman Shan, M.D., F.C.C.P,  Pulmonary and Critical Care Medicine Staff Physician, [facility] Center Director - Interstitial Lung Disease  Program  Pulmonary Fibrosis [facility] at [facility] [place], [place], [address]  Pager: [phone], If no answer or between  15:00h - 7:00h: call [phone] Telephone: [phone]  10:27 AM 06/23/2023

## 2023-06-23 ENCOUNTER — Encounter: Payer: Self-pay | Admitting: Internal Medicine

## 2023-06-23 ENCOUNTER — Ambulatory Visit: Payer: PPO | Admitting: Internal Medicine

## 2023-06-23 VITALS — BP 150/80 | HR 60 | Ht 67.5 in | Wt 186.0 lb

## 2023-06-23 DIAGNOSIS — J8410 Pulmonary fibrosis, unspecified: Secondary | ICD-10-CM | POA: Diagnosis not present

## 2023-06-23 DIAGNOSIS — R21 Rash and other nonspecific skin eruption: Secondary | ICD-10-CM

## 2023-06-23 DIAGNOSIS — J84112 Idiopathic pulmonary fibrosis: Secondary | ICD-10-CM

## 2023-06-23 DIAGNOSIS — J439 Emphysema, unspecified: Secondary | ICD-10-CM

## 2023-06-23 DIAGNOSIS — J441 Chronic obstructive pulmonary disease with (acute) exacerbation: Secondary | ICD-10-CM

## 2023-06-23 DIAGNOSIS — E8801 Alpha-1-antitrypsin deficiency: Secondary | ICD-10-CM

## 2023-06-23 LAB — CBC WITH DIFFERENTIAL/PLATELET
Basophils Absolute: 0.1 10*3/uL (ref 0.0–0.1)
Basophils Relative: 1.2 % (ref 0.0–3.0)
Eosinophils Absolute: 0.3 10*3/uL (ref 0.0–0.7)
Eosinophils Relative: 3 % (ref 0.0–5.0)
HCT: 40.6 % (ref 36.0–46.0)
Hemoglobin: 13 g/dL (ref 12.0–15.0)
Lymphocytes Relative: 26 % (ref 12.0–46.0)
Lymphs Abs: 2.8 10*3/uL (ref 0.7–4.0)
MCHC: 32 g/dL (ref 30.0–36.0)
MCV: 85.4 fl (ref 78.0–100.0)
Monocytes Absolute: 0.8 10*3/uL (ref 0.1–1.0)
Monocytes Relative: 7.4 % (ref 3.0–12.0)
Neutro Abs: 6.7 10*3/uL (ref 1.4–7.7)
Neutrophils Relative %: 62.4 % (ref 43.0–77.0)
Platelets: 370 10*3/uL (ref 150.0–400.0)
RBC: 4.75 Mil/uL (ref 3.87–5.11)
RDW: 15.3 % (ref 11.5–15.5)
WBC: 10.8 10*3/uL — ABNORMAL HIGH (ref 4.0–10.5)

## 2023-06-23 MED ORDER — DOXYCYCLINE HYCLATE 100 MG PO TABS: 100.0000 mg | ORAL_TABLET | Freq: Two times a day (BID) | ORAL | 0 refills | Status: DC

## 2023-06-23 MED ORDER — PREDNISONE 10 MG PO TABS: 10.0000 mg | ORAL_TABLET | Freq: Every day | ORAL | 0 refills | Status: DC

## 2023-06-23 NOTE — Addendum Note (Signed)
Addended by: Hedda Slade on: 06/23/2023 10:21 AM   Modules accepted: Orders

## 2023-06-23 NOTE — Addendum Note (Signed)
Addended by: Hedda Slade on: 06/23/2023 10:27 AM   Modules accepted: Orders

## 2023-06-29 DIAGNOSIS — I700 Atherosclerosis of aorta: Secondary | ICD-10-CM | POA: Diagnosis not present

## 2023-06-29 DIAGNOSIS — Z66 Do not resuscitate: Secondary | ICD-10-CM | POA: Diagnosis not present

## 2023-06-29 DIAGNOSIS — E1122 Type 2 diabetes mellitus with diabetic chronic kidney disease: Secondary | ICD-10-CM | POA: Diagnosis not present

## 2023-06-29 DIAGNOSIS — N1831 Chronic kidney disease, stage 3a: Secondary | ICD-10-CM | POA: Diagnosis not present

## 2023-06-29 DIAGNOSIS — J449 Chronic obstructive pulmonary disease, unspecified: Secondary | ICD-10-CM | POA: Diagnosis not present

## 2023-06-29 DIAGNOSIS — I10 Essential (primary) hypertension: Secondary | ICD-10-CM | POA: Diagnosis not present

## 2023-06-29 DIAGNOSIS — Z7984 Long term (current) use of oral hypoglycemic drugs: Secondary | ICD-10-CM | POA: Diagnosis not present

## 2023-07-02 ENCOUNTER — Telehealth: Payer: Self-pay | Admitting: Internal Medicine

## 2023-07-02 NOTE — Telephone Encounter (Signed)
   Blood eos high but IgE normal  Plan- she might benefir from DUPixent to prevent copd exacerbation -> refer to devki for discussions  Recent Results (from the past 2160 hour(s))  IgE     Status: None   Collection Time: 06/23/23 10:37 AM  Result Value Ref Range   IgE (Immunoglobulin E), Serum <2 <OR=114 kU/L  CBC w/Diff     Status: Abnormal   Collection Time: 06/23/23 10:37 AM  Result Value Ref Range   WBC 10.8 (H) 4.0 - 10.5 K/uL   RBC 4.75 3.87 - 5.11 Mil/uL   Hemoglobin 13.0 12.0 - 15.0 g/dL   HCT 16.1 09.6 - 04.5 %   MCV 85.4 78.0 - 100.0 fl   MCHC 32.0 30.0 - 36.0 g/dL   RDW 40.9 81.1 - 91.4 %   Platelets 370.0 150.0 - 400.0 K/uL   Neutrophils Relative % 62.4 43.0 - 77.0 %   Lymphocytes Relative 26.0 12.0 - 46.0 %   Monocytes Relative 7.4 3.0 - 12.0 %   Eosinophils Relative 3.0 0.0 - 5.0 %   Basophils Relative 1.2 0.0 - 3.0 %   Neutro Abs 6.7 1.4 - 7.7 K/uL   Lymphs Abs 2.8 0.7 - 4.0 K/uL   Monocytes Absolute 0.8 0.1 - 1.0 K/uL   Eosinophils Absolute 0.3 0.0 - 0.7 K/uL   Basophils Absolute 0.1 0.0 - 0.1 K/uL

## 2023-07-04 NOTE — Telephone Encounter (Signed)
Called patient to further discuss Dupixent if patient interested. Unable to reach. Left VM  Chesley Mires, PharmD, MPH, BCPS, CPP Clinical Pharmacist (Rheumatology and Pulmonology)

## 2023-07-07 ENCOUNTER — Telehealth: Payer: Self-pay | Admitting: Pharmacist

## 2023-07-07 NOTE — Telephone Encounter (Signed)
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 She states she is interested in trial of Dupixent since her breathng remains poorly controlled  Reviewed goals of treatment and that first injection will be completed in clinic. She states that she is comfortable with self-administering injectable medications.  She requested [employer] PAP application be mailed to her home  Chesley Mires, PharmD, MPH, BCPS, CPP Clinical Pharmacist (Rheumatology and Pulmonology)

## 2023-07-07 NOTE — Telephone Encounter (Signed)
Please start Dupixent for asthma-COPD overlap  Dose: 600mg  SQ at Day 0 then 300mg  SQ every 14 days  PAP printed. Pt portion mailed to pt's home and provider portion placed in Dr. Jane Canary folder for signature  Chesley Mires, PharmD, MPH, BCPS, CPP Clinical Pharmacist (Rheumatology and Pulmonology)

## 2023-07-11 NOTE — Telephone Encounter (Signed)
Submitted a Prior Authorization request to [employer]/ [employer] for DUPIXENT via CoverMyMeds. Will update once we receive a response.  Key: [id number]

## 2023-07-11 NOTE — Telephone Encounter (Signed)
CMM key not sending PA for Dupixent. Completed form and faxed to [employer]  Fax: [phone] Phone: [phone]  Chesley Mires, PharmD, MPH, BCPS, CPP Clinical Pharmacist (Rheumatology and Pulmonology)

## 2023-07-12 NOTE — Telephone Encounter (Signed)
Provider form received and placed in "awaiting response" folder.

## 2023-07-14 ENCOUNTER — Other Ambulatory Visit (HOSPITAL_COMMUNITY): Payer: Self-pay

## 2023-07-14 ENCOUNTER — Other Ambulatory Visit: Payer: Self-pay

## 2023-07-14 MED ORDER — DUPIXENT 300 MG/2ML ~~LOC~~ SOAJ
SUBCUTANEOUS | 0 refills | Status: DC
  Filled 2023-07-14: qty 8, fill #0
  Filled 2023-07-21: qty 8, 28d supply, fill #0

## 2023-07-14 NOTE — Telephone Encounter (Signed)
Received notification from [employer]/ [employer] regarding a prior authorization for DUPIXENT. Authorization has been APPROVED from 07/11/23 to 01/11/24. Approval letter sent to scan center.  Per test claim, copay for 28 days supply is $0  Patient can fill through [facility]: [phone]   Authorization # [id number] Phone # [phone]  ATC patient to schedule Dupixent new start. Unable to reach. Left VM Rx sent to [facility]. Routed to [facility] for onboarding eneds  Chesley Mires, PharmD, MPH, BCPS, CPP Clinical Pharmacist (Rheumatology and Pulmonology)

## 2023-07-19 NOTE — Telephone Encounter (Signed)
Patient scheduled for Dupixent new start on 07/25/2023 by front desk  Chesley Mires, PharmD, MPH, BCPS, CPP Clinical Pharmacist (Rheumatology and Pulmonology)

## 2023-07-21 ENCOUNTER — Other Ambulatory Visit: Payer: Self-pay

## 2023-07-21 ENCOUNTER — Emergency Department (HOSPITAL_BASED_OUTPATIENT_CLINIC_OR_DEPARTMENT_OTHER)
Admission: EM | Admit: 2023-07-21 | Discharge: 2023-07-21 | Disposition: A | Discharge status: Home / Self Care | Payer: PPO | Attending: Emergency Medicine | Admitting: Emergency Medicine

## 2023-07-21 ENCOUNTER — Encounter (HOSPITAL_BASED_OUTPATIENT_CLINIC_OR_DEPARTMENT_OTHER): Payer: Self-pay | Admitting: Emergency Medicine

## 2023-07-21 ENCOUNTER — Other Ambulatory Visit (HOSPITAL_COMMUNITY): Payer: Self-pay

## 2023-07-21 ENCOUNTER — Emergency Department (HOSPITAL_BASED_OUTPATIENT_CLINIC_OR_DEPARTMENT_OTHER): Payer: PPO

## 2023-07-21 DIAGNOSIS — Z79899 Other long term (current) drug therapy: Secondary | ICD-10-CM | POA: Insufficient documentation

## 2023-07-21 DIAGNOSIS — Z7984 Long term (current) use of oral hypoglycemic drugs: Secondary | ICD-10-CM | POA: Diagnosis not present

## 2023-07-21 DIAGNOSIS — J439 Emphysema, unspecified: Secondary | ICD-10-CM | POA: Diagnosis not present

## 2023-07-21 DIAGNOSIS — S40022A Contusion of left upper arm, initial encounter: Secondary | ICD-10-CM | POA: Diagnosis not present

## 2023-07-21 DIAGNOSIS — M79602 Pain in left arm: Secondary | ICD-10-CM | POA: Diagnosis not present

## 2023-07-21 DIAGNOSIS — E109 Type 1 diabetes mellitus without complications: Secondary | ICD-10-CM | POA: Insufficient documentation

## 2023-07-21 DIAGNOSIS — S4992XA Unspecified injury of left shoulder and upper arm, initial encounter: Secondary | ICD-10-CM | POA: Diagnosis present

## 2023-07-21 DIAGNOSIS — M79622 Pain in left upper arm: Secondary | ICD-10-CM

## 2023-07-21 DIAGNOSIS — T148XXD Other injury of unspecified body region, subsequent encounter: Secondary | ICD-10-CM | POA: Diagnosis not present

## 2023-07-21 DIAGNOSIS — J84112 Idiopathic pulmonary fibrosis: Secondary | ICD-10-CM | POA: Diagnosis not present

## 2023-07-21 DIAGNOSIS — E119 Type 2 diabetes mellitus without complications: Secondary | ICD-10-CM | POA: Diagnosis not present

## 2023-07-21 DIAGNOSIS — X58XXXA Exposure to other specified factors, initial encounter: Secondary | ICD-10-CM | POA: Diagnosis not present

## 2023-07-21 DIAGNOSIS — M79662 Pain in left lower leg: Secondary | ICD-10-CM | POA: Diagnosis not present

## 2023-07-21 DIAGNOSIS — I10 Essential (primary) hypertension: Secondary | ICD-10-CM | POA: Insufficient documentation

## 2023-07-21 DIAGNOSIS — T148XXA Other injury of unspecified body region, initial encounter: Secondary | ICD-10-CM

## 2023-07-21 MED ORDER — LORAZEPAM 1 MG PO TABS
0.5000 mg | ORAL_TABLET | Freq: Once | ORAL | Status: AC
  Administered 2023-07-21: 0.5 mg via ORAL
  Filled 2023-07-21: qty 1

## 2023-07-21 MED ORDER — HYDRALAZINE HCL 20 MG/ML IJ SOLN: 10.0000 mg | Freq: Once | INTRAMUSCULAR | Status: DC

## 2023-07-21 MED ORDER — CLONIDINE HCL 0.1 MG PO TABS
0.1000 mg | ORAL_TABLET | Freq: Once | ORAL | Status: AC
  Administered 2023-07-21: 0.1 mg via ORAL
  Filled 2023-07-21: qty 1

## 2023-07-21 MED ORDER — HYDRALAZINE HCL 10 MG PO TABS
10.0000 mg | ORAL_TABLET | Freq: Once | ORAL | Status: AC
  Administered 2023-07-21: 10 mg via ORAL
  Filled 2023-07-21: qty 1

## 2023-07-21 MED ORDER — LOSARTAN POTASSIUM 25 MG PO TABS
50.0000 mg | ORAL_TABLET | Freq: Once | ORAL | Status: AC
  Administered 2023-07-21: 50 mg via ORAL
  Filled 2023-07-21: qty 2

## 2023-07-21 NOTE — ED Provider Notes (Signed)
[place] EMERGENCY DEPARTMENT AT [facility] Provider Note   CSN: [id number] Arrival date & time: 07/21/23  1458     History  Chief Complaint  Patient presents with   Arm Swelling    Angela [name] is a 77 y.o. female, history of alpha 1 antitrypsin, DMII, who presents to the ED secondary to left arm, pain, swelling, and redness, for the last 2 days.  She states that she had her infusion for her alpha-1 antitrypsin, on Wednesday about 2 days ago, was doing well, but then the next day noticed that there is a red lump, and it was more tender to the site where her IV was.  She states since then she has been very painful.  Went to her primary care doctor, and was told to come to the ER and get an ultrasound.  She denies any kind of swelling of the extremity, which he states is tender, and has some redness to it.  Denies any shortness of breath or weakness Home Medications Prior to Admission medications   Medication Sig Start Date End Date Taking? Authorizing Provider  acetaminophen (TYLENOL) 500 MG tablet Take 500 mg by mouth every 6 (six) hours as needed.    [provider]  albuterol (PROVENTIL) (2.5 MG/3ML) 0.083% nebulizer solution Take 3 mLs (2.5 mg total) by nebulization every 6 (six) hours as needed for wheezing or shortness of breath. 05/01/23   Parrett, Virgel Bouquet, NP  Bacillus Coagulans-Inulin (PROBIOTIC-PREBIOTIC PO) Take 2 tablets by mouth in the morning.    [provider]  benzonatate (TESSALON) 100 MG capsule Take 100 mg by mouth 3 (three) times daily as needed. 04/28/23   [provider]  brimonidine (ALPHAGAN) 0.15 % ophthalmic solution Place 1 drop into both eyes in the morning and at bedtime. 07/12/17   [provider]  buPROPion (WELLBUTRIN XL) 300 MG 24 hr tablet Take 300 mg by mouth in the morning. 05/04/16   [provider]  Camphor-Eucalyptus-Menthol (VICKS VAPORUB) 4.73-1.2-2.6 % OINT Apply 1 application topically at bedtime.     [provider]  cyclobenzaprine (FLEXERIL) 10 MG tablet Take 10 mg by mouth at bedtime as needed for muscle spasms.  06/09/15   [provider]  dimethicone (REMEDY NUTRASHIELD) 1 % cream Apply 1 application topically 2 (two) times daily as needed for dry skin.    [provider]  doxycycline (VIBRA-TABS) 100 MG tablet Take 100 mg by mouth 2 (two) times daily. Patient not taking: Reported on 06/23/2023 04/28/23   [provider]  doxycycline (VIBRA-TABS) 100 MG tablet Take 1 tablet (100 mg total) by mouth 2 (two) times daily. 06/23/23   Kalman Shan, MD  Dupilumab (DUPIXENT) 300 MG/2ML SOPN Inject 600mg  into the skina at Day 0 then 300mg  into the skin every 14 days thereafter. Courier to pulm: [address], [place] [address]. Appt on 07/25/23 07/14/23   Kalman Shan, MD  Glycopyrrolate-Formoterol (BEVESPI AEROSPHERE) 9-4.8 MCG/ACT AERO Inhale 2 puffs into the lungs 2 (two) times daily. 01/26/23   Kalman Shan, MD  hydrochlorothiazide (HYDRODIURIL) 12.5 MG tablet Take 12.5 mg by mouth every morning. 08/30/21   [provider]  latanoprost (XALATAN) 0.005 % ophthalmic solution Place 1 drop into the left eye at bedtime. Left eye    [provider]  losartan (COZAAR) 50 MG tablet Take 50 mg by mouth in the morning. 11/24/19   [provider]  meclizine (ANTIVERT) 12.5 MG tablet Take 1 tablet (  12.5 mg total) by mouth 3 (three) times daily as needed for dizziness. 12/16/18   Palumbo, April, MD  Menthol (ICY HOT) 5 % [facility] Place 1 patch onto the skin daily as needed (back pain.).    [provider]  Menthol, Topical Analgesic, (BIOFREEZE EX) Apply 1 application topically 3 (three) times daily as needed (back pain.).    [provider]  metFORMIN (GLUCOPHAGE-XR) 500 MG 24 hr tablet Take 1,000 mg by mouth in the morning and at bedtime. 01/25/18   [provider]  metoprolol succinate (TOPROL-XL) 25  MG 24 hr tablet TAKE 1 TABLET(25 MG) BY MOUTH DAILY 05/22/17   Jake Bathe, MD  Multiple Vitamin (MULTIVITAMIN WITH MINERALS) TABS tablet Take 1 tablet by mouth in the morning.    [provider]  omeprazole (PRILOSEC) 40 MG capsule TAKE 1 CAPSULE(40 MG) BY MOUTH TWICE DAILY BEFORE LUNCH AND SUPPER 05/04/20   Mansouraty, Netty Starring., MD  OVER THE COUNTER MEDICATION Take 1 capsule by mouth daily as needed (congestion). Serrapeptase Capsules    [provider]  predniSONE (DELTASONE) 10 MG tablet 4 tabs for 2 days, then 3 tabs for 2 days, 2 tabs for 2 days, then 1 tab for 2 days, then stop 04/04/22   Parrett, Tammy S, NP  predniSONE (DELTASONE) 10 MG tablet Take 1 tablet (10 mg total) by mouth daily with breakfast. 06/23/23   Kalman Shan, MD  PROLASTIN-C 1000 MG/20ML SOLN Inject 1,000 mg into the vein every Tuesday. 09/14/21   [provider]  rosuvastatin (CRESTOR) 5 MG tablet Take 5 mg by mouth daily.    [provider]  sertraline (ZOLOFT) 50 MG tablet Take 50 mg by mouth in the morning.    [provider]  Tiotropium Bromide-Olodaterol (STIOLTO RESPIMAT) 2.5-2.5 MCG/ACT AERS Inhale 2 puffs into the lungs daily. Patient not taking: Reported on 06/23/2023 01/20/21   Parrett, Virgel Bouquet, NP  Tiotropium Bromide-Olodaterol 2.5-2.5 MCG/ACT AERS Inhale 2 puffs into the lungs daily. Patient not taking: Reported on 06/23/2023 01/26/23   Kalman Shan, MD  vitamin B-12 (CYANOCOBALAMIN) 1000 MCG tablet Take 1,000 mcg by mouth in the morning.    [provider]      Allergies    Tizanidine, Influenza vaccines, Other, Vicodin hp [hydrocodone-acetaminophen], Betadine [povidone iodine], Ciprofloxacin, Codeine, and Iohexol    Review of Systems   Review of Systems  Cardiovascular:  Negative for leg swelling.  Skin:  Positive for rash.    Physical Exam Updated Vital Signs BP (!) 227/87   Pulse 60   Temp 97.9 F (36.6 C)   Resp 16   LMP  (LMP Unknown)    SpO2 97%  Physical Exam Vitals and nursing note reviewed.  Constitutional:      General: She is not in acute distress.    Appearance: She is well-developed.  HENT:     Head: Normocephalic and atraumatic.  Eyes:     Conjunctiva/sclera: Conjunctivae normal.  Cardiovascular:     Rate and Rhythm: Normal rate and regular rhythm.     Heart sounds: No murmur heard. Pulmonary:     Effort: Pulmonary effort is normal. No respiratory distress.     Breath sounds: Normal breath sounds.  Abdominal:     Palpations: Abdomen is soft.     Tenderness: There is no abdominal tenderness.  Musculoskeletal:        General: No swelling.     Cervical back: Neck supple.  Skin:    General: Skin  is warm and dry.     Capillary Refill: Capillary refill takes less than 2 seconds.     Comments: Mild erythematous patch, and palpable cord, to the left cubital fossa with mild tenderness to palpation.  No edema of the left upper extremity.  Neurological:     Mental Status: She is alert.  Psychiatric:        Mood and Affect: Mood normal.     ED Results / Procedures / Treatments   Labs (all labs ordered are listed, but only abnormal results are displayed) Labs Reviewed - No data to display  EKG None  Radiology US Venous Img Upper Uni Left  Result Date: 07/21/2023 CLINICAL DATA:  pain EXAM: LEFT UPPER EXTREMITY VENOUS DOPPLER ULTRASOUND TECHNIQUE: Gray-scale sonography with graded compression, as well as color Doppler and duplex ultrasound were performed to evaluate the upper extremity deep venous system from the level of the subclavian vein and including the jugular, axillary, basilic, radial, ulnar and upper cephalic vein. Spectral Doppler was utilized to evaluate flow at rest and with distal augmentation maneuvers. COMPARISON:  CT C-spine, 12/21/2022 FINDINGS: VENOUS Normal compressibility of the LEFT internal jugular, subclavian, axillary, cephalic, basilic, brachial, radial and ulnar veins. No filling  defects to suggest DVT on grayscale or color Doppler imaging. Doppler waveforms show normal direction of venous flow, normal respiratory plasticity and response to augmentation. Limited views of the contralateral subclavian vein are unremarkable. OTHER No evidence of superficial thrombophlebitis or abnormal fluid collection. Freddie Dymek sub-1 cm, ovoid subcutaneous hyperechoic area without internal vascularity is noted at the medial forearm soft tissues. Limitations: none IMPRESSION: 1. No evidence of femoropopliteal DVT or superficial thrombophlebitis within the LEFT upper extremity. 2. sub-1 cm nonvascular subcutaneous lesion, likely a Kanae Ignatowski hematoma. Roanna Banning, MD [facility] Radiology Electronically Signed   By: Roanna Banning M.D.   On: 07/21/2023 18:33    Procedures Procedures    Medications Ordered in ED Medications  LORazepam (ATIVAN) tablet 0.5 mg (has no administration in time range)  losartan (COZAAR) tablet 50 mg (50 mg Oral Given 07/21/23 1811)  cloNIDine (CATAPRES) tablet 0.1 mg (0.1 mg Oral Given 07/21/23 1831)    ED Course/ Medical Decision Making/ A&P                                 Medical Decision Making Patient is a 77 year old female, here for left arm pain, after getting an infusion 2 days ago.  She states she noticed it was swollen yesterday, and has persistently been swollen.  She was told by an nurse practitioner to come to the ER to evaluate for a blood clot.  We will obtain an ultrasound of this area.  She has a palpable cord, and mild erythema to the site, and suspicious for hematoma versus thrombophlebitis.  Will obtain ultrasound for this.  There is no warmth to the site, thus I think it is unlikely to be cellulitic in nature  Amount and/or Complexity of Data Reviewed Radiology:     Details: Ultrasound shows a 1 cm nodule, likely to represent a hematoma.  No evidence of DVT Discussion of management or test interpretation with  external provider(s): Discussed with patient, no evidence of DVT, she was found to be hypertensive, she states she did not take her blood pressure medication, I gave her losartan, as well as clonidine, as her blood pressure is over 200, she has no symptoms  such as chest pain, shortness of breath, headache at this time.  I informed her that she likely has a hematoma from the IV, and to use compresses, ice, as well as elevate her arm for this.  We are waiting on her blood pressure to go down at this time, I discussed with with Dr. Lockie Mola, he will follow-up on this.  Risk Prescription drug management.    Final Clinical Impression(s) / ED Diagnoses Final diagnoses:  Hypertension, unspecified type  Left upper arm pain  Hematoma    Rx / DC Orders ED Discharge Orders     None         Aseneth Hack, Harley Alto, PA 07/21/23 1916    Virgina Norfolk, DO 07/21/23 2229

## 2023-07-21 NOTE — Telephone Encounter (Signed)
Delivery instructions have been updated in [place], medication will be couriered to [facility] on 07/25/23.  Rx has been processed in [facility] and the patient has no copay at this time.

## 2023-07-21 NOTE — Discharge Instructions (Signed)
You have a bruised your arm from your IV, use ice, compresses, and elevate your arm for this.  You can also take Tylenol for this pain.  Your blood pressure was high today, this may be secondary to missing your blood pressure medicine this morning.  We gave you your blood pressure medicine for this morning, as well as another blood pressure medicine.  Be mindful, and if you feel lightheaded, dizzy please take your blood pressure, and return to the ER.  There is no evidence of infection on your exam today however if the area becomes more red, swollen or tender to the touch return to the ER

## 2023-07-21 NOTE — ED Triage Notes (Signed)
Received her weekly prolastin-c infusion yesterday. Today notice some bumps/swelling on her arm. Pt is concerned about a blood clot in arm

## 2023-07-25 ENCOUNTER — Ambulatory Visit: Payer: PPO | Admitting: Pharmacist

## 2023-07-25 ENCOUNTER — Other Ambulatory Visit: Payer: Self-pay

## 2023-07-25 ENCOUNTER — Other Ambulatory Visit (HOSPITAL_COMMUNITY): Payer: Self-pay

## 2023-07-25 DIAGNOSIS — J441 Chronic obstructive pulmonary disease with (acute) exacerbation: Secondary | ICD-10-CM

## 2023-07-25 DIAGNOSIS — Z7189 Other specified counseling: Secondary | ICD-10-CM

## 2023-07-25 MED ORDER — DUPIXENT 300 MG/2ML ~~LOC~~ SOAJ
300.0000 mg | SUBCUTANEOUS | 1 refills | Status: DC
  Filled 2023-07-25: qty 12, 84d supply, fill #0
  Filled 2023-08-28: qty 4, 28d supply, fill #0
  Filled 2023-09-19: qty 4, 28d supply, fill #1
  Filled 2023-10-15 – 2023-10-24 (×2): qty 4, 28d supply, fill #2
  Filled 2023-11-14 – 2023-12-06 (×3): qty 4, 28d supply, fill #3

## 2023-07-25 NOTE — Progress Notes (Signed)
HPI Patient presents today to [facility] to see pharmacy team for Dupixent new start.  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 Actually went to ED last week due to possible localized infusion reaction to Prolastin - was discharged after symptom improvement with Benadryl and topical steroids. 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 Her sister is a carrier for A1AT deficiency. Patient states that her children have not yet been willing to be tested for A1AT deficiency  Respiratory Medications Current regimen: Bevespi 2 puffs twice daily, Prolastin IV once weekly Patient reports no known adherence challenges - definitely some issues in past with Prolastin infusions due to cost, nursing communication, etc  OBJECTIVE Allergies  Allergen Reactions   Tizanidine Other (See Comments)    Severe decrease in BP    Influenza Vaccines Other (See Comments)    becomes sick with the flu   Other     PATIENT STATES SHE CANNOT TAKE ANY VACCINATIONS PER HER PHYSICIAN   Vicodin Hp [Hydrocodone-Acetaminophen] Nausea And Vomiting   Betadine [Povidone Iodine] Rash   Ciprofloxacin Rash   Codeine Nausea And Vomiting   Iohexol      Desc: Pt states several years ago during a CT scan w/ iv cotnrast she had severe nausea and vomiting, sob w/ difficulty breathing and swallowing.  She had full 13hr. premeds today (5/14/7) and did fine w/o complications.     Outpatient Encounter Medications as of 07/25/2023  Medication Sig   acetaminophen (TYLENOL) 500 MG tablet Take 500 mg by mouth every 6 (six) hours as needed.   albuterol (PROVENTIL) (2.5 MG/3ML) 0.083% nebulizer solution Take 3 mLs (2.5 mg total) by nebulization every 6 (six) hours as needed for wheezing or shortness of breath.   Bacillus Coagulans-Inulin  (PROBIOTIC-PREBIOTIC PO) Take 2 tablets by mouth in the morning.   benzonatate (TESSALON) 100 MG capsule Take 100 mg by mouth 3 (three) times daily as needed.   brimonidine (ALPHAGAN) 0.15 % ophthalmic solution Place 1 drop into both eyes in the morning and at bedtime.   buPROPion (WELLBUTRIN XL) 300 MG 24 hr tablet Take 300 mg by mouth in the morning.   Camphor-Eucalyptus-Menthol (VICKS VAPORUB) 4.73-1.2-2.6 % OINT Apply 1 application topically at bedtime.   cyclobenzaprine (FLEXERIL) 10 MG tablet Take 10 mg by mouth at bedtime as needed for muscle spasms.    dimethicone (REMEDY NUTRASHIELD) 1 % cream Apply 1 application topically 2 (two) times daily as needed for dry skin.   doxycycline (VIBRA-TABS) 100 MG tablet Take 100 mg by mouth 2 (two) times daily. (Patient not taking: Reported on 06/23/2023)   doxycycline (VIBRA-TABS) 100 MG tablet Take 1 tablet (100 mg total) by mouth 2 (two) times daily.   Dupilumab (DUPIXENT) 300 MG/2ML SOPN Inject 600mg  into the skina at Day 0 then 300mg  into the skin every 14 days thereafter. Courier to pulm: [address], [address], [place] [address]. Appt on 07/25/23   Glycopyrrolate-Formoterol (BEVESPI AEROSPHERE) 9-4.8 MCG/ACT AERO Inhale 2 puffs into the lungs 2 (two) times daily.   hydrochlorothiazide (HYDRODIURIL) 12.5 MG tablet Take 12.5 mg by mouth every morning.   latanoprost (XALATAN) 0.005 % ophthalmic solution Place 1 drop into the left eye at bedtime. Left eye   losartan (COZAAR) 50 MG tablet Take 50 mg by mouth in the morning.   meclizine (ANTIVERT)  12.5 MG tablet Take 1 tablet (12.5 mg total) by mouth 3 (three) times daily as needed for dizziness.   Menthol (ICY HOT) 5 % PTCH Place 1 patch onto the skin daily as needed (back pain.).   Menthol, Topical Analgesic, (BIOFREEZE EX) Apply 1 application topically 3 (three) times daily as needed (back pain.).   metFORMIN (GLUCOPHAGE-XR) 500 MG 24 hr tablet Take 1,000 mg by mouth in the morning and at  bedtime.   metoprolol succinate (TOPROL-XL) 25 MG 24 hr tablet TAKE 1 TABLET(25 MG) BY MOUTH DAILY   Multiple Vitamin (MULTIVITAMIN WITH MINERALS) TABS tablet Take 1 tablet by mouth in the morning.   omeprazole (PRILOSEC) 40 MG capsule TAKE 1 CAPSULE(40 MG) BY MOUTH TWICE DAILY BEFORE LUNCH AND SUPPER   OVER THE COUNTER MEDICATION Take 1 capsule by mouth daily as needed (congestion). Serrapeptase Capsules   predniSONE (DELTASONE) 10 MG tablet 4 tabs for 2 days, then 3 tabs for 2 days, 2 tabs for 2 days, then 1 tab for 2 days, then stop   predniSONE (DELTASONE) 10 MG tablet Take 1 tablet (10 mg total) by mouth daily with breakfast.   PROLASTIN-C 1000 MG/20ML SOLN Inject 1,000 mg into the vein every Tuesday.   rosuvastatin (CRESTOR) 5 MG tablet Take 5 mg by mouth daily.   sertraline (ZOLOFT) 50 MG tablet Take 50 mg by mouth in the morning.   Tiotropium Bromide-Olodaterol (STIOLTO RESPIMAT) 2.5-2.5 MCG/ACT AERS Inhale 2 puffs into the lungs daily. (Patient not taking: Reported on 06/23/2023)   Tiotropium Bromide-Olodaterol 2.5-2.5 MCG/ACT AERS Inhale 2 puffs into the lungs daily. (Patient not taking: Reported on 06/23/2023)   vitamin B-12 (CYANOCOBALAMIN) 1000 MCG tablet Take 1,000 mcg by mouth in the morning.   No facility-administered encounter medications on file as of 07/25/2023.     Immunization History  Administered Date(s) Administered   Influenza, High Dose Seasonal PF 09/13/2017, 08/14/2020   Moderna Sars-Covid-2 Vaccination 12/16/2019, 01/13/2020, 09/22/2020, 06/09/2021   Pneumococcal Conjugate-13 10/15/2014   Pneumococcal-Unspecified 11/14/2012   Tdap 09/05/2016, 12/21/2022   Zoster, Live 07/16/2021     PFTs    Latest Ref Rng & Units 08/23/2021    1:33 PM 02/14/2018    9:14 AM 09/13/2017    9:05 AM 06/08/2017    8:48 AM 08/16/2016   10:21 AM 03/04/2016   12:53 PM 08/28/2015    4:37 PM  PFT Results  FVC-Pre L 2.60  2.44  2.45  2.63  2.69  2.79  2.81   FVC-Predicted Pre % 78  70   70  75  76  79  79   FVC-Post L      2.84  2.75   FVC-Predicted Post %      81  77   Pre FEV1/FVC % % 80  78  77  75  79  75  75   Post FEV1/FCV % %      77  79   FEV1-Pre L 2.09  1.89  1.90  1.97  2.11  2.10  2.10   FEV1-Predicted Pre % 83  72  72  74  79  78  77   FEV1-Post L      2.20  2.16   DLCO uncorrected ml/min/mmHg 12.64  12.16  12.31  13.31  14.10  14.76  15.56   DLCO UNC% % 58  41  42  45  48  50  53   DLCO corrected ml/min/mmHg 12.64   12.35  13.43  13.90  15.20    DLCO COR %Predicted % 58   42  46  47  52    DLVA Predicted % 80  64  68  63  61  66  70   TLC L      4.85  4.11   TLC % Predicted %      86  73   RV % Predicted %      84  59     Eosinophils Most recent blood eosinophil count was 300 cells/microL taken on 06/23/2023.   IgE: <2 on 06/23/2023  Assessment   Biologics training for dupilumab (Dupixent)  Goals of therapy: Mechanism: human monoclonal IgG4 antibody that inhibits interleukin-4 and interleukin-13 cytokine-induced responses, including release of proinflammatory cytokines, chemokines, and IgE Reviewed that Dupixent is add-on medication and patient must continue maintenance inhaler regimen. Response to therapy: may take 4 months to determine efficacy. Discussed that patients generally feel improvement sooner than 4 months.  Side effects: injection site reaction (6-18%), antibody development (5-16%), ophthalmic conjunctivitis (2-16%), transient blood eosinophilia (1-2%)  Dose: 600mg  at Week 0 (administered today in clinic) followed by 300mg  every 14 days thereafter  Administration/Storage:  Reviewed administration sites of thigh or abdomen (at least 2-3 inches away from abdomen). Reviewed the upper arm is only appropriate if caregiver is administering injection  Do not shake pen/syringe as this could lead to product foaming or precipitation. Do not use if solution is discolored or contains particulate matter or if window on prefilled pen is yellow  (indicates pen has been used).  Reviewed storage of medication in refrigerator. Reviewed that Dupixent can be stored at room temperature in unopened carton for up to 14 days.  Access: Approval of Dupixent through: insurance  Patient self-administered Dupixent 300mg /61ml x 2 (total dose 600mg ) in right lower abdomen and left lower abdomen using sample Dupixent 300mg /58mL autoinjector pen NDC: [phone] Lot: [id number] Expiration: 03/13/2025  Patient monitored for 30 minutes for adverse reaction.  Patient tolerated well.  Patient denies itchiness and irritation at injection. and Reviewed injection site reaction management with patient verbally and printed information for review in AVS  Medication Reconciliation  A drug regimen assessment was performed, including review of allergies, interactions, disease-state management, dosing and immunization history. Medications were reviewed with the patient, including name, instructions, indication, goals of therapy, potential side effects, importance of adherence, and safe use.  Drug interaction(s): none noted  PLAN Continue Dupixent 300mg  every 14 days.  Next dose is due 08/08/23 and every 14 days thereafter. Rx sent to: [facility]: [phone] .  Patient provided with pharmacy phone number and advised that they will call to schedule shipment to home.  Continue maintenance asthma regimen of: Bevespi 9-4.58mcg (2 puffs twice daily), Prolastin IV infusions once weekly (home infusions)  All questions encouraged and answered.  Instructed patient to reach out with any further questions or concerns.  Thank you for allowing pharmacy to participate in this patient's care.  This appointment required 60 minutes of patient care (this includes precharting, chart review, review of results, face-to-face care, etc.).   Chesley Mires, PharmD, MPH, BCPS, CPP Clinical Pharmacist (Rheumatology and Pulmonology)

## 2023-07-25 NOTE — Patient Instructions (Signed)
Your next DUPIXENT dose is due on 08/08/23, 08/22/23, and every 14 days thereafter  CONTINUE Bevespi 2 puffs twice daily  Your prescription will be shipped from [facility]. Their phone number is [phone]. They will call to schedule shipment and confirm address. They will mail your medication to your home.  You will need to be seen by your provider in 3 to 4 months to assess how DUPIXENT is working for you. Please ensure you have a follow-up appointment scheduled in December 2024 and January 2025. Call our clinic if you need to make this appointment.  How to manage an injection site reaction: Remember the 5 C's: COUNTER - leave on the counter at least 30 minutes but up to overnight to bring medication to room temperature. This may help prevent stinging COLD - place something cold (like an ice gel pack or cold water bottle) on the injection site just before cleansing with alcohol. This may help reduce pain CLARITIN - use Claritin (generic name is loratadine) for the first two weeks of treatment or the day of, the day before, and the day after injecting. This will help to minimize injection site reactions CORTISONE CREAM - apply if injection site is irritated and itching CALL ME - if injection site reaction is bigger than the size of your fist, looks infected, blisters, or if you develop hives

## 2023-07-26 ENCOUNTER — Other Ambulatory Visit: Payer: Self-pay

## 2023-08-04 DIAGNOSIS — Z09 Encounter for follow-up examination after completed treatment for conditions other than malignant neoplasm: Secondary | ICD-10-CM | POA: Diagnosis not present

## 2023-08-04 DIAGNOSIS — Z872 Personal history of diseases of the skin and subcutaneous tissue: Secondary | ICD-10-CM | POA: Diagnosis not present

## 2023-08-07 DIAGNOSIS — Z0000 Encounter for general adult medical examination without abnormal findings: Secondary | ICD-10-CM | POA: Diagnosis not present

## 2023-08-07 DIAGNOSIS — J439 Emphysema, unspecified: Secondary | ICD-10-CM | POA: Diagnosis not present

## 2023-08-07 DIAGNOSIS — N183 Chronic kidney disease, stage 3 unspecified: Secondary | ICD-10-CM | POA: Diagnosis not present

## 2023-08-07 DIAGNOSIS — N390 Urinary tract infection, site not specified: Secondary | ICD-10-CM | POA: Diagnosis not present

## 2023-08-07 DIAGNOSIS — J84112 Idiopathic pulmonary fibrosis: Secondary | ICD-10-CM | POA: Diagnosis not present

## 2023-08-07 DIAGNOSIS — I700 Atherosclerosis of aorta: Secondary | ICD-10-CM | POA: Diagnosis not present

## 2023-08-07 DIAGNOSIS — E1122 Type 2 diabetes mellitus with diabetic chronic kidney disease: Secondary | ICD-10-CM | POA: Diagnosis not present

## 2023-08-07 DIAGNOSIS — Z9181 History of falling: Secondary | ICD-10-CM | POA: Diagnosis not present

## 2023-08-07 DIAGNOSIS — R109 Unspecified abdominal pain: Secondary | ICD-10-CM | POA: Diagnosis not present

## 2023-08-07 DIAGNOSIS — E8801 Alpha-1-antitrypsin deficiency: Secondary | ICD-10-CM | POA: Diagnosis not present

## 2023-08-07 DIAGNOSIS — E785 Hyperlipidemia, unspecified: Secondary | ICD-10-CM | POA: Diagnosis not present

## 2023-08-07 DIAGNOSIS — Z79899 Other long term (current) drug therapy: Secondary | ICD-10-CM | POA: Diagnosis not present

## 2023-08-18 ENCOUNTER — Ambulatory Visit (HOSPITAL_BASED_OUTPATIENT_CLINIC_OR_DEPARTMENT_OTHER)
Admission: RE | Admit: 2023-08-18 | Discharge: 2023-08-18 | Disposition: A | Discharge status: Home / Self Care | Payer: PPO | Source: Ambulatory Visit | Attending: Internal Medicine | Admitting: Internal Medicine

## 2023-08-18 ENCOUNTER — Other Ambulatory Visit: Payer: Self-pay

## 2023-08-18 DIAGNOSIS — I700 Atherosclerosis of aorta: Secondary | ICD-10-CM | POA: Diagnosis not present

## 2023-08-18 DIAGNOSIS — J432 Centrilobular emphysema: Secondary | ICD-10-CM | POA: Diagnosis not present

## 2023-08-18 DIAGNOSIS — J84112 Idiopathic pulmonary fibrosis: Secondary | ICD-10-CM | POA: Insufficient documentation

## 2023-08-18 NOTE — Progress Notes (Signed)
Initial Fill and Initial Clinical Assessment completed in [place]. LD given in Clinic on 07/25/23. Next refill due around 09/05/23. Per clinic, patient is ready for home administration.

## 2023-08-25 ENCOUNTER — Other Ambulatory Visit (HOSPITAL_COMMUNITY): Payer: Self-pay

## 2023-08-28 ENCOUNTER — Other Ambulatory Visit: Payer: Self-pay

## 2023-08-28 ENCOUNTER — Other Ambulatory Visit (HOSPITAL_COMMUNITY): Payer: Self-pay

## 2023-08-28 NOTE — Progress Notes (Signed)
Specialty Pharmacy Refill Coordination Note  Angela [name] is a 77 y.o. female contacted today regarding refills of specialty medication(s) Dupilumab   Patient requested Delivery   Delivery date: 08/29/23   Verified address: [address] [place] [address]   Medication will be filled on 08/28/23.

## 2023-09-08 ENCOUNTER — Other Ambulatory Visit: Payer: Self-pay

## 2023-09-18 ENCOUNTER — Other Ambulatory Visit: Payer: Self-pay

## 2023-09-19 ENCOUNTER — Other Ambulatory Visit: Payer: Self-pay

## 2023-09-19 NOTE — Progress Notes (Signed)
Specialty Pharmacy Refill Coordination Note  Angela [name] is a 77 y.o. female contacted today regarding refills of specialty medication(s) Dupilumab   Patient requested Delivery   Delivery date: 09/21/23   Verified address: [address] [place] [address]   Medication will be filled on 09/20/23.

## 2023-10-09 DIAGNOSIS — F411 Generalized anxiety disorder: Secondary | ICD-10-CM | POA: Diagnosis not present

## 2023-10-09 DIAGNOSIS — E119 Type 2 diabetes mellitus without complications: Secondary | ICD-10-CM | POA: Diagnosis not present

## 2023-10-09 DIAGNOSIS — M519 Unspecified thoracic, thoracolumbar and lumbosacral intervertebral disc disorder: Secondary | ICD-10-CM | POA: Diagnosis not present

## 2023-10-10 ENCOUNTER — Telehealth: Payer: Self-pay | Admitting: Internal Medicine

## 2023-10-10 NOTE — Telephone Encounter (Signed)
[facility] home health needs plan of treatment faxed back to them so they can care for the patient.

## 2023-10-11 ENCOUNTER — Other Ambulatory Visit (HOSPITAL_COMMUNITY): Payer: Self-pay

## 2023-10-16 ENCOUNTER — Other Ambulatory Visit (HOSPITAL_COMMUNITY): Payer: Self-pay

## 2023-10-16 ENCOUNTER — Encounter (HOSPITAL_COMMUNITY): Payer: Self-pay

## 2023-10-18 ENCOUNTER — Other Ambulatory Visit (HOSPITAL_COMMUNITY): Payer: Self-pay

## 2023-10-24 ENCOUNTER — Other Ambulatory Visit (HOSPITAL_COMMUNITY): Payer: Self-pay | Admitting: Pharmacy Technician

## 2023-10-24 ENCOUNTER — Other Ambulatory Visit (HOSPITAL_COMMUNITY): Payer: Self-pay

## 2023-10-24 NOTE — Progress Notes (Signed)
Specialty Pharmacy Refill Coordination Note  Angela [name] is a 77 y.o. female contacted today regarding refills of specialty medication(s) Dupilumab   Patient requested Delivery   Delivery date: 10/26/23   Verified address: [address] [place]   Medication will be filled on 10/25/23.

## 2023-10-25 ENCOUNTER — Other Ambulatory Visit: Payer: Self-pay

## 2023-11-14 ENCOUNTER — Other Ambulatory Visit (HOSPITAL_COMMUNITY): Payer: Self-pay | Admitting: Pharmacy Technician

## 2023-11-14 ENCOUNTER — Other Ambulatory Visit (HOSPITAL_COMMUNITY): Payer: Self-pay

## 2023-11-14 NOTE — Progress Notes (Signed)
 Specialty Pharmacy Refill Coordination Note  Angela [name] is a 77 y.o. female contacted today regarding refills of specialty medication(s) Dupilumab  (Dupixent )   Patient requested Delivery   Delivery date: 11/17/23   Verified address: [address] [place]   Medication will be filled on 11/16/23.

## 2023-11-16 ENCOUNTER — Other Ambulatory Visit: Payer: Self-pay

## 2023-11-17 ENCOUNTER — Other Ambulatory Visit (HOSPITAL_COMMUNITY): Payer: Self-pay

## 2023-11-17 ENCOUNTER — Telehealth: Payer: Self-pay

## 2023-11-17 NOTE — Telephone Encounter (Signed)
 I called [facility] and spoke with Angela [name]. Angela [name] states this was from a long time ago and has already be taken care of. Nothing further needed.

## 2023-11-17 NOTE — Telephone Encounter (Signed)
 Received notification from [facility] that pt's copay for her Dupixent  is $1,203.43, and a notification appears that states pt would likely benefit from [facility] Prescription Payment Plan.   Contacted pt and discussed, I explained how the Prescription Payment Plan works and that enrollment has also become a requirement for some patients who are enrolled in the Dupixent  MyWay program. She states that she is about to move and simply can't afford another payment at this time and is not interested in enrolling into the payment plan.  Informed pt that we will investigate potential grant openings to help her continue treatment. Pt verbalized appreciation.

## 2023-11-27 ENCOUNTER — Other Ambulatory Visit: Payer: Self-pay

## 2023-11-27 NOTE — Progress Notes (Signed)
 Copayment was too high for patient ($4098.11) and per [facility] she is not interested in payment plan. He will look into possible grants but ok'd [place] to profile rx for now.

## 2023-11-28 ENCOUNTER — Telehealth: Payer: Self-pay | Admitting: Internal Medicine

## 2023-11-28 NOTE — Telephone Encounter (Signed)
 Patient will be moving to [facility] and needs to be referred to a new pulmonologist in the area. Call back number [phone]

## 2023-11-30 NOTE — Telephone Encounter (Signed)
Patient advised to contact her insurance company and then call back with information then referral can be placed. She will also be changing insurances as well. NFN

## 2023-12-01 NOTE — Telephone Encounter (Signed)
Attempted to contact pt to discuss, left VoiceMail requesting she bring income documents to the clinic on Monday if she is able.

## 2023-12-01 NOTE — Telephone Encounter (Signed)
Attempted to enroll patient into PAF grant for asthma: It is important for you to know that the CPR program verifies all applicants' information through a third-party income verification service to determine patient eligibility. The Automated Income Verification system was unable to verify the information reported on the application.  To review the application for eligibility, we will need to receive additional income documentation verifying your/the patient's household income along with documentation verifying your/the patient's identity.  If we do not receive the required documentation within 30 days of the application, we will not be able to process the application through our system.  Approvals are first come first served and based on funding availability.  Chesley Mires, PharmD, MPH, BCPS, CPP Clinical Pharmacist (Rheumatology and Pulmonology)

## 2023-12-04 ENCOUNTER — Telehealth: Payer: Self-pay | Admitting: Pharmacist

## 2023-12-04 DIAGNOSIS — E8801 Alpha-1-antitrypsin deficiency: Secondary | ICD-10-CM

## 2023-12-04 NOTE — Telephone Encounter (Signed)
Attempted to provide verbal for Prolastin referral however rep had me on hold and then hung up line. If rep returns calls, please take callback number so I may return their call when back in office

## 2023-12-04 NOTE — Telephone Encounter (Signed)
Angela [name] phone number is [phone]. Reference number is [id number].

## 2023-12-04 NOTE — Telephone Encounter (Signed)
Patient states [employer] requested income information. Patient is returning phone call. Patient phone number is [phone].

## 2023-12-05 ENCOUNTER — Other Ambulatory Visit (HOSPITAL_COMMUNITY): Payer: Self-pay

## 2023-12-06 ENCOUNTER — Other Ambulatory Visit: Payer: Self-pay

## 2023-12-06 ENCOUNTER — Ambulatory Visit: Payer: PPO | Admitting: Adult Health

## 2023-12-08 NOTE — Telephone Encounter (Signed)
Pt has relocated to [place], [place] and is no longer be managed by our clinic. Closing encounter.

## 2023-12-13 ENCOUNTER — Telehealth: Payer: Self-pay | Admitting: Internal Medicine

## 2023-12-13 NOTE — Telephone Encounter (Signed)
Patient received patient advocate form in the mail (maybe for Dupixent or Prolastin not sure)call back number [phone]

## 2023-12-14 ENCOUNTER — Other Ambulatory Visit (HOSPITAL_COMMUNITY): Payer: Self-pay

## 2023-12-14 ENCOUNTER — Telehealth: Payer: Self-pay

## 2023-12-14 NOTE — Telephone Encounter (Addendum)
Returned call - they received faxed rx already for patient's Prolastin. NFN  Chesley Mires, PharmD, MPH, BCPS, CPP Clinical Pharmacist (Rheumatology and Pulmonology)

## 2023-12-14 NOTE — Telephone Encounter (Signed)
Pt called clinic and stated that she has received correspondence from PAF about grant assistance. She has submitted everything that was required of her, however was unable to submit documentation of her diagnosis or a prescription. Pt has had delays and has not yet moved to [place] and is therefore still technically under the care of this clinic. Routing to [facility] for prescription submission.Marland Kitchen

## 2024-01-09 ENCOUNTER — Telehealth: Payer: Self-pay | Admitting: Internal Medicine

## 2024-01-09 NOTE — Telephone Encounter (Signed)
 Victorino Dike checking on fax sent today, For nursing orders. Victorino Dike phone number is [phone].

## 2024-02-13 ENCOUNTER — Other Ambulatory Visit (HOSPITAL_COMMUNITY): Payer: Self-pay

## 2024-02-20 NOTE — Telephone Encounter (Signed)
 Angela [name]

## 2024-02-21 NOTE — Telephone Encounter (Signed)
 Forms for [facility] were faxed already- refer to media section- it's been scanned in. Closing encounter.

## 2024-02-22 DIAGNOSIS — I700 Atherosclerosis of aorta: Secondary | ICD-10-CM | POA: Diagnosis not present

## 2024-02-22 DIAGNOSIS — I10 Essential (primary) hypertension: Secondary | ICD-10-CM | POA: Diagnosis not present

## 2024-02-22 DIAGNOSIS — J439 Emphysema, unspecified: Secondary | ICD-10-CM | POA: Diagnosis not present

## 2024-02-22 DIAGNOSIS — E1122 Type 2 diabetes mellitus with diabetic chronic kidney disease: Secondary | ICD-10-CM | POA: Diagnosis not present

## 2024-02-22 DIAGNOSIS — F330 Major depressive disorder, recurrent, mild: Secondary | ICD-10-CM | POA: Diagnosis not present

## 2024-02-22 DIAGNOSIS — I2510 Atherosclerotic heart disease of native coronary artery without angina pectoris: Secondary | ICD-10-CM | POA: Diagnosis not present

## 2024-02-22 DIAGNOSIS — J8410 Pulmonary fibrosis, unspecified: Secondary | ICD-10-CM | POA: Diagnosis not present

## 2024-03-26 ENCOUNTER — Other Ambulatory Visit: Payer: Self-pay

## 2024-03-27 ENCOUNTER — Other Ambulatory Visit: Payer: Self-pay

## 2024-03-27 ENCOUNTER — Other Ambulatory Visit: Payer: Self-pay | Admitting: Pharmacy Technician

## 2024-03-27 NOTE — Progress Notes (Signed)
 Disenrolled; Last filled 10/2023. & per [place] notes from 11/2023 looks like patient was planning to Move out of State & PAF.

## 2024-04-16 ENCOUNTER — Telehealth (HOSPITAL_BASED_OUTPATIENT_CLINIC_OR_DEPARTMENT_OTHER): Payer: Self-pay

## 2024-04-16 NOTE — Telephone Encounter (Signed)
 The Plan of Care form is in Dr.Ramswamy's box awaiting his signature.

## 2024-04-16 NOTE — Telephone Encounter (Signed)
 Copied from CRM [id number]. Topic: General - Other >> Apr 12, 2024  2:43 PM Margarette Shawl wrote: Reason for CRM:   Chloe, with [facility], is contacting clinic to verify if received request for Plan of Care was received on 05/28. Reviewed chart and advised no faxed were received;however, it may not have been scanned in yet. Chloe will refax the request today.   CB#  [phone]  Ext [phone]

## 2024-04-18 NOTE — Telephone Encounter (Signed)
 Signed and in leslie desk pod b

## 2024-05-07 NOTE — Telephone Encounter (Signed)
 Copied from CRM [id number]. Topic: Clinical - Order For Equipment >> May 03, 2024  2:29 PM Angela [name] wrote: Reason for CRM: Patient states she needs [name] new CPAP machine. Requesting [name] call back.   Callback number: [phone]  Spoke with patient regarding prior message . Patient stated she does get her CPAP machine from her ENT but he has retired. Dr.Ramaswamy I know you don't see patient for sleep . Patient stated she will need [name] new CPAP machine .   Dr.Ramamswamy can you please advise.

## 2024-05-16 NOTE — Telephone Encounter (Signed)
 All this will require is a referral to a sleep doctor.  As you know I do not do sleep medicine.  Please give appointment with the sleep doctor

## 2024-05-21 NOTE — Telephone Encounter (Signed)
 Spoke with patient regarding prior message . Patient has been scheduled with Tammy on 06/21/2024 patient has been advised to arrive 15 minutes prior to office visit time. Patient will bring SD card .   Patient's voice was understanding.Nothing else further needed.

## 2024-06-10 DIAGNOSIS — R051 Acute cough: Secondary | ICD-10-CM | POA: Diagnosis not present

## 2024-06-10 DIAGNOSIS — Z03818 Encounter for observation for suspected exposure to other biological agents ruled out: Secondary | ICD-10-CM | POA: Diagnosis not present

## 2024-06-10 DIAGNOSIS — I10 Essential (primary) hypertension: Secondary | ICD-10-CM | POA: Diagnosis not present

## 2024-06-13 DIAGNOSIS — E785 Hyperlipidemia, unspecified: Secondary | ICD-10-CM | POA: Diagnosis not present

## 2024-06-13 DIAGNOSIS — J439 Emphysema, unspecified: Secondary | ICD-10-CM | POA: Diagnosis not present

## 2024-06-13 DIAGNOSIS — F330 Major depressive disorder, recurrent, mild: Secondary | ICD-10-CM | POA: Diagnosis not present

## 2024-06-13 DIAGNOSIS — E1122 Type 2 diabetes mellitus with diabetic chronic kidney disease: Secondary | ICD-10-CM | POA: Diagnosis not present

## 2024-06-21 ENCOUNTER — Ambulatory Visit (HOSPITAL_BASED_OUTPATIENT_CLINIC_OR_DEPARTMENT_OTHER): Admitting: Adult Health

## 2024-06-21 ENCOUNTER — Ambulatory Visit (HOSPITAL_BASED_OUTPATIENT_CLINIC_OR_DEPARTMENT_OTHER)

## 2024-06-21 ENCOUNTER — Encounter (HOSPITAL_BASED_OUTPATIENT_CLINIC_OR_DEPARTMENT_OTHER): Payer: Self-pay | Admitting: Adult Health

## 2024-06-21 VITALS — BP 148/58 | HR 52 | Ht 67.0 in | Wt 173.0 lb

## 2024-06-21 DIAGNOSIS — J449 Chronic obstructive pulmonary disease, unspecified: Secondary | ICD-10-CM

## 2024-06-21 DIAGNOSIS — R634 Abnormal weight loss: Secondary | ICD-10-CM | POA: Diagnosis not present

## 2024-06-21 DIAGNOSIS — J8410 Pulmonary fibrosis, unspecified: Secondary | ICD-10-CM

## 2024-06-21 DIAGNOSIS — G4733 Obstructive sleep apnea (adult) (pediatric): Secondary | ICD-10-CM | POA: Diagnosis not present

## 2024-06-21 DIAGNOSIS — J84112 Idiopathic pulmonary fibrosis: Secondary | ICD-10-CM | POA: Diagnosis not present

## 2024-06-21 DIAGNOSIS — J984 Other disorders of lung: Secondary | ICD-10-CM | POA: Diagnosis not present

## 2024-06-21 DIAGNOSIS — Z87891 Personal history of nicotine dependence: Secondary | ICD-10-CM

## 2024-06-21 MED ORDER — BEVESPI AEROSPHERE 9-4.8 MCG/ACT IN AERO: 2.0000 | INHALATION_SPRAY | Freq: Two times a day (BID) | RESPIRATORY_TRACT | 5 refills | Status: AC

## 2024-06-21 NOTE — Progress Notes (Signed)
 @Patient  ID: Angela [name], female    DOB: [birth year], 78 y.o.   MRN: [id number]  Chief Complaint  Patient presents with   Establish Care    New sleep     Referring provider: Verena Mems, MD  HPI: 78 yo female former smoker followed for COPD with COPD with Emphysema, MZ phenotype with low Alpha 1 on replacement therapy and IPF (unable to tolerate Esbriet ) Medical history significant for OSA on CPAP  Covid 19 infection in 2021 and 2022   TEST/EVENTS :  High-resolution CT chest November 2020 showed stable ILD changes, probable UIP since 2018, emphysema   Pulmonary function test February 14, 2018 FEV1 72%, ratio 78, FVC 70%, and 41%   PFTs August 23, 2021 FEV1 83%, ratio 80, DLCO 58%   Trial of Esbriet  in the past unable to tolerate due to GI side effects   High-res CT chest October 2022 shows ILD changes with probable UIP minimal progression, severe emphysema, severe tracheobronchomalacia  HRCT Chest 08/2023 Pulmonary fibrosis slight progression , probable UIP , Emphysema   06/21/2024 Follow up : COPD w/ Emphysema, IPF, Alpha 1 MZ phenotype-on replacement , OSA Discussed the use of AI scribe software for clinical note transcription with the patient, who gave verbal consent to proceed.  History of Present Illness Angela [name] is a 78 year old female with moderate sleep apnea who presents for CPAP management and COPD, IPF   She has been using a CPAP machine since 1999 for her sleep apnea. Her current CPAP machine, which is self-titrating, has been giving error messages that motor life is near end for the past three years. She has not been receiving supplies for her CPAP and needs a new machine. Her last sleep study was in December 2015, which showed moderate sleep apnea with an AHI of 23.7.  She is concerned about her ability to sleep without the CPAP, stating 'if this thing goes out, I cannot sleep.'  Patient has an SD card but was unable to get a download.  We have  contacted her DME company [employer] and they have requested her to bring in her machine for a download.  Once download is obtained I have explained to her that we will send order for a new CPAP machine.  Her breathing is generally good but is affected by weather conditions such as rain and snow. She uses Bevespi  twice a day and has an albuterol  inhaler or nebulizer for as-needed use. She continues to receive Prolastin infusions weekly. She experienced a recent exacerbation of her breathing issues two weeks ago, for which she was prescribed prednisone  and a Z-Pak. She notes improvement but feels that some symptoms persist.  She has experienced significant weight loss since last fall, dropping from 211 pounds to between 167 and 170 pounds. She is unsure of the cause of this weight loss. She also reports discomfort with certain bras due to irritation at the bottom of her sternum, which she attributes to the weight loss.  She coughs frequently and needs to keep candy with her to manage it.  Cough is dry in nature.  No fever or discolored mucus.  No hemoptysis.      Allergies  Allergen Reactions   Tizanidine  Other (See Comments)    Severe decrease in BP    Influenza Vaccines Other (See Comments)    becomes sick with the flu   Other     PATIENT STATES SHE CANNOT TAKE ANY VACCINATIONS PER HER PHYSICIAN  Vicodin Hp [Hydrocodone-Acetaminophen ] Nausea And Vomiting   Betadine [Povidone Iodine] Rash   Ciprofloxacin Rash   Codeine Nausea And Vomiting   Iohexol      Desc: Pt states several years ago during a CT scan w/ iv cotnrast she had severe nausea and vomiting, sob w/ difficulty breathing and swallowing.  She had full 13hr. premeds today (5/14/7) and did fine w/o complications.     Immunization History  Administered Date(s) Administered   Influenza, High Dose Seasonal PF 09/13/2017, 08/14/2020   Moderna Sars-Covid-2 Vaccination 12/16/2019, 01/13/2020, 09/22/2020, 06/09/2021   Pneumococcal  Conjugate-13 10/15/2014   Pneumococcal-Unspecified 11/14/2012   Tdap 09/05/2016, 12/21/2022   Zoster, Live 07/16/2021    Past Medical History:  Diagnosis Date   Allergy    Alpha 1-antitrypsin PiMS phenotype    Anemia 2014   mild   Anxiety    Arthritis    Asthma    Blood transfusion without reported diagnosis    Cataract    Chronic kidney disease    early stages of some insufficiency   Complication of anesthesia    passes out 24 hrs after getting   COPD (chronic obstructive pulmonary disease) (HCC)    DDD (degenerative disc disease)    Depression    with anxiety   Diabetes mellitus    Diverticulitis    Diverticulosis    Esophageal dysmotility 03/01/06   mild   Fibromyalgia    GERD (gastroesophageal reflux disease)    Glaucoma    Heart murmur    years ago   Hypercholesterolemia    Hypertension    IPF (idiopathic pulmonary fibrosis) (HCC)    Shingles    Shortness of breath dyspnea    Sleep apnea    wears CPAP   Stroke (HCC) 2010   3 tia's    Tobacco History: Social History   Tobacco Use  Smoking Status Former   Current packs/day: 0.00   Average packs/day: 1 pack/day for 46.4 years (46.4 ttl pk-yrs)   Types: Cigarettes   Start date: 39   Quit date: 04/14/2012   Years since quitting: 12.1  Smokeless Tobacco Never   Counseling given: Not Answered   Outpatient Medications Prior to Visit  Medication Sig Dispense Refill   acetaminophen  (TYLENOL ) 500 MG tablet Take 500 mg by mouth every 6 (six) hours as needed.     albuterol  (PROVENTIL ) (2.5 MG/3ML) 0.083% nebulizer solution Take 3 mLs (2.5 mg total) by nebulization every 6 (six) hours as needed for wheezing or shortness of breath. 90 mL 5   Bacillus Coagulans-Inulin (PROBIOTIC-PREBIOTIC PO) Take 2 tablets by mouth in the morning.     benzonatate (TESSALON) 100 MG capsule Take 100 mg by mouth 3 (three) times daily as needed.     brimonidine (ALPHAGAN) 0.15 % ophthalmic solution Place 1 drop into both eyes in  the morning and at bedtime.  3   buPROPion  (WELLBUTRIN  XL) 300 MG 24 hr tablet Take 300 mg by mouth in the morning.     Camphor-Eucalyptus-Menthol  (VICKS VAPORUB) 4.73-1.2-2.6 % OINT Apply 1 application topically at bedtime.     cyclobenzaprine  (FLEXERIL ) 10 MG tablet Take 10 mg by mouth at bedtime as needed for muscle spasms.   0   dimethicone (REMEDY NUTRASHIELD) 1 % cream Apply 1 application topically 2 (two) times daily as needed for dry skin.     Dupilumab  (DUPIXENT ) 300 MG/2ML SOAJ Inject 300 mg into the skin every 14 (fourteen) days. **loading dose received in clinic on 07/25/23** 12 mL  1   Glycopyrrolate -Formoterol  (BEVESPI  AEROSPHERE) 9-4.8 MCG/ACT AERO Inhale 2 puffs into the lungs 2 (two) times daily. 10.7 g 5   hydrochlorothiazide  (HYDRODIURIL ) 12.5 MG tablet Take 12.5 mg by mouth every morning.     latanoprost (XALATAN) 0.005 % ophthalmic solution Place 1 drop into the left eye at bedtime. Left eye     losartan  (COZAAR ) 50 MG tablet Take 50 mg by mouth in the morning.     meclizine  (ANTIVERT ) 12.5 MG tablet Take 1 tablet (12.5 mg total) by mouth 3 (three) times daily as needed for dizziness. 16 tablet 0   Menthol  (ICY HOT) 5 % PTCH Place 1 patch onto the skin daily as needed (back pain.).     Menthol , Topical Analgesic, (BIOFREEZE EX) Apply 1 application topically 3 (three) times daily as needed (back pain.).     metFORMIN  (GLUCOPHAGE -XR) 500 MG 24 hr tablet Take 1,000 mg by mouth in the morning and at bedtime.  11   metoprolol  succinate (TOPROL -XL) 25 MG 24 hr tablet TAKE 1 TABLET(25 MG) BY MOUTH DAILY 90 tablet 1   Multiple Vitamin (MULTIVITAMIN WITH MINERALS) TABS tablet Take 1 tablet by mouth in the morning.     omeprazole  (PRILOSEC) 40 MG capsule TAKE 1 CAPSULE(40 MG) BY MOUTH TWICE DAILY BEFORE LUNCH AND SUPPER 90 capsule 3   OVER THE COUNTER MEDICATION Take 1 capsule by mouth daily as needed (congestion). Serrapeptase Capsules     PROLASTIN-C  1000 MG/20ML SOLN Inject 1,000 mg  into the vein every Tuesday.     sertraline  (ZOLOFT ) 50 MG tablet Take 50 mg by mouth in the morning.     vitamin B-12 (CYANOCOBALAMIN ) 1000 MCG tablet Take 1,000 mcg by mouth in the morning.     No facility-administered medications prior to visit.     Review of Systems:   Constitutional:  +weight loss, no night sweats,  Fevers, chills, fatigue, or  lassitude.  HEENT:   No headaches,  Difficulty swallowing,  Tooth/dental problems, or  Sore throat,                No sneezing, itching, ear ache, nasal congestion, post nasal drip,   CV:  No chest pain,  Orthopnea, PND, swelling in lower extremities, anasarca, dizziness, palpitations, syncope.   GI  No heartburn, indigestion, abdominal pain, nausea, vomiting, diarrhea, change in bowel habits, loss of appetite, bloody stools.   Resp:   No wheezing.  No chest wall deformity  Skin: no rash or lesions.  GU: no dysuria, change in color of urine, no urgency or frequency.  No flank pain, no hematuria   MS:  No joint pain or swelling.  No decreased range of motion.  No back pain.    Physical Exam  Pulse (!) 52   Ht 5' 7 (1.702 m)   Wt 173 lb (78.5 kg)   LMP  (LMP Unknown)   SpO2 97%   BMI 27.10 kg/m   GEN: A/Ox3; pleasant , NAD, well nourished    HEENT:  [place]/AT,  NOSE-clear, THROAT-clear, no lesions, no postnasal drip or exudate noted.   NECK:  Supple w/ fair ROM; no JVD; normal carotid impulses w/o bruits; no thyromegaly or nodules palpated; no lymphadenopathy.    RESP  BB crackles no accessory muscle use, no dullness to percussion  CARD:  RRR, no m/r/g, no peripheral edema, pulses intact, no cyanosis or clubbing.  GI:   Soft & nt; nml bowel sounds; no organomegaly or masses detected.   [name]: Warm  bil, no deformities or joint swelling noted.   Neuro: alert, no focal deficits noted.    Skin: Warm, no lesions or rashes    Lab Results:  CBC   ProBNP No results found for: PROBNP  Imaging: No results  found.  Administration History     None          Latest Ref Rng & Units 08/23/2021    1:33 PM 02/14/2018    9:14 AM 09/13/2017    9:05 AM 06/08/2017    8:48 AM 08/16/2016   10:21 AM 03/04/2016   12:53 PM 08/28/2015    4:37 PM  PFT Results  FVC-Pre L 2.60  2.44  2.45  2.63  2.69  2.79  2.81   FVC-Predicted Pre % 78  70  70  75  76  79  79   FVC-Post L      2.84  2.75   FVC-Predicted Post %      81  77   Pre FEV1/FVC % % 80  78  77  75  79  75  75   Post FEV1/FCV % %      77  79   FEV1-Pre L 2.09  1.89  1.90  1.97  2.11  2.10  2.10   FEV1-Predicted Pre % 83  72  72  74  79  78  77   FEV1-Post L      2.20  2.16   DLCO uncorrected ml/min/mmHg 12.64  12.16  12.31  13.31  14.10  14.76  15.56   DLCO UNC% % 58  41  42  45  48  50  53   DLCO corrected ml/min/mmHg 12.64   12.35  13.43  13.90  15.20    DLCO COR %Predicted % 58   42  46  47  52    DLVA Predicted % 80  64  68  63  61  66  70   TLC L      4.85  4.11   TLC % Predicted %      86  73   RV % Predicted %      84  59     No results found for: NITRICOXIDE      Assessment & Plan:   No problem-specific Assessment & Plan notes found for this encounter.  Assessment and Plan Assessment & Plan Obstructive sleep apnea   Obstructive sleep apnea has been managed with CPAP since 1999. Her current CPAP machine is auto-titrating but needs a compliance report download to order a new machine. Endorses good compliance and perceived benefit. The last sleep study in December 2015 showed moderate sleep apnea with an AHI of 23.7. She cannot sleep without CPAP and uses it diligently. Contact [employer] to obtain a CPAP download. Order a new CPAP machine upon receipt of the compliance report. Advise using distilled water  for CPAP and maintaining cleanliness. If the download is not possible, a new sleep study will be required.   Chronic obstructive pulmonary disease with underlying alpha-1 antitrypsin deficiency  (MZ w/ low level) Chronic  obstructive pulmonary disease is managed with Bevespi  twice daily, albuterol  inhaler or nebulizer as needed,  Continue on weekly Prolastin infusions. Symptoms worsen in high moisture conditions like rain or snow. A recent exacerbation was treated with prednisone  and azithromycin , resulting in improvement. Continue Bevespi  twice daily, albuterol  inhaler or nebulizer as needed, and weekly Prolastin infusions. Schedule a follow-up in three months with a pulmonary function  test.  Unintentional weight loss   She has experienced significant weight loss from 211 lbs to 167-170 lbs since last fall, possibly contributing to discomfort with front closure bras. Reports irritation at the bottom of the sternum, possibly related to weight loss and bra use. Order a chest x-ray to evaluate underlying causes of weight loss.Keep follow up with PCP   IPF with Chronic cough   Previously intolerant to Esbriet . Check Chest xray today. Check PFT on return.    Plan  Patient Instructions  Continue on CPAP At bedtime   Wear all night long  CPAP download -once available will send order for new CPAP. Take CPAP to [employer] [facility] for download .  Chest xray today   Continue on Bivespi 2 puffs Twice daily   Albuterol  inhaler/neb As needed    Continue on weekly Prolastin infusion weekly.   Follow-up in 3 months with PFT and As needed       Madelin Stank, NP 06/21/2024

## 2024-06-21 NOTE — Patient Instructions (Addendum)
 Continue on CPAP At bedtime   Wear all night long  CPAP download -once available will send order for new CPAP. Take CPAP to [facility] for download .  Chest xray today   Continue on Bivespi 2 puffs Twice daily   Albuterol  inhaler/neb As needed    Continue on weekly Prolastin infusion weekly.   Follow-up in 3 months with PFT and As needed

## 2024-06-27 ENCOUNTER — Telehealth (HOSPITAL_BASED_OUTPATIENT_CLINIC_OR_DEPARTMENT_OTHER): Payer: Self-pay | Admitting: Pharmacist

## 2024-06-27 NOTE — Telephone Encounter (Signed)
 Submitted a Prior Authorization RENEWAL request to [employer] for PROLASTIN via CoverMyMeds. Will update once we receive a response.  Key: [id number]

## 2024-06-28 ENCOUNTER — Ambulatory Visit: Payer: Self-pay | Admitting: Adult Health

## 2024-06-28 NOTE — Telephone Encounter (Signed)
 Received a fax regarding Prior Authorization from [facility] for PROLASTIN. Authorization has been DENIED because Medication authorization requires the following: (1) One of the following: (A) Pi*ZZ, Pi*Z(null) or Pi*(null)(null) protein phenotypes (homozygous). (B) Other rare alpha-1 antitrypsin disease genotypes associated with pre-treatment serum alpha1- antitrypsin level less than 11 micromole/L [for example: Pi(Malton, Malton), Pi(SZ)]. (2) One of the following: (A) Circulating pre-treatment serum of alpha1-antitrypsin level less than 11 micromole/L (which corresponds to less than 80mg /dL if measured by radial immunodiffusion or less than 57mg /dL if measured by nephelometry). (B) You have a concomitant diagnosis of necrotizing panniculitis.SABRA  Reference #: [id number]  Pharmacy team will work on appeal  Sherry Pennant, PharmD, MPH, BCPS, CPP Clinical Pharmacist (Rheumatology and Pulmonology)

## 2024-06-28 NOTE — Telephone Encounter (Signed)
 Submitted an URGENT appeal to [facility] for PROLASTIN.  Reference #: [id number] Phone: [phone] Fax: [phone]  Sherry Pennant, PharmD, MPH, BCPS, CPP Clinical Pharmacist (Rheumatology and Pulmonology)

## 2024-07-01 ENCOUNTER — Telehealth (HOSPITAL_BASED_OUTPATIENT_CLINIC_OR_DEPARTMENT_OTHER): Payer: Self-pay

## 2024-07-01 NOTE — Telephone Encounter (Addendum)
 Appeal has been submitted. Left VM with Davina advising of appeal  Sherry Pennant, PharmD, MPH, BCPS, CPP Clinical Pharmacist (Rheumatology and Pulmonology)

## 2024-07-01 NOTE — Telephone Encounter (Signed)
 Copied from CRM #[id number]. Topic: General - Other >> Jun 28, 2024  3:58 PM Rilla B wrote: Reason for CRM: Davina from [facility] checking in on appeal for Prolastin. Davina [phone] option #9 then Ext [phone]

## 2024-07-02 ENCOUNTER — Telehealth: Payer: Self-pay | Admitting: *Deleted

## 2024-07-02 NOTE — Telephone Encounter (Signed)
 Received fax from [facility] regarding an expedited appeal for Prolastin-C .  Pharmacist has already submitted the appeal.  Nothing further needed.

## 2024-07-05 ENCOUNTER — Other Ambulatory Visit (HOSPITAL_COMMUNITY): Payer: Self-pay

## 2024-07-05 NOTE — Telephone Encounter (Signed)
 Ran test claim for Prolastin in [facility].  Test claim shows copay is $0. Have not yet received approval letter  Called Davina @ [facility] to advise of approval. She will send message to BIV team  Phone: [phone], option #9 then Ext [phone]   Sherry Pennant, PharmD, MPH, BCPS, CPP Clinical Pharmacist (Rheumatology and Pulmonology)

## 2024-07-05 NOTE — Addendum Note (Signed)
 Addended by: DAYNE SHERRY RAMAN on: 07/05/2024 03:27 PM   Modules accepted: Orders

## 2024-07-31 ENCOUNTER — Telehealth: Payer: Self-pay

## 2024-07-31 NOTE — Telephone Encounter (Signed)
 Copied from CRM #[id number]. Topic: General - Other >> Jul 31, 2024  9:10 AM Isabell A wrote: Reason for CRM: Delon with [facility] calling in regard to home health fax that requires signature - it expires on 9/19, re-sent over this morning it is a three page nursing order, all three pages needs to be signed.   Callback number: [phone]  Fax number: [phone]    Tried to reach out to jennifer of [facility]  - VM/LM   We did received the fax,  Provider will not be in clinic until 09/23     -NFN

## 2024-08-06 NOTE — Telephone Encounter (Signed)
 Copied from CRM #[id number]. Topic: General - Other >> Jul 31, 2024  9:10 AM Isabell A wrote: Reason for CRM: Delon with [facility] calling in regard to home health fax that requires signature - it expires on 9/19, re-sent over this morning it is a three page nursing order, all three pages needs to be signed.   Callback number: [phone]  Fax number: [phone] >> Aug 06, 2024  8:58 AM Leila BROCKS wrote: Delon from [facility] [phone] has an expired plan of care 08/02/24 for patient, needs 3 pages signed today it's urgent, and patient has a nurse visit tomorrow. Delon asked to speak to [facility] on this matter. Per CAL, warm transferred to CMA.   Will be signed today.

## 2024-08-07 ENCOUNTER — Telehealth: Payer: Self-pay | Admitting: Adult Health

## 2024-08-07 NOTE — Telephone Encounter (Signed)
 Copied from CRM #[id number]. Topic: Medical Record Request - Records Request >> Aug 07, 2024 10:28 AM Devaughn RAMAN wrote: Reason for CRM:  Delon with [facility] called to regarding Plan of Care for patient, it will expire on 9/19 and the provider is out of office until 9/23. Delon stated it is urgent and it needs to be signed by today and faxed back, all 3 pages need to be signed in order for the patient's office visit to be covered.  Phone-[phone] Fax-[phone]

## 2024-08-07 NOTE — Telephone Encounter (Signed)
 I have checked Tammy's box and paperwork and do not find the forms that are being requested.  I attempted to call Delon with [facility], however had to leave a message.

## 2024-08-08 ENCOUNTER — Telehealth: Payer: Self-pay

## 2024-08-08 NOTE — Telephone Encounter (Signed)
 Paperwork for [facility] has been faxed to [employer]

## 2024-08-08 NOTE — Telephone Encounter (Signed)
 Forms faxed to [facility]  Received confirmation  Placed in scan folder

## 2024-09-11 ENCOUNTER — Emergency Department (HOSPITAL_COMMUNITY)
Admission: EM | Admit: 2024-09-11 | Discharge: 2024-09-11 | Disposition: A | Discharge status: Home / Self Care | Attending: Emergency Medicine | Admitting: Emergency Medicine

## 2024-09-11 ENCOUNTER — Encounter (HOSPITAL_COMMUNITY): Payer: Self-pay | Admitting: Emergency Medicine

## 2024-09-11 ENCOUNTER — Other Ambulatory Visit: Payer: Self-pay

## 2024-09-11 ENCOUNTER — Emergency Department (HOSPITAL_COMMUNITY)

## 2024-09-11 DIAGNOSIS — Z7984 Long term (current) use of oral hypoglycemic drugs: Secondary | ICD-10-CM | POA: Insufficient documentation

## 2024-09-11 DIAGNOSIS — Z79899 Other long term (current) drug therapy: Secondary | ICD-10-CM | POA: Insufficient documentation

## 2024-09-11 DIAGNOSIS — J449 Chronic obstructive pulmonary disease, unspecified: Secondary | ICD-10-CM | POA: Insufficient documentation

## 2024-09-11 DIAGNOSIS — E119 Type 2 diabetes mellitus without complications: Secondary | ICD-10-CM | POA: Insufficient documentation

## 2024-09-11 DIAGNOSIS — I10 Essential (primary) hypertension: Secondary | ICD-10-CM | POA: Insufficient documentation

## 2024-09-11 LAB — CBC WITH DIFFERENTIAL/PLATELET
Abs Immature Granulocytes: 0.03 K/uL (ref 0.00–0.07)
Basophils Absolute: 0.1 K/uL (ref 0.0–0.1)
Basophils Relative: 1 %
Eosinophils Absolute: 0.3 K/uL (ref 0.0–0.5)
Eosinophils Relative: 3 %
HCT: 39.3 % (ref 36.0–46.0)
Hemoglobin: 12.5 g/dL (ref 12.0–15.0)
Immature Granulocytes: 0 %
Lymphocytes Relative: 28 %
Lymphs Abs: 2.9 K/uL (ref 0.7–4.0)
MCH: 27.7 pg (ref 26.0–34.0)
MCHC: 31.8 g/dL (ref 30.0–36.0)
MCV: 86.9 fL (ref 80.0–100.0)
Monocytes Absolute: 0.7 K/uL (ref 0.1–1.0)
Monocytes Relative: 6 %
Neutro Abs: 6.4 K/uL (ref 1.7–7.7)
Neutrophils Relative %: 62 %
Platelets: 401 K/uL — ABNORMAL HIGH (ref 150–400)
RBC: 4.52 MIL/uL (ref 3.87–5.11)
RDW: 13.5 % (ref 11.5–15.5)
WBC: 10.3 K/uL (ref 4.0–10.5)
nRBC: 0 % (ref 0.0–0.2)

## 2024-09-11 LAB — BASIC METABOLIC PANEL WITH GFR
Anion gap: 15 (ref 5–15)
BUN: 17 mg/dL (ref 8–23)
CO2: 22 mmol/L (ref 22–32)
Calcium: 9.1 mg/dL (ref 8.9–10.3)
Chloride: 99 mmol/L (ref 98–111)
Creatinine, Ser: 1.02 mg/dL — ABNORMAL HIGH (ref 0.44–1.00)
GFR, Estimated: 57 mL/min — ABNORMAL LOW (ref >60)
Glucose, Bld: 99 mg/dL (ref 70–99)
Potassium: 4.5 mmol/L (ref 3.5–5.1)
Sodium: 136 mmol/L (ref 135–145)

## 2024-09-11 LAB — TROPONIN I (HIGH SENSITIVITY)
Troponin I (High Sensitivity): 6 ng/L (ref <18)
Troponin I (High Sensitivity): 5 ng/L (ref <18)

## 2024-09-11 LAB — MAGNESIUM: Magnesium: 2 mg/dL (ref 1.7–2.4)

## 2024-09-11 MED ORDER — AMLODIPINE BESYLATE 5 MG PO TABS
5.0000 mg | ORAL_TABLET | Freq: Once | ORAL | Status: AC
  Administered 2024-09-11: 5 mg via ORAL
  Filled 2024-09-11: qty 1

## 2024-09-11 MED ORDER — AMLODIPINE BESYLATE 5 MG PO TABS: 5.0000 mg | ORAL_TABLET | Freq: Every day | ORAL | 0 refills | Status: DC

## 2024-09-11 MED ORDER — AMLODIPINE BESYLATE 5 MG PO TABS: 5.0000 mg | ORAL_TABLET | Freq: Every day | ORAL | 0 refills | Status: AC

## 2024-09-11 NOTE — ED Provider Notes (Signed)
 [place] EM[facility]GENCY DEPARTMENT AT [facility] Provider Note   CSN: [id number] Arrival date & time: 09/11/24  1052     Patient presents with: Hypertension   Angela [name] is a 78 y.o. female.    Hypertension     Patient has a history of diabetes, hypertension, fibromyalgia, COPD, diverticulosis, idiopathic pulmonary fibrosis.  Patient presents ED for evaluation of hypertension.  Patient states she has been having trouble with her blood pressure being elevated over the last couple of weeks.  She saw her primary care doctor and her losartan  was increased to 100 mg/day.  She has been taking this for about a week or so now and it has not really helped.  Today she measured blood pressures as high as 240 systolic this worried her so she came to the ED for evaluation.  Patient has not had any trouble with any chest pain.  No numbness or weakness  Prior to Admission medications   Medication Sig Start Date End Date Taking? Authorizing Provider  amLODipine  (NORVASC ) 5 MG tablet Take 1 tablet (5 mg total) by mouth daily. 09/11/24  Yes Randol Simmonds, MD  acetaminophen  (TYLENOL ) 500 MG tablet Take 500 mg by mouth every 6 (six) hours as needed.    [provider]  albuterol  (PROVENTIL ) (2.5 MG/3ML) 0.083% nebulizer solution Take 3 mLs (2.5 mg total) by nebulization every 6 (six) hours as needed for wheezing or shortness of breath. 05/01/23   Parrett, Madelin RAMAN, NP  Bacillus Coagulans-Inulin (PROBIOTIC-PREBIOTIC PO) Take 2 tablets by mouth in the morning.    [provider]  benzonatate (TESSALON) 100 MG capsule Take 100 mg by mouth 3 (three) times daily as needed. 04/28/23   [provider]  brimonidine (ALPHAGAN) 0.15 % ophthalmic solution Place 1 drop into both eyes in the morning and at bedtime. 07/12/17   [provider]  buPROPion  (WELLBUTRIN  XL) 300 MG 24 hr tablet Take 300 mg by mouth in the morning. 05/04/16   [provider]   Camphor-Eucalyptus-Menthol  (VICKS VAPORUB) 4.73-1.2-2.6 % OINT Apply 1 application topically at bedtime.    [provider]  dimethicone (REMEDY NUTRASHIELD) 1 % cream Apply 1 application topically 2 (two) times daily as needed for dry skin.    [provider]  Glycopyrrolate -Formoterol  (BEVESPI  AEROSPHERE) 9-4.8 MCG/ACT AERO Inhale 2 puffs into the lungs 2 (two) times daily. 06/21/24   Parrett, Madelin RAMAN, NP  hydrochlorothiazide  (HYDRODIURIL ) 12.5 MG tablet Take 12.5 mg by mouth every morning. 08/30/21   [provider]  latanoprost (XALATAN) 0.005 % ophthalmic solution Place 1 drop into the left eye at bedtime. Left eye    [provider]  losartan  (COZAAR ) 50 MG tablet Take 50 mg by mouth in the morning. 11/24/19   [provider]  meclizine  (ANTIVERT ) 12.5 MG tablet Take 1 tablet (12.5 mg total) by mouth 3 (three) times daily as needed for dizziness. 12/16/18   Palumbo, April, MD  Menthol  (ICY HOT) 5 % PTCH Place 1 patch onto the skin daily as needed (back pain.).    [provider]  Menthol , Topical Analgesic, (BIOFREEZE EX) Apply 1 application topically 3 (three) times daily as needed (back pain.).    [provider]  metFORMIN  (GLUCOPHAGE -XR) 500 MG 24 hr tablet Take 1,000 mg by mouth in the morning and at bedtime. 01/25/18   [provider]  metoprolol  succinate (TOPROL -XL) 25 MG 24 hr tablet TAKE 1 TABLET(25 MG) BY MOUTH DAILY 05/22/17   Jeffrie Anes  C, MD  Multiple Vitamin (MULTIVITAMIN WITH MINERALS) TABS tablet Take 1 tablet by mouth in the morning.    [provider]  omeprazole  (PRILOSEC) 40 MG capsule TAKE 1 CAPSULE(40 MG) BY MOUTH TWICE DAILY BEFORE LUNCH AND SUPPER 05/04/20   Mansouraty, Aloha Raddle., MD  OVER THE COUNTER MEDICATION Take 1 capsule by mouth daily as needed (congestion). Serrapeptase Capsules    [provider]  PROLASTIN-C  1000 MG/20ML SOLN Inject 1,000 mg into the vein every Tuesday.  09/14/21   [provider]  sertraline  (ZOLOFT ) 50 MG tablet Take 50 mg by mouth in the morning.    [provider]  vitamin B-12 (CYANOCOBALAMIN ) 1000 MCG tablet Take 1,000 mcg by mouth in the morning.    [provider]    Allergies: Tizanidine , Influenza vaccines, Other, Vicodin hp [hydrocodone-acetaminophen ], Betadine [povidone iodine], Ciprofloxacin, Codeine, and Iohexol    Review of Systems  Updated Vital Signs BP (!) 213/65 (BP Location: Left Arm)   Pulse (!) 51   Temp 98 F (36.7 C)   Resp 18   LMP  (LMP Unknown)   SpO2 95%   Physical Exam Vitals and nursing note reviewed.  Constitutional:      General: She is not in acute distress.    Appearance: She is well-developed.  HENT:     Head: Normocephalic and atraumatic.     Right Ear: External ear normal.     Left Ear: External ear normal.  Eyes:     General: No scleral icterus.       Right eye: No discharge.        Left eye: No discharge.     Conjunctiva/sclera: Conjunctivae normal.  Neck:     Trachea: No tracheal deviation.  Cardiovascular:     Rate and Rhythm: Normal rate and regular rhythm.  Pulmonary:     Effort: Pulmonary effort is normal. No respiratory distress.     Breath sounds: Normal breath sounds. No stridor. No wheezing or rales.  Abdominal:     General: Bowel sounds are normal. There is no distension.     Palpations: Abdomen is soft.     Tenderness: There is no abdominal tenderness. There is no guarding or rebound.  Musculoskeletal:        General: No tenderness or deformity.     Cervical back: Neck supple.  Skin:    General: Skin is warm and dry.     Findings: No rash.  Neurological:     General: No focal deficit present.     Mental Status: She is alert.     Cranial Nerves: No cranial nerve deficit, dysarthria or facial asymmetry.     Sensory: No sensory deficit.     Motor: No abnormal muscle tone or seizure activity.     Coordination: Coordination normal.   Psychiatric:        Mood and Affect: Mood normal.     (all labs ordered are listed, but only abnormal results are displayed) Labs Reviewed  BASIC METABOLIC PANEL WITH GFR - Abnormal; Notable for the following components:      Result Value   Creatinine, Ser 1.02 (*)    GFR, Estimated 57 (*)    All other components within normal limits  CBC WITH DIFFERENTIAL/PLATELET - Abnormal; Notable for the following components:   Platelets 401 (*)    All other components within normal limits  MAGNESIUM   TROPONIN I (HIGH SENSITIVITY)  TROPONIN I (HIGH SENSITIVITY)    EKG: None  Radiology:  DG Chest 2 View Result Date: 09/11/2024 CLINICAL DATA:  Chest pain. EXAM: CHEST - 2 VIEW COMPARISON:  Chest radiograph dated 06/21/2024. FINDINGS: Extensive interstitial coarsening and fibrosis. No new consolidation. There is no pleural effusion pneumothorax. The cardiac silhouette is within normal limits. No acute osseous pathology. IMPRESSION: 1. No acute cardiopulmonary process. 2. Pulmonary fibrosis. Electronically Signed   By: Vanetta Chou M.D.   On: 09/11/2024 13:33     Procedures   Medications Ordered in the ED  amLODipine  (NORVASC ) tablet 5 mg (has no administration in time range)    Clinical Course as of 09/11/24 1506  Wed Sep 11, 2024  1443 Troponin I (High Sensitivity) Troponin normal.  Metabolic panel normal.  CBC normal [JK]  1443 Chest x-ray without acute findings [JK]  1451 Losartan  100 mg, hydrochlorothiazide  12.5, metoprolol  25 mg [JK]    Clinical Course User Index [JK] Randol Simmonds, MD                                 Medical Decision Making Amount and/or Complexity of Data Reviewed Labs:  Decision-making details documented in ED Course.  Risk Prescription drug management.   Patient presented to the ED for evaluation of asymptomatic hypertension.  Patient's blood pressure was notably elevated as an outpatient.  In the ED she is still 214/65.  Patient does not have any  signs of focal neurologic deficits.  She is not having any cardiac discomfort.  Patient's ED workup is reassuring.  No significant metabolic abnormalities.  No signs of cardiac injury.  The patient is already on the maximum dose of losartan .  She is taking metoprolol  but is bradycardic at baseline so I hesitate increasing her dose.  Will give her a dose of Norvasc  here in the emergency room and give her a prescription for that.  Recommend continued outpatient follow-up with her PCP regarding her hypertension     Final diagnoses:  Hypertension, unspecified type    ED Discharge Orders          Ordered    amLODipine  (NORVASC ) 5 MG tablet  Daily        09/11/24 1505               Randol Simmonds, MD 09/11/24 [phone]

## 2024-09-11 NOTE — ED Notes (Signed)
 Pt brought back to triage to obtain 2nd troponin

## 2024-09-11 NOTE — Discharge Instructions (Signed)
 Continue your other medications for your blood pressure.  Start taking the amlodipine  in addition.  Follow-up with your primary care doctor to have your blood pressure rechecked.  Return to the ER for chest pain, numbness, weakness or other concerning symptoms

## 2024-09-11 NOTE — ED Triage Notes (Signed)
 Pt here for HTN x 2 weeks.  PCP increased Losartan  but it hasn't helped. Pt has experienced some nausea.

## 2024-09-11 NOTE — ED Provider Triage Note (Signed)
 Emergency Medicine Provider Triage Evaluation Note  Angela [name] , a 78 y.o. female  was evaluated in triage.  Pt complains of elevated BP. States she has no symptoms from this but checked it last night because she couldn't sleep and BP was high. States she has been compliant with her medications   Review of Systems  Positive: Elevated BP Negative: Chest pain, sob, headache  Physical Exam  BP (!) 214/65 (BP Location: Right Arm)   Pulse (!) 54   Temp 97.6 F (36.4 C) (Oral)   Resp 15   LMP  (LMP Unknown)   SpO2 96%  Gen:   Awake, no distress   Resp:  Normal effort  MSK:   Moves extremities without difficulty    Medical Decision Making  Medically screening exam initiated at 11:23 AM.  Appropriate orders placed.  Ronal JAYSON Sharps was informed that the remainder of the evaluation will be completed by another provider, this initial triage assessment does not replace that evaluation, and the importance of remaining in the ED until their evaluation is complete.     Gennaro Duwaine CROME, DO 09/11/24 1123

## 2024-09-16 ENCOUNTER — Encounter

## 2024-09-16 ENCOUNTER — Ambulatory Visit: Admitting: Adult Health

## 2024-10-29 ENCOUNTER — Telehealth: Payer: Self-pay

## 2024-10-29 NOTE — Telephone Encounter (Signed)
 Received fax with request for Prolastin Rx request.   Stamped and returned with current medication list.   Fax # [phone]  Of note, no-show to last OV in November 2025. Routing to scheduling team for assistance.

## 2024-11-04 NOTE — Telephone Encounter (Signed)
 Attempted to call patient. Left voicemail for patient to call office tog et scheduled for PFT and follow up with Tammy NP.

## 2024-11-04 NOTE — Telephone Encounter (Signed)
 Copied from CRM #[id number]. Topic: Clinical - Medical Advice >> Oct 30, 2024 10:29 AM Rozanna MATSU wrote: Reason for CRM: Delon with [facility] has faxed over nursing orders 3x and stated this is going to expire on 12/19, she will be refaxing to the alt fax number now. Thanks >> Oct 30, 2024 10:31 AM Rozanna MATSU wrote: Jennifer's contact number is [phone]  ------------------------------------------------------------------------------------------------------------------------------------------------  11/04/24 ATC Delon left detailed message that per our clinical pharmacist the request has been sent back and to please return our call to let us  know they have received it.  Will await return call.  Delon works until [employer].

## 2024-11-05 NOTE — Telephone Encounter (Signed)
 Received call back from [facility], she reports they received the nursing order that was needed on 11/04/24. NFN.  Will await PA response.

## 2024-11-05 NOTE — Telephone Encounter (Signed)
 Renewed Rx was faxed on 10/29/24, can be viewed in media tab.   ATC Jennifer at [phone]. LVM requesting return call.

## 2024-11-05 NOTE — Telephone Encounter (Signed)
 Called [facility] to investigate what is needed since I was unable to get in touch with [facility].   Spoke with Brittany, pharmacy with [facility]. Per pharmacist, they received the fax sent 10/29/24. Needs PA. Per Brittany, the nursing orders were faxed to [phone] - these orders never made it to pharmacy team. Requested the RE-fax RN orders to [phone] today so that I can complete.    Submitted a Prior Authorization request to [employer] for PROLASTIN via CoverMyMeds. Will update once we receive a response.  Key: [id number]

## 2024-11-05 NOTE — Telephone Encounter (Signed)
 Copied from CRM [id number]. Topic: Clinical - Prescription Issue >> Nov 01, 2024  5:15 PM Angela [name] wrote: Reason for CRM: [place] [phone] opt 5, ext# [phone] with [facility] called to proved the cover my meds keycode [id number] for PROLASTIN-C  1000 MG/20ML SOLN, stated she faxed paperwork on 12/18. Thanks  Routing to pharmacy.

## 2024-11-08 NOTE — Telephone Encounter (Signed)
 Received notification from [facility] regarding a prior authorization for PROLASTIN. Authorization has been APPROVED from 11/05/2024 to 11/13/2025. Approval letter sent to scan center.  Authorization # [id number]  Sherry Pennant, PharmD, MPH, BCPS, CPP Clinical Pharmacist
